# Patient Record
Sex: Female | Born: 1935
Health system: Southern US, Community
[De-identification: ages and names within clinical notes are randomized; demographics above are authoritative.]

## PROBLEM LIST (undated history)

## (undated) DIAGNOSIS — E785 Hyperlipidemia, unspecified: Secondary | ICD-10-CM

## (undated) DIAGNOSIS — Z950 Presence of cardiac pacemaker: Secondary | ICD-10-CM

## (undated) DIAGNOSIS — E039 Hypothyroidism, unspecified: Secondary | ICD-10-CM

## (undated) DIAGNOSIS — I2510 Atherosclerotic heart disease of native coronary artery without angina pectoris: Secondary | ICD-10-CM

## (undated) DIAGNOSIS — R001 Bradycardia, unspecified: Secondary | ICD-10-CM

## (undated) DIAGNOSIS — K219 Gastro-esophageal reflux disease without esophagitis: Secondary | ICD-10-CM

## (undated) HISTORY — DX: Hyperlipidemia, unspecified: E78.5

## (undated) HISTORY — PX: CORONARY STENT INTERVENTION: CATH118234

## (undated) HISTORY — PX: APPENDECTOMY: SHX54

## (undated) HISTORY — PX: CHOLECYSTECTOMY: SHX55

## (undated) HISTORY — PX: OTHER SURGICAL HISTORY: SHX169

---

## 1965-11-16 HISTORY — PX: BREAST BIOPSY: SHX20

## 2002-05-03 ENCOUNTER — Emergency Department (HOSPITAL_COMMUNITY): Admission: EM | Admit: 2002-05-03 | Discharge: 2002-05-03 | Payer: Self-pay | Admitting: Emergency Medicine

## 2002-05-03 ENCOUNTER — Encounter: Payer: Self-pay | Admitting: Emergency Medicine

## 2005-04-21 ENCOUNTER — Ambulatory Visit: Payer: Self-pay | Admitting: Internal Medicine

## 2005-08-29 ENCOUNTER — Emergency Department: Payer: Self-pay | Admitting: Emergency Medicine

## 2005-09-05 ENCOUNTER — Emergency Department: Payer: Self-pay | Admitting: General Practice

## 2006-06-25 ENCOUNTER — Ambulatory Visit: Payer: Self-pay | Admitting: Internal Medicine

## 2006-09-01 ENCOUNTER — Ambulatory Visit: Payer: Self-pay | Admitting: Unknown Physician Specialty

## 2007-06-28 ENCOUNTER — Ambulatory Visit: Payer: Self-pay | Admitting: Internal Medicine

## 2008-01-03 ENCOUNTER — Ambulatory Visit: Payer: Self-pay | Admitting: Otolaryngology

## 2008-06-28 ENCOUNTER — Ambulatory Visit: Payer: Self-pay | Admitting: Internal Medicine

## 2008-07-13 ENCOUNTER — Emergency Department: Payer: Self-pay | Admitting: Emergency Medicine

## 2008-07-21 ENCOUNTER — Emergency Department: Payer: Self-pay | Admitting: Emergency Medicine

## 2008-08-24 ENCOUNTER — Ambulatory Visit: Payer: Self-pay | Admitting: Internal Medicine

## 2009-07-05 ENCOUNTER — Ambulatory Visit: Payer: Self-pay | Admitting: Internal Medicine

## 2009-07-11 ENCOUNTER — Ambulatory Visit: Payer: Self-pay | Admitting: Internal Medicine

## 2009-07-18 ENCOUNTER — Ambulatory Visit: Payer: Self-pay | Admitting: Internal Medicine

## 2009-08-19 ENCOUNTER — Ambulatory Visit: Payer: Self-pay | Admitting: Urology

## 2009-09-13 ENCOUNTER — Ambulatory Visit: Payer: Self-pay | Admitting: Unknown Physician Specialty

## 2009-10-03 ENCOUNTER — Ambulatory Visit: Payer: Self-pay | Admitting: Obstetrics and Gynecology

## 2009-10-14 ENCOUNTER — Ambulatory Visit: Payer: Self-pay | Admitting: Obstetrics and Gynecology

## 2010-07-22 ENCOUNTER — Ambulatory Visit: Payer: Self-pay | Admitting: Internal Medicine

## 2010-11-11 ENCOUNTER — Ambulatory Visit: Payer: Self-pay | Admitting: Internal Medicine

## 2011-07-17 ENCOUNTER — Inpatient Hospital Stay (HOSPITAL_COMMUNITY): Payer: Medicare Other

## 2011-07-17 ENCOUNTER — Inpatient Hospital Stay (HOSPITAL_COMMUNITY)
Admission: EM | Admit: 2011-07-17 | Discharge: 2011-07-19 | DRG: 247 | Disposition: A | Discharge status: Home / Self Care | Payer: Medicare Other | Source: Ambulatory Visit | Attending: Interventional Cardiology | Admitting: Interventional Cardiology

## 2011-07-17 DIAGNOSIS — Z794 Long term (current) use of insulin: Secondary | ICD-10-CM

## 2011-07-17 DIAGNOSIS — I2510 Atherosclerotic heart disease of native coronary artery without angina pectoris: Secondary | ICD-10-CM | POA: Diagnosis present

## 2011-07-17 DIAGNOSIS — F17200 Nicotine dependence, unspecified, uncomplicated: Secondary | ICD-10-CM | POA: Diagnosis present

## 2011-07-17 DIAGNOSIS — E785 Hyperlipidemia, unspecified: Secondary | ICD-10-CM | POA: Diagnosis present

## 2011-07-17 DIAGNOSIS — E039 Hypothyroidism, unspecified: Secondary | ICD-10-CM | POA: Diagnosis present

## 2011-07-17 DIAGNOSIS — D649 Anemia, unspecified: Secondary | ICD-10-CM | POA: Diagnosis present

## 2011-07-17 DIAGNOSIS — I2582 Chronic total occlusion of coronary artery: Secondary | ICD-10-CM | POA: Diagnosis present

## 2011-07-17 DIAGNOSIS — Z79899 Other long term (current) drug therapy: Secondary | ICD-10-CM

## 2011-07-17 DIAGNOSIS — I959 Hypotension, unspecified: Secondary | ICD-10-CM | POA: Diagnosis present

## 2011-07-17 DIAGNOSIS — I2119 ST elevation (STEMI) myocardial infarction involving other coronary artery of inferior wall: Secondary | ICD-10-CM

## 2011-07-17 DIAGNOSIS — E119 Type 2 diabetes mellitus without complications: Secondary | ICD-10-CM | POA: Diagnosis present

## 2011-07-17 DIAGNOSIS — Z7982 Long term (current) use of aspirin: Secondary | ICD-10-CM

## 2011-07-17 LAB — CARDIAC PANEL(CRET KIN+CKTOT+MB+TROPI)
CK, MB: 44.7 ng/mL (ref 0.3–4.0)
Relative Index: 6.7 — ABNORMAL HIGH (ref 0.0–2.5)
Total CK: 666 U/L — ABNORMAL HIGH (ref 7–177)
Troponin I: 15.61 ng/mL (ref <0.30)

## 2011-07-17 LAB — DIFFERENTIAL
Basophils Absolute: 0 10*3/uL (ref 0.0–0.1)
Basophils Relative: 0 % (ref 0–1)
Eosinophils Absolute: 0 10*3/uL (ref 0.0–0.7)
Eosinophils Relative: 0 % (ref 0–5)
Lymphocytes Relative: 7 % — ABNORMAL LOW (ref 12–46)
Lymphs Abs: 0.7 10*3/uL (ref 0.7–4.0)
Monocytes Absolute: 0.5 10*3/uL (ref 0.1–1.0)
Monocytes Relative: 4 % (ref 3–12)
Neutro Abs: 9.7 10*3/uL — ABNORMAL HIGH (ref 1.7–7.7)
Neutrophils Relative %: 89 % — ABNORMAL HIGH (ref 43–77)

## 2011-07-17 LAB — CBC
HCT: 33.6 % — ABNORMAL LOW (ref 36.0–46.0)
Hemoglobin: 11.4 g/dL — ABNORMAL LOW (ref 12.0–15.0)
MCH: 30.8 pg (ref 26.0–34.0)
MCHC: 33.9 g/dL (ref 30.0–36.0)
MCV: 90.8 fL (ref 78.0–100.0)
Platelets: 273 10*3/uL (ref 150–400)
RBC: 3.7 MIL/uL — ABNORMAL LOW (ref 3.87–5.11)
RDW: 12.9 % (ref 11.5–15.5)
WBC: 10.9 10*3/uL — ABNORMAL HIGH (ref 4.0–10.5)

## 2011-07-17 LAB — POCT I-STAT, CHEM 8
BUN: 22 mg/dL (ref 6–23)
Calcium, Ion: 1.11 mmol/L — ABNORMAL LOW (ref 1.12–1.32)
Chloride: 103 mEq/L (ref 96–112)
Creatinine, Ser: 1.1 mg/dL (ref 0.50–1.10)
Glucose, Bld: 567 mg/dL (ref 70–99)
HCT: 33 % — ABNORMAL LOW (ref 36.0–46.0)
Hemoglobin: 11.2 g/dL — ABNORMAL LOW (ref 12.0–15.0)
Potassium: 4.9 mEq/L (ref 3.5–5.1)
Sodium: 132 mEq/L — ABNORMAL LOW (ref 135–145)
TCO2: 15 mmol/L (ref 0–100)

## 2011-07-17 LAB — GLUCOSE, CAPILLARY
Glucose-Capillary: 587 mg/dL (ref 70–99)
Glucose-Capillary: 596 mg/dL (ref 70–99)
Glucose-Capillary: 308 mg/dL — ABNORMAL HIGH (ref 70–99)
Glucose-Capillary: 112 mg/dL — ABNORMAL HIGH (ref 70–99)

## 2011-07-17 LAB — POCT ACTIVATED CLOTTING TIME: Activated Clotting Time: 331 seconds

## 2011-07-17 LAB — MRSA PCR SCREENING: MRSA by PCR: NEGATIVE

## 2011-07-17 LAB — TSH: TSH: 0.884 u[IU]/mL (ref 0.350–4.500)

## 2011-07-18 LAB — HEMOGLOBIN A1C
Hgb A1c MFr Bld: 9 % — ABNORMAL HIGH (ref <5.7)
Mean Plasma Glucose: 212 mg/dL — ABNORMAL HIGH (ref <117)

## 2011-07-18 LAB — CARDIAC PANEL(CRET KIN+CKTOT+MB+TROPI)
CK, MB: 37.3 ng/mL (ref 0.3–4.0)
CK, MB: 17.9 ng/mL (ref 0.3–4.0)
Relative Index: 4.1 — ABNORMAL HIGH (ref 0.0–2.5)
Relative Index: 1.9 (ref 0.0–2.5)
Total CK: 909 U/L — ABNORMAL HIGH (ref 7–177)
Total CK: 932 U/L — ABNORMAL HIGH (ref 7–177)
Troponin I: >25 ng/mL (ref <0.30)
Troponin I: 13.9 ng/mL (ref <0.30)

## 2011-07-18 LAB — BASIC METABOLIC PANEL
BUN: 16 mg/dL (ref 6–23)
CO2: 24 mEq/L (ref 19–32)
Calcium: 7.9 mg/dL — ABNORMAL LOW (ref 8.4–10.5)
Chloride: 103 mEq/L (ref 96–112)
Creatinine, Ser: 0.9 mg/dL (ref 0.50–1.10)
GFR calc Af Amer: >60 mL/min (ref >60)
GFR calc non Af Amer: >60 mL/min (ref >60)
Glucose, Bld: 258 mg/dL — ABNORMAL HIGH (ref 70–99)
Potassium: 3.8 mEq/L (ref 3.5–5.1)
Sodium: 135 mEq/L (ref 135–145)

## 2011-07-18 LAB — CBC
HCT: 30 % — ABNORMAL LOW (ref 36.0–46.0)
Hemoglobin: 10.3 g/dL — ABNORMAL LOW (ref 12.0–15.0)
MCH: 30.7 pg (ref 26.0–34.0)
MCHC: 34.3 g/dL (ref 30.0–36.0)
MCV: 89.3 fL (ref 78.0–100.0)
Platelets: 252 10*3/uL (ref 150–400)
RBC: 3.36 MIL/uL — ABNORMAL LOW (ref 3.87–5.11)
RDW: 13 % (ref 11.5–15.5)
WBC: 12.5 10*3/uL — ABNORMAL HIGH (ref 4.0–10.5)

## 2011-07-18 LAB — GLUCOSE, CAPILLARY
Glucose-Capillary: 219 mg/dL — ABNORMAL HIGH (ref 70–99)
Glucose-Capillary: 255 mg/dL — ABNORMAL HIGH (ref 70–99)
Glucose-Capillary: 300 mg/dL — ABNORMAL HIGH (ref 70–99)
Glucose-Capillary: 252 mg/dL — ABNORMAL HIGH (ref 70–99)

## 2011-07-18 LAB — LIPID PANEL
Cholesterol: 100 mg/dL (ref 0–200)
HDL: 53 mg/dL (ref >39)
LDL Cholesterol: 38 mg/dL (ref 0–99)
Total CHOL/HDL Ratio: 1.9 RATIO
Triglycerides: 47 mg/dL (ref <150)
VLDL: 9 mg/dL (ref 0–40)

## 2011-07-19 LAB — CBC
HCT: 32.8 % — ABNORMAL LOW (ref 36.0–46.0)
Hemoglobin: 11 g/dL — ABNORMAL LOW (ref 12.0–15.0)
MCH: 30.4 pg (ref 26.0–34.0)
MCHC: 33.5 g/dL (ref 30.0–36.0)
MCV: 90.6 fL (ref 78.0–100.0)
Platelets: 224 10*3/uL (ref 150–400)
RBC: 3.62 MIL/uL — ABNORMAL LOW (ref 3.87–5.11)
RDW: 13.4 % (ref 11.5–15.5)
WBC: 5.5 10*3/uL (ref 4.0–10.5)

## 2011-07-19 LAB — BASIC METABOLIC PANEL
BUN: 14 mg/dL (ref 6–23)
CO2: 28 mEq/L (ref 19–32)
Calcium: 8.5 mg/dL (ref 8.4–10.5)
Chloride: 107 mEq/L (ref 96–112)
Creatinine, Ser: 0.58 mg/dL (ref 0.50–1.10)
GFR calc Af Amer: >60 mL/min (ref >60)
GFR calc non Af Amer: >60 mL/min (ref >60)
Glucose, Bld: 114 mg/dL — ABNORMAL HIGH (ref 70–99)
Potassium: 3.8 mEq/L (ref 3.5–5.1)
Sodium: 141 mEq/L (ref 135–145)

## 2011-07-19 LAB — GLUCOSE, CAPILLARY
Glucose-Capillary: 79 mg/dL (ref 70–99)
Glucose-Capillary: 100 mg/dL — ABNORMAL HIGH (ref 70–99)
Glucose-Capillary: 316 mg/dL — ABNORMAL HIGH (ref 70–99)

## 2011-07-21 ENCOUNTER — Telehealth: Payer: Self-pay | Admitting: Cardiology

## 2011-07-21 NOTE — Telephone Encounter (Signed)
Pharmacy calling wanting clarificaiton on Chantix dosage. Please return call to clarify.

## 2011-07-22 NOTE — Telephone Encounter (Signed)
Called Jimmy at pharmacy with Chantix directions.

## 2011-07-22 NOTE — Telephone Encounter (Signed)
Chantix dosing:  0.5 mg Days 1-3.  Day 4-7 0.5 mg bid;  Week 5 to 12:  1 mg bid.

## 2011-07-23 ENCOUNTER — Telehealth: Payer: Self-pay | Admitting: Cardiology

## 2011-07-23 NOTE — Telephone Encounter (Signed)
Pt daughter calling. Pt heart medication, brilinta 90mg  not covered by pt insurance, [employer]. Pt daughter wants to know if there is something pt can substitute that insurance would pay for. Please return call to discuss further.

## 2011-07-23 NOTE — Telephone Encounter (Signed)
According to D/C summary pt is a pt of Dr Dietrich Pates and is to follow up with Dr Tenny Craw.  Will forward to her for review and recommendations.

## 2011-07-23 NOTE — Telephone Encounter (Signed)
Called patient's daughter and she was not there so I spoke with the patient. She states that her insurance company will not cover Brilinta. She has a 1 month supply that she used a coupon to obtain free of charge. Advised her that I would discuss with Dr.Ross about changing to another medication and let her know. Called [facility] and they don't know what will be covered until we actually order medication.

## 2011-07-27 NOTE — H&P (Signed)
NAMERAMAYA, GUILE NO.:  [id number]  MEDICAL RECORD NO.:  [id number]  LOCATION:  2906                         FACILITY:  [facility]  PHYSICIAN:  Pricilla Riffle, MD, FACCDATE OF BIRTH:  November 16, 1934  DATE OF ADMISSION:  07/17/2011 DATE OF DISCHARGE:                             HISTORY & PHYSICAL   IDENTIFICATION:  The patient is a 77 year old who was admitted with chest pain, inferior ST-elevation MI.  HISTORY OF PRESENT ILLNESS:  The patient has no known history of coronary artery disease.  She said this morning about 2-3 hours prior to EMS being called, she developed chest pain, pressure associated with nausea and vomiting.  EMS was called.  EKG showed inferior ST elevation. She was transported to [facility].  En route, she was given 700 mL of fluid IV as well as Zofran.  She was cool and clammy per the report of EMS.  Blood pressure palpable.  Currently, status post intervention, she denies chest pain.  Breathing is okay.  She says prior to today, she has not had any episodes of chest pain.  She is not that active.  ALLERGIES:  DARVON.  MEDICATIONS AT HOME:  The patient on: 1. Lantus 16 units, it appears, at bedtime (records pending from Dr.     Judithann Sheen' office). 2. Pravastatin 40. 3. Synthroid 75 mcg. 4. Aspirin if she remembers. 5. Claritin 10 mg 1 time per day.  PAST MEDICAL HISTORY: 1. Diabetes for 30 years.  She reports glucose is relatively good     control. 2. Hypothyroidism. 3. Dyslipidemia. 4. Pain in the legs.  SOCIAL HISTORY:  The patient is married.  She smokes a pack per day x30 years.  Does not drink.  FAMILY HISTORY:  Significant for coronary artery disease in the mother.  REVIEW OF SYSTEMS:  No fevers, chills, occasional headaches that her bad.  No shortness of breath.  No PND.  Yesterday, her glucose was running in the 500s.  She did not feel good.  GU:  Negative.  GI: Nausea and vomiting.  No diarrhea.  Bowels moving okay.   Otherwise, all systems reviewed and negative to the above problem except as noted above.  PHYSICAL EXAMINATION:  GENERAL:  On exam, the patient is in no acute distress, status post intervention. VITAL SIGNS:  Blood pressure 95-110 systolic/60s, pulse 70s and regular (sinus rhythm), temperature is 98, O2 sat on 2 L of 100%. HEENT:  Normocephalic, atraumatic.  EOMI, PERRL. NECK:  JVP is normal.  No thyromegaly, no bruits. LUNGS:  Clear to auscultation without rales or wheezes anteriorly. CARDIAC:  Regular rate and rhythm.  S1, S2.  No S3, S4, or murmurs. ABDOMEN:  Supple, nontender.  Normal bowel sounds.  No hepatomegaly. EXTREMITIES:  Good distal pulses throughout.  No lower extremity edema. NEUROLOGIC:  Alert and oriented x3.  Cranial nerves II through XII grossly intact.  Chest x-ray pending.  A 12-lead EKG, sinus bradycardia.  2:1 AV block. ST elevation in leads II, III and F.  Q-waves V1, V2.  EKG #2, sinus rhythm, 78.  Q-waves V1, V2; ST depression V5, V6.  No R-waves present inferiorly.  LABORATORY DATA:  Pending.  IMPRESSION: 1.  A 77 year old woman with inferior ST-elevation myocardial     infarction, now status post Percutaneous transluminal coronary     angioplasty with Promus stent to the right coronary artery.  She     had a left main that was widely patent.  Circumflex was a large     vessel with mild irregularities.  Left anterior descending artery     was a medium-to-large vessel, irregularities.  First diagonal was     large and widely patent, second diagonal and third diagonal were     small, patent.  Right coronary artery again was occluded     proximally.  It was a dominant vessel when opened.  Patent ductus     arteriosus had a 25% ostial lesion, left ventricular ejection     fraction was normal at 60%.  The patient underwent percutaneous     transluminal coronary angioplasty, had aspiration catheter placed,     Promus stent was placed.  Plan was for aspirin and  Brilinta for 1     year.  Hold other meds now.  Follow blood pressure, fluids, and     dopamine as needed.  We will begin other meds as blood pressure     tolerates. 2. Dyslipidemia.  We will start Crestor 40, check lipids in a.m. 3. Diabetes get records from Dr. Judithann Sheen, check hemoglobin A1c, sliding     scale insulin moderate, begin 16 units Lantus p.m. 4. Hypothyroidism.  Check TSH.  Continue Synthroid. 5. Tobacco.  Counseled on cessation. 6. We will begin Protonix empirically.     Pricilla Riffle, MD, [facility]     PVR/MEDQ  D:  07/17/2011  T:  07/17/2011  Job:  [id number]  Electronically Signed by Dietrich Pates MD [facility] on 07/27/2011 06:09:50 AM

## 2011-07-27 NOTE — H&P (Signed)
  NAMEBRAD, MCGAUGHY NO.:  [id number]  MEDICAL RECORD NO.:  [id number]  LOCATION:  2906                         FACILITY:  [facility]  PHYSICIAN:  Pricilla Riffle, MD, FACCDATE OF BIRTH:  [birth year]  DATE OF ADMISSION:  07/17/2011 DATE OF DISCHARGE:                             HISTORY & PHYSICAL   IDENTIFICATION:  The patient is a 77 year old with no known history of coronary artery disease, presents with ST elevation MI.  HISTORY OF PRESENT ILLNESS:  The patient reported this morning about chest pain that began 2-3 hours ago.  EMS was called.  On their arrival, found on EKG to have ST elevation inferiorly.  Noted to have nausea and vomiting.  Pulse was palpable.  She was given Zofran IV, IV fluids (700 mL), and aspirin, and transported to [facility] for further treatment.  ALLERGIES:  Darvon.  MEDICATIONS:  Per EMS, insulin, dose unknown; pravastatin 40; Synthroid 75 mcg.  PAST MEDICAL HISTORY: 1. Diabetes. 2. Hypothyroidism. 3. Dyslipidemia.  SOCIAL HISTORY:  Not obtained as the patient undergoing catheterization.  FAMILY HISTORY:  Not obtained.  REVIEW OF SYSTEMS:  Not reviewed, noted above positive though.  PHYSICAL EXAMINATION:  Dictation ended at this point.     Pricilla Riffle, MD, [facility]     PVR/MEDQ  D:  07/17/2011  T:  07/17/2011  Job:  [id number]  Electronically Signed by Dietrich Pates MD [facility] on 07/27/2011 06:09:46 AM

## 2011-07-27 NOTE — Telephone Encounter (Signed)
Madeline [name] should be covered.  Started in setting of STEMI with PCI/Stent placement.   Get her samples for now.  Then need insurance info. She cannot miss doses.

## 2011-08-04 NOTE — Telephone Encounter (Signed)
Called patient's insurance company at 1 [phone] ID# [id number] and requested a pre authorization form for Brilinta 90mg  BID. They will fax form for MD to complete and then we will send back to them.

## 2011-08-11 ENCOUNTER — Ambulatory Visit: Payer: No Typology Code available for payment source | Admitting: Internal Medicine

## 2011-08-12 ENCOUNTER — Encounter: Payer: Self-pay | Admitting: Internal Medicine

## 2011-08-12 NOTE — Cardiovascular Report (Signed)
[name], [name] NO.:  [id number]  MEDICAL RECORD NO.:  [id number]  LOCATION:  2906                         FACILITY:  [facility]  PHYSICIAN:  Corky Crafts, MDDATE OF BIRTH:  01-25-1934  DATE OF PROCEDURE:  07/17/2011 DATE OF DISCHARGE:                           CARDIAC CATHETERIZATION   PRIMARY CARE PHYSICIAN:  Dr. Judithann Sheen.  PRIMARY CARDIOLOGIST:  Pricilla Riffle, MD, [facility]  PROCEDURES PERFORMED: 1. Left heart catheterization. 2. Left ventriculogram. 3. Coronary angiogram.  OPERATOR:  Corky Crafts, MD  INDICATIONS:  Inferior ST elevation MI.  PROCEDURE NARRATIVE:  The patient was brought emergently to the cath lab due to the inferior ST elevation.  She was prepped and draped in usual sterile fashion.  Her right groin was infiltrated with 1% lidocaine.  A 6-French sheath was placed into the right common femoral vein using the modified Seldinger technique.  A 6-French sheath was placed into the right common femoral artery using modified Seldinger technique.  Left coronary artery angiography was performed using JL-4.0 pigtail catheter. The catheter was advanced in to the vessel ostium under fluoroscopic guidance.  Digital angiography was performed in multiple projections using hand injection of contrast.  Right coronary artery angiography was performed using a JR-4 guiding catheter.  Angiomax was used for anticoagulation.  The PCI was then performed.  Please see below for details.  After the PCI, a pigtail catheter was advanced in to the left ventricle under fluoroscopic guidance.  Power injection of contrast was performed in the RAO projection to image the left ventricle.  Catheter was pulled back under continuous hemodynamic pressure monitoring.  A femoral angiogram was performed but her femoral artery was not suitable for closure.  Manual compression will be used for hemostasis.  FINDINGS: 1. The left main is widely patent. 2. The left  circumflex is a large vessel.  There are mild luminal     irregularities.  The OM-1, OM-2, and OM-3 are all small vessels but     patent.  There is a fourth OM which is medium sized and widely     patent. 3. Left anterior descending is a medium-to-large vessel which reached     the apex.  There are mild luminal irregularities.  The first     diagonal is a large vessel which appears widely patent.  There are     2 other diagonals which are small but patent. 4. The right coronary artery is occluded proximally.  Post-PCI, we     were able to see that this was a large dominant vessel with mild     irregularities.  There were dual PDAs, the more proximal branch had     an ostial 25% stenosis. 5. Left ventriculogram after the PCI showed normal ventricular     function with an LVEF of 60-65%.  HEMODYNAMICS:  Left ventricular pressure 80/0 with an LVEDP of 6 mmHg. Aortic pressure 74/26 with a mean aortic pressure of 42.  PCI NARRATIVE:  JR-4 guiding catheter was used.  A Prowater wire was used.  Dopamine was started for hypotension, this was started at 5 mcg, was decreased to 2.5 mcg.  Her blood pressure stabilized during the procedure.  A Prowater wire was used to cross the lesion and aspiration catheter was used and there was successful removal of thrombus.  TIMI-3 flow was restored.  A 2.0 x 15 balloon was inflated to 10 atmospheres. A 2.5 x 20 Promus stent was then deployed in the disease proximal RCA to 16 atmospheres.  The stent was postdilated with a 2.75 x 15 [place] Quantum Apex balloon, inflated at 18 atmospheres twice.  There was no residual stenosis.  Initial TIMI-0 flow was improved to TIMI-3 flow.  Lesion length was 16 mm.  We were able to stop the dopamine by the end of the case as her blood pressure had stabilized.  Angiomax was continued at 0.25 mg for 2 hours.  IMPRESSION: 1. Acute inferior ST elevation myocardial infarction. 2. Normal left ventricular  function.  RECOMMENDATIONS:  Continue aspirin and Brilinta for at least a year.  If her blood sugars are greater than 500, we will get units of regular insulin to try to get her blood sugar down.  She needs aggressive secondary prevention including smoking cessation.     Corky Crafts, MD     JSV/MEDQ  D:  07/17/2011  T:  07/17/2011  Job:  [id number]  Electronically Signed by Lance Muss MD on 08/12/2011 01:11:19 PM

## 2011-08-14 ENCOUNTER — Ambulatory Visit (INDEPENDENT_AMBULATORY_CARE_PROVIDER_SITE_OTHER): Payer: Medicare Other | Admitting: Internal Medicine

## 2011-08-14 ENCOUNTER — Encounter: Payer: Self-pay | Admitting: Internal Medicine

## 2011-08-14 DIAGNOSIS — E785 Hyperlipidemia, unspecified: Secondary | ICD-10-CM

## 2011-08-14 DIAGNOSIS — I2510 Atherosclerotic heart disease of native coronary artery without angina pectoris: Secondary | ICD-10-CM | POA: Insufficient documentation

## 2011-08-14 DIAGNOSIS — I6529 Occlusion and stenosis of unspecified carotid artery: Secondary | ICD-10-CM

## 2011-08-14 DIAGNOSIS — Z720 Tobacco use: Secondary | ICD-10-CM

## 2011-08-14 DIAGNOSIS — F17200 Nicotine dependence, unspecified, uncomplicated: Secondary | ICD-10-CM

## 2011-08-14 NOTE — Assessment & Plan Note (Signed)
Good control

## 2011-08-14 NOTE — Assessment & Plan Note (Signed)
Counselled on stopping.

## 2011-08-14 NOTE — Assessment & Plan Note (Signed)
S/p PCI with DEs to RCA.  Will work at [employer], [employer] approved. Referral for cardiac rehab.  I think this is vital as patient would like to get back to caring for 2 young children.

## 2011-08-14 NOTE — Patient Instructions (Signed)
Your physician has requested that you have a carotid duplex. This test is an ultrasound of the carotid arteries in your neck. It looks at blood flow through these arteries that supply the brain with blood. Allow one hour for this exam. There are no restrictions or special instructions.  Your physician wants you to follow-up in:January 2013 with Dr.Ross You will receive a reminder letter in the mail two months in advance. If you don't receive a letter, please call our office to schedule the follow-up appointment.

## 2011-08-14 NOTE — Progress Notes (Signed)
HPI  Patient is a 77 year old who was recently admitted to [facility] with inferior STEMI.  She had occlusion of the RCA and underwent PTCA with DES to the RCA.  It was a right dominant system with dual PDAs.  Other vessels had irregularities.  She was d/cd home on Brilinta. Since D/C no chest pains.  Breathing is OK.  Walking 2x per day. Trying to quit.  6 cigs yesterday. LDL was 38 in hospital. Has dry cough, had it before coming into hospital. Allergies not on file  Current Outpatient Prescriptions  Medication Sig Dispense Refill  . aspirin 81 MG tablet Take 81 mg by mouth daily.        . citalopram (CELEXA) 20 MG tablet Take 20 mg by mouth daily.        . insulin aspart (NOVOLOG) 100 UNIT/ML injection Inject into the skin 3 (three) times daily before meals. 5 to 8 units       . insulin glargine (LANTUS) 100 UNIT/ML injection Inject 14 Units into the skin at bedtime.        Marland Kitchen levothyroxine (SYNTHROID, LEVOTHROID) 75 MCG tablet Take 75 mcg by mouth daily.        Marland Kitchen lisinopril (PRINIVIL,ZESTRIL) 5 MG tablet Take 5 mg by mouth daily.        Marland Kitchen loratadine (CLARITIN) 10 MG tablet Take 10 mg by mouth daily.        . metoprolol tartrate (LOPRESSOR) 25 MG tablet Take 25 mg by mouth 2 (two) times daily.        . nitroGLYCERIN (NITROSTAT) 0.4 MG SL tablet Place 0.4 mg under the tongue every 5 (five) minutes as needed.        . pantoprazole (PROTONIX) 40 MG tablet Take 40 mg by mouth daily.        . pravastatin (PRAVACHOL) 40 MG tablet Take 40 mg by mouth daily.        . Ticagrelor (BRILINTA) 90 MG TABS tablet Take 90 mg by mouth 2 (two) times daily.          Past Medical History  Diagnosis Date  . Diabetes mellitus   . Hyperthyroidism   . Dyslipidemia     No past surgical history on file.  No family history on file.  History   Social History  . Marital Status: Married    Spouse Name: N/A    Number of Children: N/A  . Years of Education: N/A   Occupational History  . Not on file.     Social History Main Topics  . Smoking status: Current Everyday Smoker  . Smokeless tobacco: Not on file  . Alcohol Use: Not on file  . Drug Use: Not on file  . Sexually Active: Not on file   Other Topics Concern  . Not on file   Social History Narrative  . No narrative on file    Review of Systems:  All systems reviewed.  They are negative to the above problem except as previously stated.  Vital Signs: BP 145/66  Pulse 53  Ht 5\' 2"  (1.575 m)  Wt 110 lb (49.896 kg)  BMI 20.12 kg/m2  Physical Exam  Patient in NAD.  HEENT:  Normocephalic, atraumatic. EOMI, PERRLA.  Neck: JVP is normal. No thyromegaly. No bruits.  Lungs: clear to auscultation. No rales no wheezes.  Heart: Regular rate and rhythm. Normal S1, S2. No S3.   No significant murmurs. PMI not displaced.  Abdomen:  Supple, nontender. Normal  bowel sounds. No masses. No hepatomegaly.  Extremities:   Good distal pulses throughout. No lower extremity edema.  Musculoskeletal :moving all extremities.  Neuro:   alert and oriented x3.  CN II-XII grossly intact.  EKG:  Sinus bradycardia.  53 bpm. LVH.  NOnspecific ST changes.  Assessment and Plan:

## 2011-08-27 NOTE — Discharge Summary (Signed)
NAMEJAMESHIA, [name] NO.:  [id number]  MEDICAL RECORD NO.:  [id number]  LOCATION:  3703                         FACILITY:  [facility]  PHYSICIAN:  Rollene Rotunda, MD, FACCDATE OF BIRTH:  27-Oct-1934  DATE OF ADMISSION:  07/17/2011 DATE OF DISCHARGE:  07/19/2011                              DISCHARGE SUMMARY   PRIMARY CARDIOLOGIST:  Pricilla Riffle, MD, [facility]  DISCHARGE DIAGNOSIS: 1. Status post acute inferior ST-elevation myocardial infarction.     a.     Status post emergent drug-eluting stenting of 100% proximal      right coronary artery.     b.     Residual nonobstructive coronary artery disease.     c.     Normal left ventricular function (EF 60-65%)  SECONDARY DIAGNOSES: 1. Diabetes mellitus. 2. Dyslipidemia. 3. Hypothyroidism.  REASON FOR ADMISSION:  [name] is a 77 year old female, with no known history of heart disease, who presented emergently to the cath lab, with inferior ST-elevation myocardial infarction.  HOSPITAL COURSE:  The patient underwent successful coronary angiography and emergent percutaneous intervention, by Dr. Eldridge Dace, with placement of a PROMUS drug-eluding stent, for treatment of a 100% occluded proximal right coronary artery.  There were no noted complications.  Residual anatomy yielded nonobstructive CAD.  Left ventricular function was within normal limits.  The patient did require initial treatment with dopamine for hypotension.  Subsequent hospital course was essentially benign, and the patient initiated cardiac rehab phase I.  Medication adjustments notable for initiation of Brilinta, to be continued  for at least 1 year.  The patient was also noted to have bilateral carotid bruits on examination, with recommendation to pursue outpatient carotid Dopplers.  A 2-D echo was  also initially ordered, but owing to scheduling conflicts could not be  performed prior to discharge.  The patient was anxious to leave, given  that it was her birthday.  Also of note, the patient did seem amenable to wanting to stop smoking, and recommendation was for her to be provided with a prescription for Chantix,  at time of discharge.  DISCHARGE LABORATORY DATA:  Sodium 141, potassium 3.8, BUN 14, creatinine 0.6.  WBC 5.5, hemoglobin 11, hematocrit 33, (MCV 91), platelets 224.  Hemoglobin A1c 9.0.  OUTSTANDING LABORATORY DATA:  Peak troponin I greater than 25.  Lipid profile:  Total cholesterol 100, triglyceride 47, HDL 53, and LDL 38. TSH 0.9.  ADMISSION CHEST X-RAY:  No acute cardiopulmonary process.  DISPOSITION:  Stable.  FOLLOWUP:  Dr. Huston Foley in 2 weeks, arrangements to be made through our office.  DISCHARGE HOME MEDICATIONS: 1. Aspirin 81 daily. 2. Brilinta 90 mg b.i.d. 3. Metoprolol 25 mg b.i.d. 4. Lisinopril 5 mg daily. 5. Protonix 40 daily. 6. Lantus insulin 14 units at bedtime. 7. Pravastatin 40 daily. 8. Celexa 20 daily. 9. Claritin 10 daily. 10.Synthroid 0.075 daily. 11.Chantix as directed.  DURATION OF DISCHARGE ENCOUNTER:  Greater than 30 minutes, including physician time.     Gene Serpe, PA-C   ______________________________ Rollene Rotunda, MD, [facility]    GS/MEDQ  D:  07/19/2011  T:  07/20/2011  Job:  [id number]  cc:   Dr. Judithann Sheen  Electronically Signed by  GENE SERPE PA-C on 07/21/2011 02:32:59 PM Electronically Signed by Rollene Rotunda MD [facility] on 08/27/2011 01:33:52 PM

## 2011-09-02 ENCOUNTER — Telehealth: Payer: Self-pay | Admitting: Internal Medicine

## 2011-09-02 NOTE — Telephone Encounter (Signed)
Pt calling re samples of Brilinta and will run out in 2 weeks, will rx be called in? uses [facility] [address] in [place]

## 2011-09-04 NOTE — Telephone Encounter (Signed)
Called patient and advised that [employer] has approved the Brilinta medication per [employer]. Her copay will be 39.00 per month. Medication will be ready for pick up Monday afternoon.

## 2011-09-04 NOTE — Telephone Encounter (Signed)
Per pharm. Medication approved.  See other note.

## 2011-09-09 ENCOUNTER — Other Ambulatory Visit: Payer: Self-pay | Admitting: Cardiology

## 2011-09-09 DIAGNOSIS — I6529 Occlusion and stenosis of unspecified carotid artery: Secondary | ICD-10-CM

## 2011-09-10 ENCOUNTER — Encounter (INDEPENDENT_AMBULATORY_CARE_PROVIDER_SITE_OTHER): Payer: Medicare Other | Admitting: Cardiology

## 2011-09-10 DIAGNOSIS — I6529 Occlusion and stenosis of unspecified carotid artery: Secondary | ICD-10-CM

## 2011-09-10 DIAGNOSIS — R0989 Other specified symptoms and signs involving the circulatory and respiratory systems: Secondary | ICD-10-CM

## 2011-10-15 ENCOUNTER — Telehealth: Payer: Self-pay | Admitting: Internal Medicine

## 2011-10-15 NOTE — Telephone Encounter (Signed)
Refill needed for  brilanta needs written rx

## 2011-10-22 MED ORDER — TICAGRELOR 90 MG PO TABS: 90.0000 mg | ORAL_TABLET | Freq: Two times a day (BID) | ORAL | Status: DC

## 2011-10-22 NOTE — Telephone Encounter (Signed)
Called patient and she advised me that she needs a written script for Brilinta to be mailed to her home so she can try to get into the medication program. Advised will mail out tomorrow.

## 2011-10-28 ENCOUNTER — Encounter: Payer: Self-pay | Admitting: Internal Medicine

## 2011-11-23 ENCOUNTER — Telehealth: Payer: Self-pay | Admitting: *Deleted

## 2011-11-23 NOTE — Telephone Encounter (Signed)
Called patient and left message on voice mail that the Brilinta 90mg   #180 has arrived and she can pick it up at the front desk at this office. She was advised to call me when she needs a refill about 2 weeks prior to her running out of the medication.

## 2011-12-14 ENCOUNTER — Encounter: Payer: Self-pay | Admitting: Internal Medicine

## 2011-12-14 ENCOUNTER — Ambulatory Visit (INDEPENDENT_AMBULATORY_CARE_PROVIDER_SITE_OTHER): Payer: Medicare Other | Admitting: Internal Medicine

## 2011-12-14 DIAGNOSIS — E785 Hyperlipidemia, unspecified: Secondary | ICD-10-CM

## 2011-12-14 DIAGNOSIS — F17200 Nicotine dependence, unspecified, uncomplicated: Secondary | ICD-10-CM

## 2011-12-14 DIAGNOSIS — I2510 Atherosclerotic heart disease of native coronary artery without angina pectoris: Secondary | ICD-10-CM

## 2011-12-14 DIAGNOSIS — Z720 Tobacco use: Secondary | ICD-10-CM

## 2011-12-14 NOTE — Progress Notes (Signed)
HPI Patient is a 78 year old who was recently admitted to [facility] with inferior STEMI. She had occlusion of the RCA and underwent PTCA with DES to the RCA. It was a right dominant system with dual PDAs. Other vessels had irregularities. She was d/cd home on Brilinta.   Trying to quit. 6 cigs yesterday.  LDL was 38 in hospital.   The other night had an episode of chest pressure in bed.  Went to L arm  Lasted a few minutes  None since Doing household activities.  No pain with this.  Breathing is stable.   Allergies  Allergen Reactions  . Darvon   . Sulfa Antibiotics     Current Outpatient Prescriptions  Medication Sig Dispense Refill  . aspirin 81 MG tablet Take 81 mg by mouth daily.        . citalopram (CELEXA) 20 MG tablet Take 20 mg by mouth daily.        . insulin aspart (NOVOLOG) 100 UNIT/ML injection Inject into the skin 3 (three) times daily before meals. 5 to 8 units       . insulin glargine (LANTUS) 100 UNIT/ML injection Inject 14 Units into the skin at bedtime.        Marland Kitchen levothyroxine (SYNTHROID, LEVOTHROID) 75 MCG tablet Take 75 mcg by mouth daily.        Marland Kitchen lisinopril (PRINIVIL,ZESTRIL) 5 MG tablet Take 5 mg by mouth daily.        . metoprolol tartrate (LOPRESSOR) 25 MG tablet Take 25 mg by mouth 2 (two) times daily.        . nitroGLYCERIN (NITROSTAT) 0.4 MG SL tablet Place 0.4 mg under the tongue every 5 (five) minutes as needed.        . pantoprazole (PROTONIX) 40 MG tablet Take 40 mg by mouth daily.        . pravastatin (PRAVACHOL) 40 MG tablet Take 40 mg by mouth daily.        . Ticagrelor (BRILINTA) 90 MG TABS tablet Take 1 tablet (90 mg total) by mouth 2 (two) times daily.  60 tablet  11    Past Medical History  Diagnosis Date  . Diabetes mellitus   . Hyperthyroidism   . Dyslipidemia     No past surgical history on file.  No family history on file.  History   Social History  . Marital Status: Married    Spouse Name: N/A    Number of Children: N/A  .  Years of Education: N/A   Occupational History  . Not on file.   Social History Main Topics  . Smoking status: Current Everyday Smoker  . Smokeless tobacco: Not on file  . Alcohol Use: Not on file  . Drug Use: Not on file  . Sexually Active: Not on file   Other Topics Concern  . Not on file   Social History Narrative  . No narrative on file    Review of Systems:  All systems reviewed.  They are negative to the above problem except as previously stated.  Vital Signs: BP 134/64  Pulse 54  Ht 5\' 2"  (1.575 m)  Wt 117 lb (53.071 kg)  BMI 21.40 kg/m2  Physical Exam Patient is in NAD HEENT:  Normocephalic, atraumatic. EOMI, PERRLA.  Neck: JVP is normal. No bruits.  Lungs: clear to auscultation. No rales no wheezes.  Heart: Regular rate and rhythm. Normal S1, S2. No S3.   No significant murmurs. PMI not displaced.  Abdomen:  Supple, nontender. Normal bowel sounds. No masses. No hepatomegaly.  Extremities:   Good distal pulses throughout. No lower extremity edema.  Musculoskeletal :moving all extremities.  Neuro:   alert and oriented x3.  CN II-XII grossly intact.  EKG;  SR.  61 bpm.  LVH with repolarization abnormality.  Assessment and Plan:  \

## 2011-12-14 NOTE — Patient Instructions (Signed)
Your physician wants you to follow-up in: Sept/OCt. 2013 You will receive a reminder letter in the mail two months in advance. If you don't receive a letter, please call our office to schedule the follow-up appointment.

## 2011-12-15 NOTE — Assessment & Plan Note (Signed)
Keep on statin. 

## 2011-12-15 NOTE — Assessment & Plan Note (Signed)
I am not convinced CP in bed represents angina.  Keep on same regimen.

## 2011-12-15 NOTE — Assessment & Plan Note (Signed)
Counselled on quitting. 

## 2012-02-05 ENCOUNTER — Other Ambulatory Visit: Payer: Self-pay | Admitting: Cardiology

## 2012-02-09 ENCOUNTER — Other Ambulatory Visit: Payer: Self-pay | Admitting: Cardiology

## 2012-02-19 ENCOUNTER — Other Ambulatory Visit: Payer: Self-pay | Admitting: Cardiology

## 2012-02-19 ENCOUNTER — Telehealth: Payer: Self-pay | Admitting: Internal Medicine

## 2012-02-19 NOTE — Telephone Encounter (Signed)
Pt needs to reorder brilinta , pls call when it comes in [phone]

## 2012-02-19 NOTE — Telephone Encounter (Signed)
I have called and been transferred all over the place  I do not know where to call as there is no documentation of where this medication is coming from.  I have called [facility] and [facility].     I called the patient and let her know I would send this to Dr Charlott Rakes nurse.  She will be back on Thurs and will know where to send.  She has 1 bottle left.  The pt could not find a number to call either.

## 2012-02-25 NOTE — Telephone Encounter (Signed)
Marden Noble comes from [employer] Patient Assistance Program. Will have Dr.Ross sign this form on 4/12 and will fax back to [phone].

## 2012-02-26 NOTE — Telephone Encounter (Signed)
Called patient to let her know that Dr.Ross signed form for Brilinta and it was faxed to [facility]. Will call her when medication arrives.

## 2012-03-14 ENCOUNTER — Telehealth: Payer: Self-pay | Admitting: *Deleted

## 2012-03-14 ENCOUNTER — Other Ambulatory Visit: Payer: Self-pay | Admitting: *Deleted

## 2012-03-14 NOTE — Telephone Encounter (Signed)
Pt needs brilanta refilled--advised i would reorder--left samples at front desk for pt to pic up--nt

## 2012-03-14 NOTE — Telephone Encounter (Signed)
I called [employer] at [phone] and was advised by a customer service agent that patient's application expired on 11/16/2011 and that she was sent a letter that notified her of this information. They will mail a new application to her home address that she needs to complete.  I called the patient and explained that she had to reapply. She verbalized understanding.

## 2012-03-14 NOTE — Telephone Encounter (Signed)
Pt calling stating she's running out of brilanta and has not received her order from [employer]--she states she has enough to last until 03/17/12--advised i would put some samples  at front desk and Madeline [name] states she ordered this med on 02/26/12, but it has not arrived--Madeline [name] states she will look at the paperwork she faxed [employer] and figure out problem--nt

## 2012-03-22 ENCOUNTER — Telehealth: Payer: Self-pay | Admitting: Internal Medicine

## 2012-03-22 NOTE — Telephone Encounter (Signed)
Called patient back. She states that she did send in application for Brilinta program but has not heard back yet. Samples of Brilinta left for her to pick up at the front desk.

## 2012-03-22 NOTE — Telephone Encounter (Signed)
Please return call to patient regarding Brulenta Samples. She can be reached at [phone].

## 2012-04-04 ENCOUNTER — Telehealth: Payer: Self-pay | Admitting: Internal Medicine

## 2012-04-04 NOTE — Telephone Encounter (Signed)
New msg Pt was calling about med. Brilinta. She had some questions. Please call

## 2012-04-04 NOTE — Telephone Encounter (Signed)
Spoke with pt, she is waiting for supply from the company. Pt made aware not a the front desk, she still has enough for about one week. Will forward for dr Tenny Craw nurse review. The pt will call back if she needs samples prior to drugs getting here.

## 2012-04-14 NOTE — Telephone Encounter (Signed)
Called [employer] and rep at [phone] advised me that her new application is being processed and that medication should be sent to our office very soon. I called the patient and left a message on her voice mail with this information.

## 2012-04-25 ENCOUNTER — Telehealth: Payer: Self-pay | Admitting: Internal Medicine

## 2012-04-25 NOTE — Telephone Encounter (Signed)
New Problem:    Patient called in wanting to know if there were any samples of Brilinta available for her to have.  Please call back.

## 2012-04-25 NOTE — Telephone Encounter (Signed)
24 days of samples were left at the front desk.  Pt was notified.

## 2012-04-27 MED ORDER — TICAGRELOR 90 MG PO TABS: 90.0000 mg | ORAL_TABLET | Freq: Two times a day (BID) | ORAL | Status: DC

## 2012-04-27 NOTE — Telephone Encounter (Signed)
New msg Pt assistance program needs rx to process brilinta fax number is [phone]

## 2012-04-27 NOTE — Telephone Encounter (Signed)
RX printed and faxed to number given

## 2012-04-27 NOTE — Telephone Encounter (Signed)
Left message that RX was faxed

## 2012-05-09 ENCOUNTER — Ambulatory Visit: Payer: Self-pay | Admitting: Internal Medicine

## 2012-05-13 ENCOUNTER — Telehealth: Payer: Self-pay | Admitting: *Deleted

## 2012-05-13 NOTE — Telephone Encounter (Signed)
Called patient to follow up on Brilinta medication and she advised me that the company has mailed 3 bottles to her home.

## 2012-06-06 ENCOUNTER — Telehealth: Payer: Self-pay | Admitting: Internal Medicine

## 2012-06-06 NOTE — Telephone Encounter (Signed)
Walk In pt Form " pt Dropped Off [employer]&[place] papers for Completion" Sent to [facility]

## 2012-06-13 ENCOUNTER — Telehealth: Payer: Self-pay | Admitting: *Deleted

## 2012-06-13 NOTE — Telephone Encounter (Signed)
LMOM that [employer] form is ready to pick up and is left out front in a file.

## 2012-06-28 ENCOUNTER — Telehealth: Payer: Self-pay | Admitting: Internal Medicine

## 2012-06-28 NOTE — Telephone Encounter (Signed)
Patient called because she is sick at her stomach. Pt took her BP left arm 121/61, pulse 56 beats/minute right arm BP 123/66, pulse 58 beats/minute. Patient is aware that her BP and pulse is normal, she needs to call her PCP for an appointment for further evaluation. Pt verbalized understanding.

## 2012-06-28 NOTE — Telephone Encounter (Signed)
New msg Pt wants to talk to you about her BP 121/61 hr 56. Please call

## 2012-08-25 ENCOUNTER — Ambulatory Visit (INDEPENDENT_AMBULATORY_CARE_PROVIDER_SITE_OTHER): Payer: Medicare Other | Admitting: Internal Medicine

## 2012-08-25 ENCOUNTER — Encounter: Payer: Self-pay | Admitting: Internal Medicine

## 2012-08-25 VITALS — BP 148/60 | HR 62 | Ht 63.0 in | Wt 126.0 lb

## 2012-08-25 DIAGNOSIS — I25810 Atherosclerosis of coronary artery bypass graft(s) without angina pectoris: Secondary | ICD-10-CM

## 2012-08-25 MED ORDER — METOPROLOL TARTRATE 25 MG PO TABS: 25.0000 mg | ORAL_TABLET | Freq: Two times a day (BID) | ORAL | Status: DC

## 2012-08-25 NOTE — Patient Instructions (Signed)
Please have fasting lab work drawn with your primary care doctor.  Lipid, cbc, bmp. They will fax [place] a copy of your results    Your physician wants you to follow-up in: 8 months with Dr. Tenny Craw. You will receive a reminder letter in the mail two months in advance. If you don't receive a letter, please call our office to schedule the follow-up appointment.  Your carotid duplex is this month October 29th at 12:00.  Please arrive to our office 15 minutes earlier  After you finish the last 2 bottles of your Brilinta. YOU MAY DISCONTINUE IT.

## 2012-08-25 NOTE — Progress Notes (Signed)
HPI Patient is a 78  year old who was  admitted last winter to [facility] with inferior STEMI. She had occlusion of the RCA and underwent PTCA with DES to the RCA. It was a right dominant system with dual PDAs. Other vessels had irregularities. She was d/cd home on Brilinta.  LDL was 38 in hospital.  I saw her in clinic in January.  Since I saw her she denies CP  Breathing is [employer].   Fell on stairs  Has bump on head.  No syncope. Missed step. Takes care of grandkids  (1, 2, 4) Allergies  Allergen Reactions  . Darvon   . Sulfa Antibiotics     Current Outpatient Prescriptions  Medication Sig Dispense Refill  . aspirin 81 MG tablet Take 81 mg by mouth daily.        . citalopram (CELEXA) 20 MG tablet Take 20 mg by mouth daily.        . insulin aspart (NOVOLOG) 100 UNIT/ML injection Inject into the skin 3 (three) times daily before meals. 5 to 8 units       . insulin glargine (LANTUS) 100 UNIT/ML injection Inject 14 Units into the skin at bedtime.        Marland Kitchen levothyroxine (SYNTHROID, LEVOTHROID) 75 MCG tablet Take 75 mcg by mouth daily.        Marland Kitchen lisinopril (PRINIVIL,ZESTRIL) 5 MG tablet TAKE ONE TABLET BY MOUTH EVERY DAY  30 tablet  6  . metoprolol tartrate (LOPRESSOR) 25 MG tablet TAKE ONE TABLET BY MOUTH TWICE DAILY  60 tablet  6  . nitroGLYCERIN (NITROSTAT) 0.4 MG SL tablet Place 0.4 mg under the tongue every 5 (five) minutes as needed.        . pantoprazole (PROTONIX) 40 MG tablet TAKE ONE TABLET BY MOUTH EVERY DAY  30 tablet  6  . pravastatin (PRAVACHOL) 40 MG tablet Take 40 mg by mouth daily.        . Ticagrelor (BRILINTA) 90 MG TABS tablet Take 1 tablet (90 mg total) by mouth 2 (two) times daily.  180 tablet  3    Past Medical History  Diagnosis Date  . Diabetes mellitus   . Hyperthyroidism   . Dyslipidemia     No past surgical history on file.  No family history on file.  History   Social History  . Marital Status: Married    Spouse Name: N/A    Number of  Children: N/A  . Years of Education: N/A   Occupational History  . Not on file.   Social History Main Topics  . Smoking status: Current Every Day Smoker  . Smokeless tobacco: Not on file  . Alcohol Use: Not on file  . Drug Use: Not on file  . Sexually Active: Not on file   Other Topics Concern  . Not on file   Social History Narrative  . No narrative on file    Review of Systems:  All systems reviewed.  They are negative to the above problem except as previously stated.  Vital Signs: There were no vitals taken for this visit. BP 1488/60  P62  Wt 126 Physical Exam Patient is in NAD HEENT:  Normocephalic, atraumatic. EOMI, PERRLA.  Neck: JVP is normal.  No bruits.  Lungs: clear to auscultation. No rales no wheezes.  Heart: Regular rate and rhythm. Normal S1, S2. No S3.   No significant murmurs. PMI not displaced.  Abdomen:  Supple, nontender. Normal bowel sounds. No masses. No  hepatomegaly.  Extremities:   Good distal pulses throughout. No lower extremity edema.  Musculoskeletal :moving all extremities.  Neuro:   alert and oriented x3.  CN II-XII grossly intact.  EKG:  SR.  60  .  LVH with repol abnormality Assessment and Plan:  1.  CAD  No symptoms of angina.  Can stop Brilinta after finishes  2.  HTN  Patient reports it is usu 130 range.  Today's is high  WIll check labs.  3.  HL  WIll check lipids  4.  TOb  Counselled on quitting.   5.  DM

## 2012-09-01 ENCOUNTER — Other Ambulatory Visit: Payer: Self-pay | Admitting: *Deleted

## 2012-09-01 ENCOUNTER — Other Ambulatory Visit: Payer: Self-pay | Admitting: Cardiology

## 2012-09-01 DIAGNOSIS — I6529 Occlusion and stenosis of unspecified carotid artery: Secondary | ICD-10-CM

## 2012-09-06 ENCOUNTER — Ambulatory Visit: Payer: Self-pay | Admitting: Internal Medicine

## 2012-09-13 ENCOUNTER — Encounter (INDEPENDENT_AMBULATORY_CARE_PROVIDER_SITE_OTHER): Payer: Medicare Other

## 2012-09-13 DIAGNOSIS — I6529 Occlusion and stenosis of unspecified carotid artery: Secondary | ICD-10-CM

## 2012-09-30 ENCOUNTER — Other Ambulatory Visit: Payer: Self-pay | Admitting: Cardiology

## 2012-10-11 ENCOUNTER — Ambulatory Visit: Payer: Self-pay | Admitting: Internal Medicine

## 2012-12-07 ENCOUNTER — Ambulatory Visit: Payer: Self-pay | Admitting: Internal Medicine

## 2013-02-13 ENCOUNTER — Other Ambulatory Visit: Payer: Self-pay | Admitting: Internal Medicine

## 2013-04-12 ENCOUNTER — Ambulatory Visit: Payer: Self-pay | Admitting: Specialist

## 2013-04-17 ENCOUNTER — Encounter: Payer: Self-pay | Admitting: Internal Medicine

## 2013-04-17 ENCOUNTER — Ambulatory Visit (INDEPENDENT_AMBULATORY_CARE_PROVIDER_SITE_OTHER): Payer: Medicare Other | Admitting: Internal Medicine

## 2013-04-17 VITALS — BP 140/64 | HR 63 | Ht 62.0 in | Wt 136.8 lb

## 2013-04-17 DIAGNOSIS — R29898 Other symptoms and signs involving the musculoskeletal system: Secondary | ICD-10-CM

## 2013-04-17 DIAGNOSIS — I2510 Atherosclerotic heart disease of native coronary artery without angina pectoris: Secondary | ICD-10-CM

## 2013-04-17 NOTE — Progress Notes (Signed)
HPI HPI  Patient is a 79 year old who was admitted Aug 2012 to [facility] with inferior STEMI. She had occlusion of the RCA and underwent PTCA with DES to theprox RCA. It was a right dominant system with dual PDAs. Other vessels had irregularities. She was d/cd home on Brilinta.  LDL was 38 in hospital SInce seen the patient denies CP  Breathing is OK  Continues to smoke 1ppd Has fallen acouple times.  Has problems with stairs.   Allergies  Allergen Reactions  . Darvon   . Sulfa Antibiotics     Current Outpatient Prescriptions  Medication Sig Dispense Refill  . aspirin 81 MG tablet Take 81 mg by mouth daily.        . citalopram (CELEXA) 20 MG tablet Take 20 mg by mouth daily.        . insulin aspart (NOVOLOG) 100 UNIT/ML injection Inject into the skin 3 (three) times daily before meals. 5 to 8 units       . insulin glargine (LANTUS) 100 UNIT/ML injection Inject 14 Units into the skin at bedtime.        Marland Kitchen levothyroxine (SYNTHROID, LEVOTHROID) 75 MCG tablet Take 75 mcg by mouth daily.        Marland Kitchen lisinopril (PRINIVIL,ZESTRIL) 5 MG tablet TAKE ONE TABLET BY MOUTH EVERY DAY  30 tablet  7  . metoprolol tartrate (LOPRESSOR) 25 MG tablet Take 1 tablet (25 mg total) by mouth 2 (two) times daily.  60 tablet  11  . nitroGLYCERIN (NITROSTAT) 0.4 MG SL tablet Place 0.4 mg under the tongue every 5 (five) minutes as needed.        . pantoprazole (PROTONIX) 40 MG tablet TAKE ONE TABLET BY MOUTH EVERY DAY  30 tablet  5  . pravastatin (PRAVACHOL) 40 MG tablet Take 40 mg by mouth daily.         No current facility-administered medications for this visit.    Past Medical History  Diagnosis Date  . Diabetes mellitus   . Hyperthyroidism   . Dyslipidemia     No past surgical history on file.  No family history on file.  History   Social History  . Marital Status: Married    Spouse Name: N/A    Number of Children: N/A  . Years of Education: N/A   Occupational History  . Not on file.    Social History Main Topics  . Smoking status: Current Every Day Smoker  . Smokeless tobacco: Not on file  . Alcohol Use: Not on file  . Drug Use: Not on file  . Sexually Active: Not on file   Other Topics Concern  . Not on file   Social History Narrative  . No narrative on file    Review of Systems:  All systems reviewed.  They are negative to the above problem except as previously stated.  Vital Signs: BP 140/64  Pulse 63  Ht 5\' 2"  (1.575 m)  Wt 136 lb 12.8 oz (62.052 kg)  BMI 25.01 kg/m2  Physical Exam Patient is in NAD HEENT:  Normocephalic, atraumatic. EOMI, PERRLA.  Neck: JVP is normal.  No bruits.  Lungs: clear to auscultation. No rales no wheezes.  Heart: Regular rate and rhythm. Normal S1, S2. No S3.   No significant murmurs. PMI not displaced.  Abdomen:  Supple, nontender. Normal bowel sounds. No masses. No hepatomegaly.  Extremities:   Good distal pulses throughout. No lower extremity edema.  Musculoskeletal :moving all extremities.  Neuro:  alert and oriented x3.  CN II-XII grossly intact.   EKG  SR 63.  Anteroseptal MI  SL ST depression V4 to V6, II, III, AVF  Q wave in AVL  Assessment and Plan:  1.  CAD  No symptoms of angina.  Continue current meds  2.  HL  Keep on statin.  3.  LE weakness, falls  Will set up for PT for eval/strengthening.  I do not want to her fall and fracture hip.

## 2013-04-20 ENCOUNTER — Ambulatory Visit: Payer: Medicare Other | Attending: Internal Medicine | Admitting: Physical Therapy

## 2013-04-20 DIAGNOSIS — R269 Unspecified abnormalities of gait and mobility: Secondary | ICD-10-CM | POA: Insufficient documentation

## 2013-04-20 DIAGNOSIS — IMO0001 Reserved for inherently not codable concepts without codable children: Secondary | ICD-10-CM | POA: Insufficient documentation

## 2013-04-24 ENCOUNTER — Ambulatory Visit: Payer: Medicare Other | Admitting: Physical Therapy

## 2013-04-26 ENCOUNTER — Ambulatory Visit: Payer: Medicare Other | Admitting: Physical Therapy

## 2013-05-01 ENCOUNTER — Ambulatory Visit: Payer: Medicare Other

## 2013-05-03 ENCOUNTER — Ambulatory Visit: Payer: Medicare Other | Admitting: Physical Therapy

## 2013-05-08 ENCOUNTER — Ambulatory Visit: Payer: Medicare Other

## 2013-05-10 ENCOUNTER — Ambulatory Visit: Payer: Medicare Other | Admitting: Physical Therapy

## 2013-05-12 ENCOUNTER — Ambulatory Visit: Payer: Medicare Other

## 2013-05-15 ENCOUNTER — Ambulatory Visit: Payer: Medicare Other | Admitting: Physical Therapy

## 2013-05-17 ENCOUNTER — Ambulatory Visit: Payer: Medicare Other | Attending: Internal Medicine | Admitting: Physical Therapy

## 2013-05-17 DIAGNOSIS — R269 Unspecified abnormalities of gait and mobility: Secondary | ICD-10-CM | POA: Insufficient documentation

## 2013-05-17 DIAGNOSIS — IMO0001 Reserved for inherently not codable concepts without codable children: Secondary | ICD-10-CM | POA: Insufficient documentation

## 2013-05-25 ENCOUNTER — Ambulatory Visit: Payer: Self-pay | Admitting: Unknown Physician Specialty

## 2013-05-29 LAB — PATHOLOGY REPORT

## 2013-09-04 ENCOUNTER — Other Ambulatory Visit: Payer: Self-pay | Admitting: Internal Medicine

## 2013-10-09 ENCOUNTER — Other Ambulatory Visit: Payer: Self-pay | Admitting: Internal Medicine

## 2013-10-23 ENCOUNTER — Ambulatory Visit: Payer: Self-pay | Admitting: Specialist

## 2013-11-20 ENCOUNTER — Ambulatory Visit: Payer: Self-pay | Admitting: Internal Medicine

## 2014-01-11 ENCOUNTER — Ambulatory Visit: Payer: Medicare Other | Admitting: Internal Medicine

## 2014-01-18 ENCOUNTER — Encounter (INDEPENDENT_AMBULATORY_CARE_PROVIDER_SITE_OTHER): Payer: Self-pay

## 2014-01-18 ENCOUNTER — Encounter: Payer: Self-pay | Admitting: Internal Medicine

## 2014-01-18 ENCOUNTER — Ambulatory Visit (INDEPENDENT_AMBULATORY_CARE_PROVIDER_SITE_OTHER): Payer: Medicare Other | Admitting: Internal Medicine

## 2014-01-18 VITALS — BP 146/60 | HR 60 | Ht 60.0 in | Wt 134.0 lb

## 2014-01-18 DIAGNOSIS — E785 Hyperlipidemia, unspecified: Secondary | ICD-10-CM

## 2014-01-18 DIAGNOSIS — I10 Essential (primary) hypertension: Secondary | ICD-10-CM

## 2014-01-18 DIAGNOSIS — I2510 Atherosclerotic heart disease of native coronary artery without angina pectoris: Secondary | ICD-10-CM

## 2014-01-18 MED ORDER — LISINOPRIL 5 MG PO TABS: 5.0000 mg | ORAL_TABLET | Freq: Every day | ORAL | Status: DC

## 2014-01-18 MED ORDER — METOPROLOL TARTRATE 25 MG PO TABS: 25.0000 mg | ORAL_TABLET | Freq: Two times a day (BID) | ORAL | Status: DC

## 2014-01-18 NOTE — Patient Instructions (Signed)
Your physician wants you to follow-up in: 9 MONTHS WITH DR ROSS You will receive a reminder letter in the mail two months in advance. If you don't receive a letter, please call our office to schedule the follow-up appointment.  

## 2014-01-18 NOTE — Progress Notes (Signed)
HPI HPI  Patient is a 80 year old who was admitted Aug 2012 to [facility] with inferior STEMI. She had occlusion of the RCA and underwent PTCA with DES to theprox RCA. It was a right dominant system with dual PDAs. Other vessels had irregularities. She was d/cd home on Brilinta.   I saw her in clinic last summer.  SInce seen she denies CP  Breathing is stable  No dizziness  Allergies  Allergen Reactions  . Darvon   . Sulfa Antibiotics     Current Outpatient Prescriptions  Medication Sig Dispense Refill  . aspirin 81 MG tablet Take 81 mg by mouth daily.        . citalopram (CELEXA) 20 MG tablet Take 20 mg by mouth daily.        . insulin glargine (LANTUS) 100 UNIT/ML injection Inject 15 Units into the skin at bedtime.       . insulin lispro (HUMALOG) 100 UNIT/ML injection Inject into the skin 3 (three) times daily before meals. 5 TO 8 UNITS      . levothyroxine (SYNTHROID, LEVOTHROID) 75 MCG tablet Take 75 mcg by mouth daily.        Marland Kitchen. lisinopril (PRINIVIL,ZESTRIL) 5 MG tablet TAKE ONE TABLET BY MOUTH ONCE DAILY  30 tablet  3  . metoprolol tartrate (LOPRESSOR) 25 MG tablet TAKE ONE TABLET BY MOUTH TWICE DAILY  60 tablet  3  . nitroGLYCERIN (NITROSTAT) 0.4 MG SL tablet Place 0.4 mg under the tongue every 5 (five) minutes as needed.        . pantoprazole (PROTONIX) 40 MG tablet TAKE ONE TABLET BY MOUTH EVERY DAY  30 tablet  5  . pravastatin (PRAVACHOL) 40 MG tablet Take 40 mg by mouth daily.         No current facility-administered medications for this visit.    Past Medical History  Diagnosis Date  . Diabetes mellitus   . Hyperthyroidism   . Dyslipidemia     No past surgical history on file.  No family history on file.  History   Social History  . Marital Status: Married    Spouse Name: N/A    Number of Children: N/A  . Years of Education: N/A   Occupational History  . Not on file.   Social History Main Topics  . Smoking status: Current Every Day Smoker -- 1.00  packs/day    Types: Cigarettes  . Smokeless tobacco: Not on file  . Alcohol Use: Not on file  . Drug Use: Not on file  . Sexual Activity: Not on file   Other Topics Concern  . Not on file   Social History Narrative  . No narrative on file    Review of Systems:  All systems reviewed.  They are negative to the above problem except as previously stated.  Vital Signs: BP 146/60  Pulse 60  Ht 5' (1.524 m)  Wt 134 lb (60.782 kg)  BMI 26.17 kg/m2  Physical Exam Patient is in NAD HEENT:  Normocephalic, atraumatic. EOMI, PERRLA.  Neck: JVP is normal.  No bruits.  Lungs: clear to auscultation. No rales no wheezes. Decreased airflow Heart: Regular rate and rhythm. Normal S1, S2. No S3.   No significant murmurs. PMI not displaced.  Abdomen:  Supple, nontender. Normal bowel sounds. No masses. No hepatomegaly.  Extremities:   Good distal pulses throughout. No lower extremity edema.  Musculoskeletal :moving all extremities.  Neuro:   alert and oriented x3.  CN II-XII grossly  intact.    Assessment and Plan:  1.  CAD  No symptoms of angina.  Continue current regimen  2.  HL  Get labs from [facility].  Keep on meds  3.  Tob  Counselled on cessation.    F/U in November

## 2014-04-26 DIAGNOSIS — J449 Chronic obstructive pulmonary disease, unspecified: Secondary | ICD-10-CM | POA: Insufficient documentation

## 2014-09-21 ENCOUNTER — Other Ambulatory Visit (HOSPITAL_COMMUNITY): Payer: Self-pay | Admitting: *Deleted

## 2014-09-21 DIAGNOSIS — I6523 Occlusion and stenosis of bilateral carotid arteries: Secondary | ICD-10-CM

## 2014-10-08 ENCOUNTER — Ambulatory Visit (HOSPITAL_COMMUNITY): Payer: Medicare Other | Attending: Internal Medicine | Admitting: Cardiology

## 2014-10-08 DIAGNOSIS — Z720 Tobacco use: Secondary | ICD-10-CM | POA: Insufficient documentation

## 2014-10-08 DIAGNOSIS — E119 Type 2 diabetes mellitus without complications: Secondary | ICD-10-CM | POA: Diagnosis not present

## 2014-10-08 DIAGNOSIS — I2510 Atherosclerotic heart disease of native coronary artery without angina pectoris: Secondary | ICD-10-CM | POA: Diagnosis not present

## 2014-10-08 DIAGNOSIS — E785 Hyperlipidemia, unspecified: Secondary | ICD-10-CM | POA: Diagnosis not present

## 2014-10-08 DIAGNOSIS — I6523 Occlusion and stenosis of bilateral carotid arteries: Secondary | ICD-10-CM

## 2014-10-08 NOTE — Progress Notes (Signed)
Carotid duplex performed 

## 2014-10-18 ENCOUNTER — Encounter: Payer: Self-pay | Admitting: *Deleted

## 2014-10-21 NOTE — Progress Notes (Signed)
HPI HPI  Patient is a 80 year old who was admitted Aug 2012 to [facility] with inferior STEMI. She had occlusion of the RCA and underwent PTCA with DES to theprox RCA. It was a right dominant system with dual PDAs. Other vessels had irregularities. She was d/cd home on Brilinta.   I saw her in clinic last March Since seen doing well  Breathing OK  No CP  No dizziness Not walking  Gets wood  Doing well  Still smoking    Allergies  Allergen Reactions  . Darvon   . Sulfa Antibiotics     Current Outpatient Prescriptions  Medication Sig Dispense Refill  . aspirin 81 MG tablet Take 81 mg by mouth daily.      . citalopram (CELEXA) 20 MG tablet Take 20 mg by mouth daily.      . insulin glargine (LANTUS) 100 UNIT/ML injection Inject 15 Units into the skin at bedtime.     . insulin lispro (HUMALOG) 100 UNIT/ML injection Inject into the skin 3 (three) times daily before meals. 5 TO 8 UNITS    . levothyroxine (SYNTHROID, LEVOTHROID) 75 MCG tablet Take 75 mcg by mouth daily.      Marland Kitchen. lisinopril (PRINIVIL,ZESTRIL) 10 MG tablet Take 10 mg by mouth daily. TAKE ONE TABLET BY MOUTH ONCE DAILY    . metoprolol tartrate (LOPRESSOR) 25 MG tablet Take 1 tablet (25 mg total) by mouth 2 (two) times daily. 60 tablet 12  . nitroGLYCERIN (NITROSTAT) 0.4 MG SL tablet Place 0.4 mg under the tongue every 5 (five) minutes as needed.      . pantoprazole (PROTONIX) 40 MG tablet TAKE ONE TABLET BY MOUTH EVERY DAY 30 tablet 5  . pravastatin (PRAVACHOL) 40 MG tablet Take 40 mg by mouth daily.       No current facility-administered medications for this visit.    Past Medical History  Diagnosis Date  . Diabetes mellitus   . Hyperthyroidism   . Dyslipidemia     Past Surgical History  Procedure Laterality Date  . No surgical hx      No family history on file.  History   Social History  . Marital Status: Married    Spouse Name: N/A    Number of Children: N/A  . Years of Education: N/A   Occupational  History  . Not on file.   Social History Main Topics  . Smoking status: Current Every Day Smoker -- 1.00 packs/day    Types: Cigarettes  . Smokeless tobacco: Not on file  . Alcohol Use: Not on file  . Drug Use: Not on file  . Sexual Activity: Not on file   Other Topics Concern  . Not on file   Social History Narrative    Review of Systems:  All systems reviewed.  They are negative to the above problem except as previously stated.  Vital Signs: BP 124/68 mmHg  Pulse 60  Ht 5\' 1"  (1.549 m)  Wt 128 lb (58.06 kg)  BMI 24.20 kg/m2  Physical Exam Patient is in NAD HEENT:  Normocephalic, atraumatic. EOMI, PERRLA.  Neck: JVP is normal.  No bruits.  Lungs: No rales no wheezes. Decreased airflow Heart: Regular rate and rhythm. Normal S1, S2. No S3.   No significant murmurs. PMI not displaced.  Abdomen:  Supple, nontender. Normal bowel sounds. No masses. No hepatomegaly.  Extremities:   Good distal pulses throughout. No lower extremity edema.  Musculoskeletal :moving all extremities.  Neuro:   alert and  oriented x3.  CN II-XII grossly intact  Assessment and Plan:  1.  CAD  No symptoms of angina.  Continue current regimen Get las ss  2.  HL  Labs today    Keep on meds  3.  Tob  Counselled on cessation.  Still smoking 1 ppd    F/U in July /Aug

## 2014-10-22 ENCOUNTER — Encounter: Payer: Self-pay | Admitting: Internal Medicine

## 2014-10-22 ENCOUNTER — Ambulatory Visit (INDEPENDENT_AMBULATORY_CARE_PROVIDER_SITE_OTHER): Payer: Medicare Other | Admitting: Internal Medicine

## 2014-10-22 VITALS — BP 124/68 | HR 60 | Ht 61.0 in | Wt 128.0 lb

## 2014-10-22 DIAGNOSIS — R0602 Shortness of breath: Secondary | ICD-10-CM

## 2014-10-22 DIAGNOSIS — E785 Hyperlipidemia, unspecified: Secondary | ICD-10-CM

## 2014-10-22 DIAGNOSIS — I2510 Atherosclerotic heart disease of native coronary artery without angina pectoris: Secondary | ICD-10-CM

## 2014-10-22 LAB — CBC
HCT: 35.8 % — ABNORMAL LOW (ref 36.0–46.0)
Hemoglobin: 11.7 g/dL — ABNORMAL LOW (ref 12.0–15.0)
MCHC: 32.7 g/dL (ref 30.0–36.0)
MCV: 92.7 fl (ref 78.0–100.0)
Platelets: 185 10*3/uL (ref 150.0–400.0)
RBC: 3.87 Mil/uL (ref 3.87–5.11)
RDW: 13.1 % (ref 11.5–15.5)
WBC: 6 10*3/uL (ref 4.0–10.5)

## 2014-10-22 LAB — BASIC METABOLIC PANEL
BUN: 9 mg/dL (ref 6–23)
CO2: 28 mEq/L (ref 19–32)
Calcium: 8.7 mg/dL (ref 8.4–10.5)
Chloride: 106 mEq/L (ref 96–112)
Creatinine, Ser: 0.7 mg/dL (ref 0.4–1.2)
GFR: 80.62 mL/min (ref >60.00)
Glucose, Bld: 149 mg/dL — ABNORMAL HIGH (ref 70–99)
Potassium: 3.8 mEq/L (ref 3.5–5.1)
Sodium: 140 mEq/L (ref 135–145)

## 2014-10-22 LAB — LIPID PANEL
Cholesterol: 160 mg/dL (ref 0–200)
HDL: 55.6 mg/dL (ref >39.00)
LDL Cholesterol: 92 mg/dL (ref 0–99)
NonHDL: 104.4
Total CHOL/HDL Ratio: 3
Triglycerides: 61 mg/dL (ref 0.0–149.0)
VLDL: 12.2 mg/dL (ref 0.0–40.0)

## 2014-10-22 LAB — TSH: TSH: 1.36 u[IU]/mL (ref 0.35–4.50)

## 2014-10-22 NOTE — Patient Instructions (Signed)
Your physician recommends that you continue on your current medications as directed. Please refer to the Current Medication list given to you today. Your physician recommends that you return for lab work today (cbc, bmet, lipid, tsh)

## 2014-10-29 ENCOUNTER — Other Ambulatory Visit: Payer: Self-pay

## 2014-10-29 ENCOUNTER — Telehealth: Payer: Self-pay | Admitting: Internal Medicine

## 2014-10-29 DIAGNOSIS — Z79899 Other long term (current) drug therapy: Secondary | ICD-10-CM

## 2014-10-29 DIAGNOSIS — E785 Hyperlipidemia, unspecified: Secondary | ICD-10-CM

## 2014-10-29 MED ORDER — ATORVASTATIN CALCIUM 20 MG PO TABS: 20.0000 mg | ORAL_TABLET | Freq: Every day | ORAL | Status: DC

## 2014-10-29 NOTE — Telephone Encounter (Signed)
New message ° ° ° ° °Want lab results °

## 2014-10-29 NOTE — Telephone Encounter (Signed)
Patient informed. Documented in results note.

## 2014-12-26 ENCOUNTER — Other Ambulatory Visit: Payer: Self-pay

## 2014-12-27 ENCOUNTER — Ambulatory Visit: Payer: Self-pay | Admitting: Internal Medicine

## 2015-02-04 ENCOUNTER — Other Ambulatory Visit: Payer: Self-pay | Admitting: Internal Medicine

## 2015-06-04 ENCOUNTER — Other Ambulatory Visit: Payer: Self-pay | Admitting: Internal Medicine

## 2015-06-10 ENCOUNTER — Ambulatory Visit (INDEPENDENT_AMBULATORY_CARE_PROVIDER_SITE_OTHER): Payer: Medicare Other | Admitting: Internal Medicine

## 2015-06-10 ENCOUNTER — Encounter: Payer: Self-pay | Admitting: Internal Medicine

## 2015-06-10 VITALS — BP 124/60 | HR 63 | Ht 61.0 in | Wt 127.0 lb

## 2015-06-10 DIAGNOSIS — I2510 Atherosclerotic heart disease of native coronary artery without angina pectoris: Secondary | ICD-10-CM

## 2015-06-10 LAB — CBC
HCT: 34.6 % — ABNORMAL LOW (ref 36.0–46.0)
Hemoglobin: 11.5 g/dL — ABNORMAL LOW (ref 12.0–15.0)
MCHC: 33.3 g/dL (ref 30.0–36.0)
MCV: 93.2 fl (ref 78.0–100.0)
Platelets: 97 10*3/uL — ABNORMAL LOW (ref 150.0–400.0)
RBC: 3.71 Mil/uL — ABNORMAL LOW (ref 3.87–5.11)
RDW: 13.1 % (ref 11.5–15.5)
WBC: 6 10*3/uL (ref 4.0–10.5)

## 2015-06-10 LAB — LIPID PANEL
Cholesterol: 109 mg/dL (ref 0–200)
HDL: 46.2 mg/dL (ref >39.00)
LDL Cholesterol: 50 mg/dL (ref 0–99)
NonHDL: 62.8
Total CHOL/HDL Ratio: 2
Triglycerides: 66 mg/dL (ref 0.0–149.0)
VLDL: 13.2 mg/dL (ref 0.0–40.0)

## 2015-06-10 LAB — BASIC METABOLIC PANEL
BUN: 8 mg/dL (ref 6–23)
CO2: 29 mEq/L (ref 19–32)
Calcium: 8.7 mg/dL (ref 8.4–10.5)
Chloride: 104 mEq/L (ref 96–112)
Creatinine, Ser: 0.73 mg/dL (ref 0.40–1.20)
GFR: 81.76 mL/min (ref >60.00)
Glucose, Bld: 237 mg/dL — ABNORMAL HIGH (ref 70–99)
Potassium: 3.8 mEq/L (ref 3.5–5.1)
Sodium: 139 mEq/L (ref 135–145)

## 2015-06-10 MED ORDER — METOPROLOL TARTRATE 25 MG PO TABS
25.0000 mg | ORAL_TABLET | Freq: Two times a day (BID) | ORAL | Status: DC
Start: 2015-06-10 — End: 2016-07-08

## 2015-06-10 NOTE — Progress Notes (Signed)
Cardiology Office Note   Date:  06/10/2015   ID:  Madeline [name], DOB Mar 23, 1934, MRN [id number]  PCP:  Marguarite Arbour, MD  Cardiologist:   Dietrich Pates, MD   No chief complaint on file.  F/U of CAD     History of Present Illness: Madeline [name] is a 81 y.o. female with a history of CAD, s/p inferior STEMI. She had occlusion of the RCA and underwent PTCA with DES to theprox RCA. It was a right dominant system with dual PDAs. Other vessels had irregularities. She was d/cd home on Brilinta.   I saw her in December 2015 Did  hurt 1 time in [facility]   3 wks ago  In bed  Lasted about 15 min  NO other episodes   Breathing is OK      Current Outpatient Prescriptions  Medication Sig Dispense Refill  . albuterol (PROAIR HFA) 108 (90 BASE) MCG/ACT inhaler Inhale into the lungs.    . ALBUTEROL IN Inhale 2 puffs into the lungs as needed (SOB).    Marland Kitchen aspirin 81 MG tablet Take 81 mg by mouth daily.      Marland Kitchen atorvastatin (LIPITOR) 20 MG tablet Take 1 tablet (20 mg total) by mouth daily. 90 tablet 3  . citalopram (CELEXA) 20 MG tablet Take 20 mg by mouth daily.      . insulin glargine (LANTUS) 100 UNIT/ML injection Inject 15 Units into the skin at bedtime.     . insulin lispro (HUMALOG) 100 UNIT/ML injection Inject into the skin 3 (three) times daily before meals. 5 TO 8 UNITS    . levothyroxine (SYNTHROID, LEVOTHROID) 75 MCG tablet Take 75 mcg by mouth daily.      Marland Kitchen lisinopril (PRINIVIL,ZESTRIL) 10 MG tablet Take 10 mg by mouth daily. TAKE ONE TABLET BY MOUTH ONCE DAILY    . metoprolol tartrate (LOPRESSOR) 25 MG tablet TAKE ONE TABLET BY MOUTH TWICE DAILY 60 tablet 0  . nitroGLYCERIN (NITROSTAT) 0.4 MG SL tablet Place 0.4 mg under the tongue every 5 (five) minutes as needed.      . pantoprazole (PROTONIX) 40 MG tablet TAKE ONE TABLET BY MOUTH EVERY DAY 30 tablet 5  . pravastatin (PRAVACHOL) 40 MG tablet Take 40 mg by mouth daily.       No current facility-administered medications for this  visit.    Allergies:   Darvon and Sulfa antibiotics   Past Medical History  Diagnosis Date  . Diabetes mellitus   . Hyperthyroidism   . Dyslipidemia     Past Surgical History  Procedure Laterality Date  . No surgical hx       Social History:  The patient  reports that she has been smoking Cigarettes.  She has been smoking about 1.00 pack per day. She does not have any smokeless tobacco history on file.   Family History:  The patient's family history includes Cancer in her sister, sister, and sister; Diabetes in her son; Heart Problems in her mother; Other in her father.    ROS:  Please see the history of present illness. All other systems are reviewed and  Negative to the above problem except as noted.    PHYSICAL EXAM: VS:  BP 124/60 mmHg  Pulse 63  Ht 5\' 1"  (1.549 m)  Wt 127 lb (57.607 kg)  BMI 24.01 kg/m2  GEN: Well nourished, well developed, in no acute distress HEENT: normal Neck: no JVD, carotid bruits, or masses Cardiac: RRR; no murmurs, rubs, or gallops,no  edema  Respiratory:  clear to auscultation bilaterally, normal work of breathing GI: soft, nontender, nondistended, + BS  No hepatomegaly  MS: no deformity Moving all extremities   Skin: warm and dry, no rash Neuro:  Strength and sensation are intact Psych: euthymic mood, full affect   EKG:  EKG is ordered today.  SR  63  LVH with strain pattern   Lipid Panel    Component Value Date/Time   CHOL 160 10/22/2014 1326   TRIG 61.0 10/22/2014 1326   HDL 55.60 10/22/2014 1326   CHOLHDL 3 10/22/2014 1326   VLDL 12.2 10/22/2014 1326   LDLCALC 92 10/22/2014 1326      Wt Readings from Last 3 Encounters:  06/10/15 127 lb (57.607 kg)  10/22/14 128 lb (58.06 kg)  01/18/14 134 lb (60.782 kg)      ASSESSMENT AND PLAN:  1.  CAD  Appears to be doing good  No CP  I would keep on same regimen   2.  HL  Need to confirm meds  Lipitor and pravatstin noted  Would prefer lipitor      Disposition:   FU with  me in 6 months   Signed, Dietrich Pates, MD  06/10/2015 9:30 AM    [facility] [address] [place], [place], [place]  [address] Phone: [phone]; Fax: [phone]

## 2015-06-10 NOTE — Patient Instructions (Signed)
Medication Instructions:   Your physician recommends that you continue on your current medications as directed. Please refer to the Current Medication list given to you today.    Labwork:  CBC BMET LIPID ALT    Testing/Procedures:   Follow-Up:  Your physician wants you to follow-up in:  IN 6 MONTHS WITH DR Tenny Craw  You will receive a reminder letter in the mail two months in advance. If you don't receive a letter, please call our office to schedule the follow-up appointment.    Any Other Special Instructions Will Be Listed Below (If Applicable).

## 2015-08-29 ENCOUNTER — Telehealth: Payer: Self-pay | Admitting: Internal Medicine

## 2015-08-29 NOTE — Telephone Encounter (Signed)
Walk in pt form-[facility]-clearance dropped off[facility]

## 2015-09-03 ENCOUNTER — Telehealth: Payer: Self-pay | Admitting: *Deleted

## 2015-09-03 NOTE — Telephone Encounter (Signed)
Form placed in nurse fax bin to [facility] --request of release for treatment.

## 2015-09-04 ENCOUNTER — Telehealth: Payer: Self-pay | Admitting: Internal Medicine

## 2015-09-04 NOTE — Telephone Encounter (Signed)
Pts dentist office calling to inform Dr Tenny Crawoss and nurse that they received the pts clearance papers today for her to receive 21 extractions.  Per Sheree at the Dentist office, they need Dr Tenny Crawoss to clarify when the pt can come off of her Aspirin prior to her extraction, and how long she should remain off her Aspirin, post extraction.  Per Justin MendSheree, she states that a clearance note can be re-faxed to their office, or Dr Tenny Crawoss and nurse can call their office at [phone] at [facility], to give [facility] verbal [facility] for this.  Informed Sheree that Dr Tenny Crawoss and her nurse are both out of the office today, but I will route this message to the both of them for further review and recommendation and someone will follow-up with her thereafter.  Sheree at [facility] verbalized understanding and agrees with this plan.

## 2015-09-04 NOTE — Telephone Encounter (Signed)
Per Lendon KaMichalene Wilson RN Dr. Tenny Crawoss' nurse: form was placed in nurse fax bin [facility]. Pt is aware.

## 2015-09-04 NOTE — Telephone Encounter (Signed)
New problem ° ° °Pt returning call from nurse. Please call pt. °

## 2015-09-05 NOTE — Telephone Encounter (Signed)
Remote intervention OK to come off aspirin 5 days prior.  Resume after surgery/extractions

## 2015-09-06 NOTE — Telephone Encounter (Signed)
Attempted to call Sheree at [facility], no answer. Will fax documention below to them.

## 2015-10-28 ENCOUNTER — Other Ambulatory Visit: Payer: Self-pay | Admitting: Internal Medicine

## 2015-12-13 DIAGNOSIS — E1065 Type 1 diabetes mellitus with hyperglycemia: Secondary | ICD-10-CM | POA: Diagnosis not present

## 2015-12-13 DIAGNOSIS — Z794 Long term (current) use of insulin: Secondary | ICD-10-CM | POA: Diagnosis not present

## 2015-12-13 DIAGNOSIS — E108 Type 1 diabetes mellitus with unspecified complications: Secondary | ICD-10-CM | POA: Diagnosis not present

## 2015-12-13 DIAGNOSIS — E1043 Type 1 diabetes mellitus with diabetic autonomic (poly)neuropathy: Secondary | ICD-10-CM | POA: Diagnosis not present

## 2015-12-13 DIAGNOSIS — I10 Essential (primary) hypertension: Secondary | ICD-10-CM | POA: Diagnosis not present

## 2015-12-13 DIAGNOSIS — Z9641 Presence of insulin pump (external) (internal): Secondary | ICD-10-CM | POA: Diagnosis not present

## 2015-12-31 DIAGNOSIS — Z794 Long term (current) use of insulin: Secondary | ICD-10-CM | POA: Diagnosis not present

## 2015-12-31 DIAGNOSIS — B351 Tinea unguium: Secondary | ICD-10-CM | POA: Diagnosis not present

## 2015-12-31 DIAGNOSIS — E1142 Type 2 diabetes mellitus with diabetic polyneuropathy: Secondary | ICD-10-CM | POA: Diagnosis not present

## 2016-01-10 ENCOUNTER — Ambulatory Visit (INDEPENDENT_AMBULATORY_CARE_PROVIDER_SITE_OTHER): Payer: PPO | Admitting: Internal Medicine

## 2016-01-10 ENCOUNTER — Encounter: Payer: Self-pay | Admitting: Internal Medicine

## 2016-01-10 VITALS — BP 124/62 | HR 64 | Ht 61.0 in | Wt 110.4 lb

## 2016-01-10 DIAGNOSIS — I10 Essential (primary) hypertension: Secondary | ICD-10-CM

## 2016-01-10 DIAGNOSIS — E785 Hyperlipidemia, unspecified: Secondary | ICD-10-CM

## 2016-01-10 DIAGNOSIS — I2510 Atherosclerotic heart disease of native coronary artery without angina pectoris: Secondary | ICD-10-CM

## 2016-01-10 NOTE — Patient Instructions (Signed)
Your physician recommends that you continue on your current medications as directed. Please refer to the Current Medication list given to you today.  Your physician wants you to follow-up in: September, 2017 with Dr. Tenny Craw.  You will receive a reminder letter in the mail two months in advance. If you don't receive a letter, please call our office to schedule the follow-up appointment.

## 2016-01-10 NOTE — Progress Notes (Signed)
Cardiology Office Note   Date:  01/10/2016   ID:  KADIJAH SHAMOON, DOB December 15, 1933, MRN [id number]  PCP:  Marguarite Arbour, MD  Cardiologist:   Dietrich Pates, MD   FI pf CAD     History of Present Illness: Madeline [name] is a 82 y.o. female with a history of CAD, s/p inferior STEMI om 2012  She had occlusion of the RCA and underwent PTCA with DES to theprox RCA. It was a right dominant system with dual PDAs. Other vessels had irregularities. She was d/cd home on Brilinta.   I saw her in July 2016 Pt denies CP  Breathing OK  No dizzines Active   Takes pravastatin   Still smoking  1ppd       Outpatient Prescriptions Prior to Visit  Medication Sig Dispense Refill  . aspirin 81 MG tablet Take 81 mg by mouth daily.      . citalopram (CELEXA) 20 MG tablet Take 20 mg by mouth daily.      . insulin glargine (LANTUS) 100 UNIT/ML injection Inject 15 Units into the skin at bedtime.     . insulin lispro (HUMALOG) 100 UNIT/ML injection Inject into the skin 3 (three) times daily before meals. 5 TO 8 UNITS    . levothyroxine (SYNTHROID, LEVOTHROID) 75 MCG tablet Take 75 mcg by mouth daily.      Marland Kitchen lisinopril (PRINIVIL,ZESTRIL) 10 MG tablet Take 10 mg by mouth daily. TAKE ONE TABLET BY MOUTH ONCE DAILY    . metoprolol tartrate (LOPRESSOR) 25 MG tablet Take 1 tablet (25 mg total) by mouth 2 (two) times daily. 180 tablet 3  . nitroGLYCERIN (NITROSTAT) 0.4 MG SL tablet Place 0.4 mg under the tongue every 5 (five) minutes as needed.      . pantoprazole (PROTONIX) 40 MG tablet TAKE ONE TABLET BY MOUTH EVERY DAY 30 tablet 5  . ALBUTEROL IN Inhale 2 puffs into the lungs as needed (SOB).    Marland Kitchen atorvastatin (LIPITOR) 20 MG tablet Take 1 tablet (20 mg total) by mouth daily. 90 tablet 1  . pravastatin (PRAVACHOL) 40 MG tablet Take 40 mg by mouth daily.      Marland Kitchen albuterol (PROAIR HFA) 108 (90 BASE) MCG/ACT inhaler Inhale into the lungs.     No facility-administered medications prior to visit.      Allergies:   Darvon; Propoxyphene; and Sulfa antibiotics   Past Medical History  Diagnosis Date  . Diabetes mellitus   . Hyperthyroidism   . Dyslipidemia     Past Surgical History  Procedure Laterality Date  . No surgical hx       Social History:  The patient  reports that she has been smoking Cigarettes.  She has been smoking about 1.00 pack per day. She does not have any smokeless tobacco history on file.   Family History:  The patient's family history includes Cancer in her sister, sister, and sister; Diabetes in her son; Heart Problems in her mother; Other in her father.    ROS:  Please see the history of present illness. All other systems are reviewed and  Negative to the above problem except as noted.    PHYSICAL EXAM: VS:  BP 124/62 mmHg  Pulse 64  Ht  (1.549 m)  Wt 110 lb 6.4 oz (50.077 kg)  BMI 20.87 kg/m2  SpO2 98%  GEN: Thin 82 yo , in no acute distress HEENT: normal Neck: no JVD, carotid bruits, or masses Cardiac: RRR; no murmurs, rubs,  or gallops,no edema  Respiratory: Decreased airflow   GI: soft, nontender, nondistended, + BS  No hepatomegaly  MS: no deformity Moving all extremities   Skin: warm and dry, no rash Neuro:  Strength and sensation are intact Psych: euthymic mood, full affect   EKG:  EKG is not  ordered today.   Lipid Panel    Component Value Date/Time   CHOL 109 06/10/2015 0954   TRIG 66.0 06/10/2015 0954   HDL 46.20 06/10/2015 0954   CHOLHDL 2 06/10/2015 0954   VLDL 13.2 06/10/2015 0954   LDLCALC 50 06/10/2015 0954      Wt Readings from Last 3 Encounters:  01/10/16 110 lb 6.4 oz (50.077 kg)  06/10/15 127 lb (57.607 kg)  10/22/14 128 lb (58.06 kg)      ASSESSMENT AND PLAN: 1.  CAD  No symptoms of angina  Keep on same meds    2.  HL  LDL in July ws 50  HDL 46  Keep on pravastatij  3  Tob  Still smoking  Has tried quitting  Counselled on cutting back  F/u in December 2017    Signed, Dietrich Pates, MD   01/10/2016 2:40 PM    [facility] [address] [place], [place], [place]  [address] Phone: [phone]; Fax: [phone]

## 2016-01-13 DIAGNOSIS — E1065 Type 1 diabetes mellitus with hyperglycemia: Secondary | ICD-10-CM | POA: Diagnosis not present

## 2016-01-21 DIAGNOSIS — I10 Essential (primary) hypertension: Secondary | ICD-10-CM | POA: Diagnosis not present

## 2016-01-21 DIAGNOSIS — E7800 Pure hypercholesterolemia, unspecified: Secondary | ICD-10-CM | POA: Diagnosis not present

## 2016-01-21 DIAGNOSIS — Z1239 Encounter for other screening for malignant neoplasm of breast: Secondary | ICD-10-CM | POA: Diagnosis not present

## 2016-01-21 DIAGNOSIS — Z79899 Other long term (current) drug therapy: Secondary | ICD-10-CM | POA: Diagnosis not present

## 2016-01-21 DIAGNOSIS — J431 Panlobular emphysema: Secondary | ICD-10-CM | POA: Diagnosis not present

## 2016-01-21 DIAGNOSIS — E1042 Type 1 diabetes mellitus with diabetic polyneuropathy: Secondary | ICD-10-CM | POA: Diagnosis not present

## 2016-01-21 DIAGNOSIS — E039 Hypothyroidism, unspecified: Secondary | ICD-10-CM | POA: Diagnosis not present

## 2016-01-29 ENCOUNTER — Other Ambulatory Visit: Payer: Self-pay | Admitting: Internal Medicine

## 2016-01-29 DIAGNOSIS — Z1231 Encounter for screening mammogram for malignant neoplasm of breast: Secondary | ICD-10-CM

## 2016-01-30 DIAGNOSIS — E1065 Type 1 diabetes mellitus with hyperglycemia: Secondary | ICD-10-CM | POA: Diagnosis not present

## 2016-02-03 ENCOUNTER — Ambulatory Visit
Admission: RE | Admit: 2016-02-03 | Discharge: 2016-02-03 | Disposition: A | Discharge status: Home / Self Care | Payer: PPO | Source: Ambulatory Visit | Attending: Internal Medicine | Admitting: Internal Medicine

## 2016-02-03 ENCOUNTER — Other Ambulatory Visit: Payer: Self-pay | Admitting: Internal Medicine

## 2016-02-03 DIAGNOSIS — Z79899 Other long term (current) drug therapy: Secondary | ICD-10-CM | POA: Diagnosis not present

## 2016-02-03 DIAGNOSIS — E7800 Pure hypercholesterolemia, unspecified: Secondary | ICD-10-CM | POA: Diagnosis not present

## 2016-02-03 DIAGNOSIS — Z1231 Encounter for screening mammogram for malignant neoplasm of breast: Secondary | ICD-10-CM

## 2016-02-03 DIAGNOSIS — I10 Essential (primary) hypertension: Secondary | ICD-10-CM | POA: Diagnosis not present

## 2016-02-03 DIAGNOSIS — E1065 Type 1 diabetes mellitus with hyperglycemia: Secondary | ICD-10-CM | POA: Diagnosis not present

## 2016-02-03 DIAGNOSIS — E1043 Type 1 diabetes mellitus with diabetic autonomic (poly)neuropathy: Secondary | ICD-10-CM | POA: Diagnosis not present

## 2016-02-03 DIAGNOSIS — E108 Type 1 diabetes mellitus with unspecified complications: Secondary | ICD-10-CM | POA: Diagnosis not present

## 2016-02-03 DIAGNOSIS — E039 Hypothyroidism, unspecified: Secondary | ICD-10-CM | POA: Diagnosis not present

## 2016-02-10 DIAGNOSIS — I10 Essential (primary) hypertension: Secondary | ICD-10-CM | POA: Diagnosis not present

## 2016-02-10 DIAGNOSIS — Z9641 Presence of insulin pump (external) (internal): Secondary | ICD-10-CM | POA: Diagnosis not present

## 2016-02-10 DIAGNOSIS — E1065 Type 1 diabetes mellitus with hyperglycemia: Secondary | ICD-10-CM | POA: Diagnosis not present

## 2016-02-10 DIAGNOSIS — E1043 Type 1 diabetes mellitus with diabetic autonomic (poly)neuropathy: Secondary | ICD-10-CM | POA: Diagnosis not present

## 2016-02-10 DIAGNOSIS — F17200 Nicotine dependence, unspecified, uncomplicated: Secondary | ICD-10-CM | POA: Diagnosis not present

## 2016-03-12 DIAGNOSIS — E1065 Type 1 diabetes mellitus with hyperglycemia: Secondary | ICD-10-CM | POA: Diagnosis not present

## 2016-04-11 DIAGNOSIS — E1065 Type 1 diabetes mellitus with hyperglycemia: Secondary | ICD-10-CM | POA: Diagnosis not present

## 2016-04-21 DIAGNOSIS — Z79899 Other long term (current) drug therapy: Secondary | ICD-10-CM | POA: Diagnosis not present

## 2016-04-21 DIAGNOSIS — E7800 Pure hypercholesterolemia, unspecified: Secondary | ICD-10-CM | POA: Diagnosis not present

## 2016-04-21 DIAGNOSIS — F3341 Major depressive disorder, recurrent, in partial remission: Secondary | ICD-10-CM | POA: Diagnosis not present

## 2016-04-21 DIAGNOSIS — E039 Hypothyroidism, unspecified: Secondary | ICD-10-CM | POA: Diagnosis not present

## 2016-04-21 DIAGNOSIS — E1042 Type 1 diabetes mellitus with diabetic polyneuropathy: Secondary | ICD-10-CM | POA: Diagnosis not present

## 2016-04-21 DIAGNOSIS — K635 Polyp of colon: Secondary | ICD-10-CM | POA: Diagnosis not present

## 2016-04-21 DIAGNOSIS — J431 Panlobular emphysema: Secondary | ICD-10-CM | POA: Diagnosis not present

## 2016-04-21 DIAGNOSIS — I10 Essential (primary) hypertension: Secondary | ICD-10-CM | POA: Diagnosis not present

## 2016-04-23 ENCOUNTER — Other Ambulatory Visit: Payer: Self-pay | Admitting: Internal Medicine

## 2016-04-29 DIAGNOSIS — E1065 Type 1 diabetes mellitus with hyperglycemia: Secondary | ICD-10-CM | POA: Diagnosis not present

## 2016-05-05 DIAGNOSIS — E1043 Type 1 diabetes mellitus with diabetic autonomic (poly)neuropathy: Secondary | ICD-10-CM | POA: Diagnosis not present

## 2016-05-05 DIAGNOSIS — Z79899 Other long term (current) drug therapy: Secondary | ICD-10-CM | POA: Diagnosis not present

## 2016-05-05 DIAGNOSIS — E039 Hypothyroidism, unspecified: Secondary | ICD-10-CM | POA: Diagnosis not present

## 2016-05-12 DIAGNOSIS — F17200 Nicotine dependence, unspecified, uncomplicated: Secondary | ICD-10-CM | POA: Diagnosis not present

## 2016-05-12 DIAGNOSIS — E1043 Type 1 diabetes mellitus with diabetic autonomic (poly)neuropathy: Secondary | ICD-10-CM | POA: Diagnosis not present

## 2016-05-12 DIAGNOSIS — Z9641 Presence of insulin pump (external) (internal): Secondary | ICD-10-CM | POA: Diagnosis not present

## 2016-05-12 DIAGNOSIS — E1065 Type 1 diabetes mellitus with hyperglycemia: Secondary | ICD-10-CM | POA: Diagnosis not present

## 2016-05-14 DIAGNOSIS — Z8371 Family history of colonic polyps: Secondary | ICD-10-CM | POA: Diagnosis not present

## 2016-05-14 DIAGNOSIS — Z86010 Personal history of colonic polyps: Secondary | ICD-10-CM | POA: Diagnosis not present

## 2016-07-01 ENCOUNTER — Encounter: Payer: Self-pay | Admitting: Internal Medicine

## 2016-07-02 DIAGNOSIS — E1142 Type 2 diabetes mellitus with diabetic polyneuropathy: Secondary | ICD-10-CM | POA: Diagnosis not present

## 2016-07-02 DIAGNOSIS — Z794 Long term (current) use of insulin: Secondary | ICD-10-CM | POA: Diagnosis not present

## 2016-07-02 DIAGNOSIS — B351 Tinea unguium: Secondary | ICD-10-CM | POA: Diagnosis not present

## 2016-07-08 ENCOUNTER — Other Ambulatory Visit: Payer: Self-pay | Admitting: Internal Medicine

## 2016-07-20 ENCOUNTER — Other Ambulatory Visit: Payer: Self-pay | Admitting: Internal Medicine

## 2016-07-30 DIAGNOSIS — E1065 Type 1 diabetes mellitus with hyperglycemia: Secondary | ICD-10-CM | POA: Diagnosis not present

## 2016-07-31 ENCOUNTER — Ambulatory Visit: Payer: PPO | Admitting: Internal Medicine

## 2016-07-31 ENCOUNTER — Encounter: Payer: Self-pay | Admitting: Internal Medicine

## 2016-08-03 ENCOUNTER — Encounter: Payer: Self-pay | Admitting: Internal Medicine

## 2016-08-03 ENCOUNTER — Ambulatory Visit (INDEPENDENT_AMBULATORY_CARE_PROVIDER_SITE_OTHER): Payer: PPO | Admitting: Internal Medicine

## 2016-08-03 VITALS — BP 140/68 | HR 70 | Ht 61.0 in | Wt 112.1 lb

## 2016-08-03 DIAGNOSIS — I2510 Atherosclerotic heart disease of native coronary artery without angina pectoris: Secondary | ICD-10-CM | POA: Diagnosis not present

## 2016-08-03 DIAGNOSIS — E785 Hyperlipidemia, unspecified: Secondary | ICD-10-CM | POA: Diagnosis not present

## 2016-08-03 LAB — CBC
HCT: 37.1 % (ref 35.0–45.0)
Hemoglobin: 12.2 g/dL (ref 11.7–15.5)
MCH: 30.4 pg (ref 27.0–33.0)
MCHC: 32.9 g/dL (ref 32.0–36.0)
MCV: 92.5 fL (ref 80.0–100.0)
Platelets: 66 10*3/uL — ABNORMAL LOW (ref 140–400)
RBC: 4.01 MIL/uL (ref 3.80–5.10)
RDW: 12.8 % (ref 11.0–15.0)
WBC: 5.1 10*3/uL (ref 3.8–10.8)

## 2016-08-03 NOTE — Patient Instructions (Signed)
Your physician recommends that you continue on your current medications as directed. Please refer to the Current Medication list given to you today. Your physician recommends that you return for lab work in: today (BMET, CBC, LIPID) Your physician wants you to follow-up in: 1 YEAR WITH DR ROSS.  You will receive a reminder letter in the mail two months in advance. If you don't receive a letter, please call our office to schedule the follow-up appointment.

## 2016-08-03 NOTE — Progress Notes (Addendum)
Cardiology Office Note   Date:  08/03/2016   ID:  Madeline [name], DOB Jul 15, 1934, MRN [id number]  PCP:  Marguarite Arbour, MD  Cardiologist:   Dietrich Pates, MD   F/U of CAD   History of Present Illness: Madeline [name] is a 82 y.o. female with a history of CAD, s/p inferior STEMI om 2012  She had occlusion of the RCA and underwent PTCA with DES to theprox RCA. It was a right dominant system with dual PDAs. Other vessels had irregularities. She was d/cd home on Brilinta.  SInce I saw her in Feb she has done OK  No CP  Breathing is stable  Continues to smoke      Outpatient Medications Prior to Visit  Medication Sig Dispense Refill  . aspirin 81 MG tablet Take 81 mg by mouth daily.      Marland Kitchen atorvastatin (LIPITOR) 20 MG tablet TAKE ONE TABLET BY MOUTH ONCE DAILY 90 tablet 3  . citalopram (CELEXA) 20 MG tablet Take 20 mg by mouth daily.      . insulin glargine (LANTUS) 100 UNIT/ML injection Inject 15 Units into the skin at bedtime.     . insulin lispro (HUMALOG) 100 UNIT/ML injection Inject into the skin 3 (three) times daily before meals. 5 TO 8 UNITS    . levothyroxine (SYNTHROID, LEVOTHROID) 75 MCG tablet Take 75 mcg by mouth daily.      Marland Kitchen lisinopril (PRINIVIL,ZESTRIL) 10 MG tablet Take 10 mg by mouth daily. TAKE ONE TABLET BY MOUTH ONCE DAILY    . metoprolol tartrate (LOPRESSOR) 25 MG tablet TAKE ONE TABLET BY MOUTH TWICE DAILY 180 tablet 1  . nitroGLYCERIN (NITROSTAT) 0.4 MG SL tablet Place 0.4 mg under the tongue every 5 (five) minutes as needed.      . pantoprazole (PROTONIX) 40 MG tablet TAKE ONE TABLET BY MOUTH EVERY DAY 30 tablet 5  . pravastatin (PRAVACHOL) 40 MG tablet Take 40 mg by mouth daily.       No facility-administered medications prior to visit.      Allergies:   Darvon; Propoxyphene; and Sulfa antibiotics   Past Medical History:  Diagnosis Date  . Diabetes mellitus   . Dyslipidemia   . Hyperthyroidism     Past Surgical History:  Procedure Laterality  Date  . BREAST BIOPSY Right 1967   EXCISIONAL - NEG  . no surgical hx       Social History:  The patient  reports that she has been smoking Cigarettes.  She has been smoking about 1.00 pack per day. She has never used smokeless tobacco. She reports that she does not drink alcohol or use drugs.   Family History:  The patient's family history includes Breast cancer (age of onset: 55) in her sister; Breast cancer (age of onset: 44) in her sister; Cancer in her sister, sister, and sister; Diabetes in her son; Heart Problems in her mother; Other in her father.    ROS:  Please see the history of present illness. All other systems are reviewed and  Negative to the above problem except as noted.    PHYSICAL EXAM: VS:  BP 140/68   Pulse 70   Ht 5\' 1"  (1.549 m)   Wt 112 lb 1.9 oz (50.9 kg)   BMI 21.18 kg/m   GEN: Well nourished, well developed, in no acute distress  HEENT: normal  Neck: no JVD, carotid bruits, or masses Cardiac: RRR; no murmurs, rubs, or gallops,no edema  Respiratory:  clear  to auscultation bilaterally, normal work of breathing GI: soft, nontender, nondistended, + BS  No hepatomegaly  MS: no deformity Moving all extremities   Skin: warm and dry, no rash Neuro:  Strength and sensation are intact Psych: euthymic mood, full affect   EKG:  EKG ist ordered today.  SR 70 bpm  LVH  QRS widening   Septal MI     Lipid Panel    Component Value Date/Time   CHOL 109 06/10/2015 0954   TRIG 66.0 06/10/2015 0954   HDL 46.20 06/10/2015 0954   CHOLHDL 2 06/10/2015 0954   VLDL 13.2 06/10/2015 0954   LDLCALC 50 06/10/2015 0954      Wt Readings from Last 3 Encounters:  08/03/16 112 lb 1.9 oz (50.9 kg)  01/10/16 110 lb 6.4 oz (50.1 kg)  06/10/15 127 lb (57.6 kg)      ASSESSMENT AND PLAN:  1  CAD  No sympotms to sugg angian.  2.  Tob  COunselled again on cessation  3  HL  Will follow labs    F/U in 1 year  Sooner for sympotms     Current medicines are reviewed at  length with the patient today.  The patient does not have concerns regarding medicines. Signed, Dietrich PatesPaula Jamez Ambrocio, MD  08/03/2016 3:08 PM    [facility] [address], [place], [place]  [address] Phone: [phone]; Fax: [phone]

## 2016-08-04 LAB — BASIC METABOLIC PANEL
BUN: 9 mg/dL (ref 7–25)
CO2: 27 mmol/L (ref 20–31)
Calcium: 8.2 mg/dL — ABNORMAL LOW (ref 8.6–10.4)
Chloride: 103 mmol/L (ref 98–110)
Creat: 0.73 mg/dL (ref 0.60–0.88)
Glucose, Bld: 239 mg/dL — ABNORMAL HIGH (ref 65–99)
Potassium: 4.4 mmol/L (ref 3.5–5.3)
Sodium: 139 mmol/L (ref 135–146)

## 2016-08-04 LAB — LIPID PANEL
Cholesterol: 112 mg/dL — ABNORMAL LOW (ref 125–200)
HDL: 67 mg/dL (ref >=46)
LDL Cholesterol: 26 mg/dL (ref <130)
Total CHOL/HDL Ratio: 1.7 Ratio (ref <=5.0)
Triglycerides: 94 mg/dL (ref <150)
VLDL: 19 mg/dL (ref <30)

## 2016-08-06 ENCOUNTER — Telehealth: Payer: Self-pay | Admitting: *Deleted

## 2016-08-06 DIAGNOSIS — D696 Thrombocytopenia, unspecified: Secondary | ICD-10-CM

## 2016-08-06 NOTE — Telephone Encounter (Signed)
Reviewed labs.  Reviewed medications with patient using her bottles.  She does not have pravastatin. She will come next Wednesday to repeat her CBC to look at platelets.

## 2016-08-06 NOTE — Telephone Encounter (Signed)
-----   Message from Pricilla RifflePaula V Ross, MD sent at 08/05/2016  9:55 PM EDT ----- Glucoses is high  Otherwise electrolytes are OK Plts are Low  Repeat CBC to confirm  ? Clumping  LDL is excellent  Please confirm is she on lipitor and pravastatin  If so then stop pravastatin

## 2016-08-07 DIAGNOSIS — E1065 Type 1 diabetes mellitus with hyperglycemia: Secondary | ICD-10-CM | POA: Diagnosis not present

## 2016-08-12 ENCOUNTER — Other Ambulatory Visit: Payer: PPO | Admitting: *Deleted

## 2016-08-12 DIAGNOSIS — D696 Thrombocytopenia, unspecified: Secondary | ICD-10-CM

## 2016-08-12 LAB — CBC
HCT: 34.9 % — ABNORMAL LOW (ref 35.0–45.0)
Hemoglobin: 11.8 g/dL (ref 11.7–15.5)
MCH: 30.7 pg (ref 27.0–33.0)
MCHC: 33.8 g/dL (ref 32.0–36.0)
MCV: 90.9 fL (ref 80.0–100.0)
Platelets: 65 10*3/uL — ABNORMAL LOW (ref 140–400)
RBC: 3.84 MIL/uL (ref 3.80–5.10)
RDW: 12.9 % (ref 11.0–15.0)
WBC: 5.4 10*3/uL (ref 3.8–10.8)

## 2016-08-14 DIAGNOSIS — Z9641 Presence of insulin pump (external) (internal): Secondary | ICD-10-CM | POA: Diagnosis not present

## 2016-08-14 DIAGNOSIS — E1043 Type 1 diabetes mellitus with diabetic autonomic (poly)neuropathy: Secondary | ICD-10-CM | POA: Diagnosis not present

## 2016-08-14 DIAGNOSIS — F17200 Nicotine dependence, unspecified, uncomplicated: Secondary | ICD-10-CM | POA: Diagnosis not present

## 2016-08-14 DIAGNOSIS — E1065 Type 1 diabetes mellitus with hyperglycemia: Secondary | ICD-10-CM | POA: Diagnosis not present

## 2016-08-20 ENCOUNTER — Other Ambulatory Visit: Payer: Self-pay | Admitting: *Deleted

## 2016-08-20 DIAGNOSIS — D691 Qualitative platelet defects: Secondary | ICD-10-CM

## 2016-08-20 NOTE — Progress Notes (Signed)
SEE LAB NOTE 08/20/16.

## 2016-09-10 ENCOUNTER — Emergency Department: Payer: PPO

## 2016-09-10 ENCOUNTER — Encounter: Payer: Self-pay | Admitting: Emergency Medicine

## 2016-09-10 ENCOUNTER — Emergency Department
Admission: EM | Admit: 2016-09-10 | Discharge: 2016-09-10 | Disposition: A | Discharge status: Home / Self Care | Payer: PPO | Attending: Emergency Medicine | Admitting: Emergency Medicine

## 2016-09-10 DIAGNOSIS — R0789 Other chest pain: Secondary | ICD-10-CM | POA: Insufficient documentation

## 2016-09-10 DIAGNOSIS — R1110 Vomiting, unspecified: Secondary | ICD-10-CM | POA: Diagnosis not present

## 2016-09-10 DIAGNOSIS — Z794 Long term (current) use of insulin: Secondary | ICD-10-CM | POA: Insufficient documentation

## 2016-09-10 DIAGNOSIS — E1165 Type 2 diabetes mellitus with hyperglycemia: Secondary | ICD-10-CM | POA: Diagnosis not present

## 2016-09-10 DIAGNOSIS — Z79899 Other long term (current) drug therapy: Secondary | ICD-10-CM | POA: Diagnosis not present

## 2016-09-10 DIAGNOSIS — Z7982 Long term (current) use of aspirin: Secondary | ICD-10-CM | POA: Diagnosis not present

## 2016-09-10 DIAGNOSIS — F17210 Nicotine dependence, cigarettes, uncomplicated: Secondary | ICD-10-CM | POA: Insufficient documentation

## 2016-09-10 DIAGNOSIS — I2510 Atherosclerotic heart disease of native coronary artery without angina pectoris: Secondary | ICD-10-CM | POA: Insufficient documentation

## 2016-09-10 DIAGNOSIS — E860 Dehydration: Secondary | ICD-10-CM | POA: Diagnosis not present

## 2016-09-10 DIAGNOSIS — R739 Hyperglycemia, unspecified: Secondary | ICD-10-CM

## 2016-09-10 LAB — URINALYSIS COMPLETE WITH MICROSCOPIC (ARMC ONLY)
Bacteria, UA: NONE SEEN
Bilirubin Urine: NEGATIVE
Glucose, UA: >500 mg/dL — AB
Nitrite: NEGATIVE
Protein, ur: NEGATIVE mg/dL
Specific Gravity, Urine: 1.023 (ref 1.005–1.030)
pH: 6 (ref 5.0–8.0)

## 2016-09-10 LAB — CBC WITH DIFFERENTIAL/PLATELET
Basophils Absolute: 0 10*3/uL (ref 0–0.1)
Basophils Relative: 0 %
Eosinophils Absolute: 0 10*3/uL (ref 0–0.7)
Eosinophils Relative: 0 %
HCT: 42.9 % (ref 35.0–47.0)
Hemoglobin: 13.8 g/dL (ref 12.0–16.0)
Lymphocytes Relative: 12 %
Lymphs Abs: 1.1 10*3/uL (ref 1.0–3.6)
MCH: 30.7 pg (ref 26.0–34.0)
MCHC: 32.3 g/dL (ref 32.0–36.0)
MCV: 95.3 fL (ref 80.0–100.0)
Monocytes Absolute: 0.2 10*3/uL (ref 0.2–0.9)
Monocytes Relative: 2 %
Neutro Abs: 7.7 10*3/uL — ABNORMAL HIGH (ref 1.4–6.5)
Neutrophils Relative %: 86 %
Platelets: 53 10*3/uL — ABNORMAL LOW (ref 150–440)
RBC: 4.5 MIL/uL (ref 3.80–5.20)
RDW: 13.1 % (ref 11.5–14.5)
WBC: 9 10*3/uL (ref 3.6–11.0)

## 2016-09-10 LAB — COMPREHENSIVE METABOLIC PANEL
ALT: 34 U/L (ref 14–54)
AST: 42 U/L — ABNORMAL HIGH (ref 15–41)
Albumin: 3.8 g/dL (ref 3.5–5.0)
Alkaline Phosphatase: 73 U/L (ref 38–126)
Anion gap: 12 (ref 5–15)
BUN: 19 mg/dL (ref 6–20)
CO2: 26 mmol/L (ref 22–32)
Calcium: 9.1 mg/dL (ref 8.9–10.3)
Chloride: 96 mmol/L — ABNORMAL LOW (ref 101–111)
Creatinine, Ser: 0.98 mg/dL (ref 0.44–1.00)
GFR calc Af Amer: >60 mL/min (ref >60)
GFR calc non Af Amer: 53 mL/min — ABNORMAL LOW (ref >60)
Glucose, Bld: 686 mg/dL (ref 65–99)
Potassium: 5.5 mmol/L — ABNORMAL HIGH (ref 3.5–5.1)
Sodium: 134 mmol/L — ABNORMAL LOW (ref 135–145)
Total Bilirubin: 1.3 mg/dL — ABNORMAL HIGH (ref 0.3–1.2)
Total Protein: 6.4 g/dL — ABNORMAL LOW (ref 6.5–8.1)

## 2016-09-10 LAB — GLUCOSE, CAPILLARY
Glucose-Capillary: >600 mg/dL (ref 65–99)
Glucose-Capillary: 481 mg/dL — ABNORMAL HIGH (ref 65–99)
Glucose-Capillary: 338 mg/dL — ABNORMAL HIGH (ref 65–99)

## 2016-09-10 LAB — TROPONIN I: Troponin I: <0.03 ng/mL (ref <0.03)

## 2016-09-10 LAB — LIPASE, BLOOD: Lipase: 29 U/L (ref 11–51)

## 2016-09-10 MED ORDER — INSULIN ASPART 100 UNIT/ML ~~LOC~~ SOLN
6.0000 [IU] | Freq: Once | SUBCUTANEOUS | Status: AC
  Administered 2016-09-10: 6 [IU] via INTRAVENOUS
  Filled 2016-09-10: qty 6

## 2016-09-10 MED ORDER — SODIUM CHLORIDE 0.9 % IV BOLUS (SEPSIS)
2000.0000 mL | Freq: Once | INTRAVENOUS | Status: AC
  Administered 2016-09-10: 2000 mL via INTRAVENOUS

## 2016-09-10 MED ORDER — INSULIN ASPART 100 UNIT/ML ~~LOC~~ SOLN
10.0000 [IU] | Freq: Once | SUBCUTANEOUS | Status: AC
  Administered 2016-09-10: 10 [IU] via INTRAVENOUS
  Filled 2016-09-10: qty 10

## 2016-09-10 NOTE — ED Notes (Signed)
CBG done. 481 blood sugar.

## 2016-09-10 NOTE — ED Notes (Signed)
Patient transported to X-ray 

## 2016-09-10 NOTE — ED Provider Notes (Signed)
[facility] Emergency Department Provider Note  ____________________________________________  Time seen: Approximately 10:43 AM  I have reviewed the triage vital signs and the nursing notes.   HISTORY  Chief Complaint Hyperglycemia    HPI Madeline [name] is a 82 y.o. female who complains of elevated blood sugar, chest pressure, and vomiting this morning. She is a type I diabetic with an insulin pump. There is some confusion about what the proper position of her needle attachment is, but it appears that she has it in the off position and may not a been receiving her insulin. Blood sugars reading is greater than 600 today. Chest pressure is not exertional. Nonradiating. No shortness of breath or diaphoresis. No dizziness. Denies recent illness.     Past Medical History:  Diagnosis Date  . Diabetes mellitus   . Dyslipidemia   . Hyperthyroidism      Patient Active Problem List   Diagnosis Date Noted  . CAD (coronary artery disease) 08/14/2011  . Dyslipidemia 08/14/2011  . Tobacco abuse 08/14/2011     Past Surgical History:  Procedure Laterality Date  . BREAST BIOPSY Right 1967   EXCISIONAL - NEG  . no surgical hx       Prior to Admission medications   Medication Sig Start Date End Date Taking? Authorizing Provider  atorvastatin (LIPITOR) 20 MG tablet Take 20 mg by mouth daily at 6 PM.   Yes Historical Provider, MD  citalopram (CELEXA) 20 MG tablet Take 20 mg by mouth daily.     Yes Historical Provider, MD  insulin lispro (HUMALOG) 100 UNIT/ML injection Inject into the skin 3 (three) times daily before meals. Patient uses insulin pump   Yes Historical Provider, MD  levothyroxine (SYNTHROID, LEVOTHROID) 75 MCG tablet Take 75 mcg by mouth daily.     Yes Historical Provider, MD  lisinopril (PRINIVIL,ZESTRIL) 10 MG tablet Take 10 mg by mouth daily.  10/08/14  Yes Historical Provider, MD  metoprolol tartrate (LOPRESSOR) 25 MG tablet TAKE ONE TABLET  BY MOUTH TWICE DAILY 07/09/16  Yes Pricilla Riffle, MD  Multiple Vitamin (MULTIVITAMIN WITH MINERALS) TABS tablet Take 1 tablet by mouth daily.   Yes Historical Provider, MD  nitroGLYCERIN (NITROSTAT) 0.4 MG SL tablet Place 0.4 mg under the tongue every 5 (five) minutes as needed.     Yes Historical Provider, MD  aspirin 81 MG tablet Take 81 mg by mouth daily.      Historical Provider, MD  insulin glargine (LANTUS) 100 UNIT/ML injection Inject 15 Units into the skin at bedtime.     Historical Provider, MD     Allergies Darvon; Propoxyphene; and Sulfa antibiotics   Family History  Problem Relation Age of Onset  . Heart Problems Mother   . Other Father     UNK  . Cancer Sister     BREAST  . Breast cancer Sister 54  . Cancer Sister     BREAST  . Breast cancer Sister 29  . Cancer Sister     BREAST  . Diabetes Son     Social History Social History  Substance Use Topics  . Smoking status: Current Every Day Smoker    Packs/day: 1.00    Types: Cigarettes  . Smokeless tobacco: Never Used  . Alcohol use No    Review of Systems  Constitutional:   No fever or chills.  ENT:   No sore throat. No rhinorrhea. Cardiovascular:   Positive as above chest pain. Respiratory:   No dyspnea  or cough. Gastrointestinal:   Negative for abdominal pain, positive vomiting.  Genitourinary:   Negative for dysuria or difficulty urinating.  10-point ROS otherwise negative.  ____________________________________________   PHYSICAL EXAM:  VITAL SIGNS: ED Triage Vitals  Enc Vitals Group     BP 09/10/16 1008 (!) 124/53     Pulse Rate 09/10/16 1008 71     Resp 09/10/16 1008 18     Temp 09/10/16 1008 98.1 F (36.7 C)     Temp Source 09/10/16 1008 Oral     SpO2 09/10/16 1008 100 %     Weight 09/10/16 1009 112 lb (50.8 kg)     Height --      Head Circumference --      Peak Flow --      Pain Score 09/10/16 1009 2     Pain Loc --      Pain Edu? --      Excl. in GC? --     Vital signs  reviewed, nursing assessments reviewed.   Constitutional:   Alert and oriented. Well appearing and in no distress. Eyes:   No scleral icterus. No conjunctival pallor. PERRL. EOMI.  No nystagmus. ENT   Head:   Normocephalic and atraumatic.   Nose:   No congestion/rhinnorhea. No septal hematoma   Mouth/Throat:   Dry mucous membranes, no pharyngeal erythema. No peritonsillar mass.    Neck:   No stridor. No SubQ emphysema. No meningismus. Hematological/Lymphatic/Immunilogical:   No cervical lymphadenopathy. Cardiovascular:   RRR. Symmetric bilateral radial and DP pulses.  No murmurs.  Respiratory:   Normal respiratory effort without tachypnea nor retractions. Breath sounds are clear and equal bilaterally. No wheezes/rales/rhonchi. Gastrointestinal:   Soft and nontender. Non distended. There is no CVA tenderness.  No rebound, rigidity, or guarding. Genitourinary:   deferred Musculoskeletal:   Nontender with normal range of motion in all extremities. No joint effusions.  No lower extremity tenderness.  No edema. Neurologic:   Normal speech and language.  CN 2-10 normal. Motor grossly intact. No gross focal neurologic deficits are appreciated.  Skin:    Skin is warm, dry and intact. No rash noted.  No petechiae, purpura, or bullae.  ____________________________________________    LABS (pertinent positives/negatives) (all labs ordered are listed, but only abnormal results are displayed) Labs Reviewed  GLUCOSE, CAPILLARY - Abnormal; Notable for the following:       Result Value   Glucose-Capillary >600 (*)    All other components within normal limits  BLOOD GAS, VENOUS - Abnormal; Notable for the following:    pO2, Ven <31.0 (*)    All other components within normal limits  COMPREHENSIVE METABOLIC PANEL - Abnormal; Notable for the following:    Sodium 134 (*)    Potassium 5.5 (*)    Chloride 96 (*)    Glucose, Bld 686 (*)    Total Protein 6.4 (*)    AST 42 (*)    Total  Bilirubin 1.3 (*)    GFR calc non Af Amer 53 (*)    All other components within normal limits  URINALYSIS COMPLETEWITH MICROSCOPIC ([facility] ONLY) - Abnormal; Notable for the following:    Color, Urine YELLOW (*)    APPearance CLEAR (*)    Glucose, UA >500 (*)    Ketones, ur 1+ (*)    Hgb urine dipstick 1+ (*)    Leukocytes, UA TRACE (*)    Squamous Epithelial / LPF 0-5 (*)    All other components within normal limits  CBC WITH DIFFERENTIAL/PLATELET - Abnormal; Notable for the following:    Platelets 53 (*)    Neutro Abs 7.7 (*)    All other components within normal limits  GLUCOSE, CAPILLARY - Abnormal; Notable for the following:    Glucose-Capillary 481 (*)    All other components within normal limits  GLUCOSE, CAPILLARY - Abnormal; Notable for the following:    Glucose-Capillary 338 (*)    All other components within normal limits  LIPASE, BLOOD  TROPONIN I  CBG MONITORING, ED  CBG MONITORING, ED   ____________________________________________   EKG  Interpreted by me Normal sinus rhythm rate of 74. Left axis, normal intervals. Normal QRS ST segments and T waves. Voltage criteria for LVH in the high lateral leads.  ____________________________________________    RADIOLOGY    ____________________________________________   PROCEDURES Procedures  ____________________________________________   INITIAL IMPRESSION / ASSESSMENT AND PLAN / ED COURSE  Pertinent labs & imaging results that were available during my care of the patient were reviewed by me and considered in my medical decision making (see chart for details).  Patient presents with severe hyperglycemia, likely dehydrated. We'll give IV saline, IV insulin. Check labs. Low suspicion for ACS PE dissection or pneumothorax carditis or pneumonia.     Clinical Course  Value Comment By Time  pH, Ven: 7.30 VBG unremarkable Sharman Cheek, MD 10/26 1053    ----------------------------------------- 1:39 PM  on 09/10/2016 -----------------------------------------  Vital signs remained stable. Patient feels much better after IV fluids and glucose control. Most recently blood sugar improved to about 360. Patient given an additional 6 units of aspart. At this point we can reconnect the insulin pump, discharge home. She does feel good about this plan. She'll call her diabetes coordinator for reassessment of her pump settings. Review of her insulin pump log shows that she is about 25 units of insulin daily. ____________________________________________   FINAL CLINICAL IMPRESSION(S) / ED DIAGNOSES  Final diagnoses:  Hyperglycemia  Dehydration     Portions of this note were generated with dragon dictation software. Dictation errors may occur despite best attempts at proofreading.    Sharman Cheek, MD 09/10/16 1340

## 2016-09-10 NOTE — ED Notes (Signed)
Insulin pump reconnected. Patient is safe to be discharged at this time per verbal order from Dr. Scotty CourtStafford. Patient educated on side effects of hypo and hyperglycemia. Verbalizes understanding.

## 2016-09-10 NOTE — ED Notes (Signed)
Insulin pump is disconnected and at bedside. Husband at bedside. No further needs at this time. Will continue to monitor.

## 2016-09-10 NOTE — ED Triage Notes (Addendum)
Pt to ed with c/o elevated blood sugar today and vomiting this am.  Also reports "I feel heavy on my chest"

## 2016-09-11 ENCOUNTER — Ambulatory Visit: Admission: RE | Admit: 2016-09-11 | Payer: PPO | Source: Ambulatory Visit | Admitting: Unknown Physician Specialty

## 2016-09-11 ENCOUNTER — Encounter: Admission: RE | Payer: Self-pay | Source: Ambulatory Visit

## 2016-09-11 LAB — BLOOD GAS, VENOUS
Acid-Base Excess: 0.3 mmol/L (ref 0.0–2.0)
Bicarbonate: 28 mmol/L (ref 20.0–28.0)
FIO2: 0.21
Patient temperature: 37
pCO2, Ven: 57 mmHg (ref 44.0–60.0)
pH, Ven: 7.3 (ref 7.250–7.430)
pO2, Ven: <31 mmHg — CL (ref 32.0–45.0)

## 2016-09-11 SURGERY — COLONOSCOPY WITH PROPOFOL
Anesthesia: General

## 2016-09-17 ENCOUNTER — Other Ambulatory Visit: Payer: PPO | Admitting: *Deleted

## 2016-09-17 DIAGNOSIS — D691 Qualitative platelet defects: Secondary | ICD-10-CM | POA: Diagnosis not present

## 2016-09-17 LAB — CBC
HCT: 38.4 % (ref 35.0–45.0)
Hemoglobin: 12.6 g/dL (ref 11.7–15.5)
MCH: 30.4 pg (ref 27.0–33.0)
MCHC: 32.8 g/dL (ref 32.0–36.0)
MCV: 92.5 fL (ref 80.0–100.0)
MPV: 13.8 fL — ABNORMAL HIGH (ref 7.5–12.5)
Platelets: 87 10*3/uL — ABNORMAL LOW (ref 140–400)
RBC: 4.15 MIL/uL (ref 3.80–5.10)
RDW: 13.2 % (ref 11.0–15.0)
WBC: 6 10*3/uL (ref 3.8–10.8)

## 2016-09-22 ENCOUNTER — Telehealth: Payer: Self-pay | Admitting: Pediatrics

## 2016-09-22 DIAGNOSIS — D696 Thrombocytopenia, unspecified: Secondary | ICD-10-CM

## 2016-09-22 NOTE — Telephone Encounter (Signed)
amb referral made to hematology.

## 2016-09-22 NOTE — Telephone Encounter (Signed)
-----   Message from Pricilla RifflePaula V Ross, MD sent at 09/22/2016  9:22 AM EST ----- Platelets are abetter than  2 wks ago  Still low Recomm referral to hematology. Keep on same meds for now

## 2016-09-23 DIAGNOSIS — R350 Frequency of micturition: Secondary | ICD-10-CM | POA: Diagnosis not present

## 2016-09-28 DIAGNOSIS — E7800 Pure hypercholesterolemia, unspecified: Secondary | ICD-10-CM | POA: Diagnosis not present

## 2016-09-28 DIAGNOSIS — Z79899 Other long term (current) drug therapy: Secondary | ICD-10-CM | POA: Diagnosis not present

## 2016-09-28 DIAGNOSIS — J431 Panlobular emphysema: Secondary | ICD-10-CM | POA: Diagnosis not present

## 2016-09-28 DIAGNOSIS — I10 Essential (primary) hypertension: Secondary | ICD-10-CM | POA: Diagnosis not present

## 2016-09-28 DIAGNOSIS — D696 Thrombocytopenia, unspecified: Secondary | ICD-10-CM | POA: Diagnosis not present

## 2016-09-28 DIAGNOSIS — E039 Hypothyroidism, unspecified: Secondary | ICD-10-CM | POA: Diagnosis not present

## 2016-09-28 DIAGNOSIS — E1042 Type 1 diabetes mellitus with diabetic polyneuropathy: Secondary | ICD-10-CM | POA: Diagnosis not present

## 2016-09-28 DIAGNOSIS — Z0000 Encounter for general adult medical examination without abnormal findings: Secondary | ICD-10-CM | POA: Diagnosis not present

## 2016-10-28 ENCOUNTER — Encounter (HOSPITAL_COMMUNITY): Payer: PPO | Attending: Oncology | Admitting: Oncology

## 2016-10-28 ENCOUNTER — Encounter (HOSPITAL_COMMUNITY): Payer: PPO

## 2016-10-28 ENCOUNTER — Encounter (HOSPITAL_COMMUNITY): Payer: Self-pay | Admitting: Oncology

## 2016-10-28 VITALS — BP 121/60 | HR 63 | Temp 98.4°F | Resp 18 | Ht 61.5 in | Wt 111.3 lb

## 2016-10-28 DIAGNOSIS — D696 Thrombocytopenia, unspecified: Secondary | ICD-10-CM

## 2016-10-28 DIAGNOSIS — Z720 Tobacco use: Secondary | ICD-10-CM

## 2016-10-28 DIAGNOSIS — F17200 Nicotine dependence, unspecified, uncomplicated: Secondary | ICD-10-CM | POA: Diagnosis not present

## 2016-10-28 LAB — CBC WITH DIFFERENTIAL/PLATELET
Basophils Absolute: 0 10*3/uL (ref 0.0–0.1)
Basophils Relative: 1 %
Eosinophils Absolute: 0.1 10*3/uL (ref 0.0–0.7)
Eosinophils Relative: 2 %
HCT: 39.7 % (ref 36.0–46.0)
Hemoglobin: 13.3 g/dL (ref 12.0–15.0)
Lymphocytes Relative: 44 %
Lymphs Abs: 2.6 10*3/uL (ref 0.7–4.0)
MCH: 31.8 pg (ref 26.0–34.0)
MCHC: 33.5 g/dL (ref 30.0–36.0)
MCV: 95 fL (ref 78.0–100.0)
Monocytes Absolute: 0.3 10*3/uL (ref 0.1–1.0)
Monocytes Relative: 5 %
Neutro Abs: 2.9 10*3/uL (ref 1.7–7.7)
Neutrophils Relative %: 49 %
Platelets: 72 10*3/uL — ABNORMAL LOW (ref 150–400)
RBC: 4.18 MIL/uL (ref 3.87–5.11)
RDW: 12.8 % (ref 11.5–15.5)
WBC: 6 10*3/uL (ref 4.0–10.5)

## 2016-10-28 LAB — FERRITIN: Ferritin: 23 ng/mL (ref 11–307)

## 2016-10-28 LAB — COMPREHENSIVE METABOLIC PANEL
ALT: 22 U/L (ref 14–54)
AST: 24 U/L (ref 15–41)
Albumin: 3.5 g/dL (ref 3.5–5.0)
Alkaline Phosphatase: 68 U/L (ref 38–126)
Anion gap: 5 (ref 5–15)
BUN: 10 mg/dL (ref 6–20)
CO2: 30 mmol/L (ref 22–32)
Calcium: 9.2 mg/dL (ref 8.9–10.3)
Chloride: 103 mmol/L (ref 101–111)
Creatinine, Ser: 0.71 mg/dL (ref 0.44–1.00)
GFR calc Af Amer: >60 mL/min (ref >60)
GFR calc non Af Amer: >60 mL/min (ref >60)
Glucose, Bld: 97 mg/dL (ref 65–99)
Potassium: 5 mmol/L (ref 3.5–5.1)
Sodium: 138 mmol/L (ref 135–145)
Total Bilirubin: 0.5 mg/dL (ref 0.3–1.2)
Total Protein: 6.1 g/dL — ABNORMAL LOW (ref 6.5–8.1)

## 2016-10-28 LAB — RAPID HIV SCREEN (HIV 1/2 AB+AG)
HIV 1/2 Antibodies: NONREACTIVE
HIV-1 P24 Antigen - HIV24: NONREACTIVE

## 2016-10-28 LAB — IRON AND TIBC
Iron: 113 ug/dL (ref 28–170)
Saturation Ratios: 33 % — ABNORMAL HIGH (ref 10.4–31.8)
TIBC: 339 ug/dL (ref 250–450)
UIBC: 226 ug/dL

## 2016-10-28 LAB — FOLATE: Folate: 58.2 ng/mL (ref >5.9)

## 2016-10-28 LAB — VITAMIN B12: Vitamin B-12: 664 pg/mL (ref 180–914)

## 2016-10-28 NOTE — Progress Notes (Signed)
[facility] Hematology/Oncology Consultation   Name: Madeline NimrodShirley S [name]      MRN: [id number]    Date: 10/28/2016 Time:12:32 PM   REFERRING PHYSICIAN:  Dietrich PatesPaula Ross, MD (Cardiologist)  REASON FOR CONSULT:  Thrombocytopenia   DIAGNOSIS:  Thrombocytopenia, sudden/acute onset in July 2016.  Relatively stable since.  HISTORY OF PRESENT ILLNESS:   Madeline [name] is a 82 y.o. female with a medical history significant for CAD, dyslipidemia, tobacco abuse, depression, DM, insulin dependent, and hypothyroidism who is referred to the [facility] for thrombocytopenia.  She reports fatigue and tiredness x 1 year.  She otherwise denies any complaints.  I have reviewed her lab results with her.  In July 2016, she had an acute change in her platelet count from normal to mildly decreased.  Since July 2016, her platelet count slowly dropped to as low as 53,000 on 09/10/2016.  Labs on 09/17/2016 demonstrated a return of platelet count to 87,000.  Interestingly, despite a normal WBC, she does have a neutrophilia in October 2017.    She denies any B symptoms including fevers, chills, night sweats.  She reports a good appetite and no unintentional weight loss.  On chart review, her weight is down ~ 13 lbs since July 2016.    She admits to easy bruising, but no abnormal bruising on abdomen, back, thighs, chest, and joints.  All bruises are on distal extremities.  She denies nose bleeding or gum bleeding.  She denies any new lumps or bumps.  She has never had a blood clot.  She denies every having hepatitis or yellowing of eyes or skin.  She denies any known liver or spleen disease.    She is on an insulin pump for DM control. She denies new pain.  Review of Systems  Constitutional: Positive for malaise/fatigue. Negative for chills, fever and weight loss.  HENT: Negative.  Negative for nosebleeds.   Eyes: Negative.   Respiratory: Negative.  Negative for cough and hemoptysis.     Cardiovascular: Negative.  Negative for chest pain and leg swelling.  Gastrointestinal: Negative.  Negative for blood in stool, constipation, diarrhea, melena, nausea and vomiting.  Genitourinary: Negative.  Negative for hematuria.  Musculoskeletal: Negative.   Skin: Negative.  Negative for rash.  Neurological: Positive for weakness.  Endo/Heme/Allergies: Bruises/bleeds easily.  Psychiatric/Behavioral: Negative.   14 point review of systems was performed and is negative except as detailed under history of present illness and above   PAST MEDICAL HISTORY:   Past Medical History:  Diagnosis Date  . Diabetes mellitus   . Dyslipidemia   . Hyperthyroidism     ALLERGIES: Allergies  Allergen Reactions  . Darvon     "FEEL SICK"  . Propoxyphene Nausea Only and Nausea And Vomiting  . Sulfa Antibiotics     "FEEL SICK"       MEDICATIONS: I have reviewed the patient's current medications.    Current Outpatient Prescriptions on File Prior to Visit  Medication Sig Dispense Refill  . aspirin 81 MG tablet Take 81 mg by mouth daily.      Marland Kitchen. atorvastatin (LIPITOR) 20 MG tablet Take 20 mg by mouth daily at 6 PM.    . citalopram (CELEXA) 20 MG tablet Take 20 mg by mouth daily.      . insulin glargine (LANTUS) 100 UNIT/ML injection Inject 15 Units into the skin at bedtime.     . insulin lispro (HUMALOG) 100 UNIT/ML injection Inject  into the skin 3 (three) times daily before meals. Patient uses insulin pump    . levothyroxine (SYNTHROID, LEVOTHROID) 75 MCG tablet Take 75 mcg by mouth daily.      Marland Kitchen lisinopril (PRINIVIL,ZESTRIL) 10 MG tablet Take 10 mg by mouth daily.     . metoprolol tartrate (LOPRESSOR) 25 MG tablet TAKE ONE TABLET BY MOUTH TWICE DAILY 180 tablet 1  . Multiple Vitamin (MULTIVITAMIN WITH MINERALS) TABS tablet Take 1 tablet by mouth daily.    . nitroGLYCERIN (NITROSTAT) 0.4 MG SL tablet Place 0.4 mg under the tongue every 5 (five) minutes as needed.       No current  facility-administered medications on file prior to visit.      PAST SURGICAL HISTORY Past Surgical History:  Procedure Laterality Date  . BREAST BIOPSY Right 1967   EXCISIONAL - NEG  . no surgical hx      FAMILY HISTORY: Family History  Problem Relation Age of Onset  . Heart Problems Mother   . Other Father     UNK  . Cancer Sister     BREAST  . Breast cancer Sister 21  . Cancer Sister     BREAST  . Breast cancer Sister 58  . Cancer Sister     BREAST  . Diabetes Son    Mother deceased at age 85 from complications of heart disease. Father passed at the age of 58's from stroke. She has 4 children. Son 41 yo with DM Son 50 yo healthy Daughter 72 yo with pacemaker Daughter 33 yo with headaches 8 siblings in total, 4 deceased from MVA, unknown cancer, and heart disease (x2).  SOCIAL HISTORY: She admits to a 55 pack year smoking history, 1 ppd x 55+ years.  She denies any EtOH or illicit drug use.  She is Control and instrumentation engineer in religion.  She is retired from [employer] work.  She is married x 63 years.  Social History   Social History  . Marital status: Married    Spouse name: N/A  . Number of children: N/A  . Years of education: N/A   Social History Main Topics  . Smoking status: Current Every Day Smoker    Packs/day: 1.00    Types: Cigarettes  . Smokeless tobacco: Never Used  . Alcohol use No  . Drug use: No  . Sexual activity: Not Asked   Other Topics Concern  . None   Social History Narrative  . None    PERFORMANCE STATUS: The patient's performance status is 1 - Symptomatic but completely ambulatory  PHYSICAL EXAM: Most Recent Vital Signs: Blood pressure 121/60, pulse 63, temperature 98.4 F (36.9 C), temperature source Oral, resp. rate 18, height 5' 1.5" (1.562 m), weight 111 lb 4.8 oz (50.5 kg), SpO2 97 %. General appearance: alert, cooperative, appears stated age, no distress and smellling of tobacco. Head: Normocephalic, without obvious abnormality,  atraumatic Eyes: negative findings: lids and lashes normal, conjunctivae and sclerae normal and corneas clear Ears: hearing aids Nose: Nares normal. Septum midline. Mucosa normal. No drainage or sinus tenderness., no crusting or bleeding points Throat: normal findings: lips normal without lesions, tongue midline and normal and oropharynx pink & moist without lesions or evidence of thrush and abnormal findings: dentition: upper and lower dentures Neck: no adenopathy, supple, symmetrical, trachea midline and thyroid not enlarged, symmetric, no tenderness/mass/nodules Lungs: clear to auscultation bilaterally and normal percussion bilaterally Heart: regular rate and rhythm, S1, S2 normal, no murmur, click, rub or gallop Abdomen: soft, non-tender; bowel  sounds normal; no masses,  no organomegaly Extremities: extremities normal, atraumatic, no cyanosis or edema Skin: Skin color, texture, turgor normal. No rashes or lesions Lymph nodes: Cervical, supraclavicular, and axillary nodes normal. Neurologic: Grossly normal  LABORATORY DATA:  CBC    Component Value Date/Time   WBC 6.0 09/17/2016 0956   RBC 4.15 09/17/2016 0956   HGB 12.6 09/17/2016 0956   HCT 38.4 09/17/2016 0956   PLT 87 (L) 09/17/2016 0956   MCV 92.5 09/17/2016 0956   MCH 30.4 09/17/2016 0956   MCHC 32.8 09/17/2016 0956   RDW 13.2 09/17/2016 0956   LYMPHSABS 1.1 09/10/2016 1036   MONOABS 0.2 09/10/2016 1036   EOSABS 0.0 09/10/2016 1036   BASOSABS 0.0 09/10/2016 1036   Results for Madeline [name], Madeline [name] (MRN [id number]) as of 10/28/2016 11:57  Ref. Range 07/18/2011 05:00 07/19/2011 07:06 10/22/2014 13:26 06/10/2015 09:54 08/03/2016 15:19 08/12/2016 10:48 09/10/2016 10:36 09/17/2016 09:56  Platelets Latest Ref Range: 140 - 400 K/uL 252 224 185.0 97.0 (L) 66 (L) 65 (L) 53 (L) 87 (L)    RADIOGRAPHY: No results found.     PATHOLOGY:  N/A  ASSESSMENT/PLAN:   Thrombocytopenia  Tobacco Abuse  Thrombocytopenia dating back to July 2016 with  an acute change in platelet count, stable since.    Medications are reviewed.  No known iatrogenic cause (medication). No significant bleeding or bruising. No B symptoms.   Degrees of thrombocytopenia can be divided into mild (platelet count 100,000 to 150,000/microL), moderate (50,000 to 99,000/microL), and severe (<50,000/microL). Severe thrombocytopenia confers a greater risk of bleeding, but the correlation between platelet count and bleeding risk varies according to the underlying condition and may be unpredictable.  Asymptomatic, incidental findings, mild thrombocytopenia is commonly caused by immune thrombocytopenia (ITP), occult liver disease, HIV infection, and myelodysplastic syndromes.  Congenital thrombocytopenic conditions, sometimes misdiagnosed as ITP, may also occur.Does not have a known history of occult liver disease. Her other blood counts do not lead [place] to suspect an MDS. New onset thrombocytopenia rules out the likelihood of congenital thrombocytopenia. She does not give a history that would suggest HIV or HCV infection although it is felt that patients should be tested with a new onset thrombocytopenia.   Labs today: CBC diff, CMET, peripheral smear review, HIV screen (HIV 1/2 Ab+Ag), hepatitis C antibody  iron/TIBC, ferritin, vitamin B12, folate, serum copper, SPEP + IFE, light chain assay.  Will perform an [place] of abdomen to evaluate for liver disease and splenomegaly.  If hepatitis C antibody is positive, then Hepatitis C RNA testing is indicated.   If HIV 1/2 immunoassay is positive, then HIV-1/HIV-2 antibody differentiation immunoassay is necessary to determine type of HIV infection.  If negative or indeterminate, then HIV RNA testing is needed to determine acute HIV1 infection versus negative for HIV.  Labs in 4 weeks: CBC diff.  No indication for bone marrow aspiration and biopsy at this time.    Return in 4 weeks for follow-up to review the above and for additional  recommendations.   Smoking cessation was discussed today.    ORDERS PLACED FOR THIS ENCOUNTER: Orders Placed This Encounter  Procedures  . US Abdomen Complete  . CBC with Differential  . Comprehensive metabolic panel  . Pathologist smear review  . Copper, serum  . Hepatitis C Antibody  . Rapid HIV screen (HIV 1/2 Ab+Ag)  . Vitamin B12  . Folate  . Iron and TIBC  . Ferritin  . IgG, IgA, IgM  . Kappa/lambda light chains  .  Immunofixation electrophoresis  . Protein electrophoresis, serum    All questions were answered. The patient knows to call the clinic with any problems, questions or concerns. We can certainly see the patient much sooner if necessary.  This note is electronically signed [id number]:Kaevon Cotta Baxter HireKristen, MD  10/28/2016 12:32 PM

## 2016-10-28 NOTE — Patient Instructions (Addendum)
[facility] at [facility]  Discharge Instructions:  Exam and discussion by Jenita Seashoreom Kefalas today Lab work today.  Stop by the main entrance and check in to have lab work done.  Ultrasound of abdomen within 4 weeks  Return to see the doctor in 1 month Please call the clinic if you have any questions or concerns Please see Amy for your appointments.   _______________________________________________________________  Thank you for choosing [facility] at [facility] to provide your oncology and hematology care.  To afford each patient quality time with our providers, please arrive at least 15 minutes before your scheduled appointment.  You need to re-schedule your appointment if you arrive 10 or more minutes late.  We strive to give you quality time with our providers, and arriving late affects you and other patients whose appointments are after yours.  Also, if you no show three or more times for appointments you may be dismissed from the clinic.  Again, thank you for choosing [facility] at [facility]. Our hope is that these requests will allow you access to exceptional care and in a timely manner. _______________________________________________________________  If you have questions after your visit, please contact our office at [phone] between the hours of 8:30 a.m. and 5:00 p.m. Voicemails left after 4:30 p.m. will not be returned until the following business day. _______________________________________________________________  For prescription refill requests, have your pharmacy contact our office. _______________________________________________________________  Recommendations made by the consultant and any test results will be sent to your referring physician. _______________________________________________________________

## 2016-10-28 NOTE — Assessment & Plan Note (Addendum)
Thrombocytopenia dating back to July 2016 with an acute change in platelet count, stable since.    Medications are reviewed.  No known iatrogenic cause (medication).  Degrees of thrombocytopenia can be divided into mild (platelet count 100,000 to 150,000/microL), moderate (50,000 to 99,000/microL), and severe (<50,000/microL). Severe thrombocytopenia confers a greater risk of bleeding, but the correlation between platelet count and bleeding risk varies according to the underlying condition and may be unpredictable.  Asymptomatic, incidental findings, mild thrombocytopenia is commonly caused by immune thrombocytopenia (ITP), occult liver disease, HIV infection, and myelodysplastic syndromes.  Congenital thrombocytopenic conditions, sometimes misdiagnosed as ITP, may also occur.  In a patient with incidentally discovered asymptomatic thrombocytopenia and a probable diagnosis of ITP, no further evaluation beyond routine history, physical examination, CBC, review of peripheral blood smear, testing for HIV and Hepatitis C is necessary.  Anti-platelet antibody studies are not routinely done and imaging studies and bone marrow aspiration and biopsy are not necessary unless other abnormalities are present.  The natural history of asymptomatic, mild thrombocytopenia was studied prospectively in 217 apparently healthy individuals referred to a hematology center for incidentally discovered platelet counts between 100,000 and 150,000/microL [27]. At six months of observation, 23 (11 percent) had normal platelet counts, two developed a myelodysplastic syndrome (refractory anemia), and one developed systemic lupus erythematosus. The remaining 191 individuals (88 percent) had persistent platelet counts <150,000/microL during this period without other signs of disease becoming evident; after five years, most (64 percent) had spontaneous resolution or persistent mild thrombocytopenia without development of an associated  condition, supporting a diagnosis of ITP.  Labs today: CBC diff, CMET, peripheral smear review, HIV screen (HIV 1/2 Ab+Ag), hepatitis C antibody [in addition to HCV RNA testing in those who have a greater likelihood of false negative antibody testing (ie severely immunocompromised or hemodialysis patients or those suspected of having acute hepatitis C)], iron/TIBC, ferritin, vitamin B12, folate, serum copper, SPEP + IFE, light chain assay.  Will perform an US of abdomen to evaluate for liver disease and splenomegaly.  If hepatitis C antibody is positive, then Hepatitis C RNA testing is indicated.  If HCV RNA is detected, the diagnosis of HCV infection is confirmed.  If HCV RNA is not detected, then a reactive antibody likely represents either a past HCV infection that subsequently was cleared of a false-positive antibody test.  If HIV 1/2 immunoassay is positive, then HIV-1/HIV-2 antibody differentiation immunoassay is necessary to determine type of HIV infection.  If negative or indeterminate, then HIV RNA testing is needed to determine acute HIV1 infection versus negative for HIV.  Labs in 4 weeks: CBC diff.  No indication for bone marrow aspiration and biopsy at this time.   I did broach the topic that there may be a future need for bone marrow aspiration and biopsy as it may be necessary to evaluate bone marrow if peripheral work-up is unrevealing.  Return in 4 weeks for follow-up.

## 2016-10-29 DIAGNOSIS — E1065 Type 1 diabetes mellitus with hyperglycemia: Secondary | ICD-10-CM | POA: Diagnosis not present

## 2016-10-29 LAB — KAPPA/LAMBDA LIGHT CHAINS
Kappa free light chain: 19.6 mg/L — ABNORMAL HIGH (ref 3.3–19.4)
Kappa, lambda light chain ratio: 0.89 (ref 0.26–1.65)
Lambda free light chains: 21.9 mg/L (ref 5.7–26.3)

## 2016-10-29 LAB — IGG, IGA, IGM
IgA: 161 mg/dL (ref 64–422)
IgG (Immunoglobin G), Serum: 595 mg/dL — ABNORMAL LOW (ref 700–1600)
IgM, Serum: 95 mg/dL (ref 26–217)

## 2016-10-29 LAB — IMMUNOFIXATION ELECTROPHORESIS
IgA: 161 mg/dL (ref 64–422)
IgG (Immunoglobin G), Serum: 621 mg/dL — ABNORMAL LOW (ref 700–1600)
IgM, Serum: 96 mg/dL (ref 26–217)
Total Protein ELP: 5.8 g/dL — ABNORMAL LOW (ref 6.0–8.5)

## 2016-10-29 LAB — PATHOLOGIST SMEAR REVIEW

## 2016-10-29 LAB — HEPATITIS C ANTIBODY: HCV Ab: <0.1 s/co ratio (ref 0.0–0.9)

## 2016-10-30 LAB — PROTEIN ELECTROPHORESIS, SERUM
A/G Ratio: 1.5 (ref 0.7–1.7)
Albumin ELP: 3.4 g/dL (ref 2.9–4.4)
Alpha-1-Globulin: 0.2 g/dL (ref 0.0–0.4)
Alpha-2-Globulin: 0.7 g/dL (ref 0.4–1.0)
Beta Globulin: 0.9 g/dL (ref 0.7–1.3)
Gamma Globulin: 0.6 g/dL (ref 0.4–1.8)
Globulin, Total: 2.3 g/dL (ref 2.2–3.9)
Total Protein ELP: 5.7 g/dL — ABNORMAL LOW (ref 6.0–8.5)

## 2016-10-30 LAB — COPPER, SERUM: Copper: 102 ug/dL (ref 72–166)

## 2016-11-01 ENCOUNTER — Encounter (HOSPITAL_COMMUNITY): Payer: Self-pay | Admitting: Oncology

## 2016-11-03 ENCOUNTER — Ambulatory Visit (HOSPITAL_COMMUNITY)
Admission: RE | Admit: 2016-11-03 | Discharge: 2016-11-03 | Disposition: A | Discharge status: Home / Self Care | Payer: PPO | Source: Ambulatory Visit | Attending: Oncology | Admitting: Oncology

## 2016-11-03 DIAGNOSIS — R161 Splenomegaly, not elsewhere classified: Secondary | ICD-10-CM | POA: Diagnosis not present

## 2016-11-03 DIAGNOSIS — D696 Thrombocytopenia, unspecified: Secondary | ICD-10-CM | POA: Diagnosis not present

## 2016-11-03 DIAGNOSIS — Z9049 Acquired absence of other specified parts of digestive tract: Secondary | ICD-10-CM | POA: Insufficient documentation

## 2016-11-03 DIAGNOSIS — I700 Atherosclerosis of aorta: Secondary | ICD-10-CM | POA: Insufficient documentation

## 2016-11-18 ENCOUNTER — Encounter: Payer: PPO | Admitting: Hematology

## 2016-11-18 DIAGNOSIS — E039 Hypothyroidism, unspecified: Secondary | ICD-10-CM | POA: Diagnosis not present

## 2016-11-18 DIAGNOSIS — Z79899 Other long term (current) drug therapy: Secondary | ICD-10-CM | POA: Diagnosis not present

## 2016-11-18 DIAGNOSIS — E7800 Pure hypercholesterolemia, unspecified: Secondary | ICD-10-CM | POA: Diagnosis not present

## 2016-11-18 DIAGNOSIS — E1065 Type 1 diabetes mellitus with hyperglycemia: Secondary | ICD-10-CM | POA: Diagnosis not present

## 2016-11-18 DIAGNOSIS — I10 Essential (primary) hypertension: Secondary | ICD-10-CM | POA: Diagnosis not present

## 2016-11-24 ENCOUNTER — Ambulatory Visit (HOSPITAL_COMMUNITY): Payer: PPO

## 2016-11-24 DIAGNOSIS — Z9641 Presence of insulin pump (external) (internal): Secondary | ICD-10-CM | POA: Diagnosis not present

## 2016-11-24 DIAGNOSIS — E1065 Type 1 diabetes mellitus with hyperglycemia: Secondary | ICD-10-CM | POA: Diagnosis not present

## 2016-11-24 DIAGNOSIS — E1043 Type 1 diabetes mellitus with diabetic autonomic (poly)neuropathy: Secondary | ICD-10-CM | POA: Diagnosis not present

## 2016-11-24 DIAGNOSIS — F17200 Nicotine dependence, unspecified, uncomplicated: Secondary | ICD-10-CM | POA: Diagnosis not present

## 2016-11-30 ENCOUNTER — Ambulatory Visit (HOSPITAL_COMMUNITY): Payer: PPO | Admitting: Hematology & Oncology

## 2016-12-07 ENCOUNTER — Encounter (HOSPITAL_COMMUNITY): Payer: PPO | Attending: Hematology & Oncology | Admitting: Adult Health

## 2016-12-07 ENCOUNTER — Encounter (HOSPITAL_COMMUNITY): Payer: Self-pay | Admitting: Adult Health

## 2016-12-07 VITALS — BP 153/53 | HR 64 | Temp 98.0°F | Resp 18 | Wt 112.4 lb

## 2016-12-07 DIAGNOSIS — F17200 Nicotine dependence, unspecified, uncomplicated: Secondary | ICD-10-CM

## 2016-12-07 DIAGNOSIS — D696 Thrombocytopenia, unspecified: Secondary | ICD-10-CM

## 2016-12-07 DIAGNOSIS — Z720 Tobacco use: Secondary | ICD-10-CM

## 2016-12-07 NOTE — Patient Instructions (Addendum)
[facility] at [facility] Discharge Instructions  RECOMMENDATIONS MADE BY THE CONSULTANT AND ANY TEST RESULTS WILL BE SENT TO YOUR REFERRING PHYSICIAN.  Exam with Lubertha BasqueGretchen Dawson, NP. Labs downstairs at the main entrance in 6 weeks. Return to the clinic in 3 months for follow up.    Thank you for choosing [facility] at [facility] to provide your oncology and hematology care.  To afford each patient quality time with our provider, please arrive at least 15 minutes before your scheduled appointment time.    If you have a lab appointment with the Cancer Center please come in thru the  Main Entrance and check in at the main information desk  You need to re-schedule your appointment should you arrive 10 or more minutes late.  We strive to give you quality time with our providers, and arriving late affects you and other patients whose appointments are after yours.  Also, if you no show three or more times for appointments you may be dismissed from the clinic at the providers discretion.     Again, thank you for choosing [facility].  Our hope is that these requests will decrease the amount of time that you wait before being seen by our physicians.       _____________________________________________________________  Should you have questions after your visit to [facility], please contact our office at [phone] between the hours of 8:30 a.m. and 4:30 p.m.  Voicemails left after 4:30 p.m. will not be returned until the following business day.  For prescription refill requests, have your pharmacy contact our office.       Resources For Cancer Patients and their Caregivers ? American Cancer Society: Can assist with transportation, wigs, general needs, runs Look Good Feel Better.        301-855-06391-209-863-4532 ? Cancer Care: Provides financial assistance, online support groups, medication/co-pay assistance.  1-800-813-HOPE  (534)192-2253(4673) ? [facility] Assists [place] cancer patients and their families through emotional , educational and financial support.  [phone] ? [place] DSS Where to apply for food stamps, Medicaid and utility assistance. 631 617 1173865-151-2079 ? [facility]: Transportation to medical appointments. 989-063-5435(336)746-0111 ? Social Security Administration: May apply for disability if have a Stage IV cancer. 8050885625(586)098-4484 (725)537-38961-209-195-8847 ? [place] [employer], Dis[employer] and Transit Services: Assists with nutrition, care and transit needs. 220-653-2410(929) 249-8094  Cancer Center Support Programs: @10RELATIVEDAYS @ > Cancer Support Group  2nd Tuesday of the month 1pm-2pm, Journey Room  > Creative Journey  3rd Tuesday of the month 1130am-1pm, Journey Room  > Look Good Feel Better  1st Wednesday of the month 10am-12 noon, Journey Room (Call American Cancer Society to register [phone])

## 2016-12-07 NOTE — Progress Notes (Signed)
[address] [place], [place] [address]     CLINIC:  Medical Oncology/Hematology  PRIMARY CARE PROVIDER: Idelle Crouch, MD  [place] [id number]  [phone]   REASON FOR VISIT:  Thrombocytopenia  CURRENT THERAPY:  Initial work-up   DIAGNOSIS:  Thrombocytopenia, sudden/acute onset in July 2016.  Relatively stable since.  HISTORY OF PRESENT ILLNESS:   Madeline [name] is a 83 y.o. female with a medical history significant for CAD, dyslipidemia, tobacco abuse, depression, DM, insulin dependent, and hypothyroidism who is referred to the [facility] for thrombocytopenia. In July 2016, she had an acute change in her platelet count from normal to mildly decreased.  Since July 2016, her platelet count slowly dropped to as low as 53,000 on 09/10/2016.  Labs on 09/17/2016 demonstrated a return of platelet count to 87,000.  Interestingly, despite a normal WBC, she does have a neutrophilia in October 2017.  On chart review, her weight is down ~ 13 lbs since July 2016.   INTERVAL HISTORY:  Madeline [name] presents to cancer center for continued follow-up for thrombocytopenia.  Chronic fatigue for the past 1 year.  Otherwise, she is largely without complaints.   Denies fever/chills.  Reports good appetite and energy levels.  She does endorse easy bruising to her arms and legs, but none to her abdomen, chest, or back.  Denies any abnormal bleeding including nose bleeds, bloody stools/melena, or bleeding from her gums.    She continues to smoke about 1 ppd. Her husband also uses tobacco products (chewing tobacco).     REVIEW OF SYSTEMS:  Review of Systems  Constitutional: Positive for malaise/fatigue. Negative for chills, fever and weight loss.  HENT: Negative.  Negative for nosebleeds.   Eyes: Negative.   Respiratory: Negative.  Negative for cough and hemoptysis.   Cardiovascular: Negative.  Negative for  chest pain and leg swelling.  Gastrointestinal: Negative.  Negative for blood in stool, constipation, diarrhea, melena, nausea and vomiting.  Genitourinary: Negative.  Negative for hematuria.  Musculoskeletal: Negative.   Skin: Negative.  Negative for rash.  Endo/Heme/Allergies: Bruises/bleeds easily.  Psychiatric/Behavioral: Negative.   14 point review of systems was performed and is negative except as detailed under history of present illness and above   PAST MEDICAL HISTORY:   Past Medical History:  Diagnosis Date  . Diabetes mellitus   . Dyslipidemia   . Hyperthyroidism     ALLERGIES: Allergies  Allergen Reactions  . Darvon     "FEEL SICK"  . Propoxyphene Nausea Only and Nausea And Vomiting  . Sulfa Antibiotics     "FEEL SICK"       MEDICATIONS: I have reviewed the patient's current medications.    Current Outpatient Prescriptions on File Prior to Visit  Medication Sig Dispense Refill  . aspirin 81 MG tablet Take 81 mg by mouth daily.      Marland Kitchen atorvastatin (LIPITOR) 20 MG tablet Take 20 mg by mouth daily at 6 PM.    . citalopram (CELEXA) 20 MG tablet Take 20 mg by mouth daily.      . insulin glargine (LANTUS) 100 UNIT/ML injection Inject 15 Units into the skin at bedtime.     . insulin lispro (HUMALOG) 100 UNIT/ML injection Inject into the skin 3 (three) times daily before meals. Patient uses insulin pump    . levothyroxine (SYNTHROID, LEVOTHROID) 75 MCG tablet Take 75 mcg by mouth daily.      Marland Kitchen  lisinopril (PRINIVIL,ZESTRIL) 10 MG tablet Take 10 mg by mouth daily.     . metoprolol tartrate (LOPRESSOR) 25 MG tablet TAKE ONE TABLET BY MOUTH TWICE DAILY 180 tablet 1  . Multiple Vitamin (MULTIVITAMIN WITH MINERALS) TABS tablet Take 1 tablet by mouth daily.    . nitroGLYCERIN (NITROSTAT) 0.4 MG SL tablet Place 0.4 mg under the tongue every 5 (five) minutes as needed.       No current facility-administered medications on file prior to visit.      PAST SURGICAL HISTORY Past  Surgical History:  Procedure Laterality Date  . BREAST BIOPSY Right 1967   EXCISIONAL - NEG  . no surgical hx      FAMILY HISTORY: Family History  Problem Relation Age of Onset  . Heart Problems Mother   . Other Father     UNK  . Cancer Sister     BREAST  . Breast cancer Sister 7  . Cancer Sister     BREAST  . Breast cancer Sister 14  . Cancer Sister     BREAST  . Diabetes Son    Mother deceased at age 25 from complications of heart disease. Father passed at the age of 9's from stroke. She has 4 children. Son 71 yo with DM Son 67 yo healthy Daughter 23 yo with pacemaker Daughter 66 yo with headaches 8 siblings in total, 4 deceased from [place], unknown cancer, and heart disease (x2).  SOCIAL HISTORY: She admits to a 55 pack year smoking history, 1 ppd x 55+ years.  She denies any EtOH or illicit drug use.  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  She is married x 63 years.  Social History   Social History  . Marital status: Married    Spouse name: N/A  . Number of children: N/A  . Years of education: N/A   Social History Main Topics  . Smoking status: Current Every Day Smoker    Packs/day: 1.00    Types: Cigarettes  . Smokeless tobacco: Never Used  . Alcohol use No  . Drug use: No  . Sexual activity: Not on file   Other Topics Concern  . Not on file   Social History Narrative  . No narrative on file    PERFORMANCE STATUS: The patient's performance status is 1 - Symptomatic but completely ambulatory  PHYSICAL EXAM: Vital Signs:  Vitals:   12/07/16 1130  BP: (!) 153/53  Pulse: 64  Resp: 18  Temp: 98 F (36.7 C)    Filed Weights   12/07/16 1130  Weight: 112 lb 6.4 oz (51 kg)    General: Female in no acute distress.  Accompanied by her husband.  HEENT: Head is normocephalic.  Pupils equal and reactive to light. Conjunctivae clear without exudate.  Sclerae anicteric. Oral mucosa is pink and moist without lesions. Oropharynx is  pink and moist without lesions. Lymph: No cervical, supraclavicular, infraclavicular, or axillary lymphadenopathy noted on palpation.   Cardiovascular: Normal rate and rhythm Respiratory: Clear to auscultation bilaterally. Chest expansion symmetric without accessory muscle use. Breathing non-labored.    GU: Deferred.   GI: Soft, non-tender abdomen. Normoactive bowel sounds.  Neuro: No focal deficits. Steady gait.   Psych: Normal mood and affect for situation. Extremities: No edema.  Skin: Warm and dry.    LABORATORY DATA:  CBC    Component Value Date/Time   WBC 6.0 10/28/2016 1225   RBC 4.18 10/28/2016 1225   HGB 13.3  10/28/2016 1225   HCT 39.7 10/28/2016 1225   PLT 72 (L) 10/28/2016 1225   MCV 95.0 10/28/2016 1225   MCH 31.8 10/28/2016 1225   MCHC 33.5 10/28/2016 1225   RDW 12.8 10/28/2016 1225   LYMPHSABS 2.6 10/28/2016 1225   MONOABS 0.3 10/28/2016 1225   EOSABS 0.1 10/28/2016 1225   BASOSABS 0.0 10/28/2016 1225   Results for MYEESHA, SHANE (MRN [id number])  Ref. Range 07/18/2011 05:00 07/19/2011 07:06 10/22/2014 13:26 06/10/2015 09:54 08/03/2016 15:19 08/12/2016 10:48 09/10/2016 10:36 09/17/2016 09:56  Platelets Latest Ref Range: 140 - 400 K/uL 252 224 185.0 97.0 (L) 66 (L) 65 (L) 53 (L) 87 (L)   Results for NATHALEE, SMARR (MRN [id number])  Ref. Range 10/28/2016 12:26  Total Protein ELP Latest Ref Range: 6.0 - 8.5 g/dL 5.7 (L)  Albumin ELP Latest Ref Range: 2.9 - 4.4 g/dL 3.4  Globulin, Total Latest Ref Range: 2.2 - 3.9 g/dL 2.3  A/G Ratio Latest Ref Range: 0.7 - 1.7  1.5  Alpha-1-Globulin Latest Ref Range: 0.0 - 0.4 g/dL 0.2  Alpha-2-Globulin Latest Ref Range: 0.4 - 1.0 g/dL 0.7  Beta Globulin Latest Ref Range: 0.7 - 1.3 g/dL 0.9  Gamma Globulin Latest Ref Range: 0.4 - 1.8 g/dL 0.6  M-SPIKE, % Latest Ref Range: Not Observed g/dL Not Observed  SPE Interp. Unknown Comment  Comment Unknown Comment   Results for WYATT, GALVAN (MRN [id number])   Ref. Range 10/28/2016  12:25  IgG (Immunoglobin G), Serum Latest Ref Range: 700 - 1,600 mg/dL 621 (L)  IgA Latest Ref Range: 64 - 422 mg/dL 161  IgM, Serum Latest Ref Range: 26 - 217 mg/dL 96   Results for JADAE, STEINKE (MRN [id number])   Ref. Range 10/28/2016 12:25  IgG (Immunoglobin G), Serum Latest Ref Range: 700 - 1,600 mg/dL 621 (L)  IgA Latest Ref Range: 64 - 422 mg/dL 161   Results for NEOMI, LAIDLER (MRN [id number])   Ref. Range 10/28/2016 12:25  Iron Latest Ref Range: 28 - 170 ug/dL 113  UIBC Latest Units: ug/dL 226  TIBC Latest Ref Range: 250 - 450 ug/dL 339  Saturation Ratios Latest Ref Range: 10.4 - 31.8 % 33 (H)  Ferritin Latest Ref Range: 11 - 307 ng/mL 23  Folate Latest Ref Range: >5.9 ng/mL 58.2  Copper Latest Ref Range: 72 - 166 ug/dL 102  Vitamin B12 Latest Ref Range: 180 - 914 pg/mL 664   Results for NAAMA, SAPPINGTON (MRN [id number])   Ref. Range 10/28/2016 12:24  HIV 1/2 Antibodies Latest Ref Range: NON REACTIVE  NON REACTIVE  Interpretation (HIV Ag Ab) Unknown A non reactive te...  HIV-1 P24 Antigen - HIV24 Latest Ref Range: NON REACTIVE  NON REACTIVE   Results for LAWAN, NANEZ (MRN [id number])   Ref. Range 10/28/2016 12:25  HCV Ab Latest Ref Range: 0.0 - 0.9 s/co ratio <0.1     DIAGNOSTIC IMAGING: U/S Abdomen: 11/03/16: CLINICAL DATA:  Thrombocytopenia.  Splenomegaly.  EXAM: ABDOMEN ULTRASOUND COMPLETE  COMPARISON:  CT 08/19/2009.  FINDINGS: Gallbladder: Cholecystectomy.  Common bile duct: Diameter: 3.4 mm  Liver: No focal lesion identified. Within normal limits in parenchymal echogenicity.  IVC: No abnormality visualized.  Pancreas: Visualized portion unremarkable.  Spleen: Size and appearance within normal limits.  Right Kidney: Length: 9.4 cm. Echogenicity within normal limits. No mass or hydronephrosis visualized.  Left Kidney: Length: 9.8 cm. Echogenicity within normal limits. No mass or hydronephrosis visualized.  Abdominal aorta:  No aneurysm visualized.  Other  findings: Aortic atherosclerotic vascular calcification.  IMPRESSION: 1. Cholecystectomy.  2.  Aortoiliac atherosclerotic vascular calcification.   Electronically Signed   By: Marcello Moores  Register   On: 11/03/2016 13:15      PATHOLOGY:  N/A  ASSESSMENT/PLAN:  Madeline [name] is a pleasant 83 y.o. female with recent h/o thrombocytopenia.  She presents to [facility] for continued follow-up and to establish plan of care.   Thrombocytopenia:  -I reviewed lab studies with patient today.  Ultrasound abdomen negative for liver disease. Hepatitic C and HIV tests negative. No M spike.  No iron deficiency anemia.  No identified medications that could be contributing to low platelet count.  After initial work-up, her thrombocytopenia is likely secondary to an autoimmune etiology. We discussed that we could not have definitive diagnosis with a bone marrow biopsy.  I discussed the bone marrow biopsy procedure with her.  Given her advanced age and the fact that she is largely asymptomatic, she prefers more conservative management at this time with continued observation, which is very reasonable.  She understands that we can discuss bone marrow biopsy again in the future, if warranted.  Return to cancer center in 6 weeks for labs only visit, then return in 3 months for follow-up visit with labs.   Tobacco use disorder:  -We discussed the pathophysiology of nicotine dependence and different strategies to stop smoking. The gold standard of tobacco cessation is nicotine replacement (with nicotine patches & gum/lozenges) or Varenicline (Chantix).  Encouraged her to set some new "rules" that do not allow her to smoke in areas where she has the biggest urge to smoke; instead she could use a piece of nicotine gum or a lozenge when she has a craving. I gave her instructions on how to use these nicotine replacement products.  She will require continued support for smoking  cessation.  MadelineGriffith  understands that data suggests that "cold [place]" is the least effective way to stop using tobacco products.  Having a clear "quit plan" is much more effective and requires a step-wise approach with continued support from a tobacco treatment specialist.  I encouraged her to reach out to me if she has further questions or interest in her continued cessation efforts.  Greater than 10 minutes was spent in smoking cessation counseling with this patient.        Dispo:  -Return for lab only visit (CBC with diff) in 6 weeks -Return to cancer center in 3 months with repeat CBC with diff for continued follow-up.    ORDERS PLACED FOR THIS ENCOUNTER: No orders of the defined types were placed in this encounter.   All questions were answered. The patient knows to call the clinic with any problems, questions or concerns. We can certainly see the patient much sooner if necessary.   Plan of care discussed with Dr. Ancil Linsey, who agrees with the above.   Mike Craze, NP [place] [phone]

## 2016-12-24 ENCOUNTER — Emergency Department
Admission: EM | Admit: 2016-12-24 | Discharge: 2016-12-24 | Disposition: A | Discharge status: Home / Self Care | Payer: PPO | Attending: Emergency Medicine | Admitting: Emergency Medicine

## 2016-12-24 DIAGNOSIS — F17210 Nicotine dependence, cigarettes, uncomplicated: Secondary | ICD-10-CM | POA: Insufficient documentation

## 2016-12-24 DIAGNOSIS — Z7982 Long term (current) use of aspirin: Secondary | ICD-10-CM | POA: Diagnosis not present

## 2016-12-24 DIAGNOSIS — T85694A Other mechanical complication of insulin pump, initial encounter: Secondary | ICD-10-CM | POA: Insufficient documentation

## 2016-12-24 DIAGNOSIS — E1165 Type 2 diabetes mellitus with hyperglycemia: Secondary | ICD-10-CM | POA: Diagnosis not present

## 2016-12-24 DIAGNOSIS — R739 Hyperglycemia, unspecified: Secondary | ICD-10-CM

## 2016-12-24 DIAGNOSIS — Z794 Long term (current) use of insulin: Secondary | ICD-10-CM | POA: Insufficient documentation

## 2016-12-24 DIAGNOSIS — Y828 Other medical devices associated with adverse incidents: Secondary | ICD-10-CM | POA: Diagnosis not present

## 2016-12-24 DIAGNOSIS — I2510 Atherosclerotic heart disease of native coronary artery without angina pectoris: Secondary | ICD-10-CM | POA: Insufficient documentation

## 2016-12-24 LAB — URINALYSIS, COMPLETE (UACMP) WITH MICROSCOPIC
Bacteria, UA: NONE SEEN
Bilirubin Urine: NEGATIVE
Glucose, UA: >=500 mg/dL — AB
Ketones, ur: 20 mg/dL — AB
Leukocytes, UA: NEGATIVE
Nitrite: NEGATIVE
Protein, ur: NEGATIVE mg/dL
Specific Gravity, Urine: 1.023 (ref 1.005–1.030)
pH: 5 (ref 5.0–8.0)

## 2016-12-24 LAB — GLUCOSE, CAPILLARY
Glucose-Capillary: 535 mg/dL (ref 65–99)
Glucose-Capillary: 404 mg/dL — ABNORMAL HIGH (ref 65–99)
Glucose-Capillary: 319 mg/dL — ABNORMAL HIGH (ref 65–99)

## 2016-12-24 LAB — BASIC METABOLIC PANEL
Anion gap: 10 (ref 5–15)
BUN: 23 mg/dL — ABNORMAL HIGH (ref 6–20)
CO2: 25 mmol/L (ref 22–32)
Calcium: 8.9 mg/dL (ref 8.9–10.3)
Chloride: 96 mmol/L — ABNORMAL LOW (ref 101–111)
Creatinine, Ser: 1.11 mg/dL — ABNORMAL HIGH (ref 0.44–1.00)
GFR calc Af Amer: 53 mL/min — ABNORMAL LOW (ref >60)
GFR calc non Af Amer: 46 mL/min — ABNORMAL LOW (ref >60)
Glucose, Bld: 545 mg/dL (ref 65–99)
Potassium: 4.9 mmol/L (ref 3.5–5.1)
Sodium: 131 mmol/L — ABNORMAL LOW (ref 135–145)

## 2016-12-24 LAB — HEPATIC FUNCTION PANEL
ALT: 19 U/L (ref 14–54)
AST: 33 U/L (ref 15–41)
Albumin: 3.8 g/dL (ref 3.5–5.0)
Alkaline Phosphatase: 77 U/L (ref 38–126)
Bilirubin, Direct: 0.3 mg/dL (ref 0.1–0.5)
Indirect Bilirubin: 0.9 mg/dL (ref 0.3–0.9)
Total Bilirubin: 1.2 mg/dL (ref 0.3–1.2)
Total Protein: 6.6 g/dL (ref 6.5–8.1)

## 2016-12-24 LAB — CBC
HCT: 37.9 % (ref 35.0–47.0)
Hemoglobin: 12.8 g/dL (ref 12.0–16.0)
MCH: 31.6 pg (ref 26.0–34.0)
MCHC: 33.7 g/dL (ref 32.0–36.0)
MCV: 93.7 fL (ref 80.0–100.0)
Platelets: 76 10*3/uL — ABNORMAL LOW (ref 150–440)
RBC: 4.05 MIL/uL (ref 3.80–5.20)
RDW: 12.6 % (ref 11.5–14.5)
WBC: 6.4 10*3/uL (ref 3.6–11.0)

## 2016-12-24 MED ORDER — SODIUM CHLORIDE 0.9 % IV BOLUS (SEPSIS)
1000.0000 mL | Freq: Once | INTRAVENOUS | Status: AC
  Administered 2016-12-24: 1000 mL via INTRAVENOUS

## 2016-12-24 MED ORDER — INSULIN ASPART 100 UNIT/ML ~~LOC~~ SOLN
10.0000 [IU] | Freq: Once | SUBCUTANEOUS | Status: AC
  Administered 2016-12-24: 10 [IU] via INTRAVENOUS
  Filled 2016-12-24: qty 10

## 2016-12-24 MED ORDER — SODIUM CHLORIDE 0.9 % IV BOLUS (SEPSIS)
1000.0000 mL | Freq: Once | INTRAVENOUS | Status: AC
Start: 2016-12-24 — End: 2016-12-24
  Administered 2016-12-24: 1000 mL via INTRAVENOUS

## 2016-12-24 NOTE — ED Triage Notes (Signed)
Pt reports CBG this morning above 600. Pt has insulin pump and took Novolog 20 units.

## 2016-12-24 NOTE — ED Notes (Signed)
Pt has insulin pump - blood sugar was over 600 - gave herself 20 units of novolog and states not coming down

## 2016-12-24 NOTE — ED Provider Notes (Signed)
[facility] Emergency Department Provider Note  ____________________________________________  Time seen: Approximately 1:06 PM  I have reviewed the triage vital signs and the nursing notes.   HISTORY  Chief Complaint Hyperglycemia    HPI Madeline [name] is a 83 y.o. female with a history of DM 2 on an insulin pump presenting for hyperglycemia. The patient reports that she change her pump site yesterday. Since then, she has had blood sugars between the 4 and 600s. Initially she had a mild headache, but this has completely resolved. At 11:45 AM, she give herself 20 units of NovoLog subcutaneously. She has not had any nausea or vomiting, abdominal pain, fever, chest pain or any other abnormal symptoms. She states that she thinks she has not received any insulin and that it appears that the insulin level has not gone down on her pump. However, I am concerned about how well she notes that he use her pump because she does not know how to turn it off.   Past Medical History:  Diagnosis Date  . Diabetes mellitus   . Dyslipidemia   . Hyperthyroidism     Patient Active Problem List   Diagnosis Date Noted  . Thrombocytopenia (HCC) 10/28/2016  . CAD (coronary artery disease) 08/14/2011  . Dyslipidemia 08/14/2011  . Tobacco abuse 08/14/2011    Past Surgical History:  Procedure Laterality Date  . BREAST BIOPSY Right 1967   EXCISIONAL - NEG  . no surgical hx      Current Outpatient Rx  . Order #: [id number] Class: Historical Med  . Order #: [id number] Class: Historical Med  . Order #: [id number] Class: Historical Med  . Order #: [id number] Class: Historical Med  . Order #: [id number] Class: Historical Med  . Order #: [id number] Class: Historical Med  . Order #: [id number] Class: Historical Med  . Order #: [id number] Class: Normal  . Order #: [id number] Class: Historical Med  . Order #: [id number] Class: Historical Med    Allergies Darvon; Propoxyphene; and Sulfa  antibiotics  Family History  Problem Relation Age of Onset  . Heart Problems Mother   . Other Father     UNK  . Cancer Sister     BREAST  . Breast cancer Sister 4560  . Cancer Sister     BREAST  . Breast cancer Sister 6740  . Cancer Sister     BREAST  . Diabetes Son     Social History Social History  Substance Use Topics  . Smoking status: Current Every Day Smoker    Packs/day: 1.00    Types: Cigarettes  . Smokeless tobacco: Never Used  . Alcohol use No    Review of Systems Constitutional: No fever/chills. Eyes: No visual changes. ENT: No sore throat. No congestion or rhinorrhea. Cardiovascular: Denies chest pain. Denies palpitations. Respiratory: Denies shortness of breath.  No cough. Gastrointestinal: No abdominal pain.  No nausea, no vomiting.  No diarrhea.  No constipation. Genitourinary: Negative for dysuria. Musculoskeletal: Negative for back pain. Skin: Negative for rash. Neurological: Mild headache, now resolved.. No focal numbness, tingling or weakness.  Endocrine: Per glycemia. Possible pump malfunction 10-point ROS otherwise negative.  ____________________________________________   PHYSICAL EXAM:  VITAL SIGNS: ED Triage Vitals [12/24/16 1202]  Enc Vitals Group     BP (!) 141/72     Pulse Rate 74     Resp 18     Temp 97.9 F (36.6 C)     Temp Source Oral     SpO2 96 %     Weight 113 lb (  51.3 kg)     Height 5\' 2"  (1.575 m)     Head Circumference      Peak Flow      Pain Score 5     Pain Loc      Pain Edu?      Excl. in GC?     Constitutional: Alert and oriented. Well appearing and in no acute distress. Answers questions appropriately. Eyes: Conjunctivae are normal.  EOMI. No scleral icterus. Head: Atraumatic. Nose: No congestion/rhinnorhea. Mouth/Throat: Mucous membranes are moist.  Neck: No stridor.  Supple.  No meningismus. Cardiovascular: Normal rate, regular rhythm. No murmurs, rubs or gallops.  Respiratory: Normal respiratory  effort.  No accessory muscle use or retractions. Lungs CTAB.  No wheezes, rales or ronchi. Gastrointestinal: Soft, nontender and nondistended.  No guarding or rebound.  No peritoneal signs.  Pump sticker is in place in the right lower abdomen, and pump is on. Musculoskeletal: No LE edema.  Neurologic:  A&Ox3.  Speech is clear.  Face and smile are symmetric.  EOMI.  Moves all extremities well. Skin:  Skin is warm, dry and intact. No rash noted. Psychiatric: Mood and affect are normal. Speech and behavior are normal.  Normal judgement.  ____________________________________________   LABS (all labs ordered are listed, but only abnormal results are displayed)  Labs Reviewed  BASIC METABOLIC PANEL - Abnormal; Notable for the following:       Result Value   Sodium 131 (*)    Chloride 96 (*)    Glucose, Bld 545 (*)    BUN 23 (*)    Creatinine, Ser 1.11 (*)    GFR calc non Af Amer 46 (*)    GFR calc Af Amer 53 (*)    All other components within normal limits  CBC - Abnormal; Notable for the following:    Platelets 76 (*)    All other components within normal limits  GLUCOSE, CAPILLARY - Abnormal; Notable for the following:    Glucose-Capillary 535 (*)    All other components within normal limits  GLUCOSE, CAPILLARY - Abnormal; Notable for the following:    Glucose-Capillary 404 (*)    All other components within normal limits  URINALYSIS, COMPLETE (UACMP) WITH MICROSCOPIC  HEPATIC FUNCTION PANEL  CBG MONITORING, ED   ____________________________________________  EKG  Not indicated ____________________________________________  RADIOLOGY  No results found.  ____________________________________________   PROCEDURES  Procedure(s) performed: None  Procedures  Critical Care performed: No ____________________________________________   INITIAL IMPRESSION / ASSESSMENT AND PLAN / ED COURSE  Pertinent labs & imaging results that were available during my care of the patient  were reviewed by me and considered in my medical decision making (see chart for details).  83 y.o. female with a history of diabetes on an insulin pump presenting for hyperglycemia that is otherwise asymptomatic. It appears that the most likely etiology is pump malfunction. I have made a phone call to the patient's endocrinology clinic in hopes to talk to her endocrinologist. In the meantime, we'll give her insulin here, as well as fluid as she has some mild renal insufficiency which may be from dehydration. At this time, there is no evidence of DKA, acute infection, or other cause for hyperglycemia.  ----------------------------------------- 1:26 PM on 12/24/2016 -----------------------------------------  I spoke with Dr. Tedd Sias, the patient's endocrinologist, who recommends changing the pump tubing and the site when the patient gets home. She will see the patient as an outpatient. I have explained return precautions as well as follow-up instructions  the patient. ____________________________________________  FINAL CLINICAL IMPRESSION(S) / ED DIAGNOSES  Final diagnoses:  Hyperglycemia  Insulin pump mechanical complication, initial encounter         NEW MEDICATIONS STARTED DURING THIS VISIT:  New Prescriptions   No medications on file      Rockne Menghini, MD 12/24/16 1326

## 2016-12-24 NOTE — ED Notes (Signed)
Report received - Pt's fluids need to finish before she can be discharged.

## 2016-12-24 NOTE — Discharge Instructions (Signed)
Please change your pump tubing and site placement when you get home.  Continue to monitor your blood sugars and eat as previously scheduled.  Return to the emergency department for vomiting, abdominal pain, changes in mental status, feeling shaky or sweaty, or any other symptoms concerning to you.

## 2016-12-30 DIAGNOSIS — E1065 Type 1 diabetes mellitus with hyperglycemia: Secondary | ICD-10-CM | POA: Diagnosis not present

## 2016-12-30 DIAGNOSIS — F17200 Nicotine dependence, unspecified, uncomplicated: Secondary | ICD-10-CM | POA: Diagnosis not present

## 2016-12-30 DIAGNOSIS — E1043 Type 1 diabetes mellitus with diabetic autonomic (poly)neuropathy: Secondary | ICD-10-CM | POA: Diagnosis not present

## 2016-12-30 DIAGNOSIS — Z9641 Presence of insulin pump (external) (internal): Secondary | ICD-10-CM | POA: Diagnosis not present

## 2017-01-04 ENCOUNTER — Other Ambulatory Visit: Payer: Self-pay | Admitting: Internal Medicine

## 2017-01-05 DIAGNOSIS — B351 Tinea unguium: Secondary | ICD-10-CM | POA: Diagnosis not present

## 2017-01-05 DIAGNOSIS — E1142 Type 2 diabetes mellitus with diabetic polyneuropathy: Secondary | ICD-10-CM | POA: Diagnosis not present

## 2017-01-05 DIAGNOSIS — Z794 Long term (current) use of insulin: Secondary | ICD-10-CM | POA: Diagnosis not present

## 2017-01-18 ENCOUNTER — Encounter (HOSPITAL_COMMUNITY): Payer: PPO | Attending: Oncology

## 2017-01-18 DIAGNOSIS — D696 Thrombocytopenia, unspecified: Secondary | ICD-10-CM | POA: Diagnosis not present

## 2017-01-18 LAB — CBC WITH DIFFERENTIAL/PLATELET
Basophils Absolute: 0.1 10*3/uL (ref 0.0–0.1)
Basophils Relative: 1 %
Eosinophils Absolute: 0.2 10*3/uL (ref 0.0–0.7)
Eosinophils Relative: 3 %
HCT: 39.4 % (ref 36.0–46.0)
Hemoglobin: 13 g/dL (ref 12.0–15.0)
Lymphocytes Relative: 38 %
Lymphs Abs: 2 10*3/uL (ref 0.7–4.0)
MCH: 30.9 pg (ref 26.0–34.0)
MCHC: 33 g/dL (ref 30.0–36.0)
MCV: 93.6 fL (ref 78.0–100.0)
Monocytes Absolute: 0.4 10*3/uL (ref 0.1–1.0)
Monocytes Relative: 7 %
Neutro Abs: 2.7 10*3/uL (ref 1.7–7.7)
Neutrophils Relative %: 51 %
Platelets: 77 10*3/uL — ABNORMAL LOW (ref 150–400)
RBC: 4.21 MIL/uL (ref 3.87–5.11)
RDW: 12.6 % (ref 11.5–15.5)
WBC: 5.3 10*3/uL (ref 4.0–10.5)

## 2017-01-25 DIAGNOSIS — Z1231 Encounter for screening mammogram for malignant neoplasm of breast: Secondary | ICD-10-CM | POA: Diagnosis not present

## 2017-01-25 DIAGNOSIS — J449 Chronic obstructive pulmonary disease, unspecified: Secondary | ICD-10-CM | POA: Diagnosis not present

## 2017-01-25 DIAGNOSIS — Z79899 Other long term (current) drug therapy: Secondary | ICD-10-CM | POA: Diagnosis not present

## 2017-01-25 DIAGNOSIS — Z0000 Encounter for general adult medical examination without abnormal findings: Secondary | ICD-10-CM | POA: Diagnosis not present

## 2017-01-25 DIAGNOSIS — F3341 Major depressive disorder, recurrent, in partial remission: Secondary | ICD-10-CM | POA: Diagnosis not present

## 2017-01-25 DIAGNOSIS — I10 Essential (primary) hypertension: Secondary | ICD-10-CM | POA: Diagnosis not present

## 2017-01-25 DIAGNOSIS — E7800 Pure hypercholesterolemia, unspecified: Secondary | ICD-10-CM | POA: Diagnosis not present

## 2017-01-25 DIAGNOSIS — E039 Hypothyroidism, unspecified: Secondary | ICD-10-CM | POA: Diagnosis not present

## 2017-02-16 DIAGNOSIS — E039 Hypothyroidism, unspecified: Secondary | ICD-10-CM | POA: Diagnosis not present

## 2017-02-16 DIAGNOSIS — E1065 Type 1 diabetes mellitus with hyperglycemia: Secondary | ICD-10-CM | POA: Diagnosis not present

## 2017-02-16 DIAGNOSIS — Z79899 Other long term (current) drug therapy: Secondary | ICD-10-CM | POA: Diagnosis not present

## 2017-02-16 DIAGNOSIS — I10 Essential (primary) hypertension: Secondary | ICD-10-CM | POA: Diagnosis not present

## 2017-02-23 DIAGNOSIS — E063 Autoimmune thyroiditis: Secondary | ICD-10-CM | POA: Diagnosis not present

## 2017-02-23 DIAGNOSIS — E1043 Type 1 diabetes mellitus with diabetic autonomic (poly)neuropathy: Secondary | ICD-10-CM | POA: Diagnosis not present

## 2017-02-23 DIAGNOSIS — Z9641 Presence of insulin pump (external) (internal): Secondary | ICD-10-CM | POA: Diagnosis not present

## 2017-02-23 DIAGNOSIS — E038 Other specified hypothyroidism: Secondary | ICD-10-CM | POA: Diagnosis not present

## 2017-02-23 DIAGNOSIS — E1065 Type 1 diabetes mellitus with hyperglycemia: Secondary | ICD-10-CM | POA: Diagnosis not present

## 2017-02-23 DIAGNOSIS — F17200 Nicotine dependence, unspecified, uncomplicated: Secondary | ICD-10-CM | POA: Diagnosis not present

## 2017-03-08 ENCOUNTER — Encounter (HOSPITAL_COMMUNITY): Payer: PPO | Attending: Oncology | Admitting: Oncology

## 2017-03-08 ENCOUNTER — Encounter (HOSPITAL_COMMUNITY): Payer: Self-pay

## 2017-03-08 VITALS — BP 150/59 | HR 62 | Temp 98.0°F | Resp 18 | Wt 112.5 lb

## 2017-03-08 DIAGNOSIS — D696 Thrombocytopenia, unspecified: Secondary | ICD-10-CM | POA: Insufficient documentation

## 2017-03-08 DIAGNOSIS — Z794 Long term (current) use of insulin: Secondary | ICD-10-CM

## 2017-03-08 DIAGNOSIS — E119 Type 2 diabetes mellitus without complications: Secondary | ICD-10-CM

## 2017-03-08 DIAGNOSIS — I2510 Atherosclerotic heart disease of native coronary artery without angina pectoris: Secondary | ICD-10-CM | POA: Diagnosis not present

## 2017-03-08 NOTE — Patient Instructions (Addendum)
[facility] Discharge Instructions  RECOMMENDATIONS MADE BY THE CONSULTANT AND ANY TEST RESULTS WILL BE SENT TO YOUR REFERRING PHYSICIAN.  You were seen today by Dr. Louise Zhou Follow up in 6 months with lab work   Thank you for choosing [facility] to provide your oncology and hematology care.  To afford each patient quality time with our provider, please arrive at least 15 minutes before your scheduled appointment time.    If you have a lab appointment with the Cancer Center please come in thru the  Main Entrance and check in at the main information desk  You need to re-schedule your appointment should you arrive 10 or more minutes late.  We strive to give you quality time with our providers, and arriving late affects you and other patients whose appointments are after yours.  Also, if you no show three or more times for appointments you may be dismissed from the clinic at the providers discretion.     Again, thank you for choosing [facility].  Our hope is that these requests will decrease the amount of time that you wait before being seen by our physicians.       _____________________________________________________________  Should you have questions after your visit to [facility], please contact our office at [phone] between the hours of 8:30 a.m. and 4:30 p.m.  Voicemails left after 4:30 p.m. will not be returned until the following business day.  For prescription refill requests, have your pharmacy contact our office.       Resources For Cancer Patients and their Caregivers ? American Cancer Society: Can assist with transportation, wigs, general needs, runs Look Good Feel Better.        1-888-227-6333 ? Cancer Care: Provides financial assistance, online support groups, medication/co-pay assistance.  1-800-813-HOPE (4673) ? [facility] Assists  [place] cancer patients and their families through emotional , educational and financial support.  336-427-4357 ? [place] DSS Where to apply for food stamps, Medicaid and utility assistance. 336-342-1394 ? [facility]: Transportation to medical appointments. 336-347-2287 ? Social Security Administration: May apply for disability if have a Stage IV cancer. 336-342-7796 1-800-772-1213 ? [place] Aging, Disability and Transit Services: Assists with nutrition, care and transit needs. 336-349-2343  Cancer Center Support Programs: @10RELATIVEDAYS@ > Cancer Support Group  2nd Tuesday of the month 1pm-2pm, Journey Room  > Creative Journey  3rd Tuesday of the month 1130am-1pm, Journey Room  > Look Good Feel Better  1st Wednesday of the month 10am-12 noon, Journey Room (Call American Cancer Society to register 1-800-395-5775)    

## 2017-03-08 NOTE — Progress Notes (Signed)
[facility] Hematology/Oncology Progress Note  Name: Madeline [name]      MRN: [id number]    Date: 03/08/2017 Time:11:30 AM   REFERRING PHYSICIAN:  Dorris Carnes, MD (Cardiologist)  REASON FOR CONSULT:  Thrombocytopenia   DIAGNOSIS:  Thrombocytopenia, sudden/acute onset in July 2016.  Relatively stable since.  HISTORY OF PRESENT ILLNESS:   Madeline [name] is a 83 y.o. female with a medical history significant for CAD, dyslipidemia, tobacco abuse, depression, DM, insulin dependent, and hypothyroidism who returns to the [facility] for follow up of thrombocytopenia.  She is doing well today. She bruises very easily but has not had any bleeding issues. She had one episode of dizziness a few weeks ago, causing her to fall. This hasn't happened since. Denies blood in stool, hematuria, chest pain, SOB, abdominal pain, loss of appetite, or any other concerns.   Review of Systems  Constitutional: Negative.   HENT: Negative.   Eyes: Negative.   Respiratory: Negative.  Negative for shortness of breath.   Cardiovascular: Negative.  Negative for chest pain.  Gastrointestinal: Negative.  Negative for abdominal pain and blood in stool.  Genitourinary: Negative.  Negative for hematuria.  Musculoskeletal: Positive for falls.  Skin: Negative.   Neurological: Positive for dizziness.  Endo/Heme/Allergies: Bruises/bleeds easily.  Psychiatric/Behavioral: Negative.   All other systems reviewed and are negative. 14 point review of systems was performed and is negative except as detailed under history of present illness and above  PAST MEDICAL HISTORY:   Past Medical History:  Diagnosis Date  . Diabetes mellitus   . Dyslipidemia   . Hyperthyroidism     ALLERGIES: Allergies  Allergen Reactions  . Darvon     "FEEL SICK"  . Propoxyphene Nausea Only and Nausea And Vomiting  . Sulfa Antibiotics     "FEEL SICK"       MEDICATIONS: I have reviewed the patient's  current medications.    Current Outpatient Prescriptions on File Prior to Visit  Medication Sig Dispense Refill  . aspirin 81 MG tablet Take 81 mg by mouth daily.      Marland Kitchen atorvastatin (LIPITOR) 20 MG tablet Take 20 mg by mouth daily at 6 PM.    . citalopram (CELEXA) 20 MG tablet Take 20 mg by mouth daily.      . insulin glargine (LANTUS) 100 UNIT/ML injection Inject 15 Units into the skin at bedtime.     . insulin lispro (HUMALOG) 100 UNIT/ML injection Inject into the skin 3 (three) times daily before meals. Patient uses insulin pump    . lisinopril (PRINIVIL,ZESTRIL) 10 MG tablet Take 10 mg by mouth daily.     . metoprolol tartrate (LOPRESSOR) 25 MG tablet TAKE ONE TABLET BY MOUTH TWICE DAILY 180 tablet 3  . Multiple Vitamin (MULTIVITAMIN WITH MINERALS) TABS tablet Take 1 tablet by mouth daily.    . nitroGLYCERIN (NITROSTAT) 0.4 MG SL tablet Place 0.4 mg under the tongue every 5 (five) minutes as needed.       No current facility-administered medications on file prior to visit.      PAST SURGICAL HISTORY Past Surgical History:  Procedure Laterality Date  . BREAST BIOPSY Right 1967   EXCISIONAL - NEG  . no surgical hx      FAMILY HISTORY: Family History  Problem Relation Age of Onset  . Heart Problems Mother   . Other Father     UNK  . Cancer Sister  BREAST  . Breast cancer Sister 78  . Cancer Sister     BREAST  . Breast cancer Sister 79  . Cancer Sister     BREAST  . Diabetes Son    Mother deceased at age 8 from complications of heart disease. Father passed at the age of 62's from stroke. She has 4 children. Son 67 yo with DM Son 55 yo healthy Daughter 36 yo with pacemaker Daughter 68 yo with headaches 8 siblings in total, 4 deceased from [place], unknown cancer, and heart disease (x2).  SOCIAL HISTORY: She admits to a 55 pack year smoking history, 1 ppd x 55+ years.  She denies any EtOH or illicit drug use.  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  She is married x 63 years.  Social History   Social History  . Marital status: Married    Spouse name: N/A  . Number of children: N/A  . Years of education: N/A   Social History Main Topics  . Smoking status: Current Every Day Smoker    Packs/day: 1.00    Types: Cigarettes  . Smokeless tobacco: Never Used  . Alcohol use No  . Drug use: No  . Sexual activity: Not Asked   Other Topics Concern  . None   Social History Narrative  . None    PERFORMANCE STATUS: The patient's performance status is 1 - Symptomatic but completely ambulatory  PHYSICAL EXAM: Most Recent Vital Signs: Blood pressure (!) 150/59, pulse 62, temperature 98 F (36.7 C), temperature source Oral, resp. rate 18, weight 112 lb 8 oz (51 kg), SpO2 100 %. Physical Exam  Constitutional: She is oriented to person, place, and time and well-developed, well-nourished, and in no distress.  HENT:  Head: Normocephalic and atraumatic.  Eyes: EOM are normal. Pupils are equal, round, and reactive to light.  Neck: Normal range of motion. Neck supple.  Cardiovascular: Normal rate, regular rhythm and normal heart sounds.   Pulmonary/Chest: Effort normal and breath sounds normal.  Abdominal: Soft. Bowel sounds are normal.  Insulin pump in LLQ  Musculoskeletal: Normal range of motion.  Neurological: She is alert and oriented to person, place, and time. Gait normal.  Skin: Skin is warm and dry.  Nursing note and vitals reviewed.  LABORATORY DATA:  CBC    Component Value Date/Time   WBC 5.3 01/18/2017 0819   RBC 4.21 01/18/2017 0819   HGB 13.0 01/18/2017 0819   HCT 39.4 01/18/2017 0819   PLT 77 (L) 01/18/2017 0819   MCV 93.6 01/18/2017 0819   MCH 30.9 01/18/2017 0819   MCHC 33.0 01/18/2017 0819   RDW 12.6 01/18/2017 0819   LYMPHSABS 2.0 01/18/2017 0819   MONOABS 0.4 01/18/2017 0819   EOSABS 0.2 01/18/2017 0819   BASOSABS 0.1 01/18/2017 0819   CMP Latest Ref Rng & Units 12/24/2016 10/28/2016 09/10/2016    Glucose 65 - 99 mg/dL 545(HH) 97 686(HH)  BUN 6 - 20 mg/dL 23(H) 10 19  Creatinine 0.44 - 1.00 mg/dL 1.11(H) 0.71 0.98  Sodium 135 - 145 mmol/L 131(L) 138 134(L)  Potassium 3.5 - 5.1 mmol/L 4.9 5.0 5.5(H)  Chloride 101 - 111 mmol/L 96(L) 103 96(L)  CO2 22 - 32 mmol/L 25 30 26   Calcium 8.9 - 10.3 mg/dL 8.9 9.2 9.1  Total Protein 6.5 - 8.1 g/dL 6.6 6.1(L) 6.4(L)  Total Bilirubin 0.3 - 1.2 mg/dL 1.2 0.5 1.3(H)  Alkaline Phos 38 - 126 U/L 77 68 73  AST 15 - 41 U/L 33 24 42(H)  ALT 14 - 54 U/L 19 22 34   ASSESSMENT/PLAN:  Moderate Thrombocytopenia  Tobacco Abuse  Thrombocytopenia dating back to July 2016 with an acute change in platelet count, stable since then.  Suspect underlying ITP.  I again recommended a bone marrow biopsy. She declined.   Labs reviewed. Check platelets at next visit.   She will return for follow up in 6 months with CBC.   All questions were answered. The patient knows to call the clinic with any problems, questions or concerns. We can certainly see the patient much sooner if necessary.  This document serves as a record of services personally performed by Twana First, MD. It was created on her behalf by Martinique Casey, a trained medical scribe. The creation of this record is based on the scribe's personal observations and the provider's statements to them. This document has been checked and approved by the attending provider.  I have reviewed the above documentation for accuracy and completeness and I agree with the above.  This note is electronically signed [id number] Sanda Klein  03/08/2017 11:30 AM

## 2017-05-18 DIAGNOSIS — E1065 Type 1 diabetes mellitus with hyperglycemia: Secondary | ICD-10-CM | POA: Diagnosis not present

## 2017-05-27 DIAGNOSIS — E039 Hypothyroidism, unspecified: Secondary | ICD-10-CM | POA: Diagnosis not present

## 2017-05-27 DIAGNOSIS — Z79899 Other long term (current) drug therapy: Secondary | ICD-10-CM | POA: Diagnosis not present

## 2017-05-27 DIAGNOSIS — E1042 Type 1 diabetes mellitus with diabetic polyneuropathy: Secondary | ICD-10-CM | POA: Diagnosis not present

## 2017-05-27 DIAGNOSIS — J449 Chronic obstructive pulmonary disease, unspecified: Secondary | ICD-10-CM | POA: Diagnosis not present

## 2017-05-27 DIAGNOSIS — E7800 Pure hypercholesterolemia, unspecified: Secondary | ICD-10-CM | POA: Diagnosis not present

## 2017-05-27 DIAGNOSIS — I10 Essential (primary) hypertension: Secondary | ICD-10-CM | POA: Diagnosis not present

## 2017-07-06 DIAGNOSIS — Z794 Long term (current) use of insulin: Secondary | ICD-10-CM | POA: Diagnosis not present

## 2017-07-06 DIAGNOSIS — B351 Tinea unguium: Secondary | ICD-10-CM | POA: Diagnosis not present

## 2017-07-06 DIAGNOSIS — E1142 Type 2 diabetes mellitus with diabetic polyneuropathy: Secondary | ICD-10-CM | POA: Diagnosis not present

## 2017-08-09 ENCOUNTER — Other Ambulatory Visit: Payer: Self-pay | Admitting: Internal Medicine

## 2017-08-09 DIAGNOSIS — E113393 Type 2 diabetes mellitus with moderate nonproliferative diabetic retinopathy without macular edema, bilateral: Secondary | ICD-10-CM | POA: Diagnosis not present

## 2017-08-09 DIAGNOSIS — Z961 Presence of intraocular lens: Secondary | ICD-10-CM | POA: Diagnosis not present

## 2017-08-09 DIAGNOSIS — H04123 Dry eye syndrome of bilateral lacrimal glands: Secondary | ICD-10-CM | POA: Diagnosis not present

## 2017-08-09 DIAGNOSIS — E089 Diabetes mellitus due to underlying condition without complications: Secondary | ICD-10-CM | POA: Diagnosis not present

## 2017-08-13 ENCOUNTER — Encounter: Payer: Self-pay | Admitting: Internal Medicine

## 2017-08-13 ENCOUNTER — Ambulatory Visit (INDEPENDENT_AMBULATORY_CARE_PROVIDER_SITE_OTHER): Payer: PPO | Admitting: Internal Medicine

## 2017-08-13 VITALS — BP 134/62 | HR 43 | Ht 62.0 in | Wt 108.5 lb

## 2017-08-13 DIAGNOSIS — Z720 Tobacco use: Secondary | ICD-10-CM

## 2017-08-13 DIAGNOSIS — I2510 Atherosclerotic heart disease of native coronary artery without angina pectoris: Secondary | ICD-10-CM

## 2017-08-13 DIAGNOSIS — E785 Hyperlipidemia, unspecified: Secondary | ICD-10-CM

## 2017-08-13 NOTE — Patient Instructions (Signed)
Your physician recommends that you continue on your current medications as directed. Please refer to the Current Medication list given to you today.  Your physician recommends that you return for lab work today (BMET, CBC, LIPIDS)  Your physician wants you to follow-up in: 1 YEAR WITH DR. ROSS.  You will receive a reminder letter in the mail two months in advance. If you don't receive a letter, please call our office to schedule the follow-up appointment.  

## 2017-08-13 NOTE — Progress Notes (Signed)
Cardiology Office Note   Date:  08/13/2017   ID:  Madeline [name], DOB Apr 11, 1934, MRN [id number]  PCP:  Marguarite Arbour, MD  Cardiologist:   Dietrich Pates, MD   F/U of CAD   History of Present Illness: Madeline [name] is a 83 y.o. female with a history of CAD, s/p inferior STEMI om 2012  She had occlusion of the RCA and underwent PTCA with DES to theprox RCA. It was a right dominant system with dual PDAs. Other vessels had irregularities. She was d/cd home on [place].  SInce I saw her in Feb she has done OK  No CP  Breathing is stable  Continues to smoke    No SOB  No CP  Outpatient Medications Prior to Visit  Medication Sig Dispense Refill  . aspirin 81 MG tablet Take 81 mg by mouth daily.      Marland Kitchen atorvastatin (LIPITOR) 20 MG tablet TAKE ONE TABLET BY MOUTH ONCE DAILY 90 tablet 0  . cetirizine (ZYRTEC) 10 MG tablet Take by mouth.    . citalopram (CELEXA) 20 MG tablet Take 20 mg by mouth daily.      . insulin glargine (LANTUS) 100 UNIT/ML injection Inject 15 Units into the skin at bedtime.     . insulin lispro (HUMALOG) 100 UNIT/ML injection Inject into the skin 3 (three) times daily before meals. Patient uses insulin pump    . lisinopril (PRINIVIL,ZESTRIL) 10 MG tablet Take 10 mg by mouth daily.     . metoprolol tartrate (LOPRESSOR) 25 MG tablet TAKE ONE TABLET BY MOUTH TWICE DAILY 180 tablet 3  . Multiple Vitamin (MULTIVITAMIN WITH MINERALS) TABS tablet Take 1 tablet by mouth daily.    . nitroGLYCERIN (NITROSTAT) 0.4 MG SL tablet Place 0.4 mg under the tongue every 5 (five) minutes as needed.      Marland Kitchen levothyroxine (SYNTHROID, LEVOTHROID) 50 MCG tablet Take by mouth.     No facility-administered medications prior to visit.      Allergies:   Darvon; Propoxyphene; and Sulfa antibiotics   Past Medical History:  Diagnosis Date  . Diabetes mellitus   . Dyslipidemia   . Hyperthyroidism     Past Surgical History:  Procedure Laterality Date  . BREAST BIOPSY Right 1967   EXCISIONAL - NEG  . no surgical hx       Social History:  The patient  reports that she has been smoking Cigarettes.  She has been smoking about 1.00 pack per day. She has never used smokeless tobacco. She reports that she does not drink alcohol or use drugs.   Family History:  The patient's family history includes Breast cancer (age of onset: 43) in her sister; Breast cancer (age of onset: 17) in her sister; Cancer in her sister, sister, and sister; Diabetes in her son; Heart Problems in her mother; Other in her father.    ROS:  Please see the history of present illness. All other systems are reviewed and  Negative to the above problem except as noted.    PHYSICAL EXAM: VS:  BP 134/62   Pulse (!) 43   Ht  (1.575 m)   Wt 108 lb 8 oz (49.2 kg)   SpO2 98%   BMI 19.84 kg/m   GEN: Well nourished, well developed, in no acute distress  HEENT: normal  Neck: JVP normal   Nocarotid bruits, or masses Cardiac: RRR; no murmurs, rubs, or gallops,no edema  Respiratory:  clear to auscultation bilaterally, normal work  of breathing GI: soft, nontender, nondistended, + BS  No hepatomegaly  MS: no deformity Moving all extremities   Skin: warm and dry, no rash Neuro:  Strength and sensation are intact Psych: euthymic mood, full affect   EKG:  EKG not ordered today      Lipid Panel    Component Value Date/Time   CHOL 112 (L) 08/03/2016 1519   TRIG 94 08/03/2016 1519   HDL 67 08/03/2016 1519   CHOLHDL 1.7 08/03/2016 1519   VLDL 19 08/03/2016 1519   LDLCALC 26 08/03/2016 1519      Wt Readings from Last 3 Encounters:  08/13/17 108 lb 8 oz (49.2 kg)  03/08/17 112 lb 8 oz (51 kg)  12/24/16 113 lb (51.3 kg)      ASSESSMENT AND PLAN:  1  CAD  Pt remains asymptomatic  No new recommendations    2.  Tob  Continues to smoke  COunselled on cessation  3  HL  Keep on meds     F/U in 1 year  Sooner for sympotms     Current medicines are reviewed at length with the patient today.   The patient does not have concerns regarding medicines. Signed, Dietrich Pates, MD  08/13/2017 12:03 PM    [facility] [address] [place], [place], [place]  [address] Phone: [phone]; Fax: [phone]

## 2017-08-14 LAB — CBC
Hematocrit: 34.9 % (ref 34.0–46.6)
Hemoglobin: 11.5 g/dL (ref 11.1–15.9)
MCH: 29.8 pg (ref 26.6–33.0)
MCHC: 33 g/dL (ref 31.5–35.7)
MCV: 90 fL (ref 79–97)
Platelets: 41 10*3/uL — CL (ref 150–379)
RBC: 3.86 x10E6/uL (ref 3.77–5.28)
RDW: 13.1 % (ref 12.3–15.4)
WBC: 6.2 10*3/uL (ref 3.4–10.8)

## 2017-08-14 LAB — BASIC METABOLIC PANEL
BUN/Creatinine Ratio: 13 (ref 12–28)
BUN: 10 mg/dL (ref 8–27)
CO2: 26 mmol/L (ref 20–29)
Calcium: 8.8 mg/dL (ref 8.7–10.3)
Chloride: 101 mmol/L (ref 96–106)
Creatinine, Ser: 0.79 mg/dL (ref 0.57–1.00)
GFR calc Af Amer: 81 mL/min/{1.73_m2} (ref >59)
GFR calc non Af Amer: 70 mL/min/{1.73_m2} (ref >59)
Glucose: 150 mg/dL — ABNORMAL HIGH (ref 65–99)
Potassium: 4.5 mmol/L (ref 3.5–5.2)
Sodium: 141 mmol/L (ref 134–144)

## 2017-08-14 LAB — LIPID PANEL
Chol/HDL Ratio: 1.8 ratio (ref 0.0–4.4)
Cholesterol, Total: 105 mg/dL (ref 100–199)
HDL: 58 mg/dL (ref >39)
LDL Calculated: 36 mg/dL (ref 0–99)
Triglycerides: 53 mg/dL (ref 0–149)
VLDL Cholesterol Cal: 11 mg/dL (ref 5–40)

## 2017-08-16 ENCOUNTER — Other Ambulatory Visit: Payer: Self-pay | Admitting: *Deleted

## 2017-08-18 DIAGNOSIS — E1065 Type 1 diabetes mellitus with hyperglycemia: Secondary | ICD-10-CM | POA: Diagnosis not present

## 2017-08-26 DIAGNOSIS — Z23 Encounter for immunization: Secondary | ICD-10-CM | POA: Diagnosis not present

## 2017-08-26 DIAGNOSIS — D696 Thrombocytopenia, unspecified: Secondary | ICD-10-CM | POA: Diagnosis not present

## 2017-08-26 DIAGNOSIS — J449 Chronic obstructive pulmonary disease, unspecified: Secondary | ICD-10-CM | POA: Diagnosis not present

## 2017-08-26 DIAGNOSIS — E1042 Type 1 diabetes mellitus with diabetic polyneuropathy: Secondary | ICD-10-CM | POA: Diagnosis not present

## 2017-08-26 DIAGNOSIS — Z79899 Other long term (current) drug therapy: Secondary | ICD-10-CM | POA: Diagnosis not present

## 2017-08-26 DIAGNOSIS — E7800 Pure hypercholesterolemia, unspecified: Secondary | ICD-10-CM | POA: Diagnosis not present

## 2017-08-26 DIAGNOSIS — I10 Essential (primary) hypertension: Secondary | ICD-10-CM | POA: Diagnosis not present

## 2017-08-26 DIAGNOSIS — E039 Hypothyroidism, unspecified: Secondary | ICD-10-CM | POA: Diagnosis not present

## 2017-08-30 ENCOUNTER — Other Ambulatory Visit: Payer: Self-pay | Admitting: Internal Medicine

## 2017-08-30 DIAGNOSIS — Z1231 Encounter for screening mammogram for malignant neoplasm of breast: Secondary | ICD-10-CM

## 2017-09-07 ENCOUNTER — Encounter (HOSPITAL_COMMUNITY): Payer: PPO | Attending: Oncology | Admitting: Oncology

## 2017-09-07 ENCOUNTER — Encounter (HOSPITAL_COMMUNITY): Payer: Self-pay | Admitting: Oncology

## 2017-09-07 ENCOUNTER — Encounter (HOSPITAL_COMMUNITY): Payer: PPO

## 2017-09-07 VITALS — BP 138/57 | HR 65 | Resp 16 | Ht 62.0 in | Wt 108.5 lb

## 2017-09-07 DIAGNOSIS — I2510 Atherosclerotic heart disease of native coronary artery without angina pectoris: Secondary | ICD-10-CM | POA: Diagnosis not present

## 2017-09-07 DIAGNOSIS — F17210 Nicotine dependence, cigarettes, uncomplicated: Secondary | ICD-10-CM | POA: Insufficient documentation

## 2017-09-07 DIAGNOSIS — Z833 Family history of diabetes mellitus: Secondary | ICD-10-CM | POA: Diagnosis not present

## 2017-09-07 DIAGNOSIS — E119 Type 2 diabetes mellitus without complications: Secondary | ICD-10-CM | POA: Insufficient documentation

## 2017-09-07 DIAGNOSIS — E039 Hypothyroidism, unspecified: Secondary | ICD-10-CM | POA: Diagnosis not present

## 2017-09-07 DIAGNOSIS — D696 Thrombocytopenia, unspecified: Secondary | ICD-10-CM | POA: Diagnosis not present

## 2017-09-07 DIAGNOSIS — E785 Hyperlipidemia, unspecified: Secondary | ICD-10-CM | POA: Diagnosis not present

## 2017-09-07 DIAGNOSIS — Z8249 Family history of ischemic heart disease and other diseases of the circulatory system: Secondary | ICD-10-CM | POA: Insufficient documentation

## 2017-09-07 DIAGNOSIS — F329 Major depressive disorder, single episode, unspecified: Secondary | ICD-10-CM | POA: Diagnosis not present

## 2017-09-07 DIAGNOSIS — M545 Low back pain: Secondary | ICD-10-CM | POA: Diagnosis not present

## 2017-09-07 DIAGNOSIS — Z794 Long term (current) use of insulin: Secondary | ICD-10-CM | POA: Diagnosis not present

## 2017-09-07 DIAGNOSIS — Z803 Family history of malignant neoplasm of breast: Secondary | ICD-10-CM | POA: Diagnosis not present

## 2017-09-07 DIAGNOSIS — Z79899 Other long term (current) drug therapy: Secondary | ICD-10-CM | POA: Diagnosis not present

## 2017-09-07 DIAGNOSIS — Z882 Allergy status to sulfonamides status: Secondary | ICD-10-CM | POA: Diagnosis not present

## 2017-09-07 DIAGNOSIS — Z888 Allergy status to other drugs, medicaments and biological substances status: Secondary | ICD-10-CM | POA: Insufficient documentation

## 2017-09-07 LAB — CBC WITH DIFFERENTIAL/PLATELET
Basophils Absolute: 0 10*3/uL (ref 0.0–0.1)
Basophils Relative: 0 %
Eosinophils Absolute: 0.1 10*3/uL (ref 0.0–0.7)
Eosinophils Relative: 2 %
HCT: 39.3 % (ref 36.0–46.0)
Hemoglobin: 13 g/dL (ref 12.0–15.0)
Lymphocytes Relative: 27 %
Lymphs Abs: 1.9 10*3/uL (ref 0.7–4.0)
MCH: 30.8 pg (ref 26.0–34.0)
MCHC: 33.1 g/dL (ref 30.0–36.0)
MCV: 93.1 fL (ref 78.0–100.0)
Monocytes Absolute: 0.5 10*3/uL (ref 0.1–1.0)
Monocytes Relative: 7 %
Neutro Abs: 4.4 10*3/uL (ref 1.7–7.7)
Neutrophils Relative %: 63 %
Platelets: 53 10*3/uL — ABNORMAL LOW (ref 150–400)
RBC: 4.22 MIL/uL (ref 3.87–5.11)
RDW: 12.9 % (ref 11.5–15.5)
WBC: 7 10*3/uL (ref 4.0–10.5)

## 2017-09-07 NOTE — Progress Notes (Signed)
[facility] Hematology/Oncology Progress Note  Name: Madeline [name]      MRN: [id number]    Date: 09/07/2017 Time:11:31 AM   REFERRING PHYSICIAN:  Dorris Carnes, MD (Cardiologist)  REASON FOR CONSULT:  Thrombocytopenia   DIAGNOSIS:  Thrombocytopenia, sudden/acute onset in July 2016.  Relatively stable since.  HISTORY OF PRESENT ILLNESS:   Madeline [name] is a 83 y.o. female with a medical history significant for CAD, dyslipidemia, tobacco abuse, depression, DM, insulin dependent, and hypothyroidism who returns to the [facility] for follow up of thrombocytopenia.  She is doing well today. She states that she has been having low back pain for the past few months. Otherwise she has no complaints. She denies blood in stool, hematuria, chest pain, SOB, abdominal pain, loss of appetite, or any other concerns.   Review of Systems  Constitutional: Negative.   HENT: Negative.   Eyes: Negative.   Respiratory: Negative.  Negative for shortness of breath.   Cardiovascular: Negative.  Negative for chest pain.  Gastrointestinal: Negative.  Negative for abdominal pain and blood in stool.  Genitourinary: Negative.  Negative for hematuria.  Musculoskeletal: Positive for back pain. Negative for falls.  Skin: Negative.   Neurological: Negative for dizziness.  Endo/Heme/Allergies: Does not bruise/bleed easily.  Psychiatric/Behavioral: Negative.   All other systems reviewed and are negative. 14 point review of systems was performed and is negative except as detailed under history of present illness and above  PAST MEDICAL HISTORY:   Past Medical History:  Diagnosis Date  . Diabetes mellitus   . Dyslipidemia   . Hyperthyroidism     ALLERGIES: Allergies  Allergen Reactions  . Darvon     "FEEL SICK"  . Propoxyphene Nausea Only and Nausea And Vomiting  . Sulfa Antibiotics Nausea Only and Nausea And Vomiting    "FEEL SICK"       MEDICATIONS: I have reviewed  the patient's current medications.    Current Outpatient Prescriptions on File Prior to Visit  Medication Sig Dispense Refill  . atorvastatin (LIPITOR) 20 MG tablet TAKE ONE TABLET BY MOUTH ONCE DAILY 90 tablet 0  . cetirizine (ZYRTEC) 10 MG tablet Take by mouth.    . citalopram (CELEXA) 20 MG tablet Take 20 mg by mouth daily.      . insulin glargine (LANTUS) 100 UNIT/ML injection Inject 15 Units into the skin at bedtime.     . insulin lispro (HUMALOG) 100 UNIT/ML injection Inject into the skin 3 (three) times daily before meals. Patient uses insulin pump    . levothyroxine (SYNTHROID, LEVOTHROID) 75 MCG tablet Take 75 mcg by mouth daily.     Marland Kitchen lisinopril (PRINIVIL,ZESTRIL) 10 MG tablet Take 10 mg by mouth daily.     . metoprolol tartrate (LOPRESSOR) 25 MG tablet TAKE ONE TABLET BY MOUTH TWICE DAILY 180 tablet 3  . Multiple Vitamin (MULTIVITAMIN WITH MINERALS) TABS tablet Take 1 tablet by mouth daily.    . nitroGLYCERIN (NITROSTAT) 0.4 MG SL tablet Place 0.4 mg under the tongue every 5 (five) minutes as needed.       No current facility-administered medications on file prior to visit.      PAST SURGICAL HISTORY Past Surgical History:  Procedure Laterality Date  . BREAST BIOPSY Right 1967   EXCISIONAL - NEG  . no surgical hx      FAMILY HISTORY: Family History  Problem Relation Age of Onset  . Heart Problems Mother   .  Other Father        UNK  . Cancer Sister        BREAST  . Breast cancer Sister 20  . Cancer Sister        BREAST  . Breast cancer Sister 64  . Cancer Sister        BREAST  . Diabetes Son    Mother deceased at age 61 from complications of heart disease. Father passed at the age of 20's from stroke. She has 4 children. Son 66 yo with DM Son 2 yo healthy Daughter 31 yo with pacemaker Daughter 64 yo with headaches 8 siblings in total, 4 deceased from [place], unknown cancer, and heart disease (x2).  SOCIAL HISTORY: She admits to a 55 pack year smoking  history, 1 ppd x 55+ years.  She denies any EtOH or illicit drug use.  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  She is married x 63 years.  Social History   Social History  . Marital status: Married    Spouse name: N/A  . Number of children: N/A  . Years of education: N/A   Social History Main Topics  . Smoking status: Current Every Day Smoker    Packs/day: 1.00    Types: Cigarettes  . Smokeless tobacco: Never Used  . Alcohol use No  . Drug use: No  . Sexual activity: Not Asked   Other Topics Concern  . None   Social History Narrative  . None    PERFORMANCE STATUS: The patient's performance status is 1 - Symptomatic but completely ambulatory  PHYSICAL EXAM: Most Recent Vital Signs: Blood pressure (!) 138/57, pulse 65, resp. rate 16, height 5' 2"  (1.575 m), weight 108 lb 8 oz (49.2 kg), SpO2 99 %. Physical Exam  Constitutional: She is oriented to person, place, and time and well-developed, well-nourished, and in no distress.  HENT:  Head: Normocephalic and atraumatic.  Eyes: Pupils are equal, round, and reactive to light. EOM are normal.  Neck: Normal range of motion. Neck supple.  Cardiovascular: Normal rate, regular rhythm and normal heart sounds.   Pulmonary/Chest: Effort normal and breath sounds normal.  Abdominal: Soft. Bowel sounds are normal.  Insulin pump in LLQ  Musculoskeletal: Normal range of motion.  Neurological: She is alert and oriented to person, place, and time. Gait normal.  Skin: Skin is warm and dry.  Nursing note and vitals reviewed.  LABORATORY DATA:  CBC    Component Value Date/Time   WBC 7.0 09/07/2017 1033   RBC 4.22 09/07/2017 1033   HGB 13.0 09/07/2017 1033   HGB 11.5 08/13/2017 1232   HCT 39.3 09/07/2017 1033   HCT 34.9 08/13/2017 1232   PLT 53 (L) 09/07/2017 1033   PLT 41 (LL) 08/13/2017 1232   MCV 93.1 09/07/2017 1033   MCV 90 08/13/2017 1232   MCH 30.8 09/07/2017 1033   MCHC 33.1 09/07/2017 1033   RDW 12.9  09/07/2017 1033   RDW 13.1 08/13/2017 1232   LYMPHSABS 1.9 09/07/2017 1033   MONOABS 0.5 09/07/2017 1033   EOSABS 0.1 09/07/2017 1033   BASOSABS 0.0 09/07/2017 1033   CMP Latest Ref Rng & Units 08/13/2017 12/24/2016 10/28/2016  Glucose 65 - 99 mg/dL 150(H) 545(HH) 97  BUN 8 - 27 mg/dL 10 23(H) 10  Creatinine 0.57 - 1.00 mg/dL 0.79 1.11(H) 0.71  Sodium 134 - 144 mmol/L 141 131(L) 138  Potassium 3.5 - 5.2 mmol/L 4.5 4.9 5.0  Chloride 96 -  106 mmol/L 101 96(L) 103  CO2 20 - 29 mmol/L 26 25 30   Calcium 8.7 - 10.3 mg/dL 8.8 8.9 9.2  Total Protein 6.5 - 8.1 g/dL - 6.6 6.1(L)  Total Bilirubin 0.3 - 1.2 mg/dL - 1.2 0.5  Alkaline Phos 38 - 126 U/L - 77 68  AST 15 - 41 U/L - 33 24  ALT 14 - 54 U/L - 19 22   ASSESSMENT/PLAN:  Moderate Thrombocytopenia  Tobacco Abuse  Thrombocytopenia dating back to July 2016 with an acute change in platelet count.  Suspect underlying ITP.  Discussed with the patient how her platelets continue to decline and has gone from [facility] in March 2018 to 53k today. Discussed again bone marrow biopsy + aspirate to rule out underlying cause and patient has agreed to get it done.  Labs reviewed. Check platelets at next visit.   She will return for follow up in 1 month with CBC and to review bone marrow bx results.   All questions were answered. The patient knows to call the clinic with any problems, questions or concerns. We can certainly see the patient much sooner if necessary.   This note is electronically signed WI:[email or web address] Talbert Cage, MD  09/07/2017 11:31 AM

## 2017-09-13 ENCOUNTER — Ambulatory Visit
Admission: RE | Admit: 2017-09-13 | Discharge: 2017-09-13 | Disposition: A | Discharge status: Home / Self Care | Payer: PPO | Source: Ambulatory Visit | Attending: Internal Medicine | Admitting: Internal Medicine

## 2017-09-13 DIAGNOSIS — Z1231 Encounter for screening mammogram for malignant neoplasm of breast: Secondary | ICD-10-CM | POA: Diagnosis not present

## 2017-09-14 ENCOUNTER — Other Ambulatory Visit: Payer: Self-pay | Admitting: Radiology

## 2017-09-14 ENCOUNTER — Other Ambulatory Visit: Payer: Self-pay | Admitting: Student

## 2017-09-15 ENCOUNTER — Ambulatory Visit (HOSPITAL_COMMUNITY)
Admission: RE | Admit: 2017-09-15 | Discharge: 2017-09-15 | Disposition: A | Discharge status: Home / Self Care | Payer: PPO | Source: Ambulatory Visit | Attending: Oncology | Admitting: Oncology

## 2017-09-15 ENCOUNTER — Encounter (HOSPITAL_COMMUNITY): Payer: Self-pay

## 2017-09-15 DIAGNOSIS — D7589 Other specified diseases of blood and blood-forming organs: Secondary | ICD-10-CM | POA: Diagnosis not present

## 2017-09-15 DIAGNOSIS — D696 Thrombocytopenia, unspecified: Secondary | ICD-10-CM | POA: Diagnosis not present

## 2017-09-15 LAB — CBC WITH DIFFERENTIAL/PLATELET
Basophils Absolute: 0 10*3/uL (ref 0.0–0.1)
Basophils Relative: 1 %
Eosinophils Absolute: 0.2 10*3/uL (ref 0.0–0.7)
Eosinophils Relative: 3 %
HCT: 37.8 % (ref 36.0–46.0)
Hemoglobin: 12.7 g/dL (ref 12.0–15.0)
Lymphocytes Relative: 38 %
Lymphs Abs: 2.1 10*3/uL (ref 0.7–4.0)
MCH: 30.6 pg (ref 26.0–34.0)
MCHC: 33.6 g/dL (ref 30.0–36.0)
MCV: 91.1 fL (ref 78.0–100.0)
Monocytes Absolute: 0.5 10*3/uL (ref 0.1–1.0)
Monocytes Relative: 8 %
Neutro Abs: 2.8 10*3/uL (ref 1.7–7.7)
Neutrophils Relative %: 51 %
Platelets: 43 10*3/uL — ABNORMAL LOW (ref 150–400)
RBC: 4.15 MIL/uL (ref 3.87–5.11)
RDW: 12.8 % (ref 11.5–15.5)
WBC: 5.5 10*3/uL (ref 4.0–10.5)

## 2017-09-15 LAB — PROTIME-INR
INR: 0.93
Prothrombin Time: 12.4 seconds (ref 11.4–15.2)

## 2017-09-15 LAB — GLUCOSE, CAPILLARY: Glucose-Capillary: 133 mg/dL — ABNORMAL HIGH (ref 65–99)

## 2017-09-15 MED ORDER — FLUMAZENIL 0.5 MG/5ML IV SOLN
INTRAVENOUS | Status: AC
  Filled 2017-09-15: qty 5

## 2017-09-15 MED ORDER — HYDROCODONE-ACETAMINOPHEN 5-325 MG PO TABS: 1.0000 | ORAL_TABLET | ORAL | Status: DC | PRN

## 2017-09-15 MED ORDER — MIDAZOLAM HCL 2 MG/2ML IJ SOLN
INTRAMUSCULAR | Status: AC
  Filled 2017-09-15: qty 4

## 2017-09-15 MED ORDER — NALOXONE HCL 0.4 MG/ML IJ SOLN
INTRAMUSCULAR | Status: AC
  Filled 2017-09-15: qty 1

## 2017-09-15 MED ORDER — FENTANYL CITRATE (PF) 100 MCG/2ML IJ SOLN
INTRAMUSCULAR | Status: AC
  Filled 2017-09-15: qty 4

## 2017-09-15 MED ORDER — SODIUM CHLORIDE 0.9 % IV SOLN
INTRAVENOUS | Status: DC
  Administered 2017-09-15: 07:00:00 via INTRAVENOUS

## 2017-09-15 MED ORDER — FENTANYL CITRATE (PF) 100 MCG/2ML IJ SOLN
INTRAMUSCULAR | Status: AC | PRN
  Administered 2017-09-15: 50 ug via INTRAVENOUS

## 2017-09-15 MED ORDER — MIDAZOLAM HCL 2 MG/2ML IJ SOLN
INTRAMUSCULAR | Status: AC | PRN
Start: 2017-09-15 — End: 2017-09-15
  Administered 2017-09-15: 1 mg via INTRAVENOUS

## 2017-09-15 NOTE — Sedation Documentation (Signed)
Patient denies pain and is resting comfortably.  

## 2017-09-15 NOTE — Consult Note (Signed)
Chief Complaint: Patient was seen in consultation today for CT-guided bone marrow biopsy  Referring Physician(s): Zhou,Louise  Supervising Physician: Arne Cleveland  Patient Status: [facility] - Out-pt  History of Present Illness: Madeline [name] is a 83 y.o. female smoker with history of thrombocytopenia dating back to 2016 but currently worsening. She presents today for CT-guided bone marrow biopsy for further evaluation.  Past Medical History:  Diagnosis Date  . Diabetes mellitus   . Dyslipidemia   . Hyperthyroidism     Past Surgical History:  Procedure Laterality Date  . BREAST BIOPSY Right 1967   EXCISIONAL - NEG  . no surgical hx      Allergies: Darvon; Propoxyphene; and Sulfa antibiotics  Medications: Prior to Admission medications   Medication Sig Start Date End Date Taking? Authorizing Provider  atorvastatin (LIPITOR) 20 MG tablet TAKE ONE TABLET BY MOUTH ONCE DAILY 08/10/17  Yes Fay Records, MD  cetirizine (ZYRTEC) 10 MG tablet Take by mouth.   Yes [provider]  citalopram (CELEXA) 20 MG tablet Take 20 mg by mouth daily.     Yes [provider]  insulin glargine (LANTUS) 100 UNIT/ML injection Inject 15 Units into the skin at bedtime.    Yes [provider]  levothyroxine (SYNTHROID, LEVOTHROID) 75 MCG tablet Take 75 mcg by mouth daily.  06/07/17  Yes [provider]  lisinopril (PRINIVIL,ZESTRIL) 10 MG tablet Take 10 mg by mouth daily.  10/08/14  Yes [provider]  metoprolol tartrate (LOPRESSOR) 25 MG tablet TAKE ONE TABLET BY MOUTH TWICE DAILY 01/04/17  Yes Fay Records, MD  Multiple Vitamin (MULTIVITAMIN WITH MINERALS) TABS tablet Take 1 tablet by mouth daily.   Yes [provider]  insulin lispro (HUMALOG) 100 UNIT/ML injection Inject into the skin 3 (three) times daily before meals. Patient uses insulin pump    [provider]  nitroGLYCERIN (NITROSTAT) 0.4 MG SL tablet Place 0.4 mg under  the tongue every 5 (five) minutes as needed.      [provider]     Family History  Problem Relation Age of Onset  . Heart Problems Mother   . Other Father        UNK  . Cancer Sister        BREAST  . Breast cancer Sister 23  . Cancer Sister        BREAST  . Breast cancer Sister 30  . Cancer Sister        BREAST  . Diabetes Son     Social History   Social History  . Marital status: Married    Spouse name: N/A  . Number of children: N/A  . Years of education: N/A   Social History Main Topics  . Smoking status: Current Every Day Smoker    Packs/day: 1.00    Types: Cigarettes  . Smokeless tobacco: Never Used  . Alcohol use No  . Drug use: No  . Sexual activity: Not Asked   Other Topics Concern  . None   Social History Narrative  . None     Review of Systems currently denies fever, headache, chest pain, dyspnea, cough, abdominal pain, nausea, vomiting or bleeding. She does have some intermittent back pain and fatigue.  Vital Signs: BP (!) 149/77   Pulse 73   Temp 97.8 F (36.6 C) (Oral)   Resp 18   Ht 5' 2"  (1.575 m)   Wt 108 lb 8 oz (49.2 kg)   SpO2 99%  BMI 19.84 kg/m   Physical Exam  Awake, alert. Chest clear to auscultation bilaterally. Heart with normal rate, irregular rhythm; abdomen soft, positive bowel sounds, nontender ;left lower quadrant insulin pump; no lower extremity edema. Imaging: Mm Screening Breast Tomo Bilateral  Result Date: 09/13/2017 CLINICAL DATA:  Screening. EXAM: 2D DIGITAL SCREENING BILATERAL MAMMOGRAM WITH CAD AND ADJUNCT TOMO COMPARISON:  Previous exam(s). ACR Breast Density Category b: There are scattered areas of fibroglandular density. FINDINGS: There are no findings suspicious for malignancy. Images were processed with CAD. IMPRESSION: No mammographic evidence of malignancy. A result letter of this screening mammogram will be mailed directly to the patient. RECOMMENDATION: Screening mammogram in one year.  (Code:SM-B-01Y) BI-RADS CATEGORY  1: Negative. Electronically Signed   By: Margarette Canada M.D.   On: 09/13/2017 16:11    Labs:  CBC:  Recent Labs  01/18/17 0819 08/13/17 1232 09/07/17 1033 09/15/17 0712  WBC 5.3 6.2 7.0 5.5  HGB 13.0 11.5 13.0 12.7  HCT 39.4 34.9 39.3 37.8  PLT 77* 41* 53* 43*    COAGS:  Recent Labs  09/15/17 0712  INR 0.93    BMP:  Recent Labs  10/28/16 1225 12/24/16 1210 08/13/17 1232  NA 138 131* 141  K 5.0 4.9 4.5  CL 103 96* 101  CO2 30 25 26   GLUCOSE 97 545* 150*  BUN 10 23* 10  CALCIUM 9.2 8.9 8.8  CREATININE 0.71 1.11* 0.79  GFRNONAA >60 46* 70  GFRAA >60 53* 81    LIVER FUNCTION TESTS:  Recent Labs  10/28/16 1225 12/24/16 1210  BILITOT 0.5 1.2  AST 24 33  ALT 22 19  ALKPHOS 68 77  PROT 6.1* 6.6  ALBUMIN 3.5 3.8    TUMOR MARKERS: No results for input(s): AFPTM, CEA, CA199, CHROMGRNA in the last 8760 hours.  Assessment and Plan: 83 y.o. female smoker with history of thrombocytopenia dating back to 2016 but currently worsening. She presents today for CT-guided bone marrow biopsy for further evaluation.Risks and benefits discussed with the patient /spouse including, but not limited to bleeding, infection, damage to adjacent structures or low yield requiring additional tests.All of the patient's questions were answered, patient is agreeable to proceed.Consent signed and in chart.     Thank you for this interesting consult.  I greatly enjoyed meeting FEROL LAICHE and look forward to participating in their care.  A copy of this report was sent to the requesting provider on this date.  Electronically Signed: D. Rowe Robert, PA-C 09/15/2017, 8:29 AM   I spent a total of 25 minutes    in face to face in clinical consultation, greater than 50% of which was counseling/coordinating care for CT guided bone marrow biopsy

## 2017-09-15 NOTE — Procedures (Signed)
  Procedure: CT bone marrow biopsy R iliac 11g Preprocedure diagnosis: THrombocytopenia Postprocedure diagnosis: same EBL:   minimal Complications:  none immediate  See full dictation in [employer].  Dillard Cannon MD Main # [phone] Pager  [phone]

## 2017-09-15 NOTE — Discharge Instructions (Signed)
Bone Marrow Aspiration and Bone Marrow Biopsy, Adult, Care After This sheet gives you information about how to care for yourself after your procedure. Your health care provider may also give you more specific instructions. If you have problems or questions, contact your health care provider. What can I expect after the procedure? After the procedure, it is common to have:  Mild pain and tenderness.  Swelling.  Bruising.  Follow these instructions at home:  Take over-the-counter or prescription medicines only as told by your health care provider.  Do not take baths, swim, or use a hot tub until your health care provider approves. Ask if you can take a shower or have a sponge bath.  Follow instructions from your health care provider about how to take care of the puncture site. Make sure you: ? Wash your hands with soap and water before you change your bandage (dressing). If soap and water are not available, use hand sanitizer.  You may shower tomorrow 09/16/17. ? Change your dressing as told by your health care provider.  You may remove your dressing tomorrow 09/16/17.  Check your puncture siteevery day for signs of infection. Check for: ? More redness, swelling, or pain. ? More fluid or blood. ? Warmth. ? Pus or a bad smell.  Return to your normal activities as told by your health care provider. Ask your health care provider what activities are safe for you.  Do not drive for 24 hours if you were given a medicine to help you relax (sedative).  Keep all follow-up visits as told by your health care provider. This is important. Contact a health care provider if:  You have more redness, swelling, or pain around the puncture site.  You have more fluid or blood coming from the puncture site.  Your puncture site feels warm to the touch.  You have pus or a bad smell coming from the puncture site.  You have a fever.  Your pain is not controlled with medicine. This information is not  intended to replace advice given to you by your health care provider. Make sure you discuss any questions you have with your health care provider. Document Released: 05/22/2005 Document Revised: 05/22/2016 Document Reviewed: 04/15/2016 Elsevier Interactive Patient Education  2018 [employer].  Moderate Conscious Sedation, Adult, Care After These instructions provide you with information about caring for yourself after your procedure. Your health care provider may also give you more specific instructions. Your treatment has been planned according to current medical practices, but problems sometimes occur. Call your health care provider if you have any problems or questions after your procedure. What can I expect after the procedure? After your procedure, it is common:  To feel sleepy for several hours.  To feel clumsy and have poor balance for several hours.  To have poor judgment for several hours.  To vomit if you eat too soon.  Follow these instructions at home: For at least 24 hours after the procedure:   Do not: ? Participate in activities where you could fall or become injured. ? Drive. ? Use heavy machinery. ? Drink alcohol. ? Take sleeping pills or medicines that cause drowsiness. ? Make important decisions or sign legal documents. ? Take care of children on your own.  Rest. Eating and drinking  Follow the diet recommended by your health care provider.  If you vomit: ? Drink water, juice, or soup when you can drink without vomiting. ? Make sure you have little or no nausea before eating  solid foods. General instructions  Have a responsible adult stay with you until you are awake and alert.  Take over-the-counter and prescription medicines only as told by your health care provider.  If you smoke, do not smoke without supervision.  Keep all follow-up visits as told by your health care provider. This is important. Contact a health care provider if:  You keep  feeling nauseous or you keep vomiting.  You feel light-headed.  You develop a rash.  You have a fever. Get help right away if:  You have trouble breathing. This information is not intended to replace advice given to you by your health care provider. Make sure you discuss any questions you have with your health care provider. Document Released: 08/23/2013 Document Revised: 04/06/2016 Document Reviewed: 02/22/2016 Elsevier Interactive Patient Education  [employer].

## 2017-09-20 DIAGNOSIS — E113313 Type 2 diabetes mellitus with moderate nonproliferative diabetic retinopathy with macular edema, bilateral: Secondary | ICD-10-CM | POA: Diagnosis not present

## 2017-10-01 ENCOUNTER — Encounter (HOSPITAL_COMMUNITY): Payer: Self-pay

## 2017-10-01 LAB — CHROMOSOME ANALYSIS, BONE MARROW

## 2017-10-06 ENCOUNTER — Encounter (HOSPITAL_COMMUNITY): Payer: PPO | Attending: Oncology | Admitting: Oncology

## 2017-10-06 ENCOUNTER — Encounter (HOSPITAL_COMMUNITY): Payer: Self-pay | Admitting: Oncology

## 2017-10-06 ENCOUNTER — Encounter (HOSPITAL_COMMUNITY): Payer: PPO

## 2017-10-06 ENCOUNTER — Other Ambulatory Visit: Payer: Self-pay

## 2017-10-06 VITALS — BP 157/52 | HR 58 | Temp 97.8°F | Resp 16 | Wt 108.7 lb

## 2017-10-06 DIAGNOSIS — D696 Thrombocytopenia, unspecified: Secondary | ICD-10-CM | POA: Diagnosis not present

## 2017-10-06 DIAGNOSIS — Z720 Tobacco use: Secondary | ICD-10-CM

## 2017-10-06 LAB — CBC WITH DIFFERENTIAL/PLATELET
Basophils Absolute: 0 10*3/uL (ref 0.0–0.1)
Basophils Relative: 0 %
Eosinophils Absolute: 0.1 10*3/uL (ref 0.0–0.7)
Eosinophils Relative: 2 %
HCT: 39.5 % (ref 36.0–46.0)
Hemoglobin: 12.8 g/dL (ref 12.0–15.0)
Lymphocytes Relative: 45 %
Lymphs Abs: 2.2 10*3/uL (ref 0.7–4.0)
MCH: 30.5 pg (ref 26.0–34.0)
MCHC: 32.4 g/dL (ref 30.0–36.0)
MCV: 94 fL (ref 78.0–100.0)
Monocytes Absolute: 0.5 10*3/uL (ref 0.1–1.0)
Monocytes Relative: 10 %
Neutro Abs: 2 10*3/uL (ref 1.7–7.7)
Neutrophils Relative %: 42 %
Platelets: 32 10*3/uL — ABNORMAL LOW (ref 150–400)
RBC: 4.2 MIL/uL (ref 3.87–5.11)
RDW: 12.8 % (ref 11.5–15.5)
WBC: 4.8 10*3/uL (ref 4.0–10.5)

## 2017-10-06 NOTE — Patient Instructions (Addendum)
[facility] Discharge Instructions  RECOMMENDATIONS MADE BY THE CONSULTANT AND ANY TEST RESULTS WILL BE SENT TO YOUR REFERRING PHYSICIAN.  You were seen today by Dr. Louise Zhou Follow up in 3 months with labs   Thank you for choosing [facility] at [facility] to provide your oncology and hematology care.  To afford each patient quality time with our provider, please arrive at least 15 minutes before your scheduled appointment time.    If you have a lab appointment with the Cancer Center please come in thru the  Main Entrance and check in at the main information desk  You need to re-schedule your appointment should you arrive 10 or more minutes late.  We strive to give you quality time with our providers, and arriving late affects you and other patients whose appointments are after yours.  Also, if you no show three or more times for appointments you may be dismissed from the clinic at the providers discretion.     Again, thank you for choosing [facility].  Our hope is that these requests will decrease the amount of time that you wait before being seen by our physicians.       _____________________________________________________________  Should you have questions after your visit to [facility], please contact our office at [phone] between the hours of 8:30 a.m. and 4:30 p.m.  Voicemails left after 4:30 p.m. will not be returned until the following business day.  For prescription refill requests, have your pharmacy contact our office.       Resources For Cancer Patients and their Caregivers ? American Cancer Society: Can assist with transportation, wigs, general needs, runs Look Good Feel Better.        1-888-227-6333 ? Cancer Care: Provides financial assistance, online support groups, medication/co-pay assistance.  1-800-813-HOPE (4673) ? [facility] Assists  [place] cancer patients and their families through emotional , educational and financial support.  336-427-4357 ? [place] DSS Where to apply for food stamps, Medicaid and utility assistance. 336-342-1394 ? [facility]: Transportation to medical appointments. 336-347-2287 ? Social Security Administration: May apply for disability if have a Stage IV cancer. 336-342-7796 1-800-772-1213 ? [place] Aging, Disability and Transit Services: Assists with nutrition, care and transit needs. 336-349-2343  Cancer Center Support Programs: @10RELATIVEDAYS@ > Cancer Support Group  2nd Tuesday of the month 1pm-2pm, Journey Room  > Creative Journey  3rd Tuesday of the month 1130am-1pm, Journey Room  > Look Good Feel Better  1st Wednesday of the month 10am-12 noon, Journey Room (Call American Cancer Society to register 1-800-395-5775)     

## 2017-10-06 NOTE — Progress Notes (Signed)
[facility] Hematology/Oncology Progress Note  Name: Madeline [name]      MRN: [id number]    Date: 10/06/2017 Time:1:55 PM   REFERRING PHYSICIAN:  Dorris Carnes, MD (Cardiologist)  REASON FOR CONSULT:  Thrombocytopenia   DIAGNOSIS:  Thrombocytopenia, sudden/acute onset in July 2016.  Relatively stable since.  HISTORY OF PRESENT ILLNESS:   Madeline [name] is a 83 y.o. female with a medical history significant for CAD, dyslipidemia, tobacco abuse, depression, DM, insulin dependent, and hypothyroidism who returns to the [facility] for follow up of thrombocytopenia.   Patient presents today for review of her bone marrow biopsy results. She is doing well today. She has not noted any problems with bleeding or bruising. She denies blood in stool, hematuria, chest pain, SOB, abdominal pain, loss of appetite, or any other concerns.   Review of Systems  Constitutional: Negative.   HENT: Negative.   Eyes: Negative.   Respiratory: Negative.  Negative for shortness of breath.   Cardiovascular: Negative.  Negative for chest pain.  Gastrointestinal: Negative.  Negative for abdominal pain and blood in stool.  Genitourinary: Negative.  Negative for hematuria.  Musculoskeletal: Positive for back pain. Negative for falls.  Skin: Negative.   Neurological: Negative for dizziness.  Endo/Heme/Allergies: Does not bruise/bleed easily.  Psychiatric/Behavioral: Negative.   All other systems reviewed and are negative. 14 point review of systems was performed and is negative except as detailed under history of present illness and above  PAST MEDICAL HISTORY:   Past Medical History:  Diagnosis Date  . Diabetes mellitus   . Dyslipidemia   . Hyperthyroidism     ALLERGIES: Allergies  Allergen Reactions  . Darvon     "FEEL SICK"  . Propoxyphene Nausea Only and Nausea And Vomiting  . Sulfa Antibiotics Nausea Only and Nausea And Vomiting    "FEEL SICK"         MEDICATIONS: I have reviewed the patient's current medications.    Current Outpatient Medications on File Prior to Visit  Medication Sig Dispense Refill  . atorvastatin (LIPITOR) 20 MG tablet TAKE ONE TABLET BY MOUTH ONCE DAILY 90 tablet 0  . cetirizine (ZYRTEC) 10 MG tablet Take by mouth.    . citalopram (CELEXA) 20 MG tablet Take 20 mg by mouth daily.      . insulin glargine (LANTUS) 100 UNIT/ML injection Inject 15 Units into the skin at bedtime.     . insulin lispro (HUMALOG) 100 UNIT/ML injection Inject into the skin 3 (three) times daily before meals. Patient uses insulin pump    . levothyroxine (SYNTHROID, LEVOTHROID) 75 MCG tablet Take 75 mcg by mouth daily.     Marland Kitchen lisinopril (PRINIVIL,ZESTRIL) 10 MG tablet Take 10 mg by mouth daily.     . metoprolol tartrate (LOPRESSOR) 25 MG tablet TAKE ONE TABLET BY MOUTH TWICE DAILY 180 tablet 3  . Multiple Vitamin (MULTIVITAMIN WITH MINERALS) TABS tablet Take 1 tablet by mouth daily.    . nitroGLYCERIN (NITROSTAT) 0.4 MG SL tablet Place 0.4 mg under the tongue every 5 (five) minutes as needed.       No current facility-administered medications on file prior to visit.      PAST SURGICAL HISTORY Past Surgical History:  Procedure Laterality Date  . BREAST BIOPSY Right 1967   EXCISIONAL - NEG  . no surgical hx      FAMILY HISTORY: Family History  Problem Relation Age of Onset  . Heart Problems  Mother   . Other Father        UNK  . Cancer Sister        BREAST  . Breast cancer Sister 6  . Cancer Sister        BREAST  . Breast cancer Sister 43  . Cancer Sister        BREAST  . Diabetes Son    Mother deceased at age 34 from complications of heart disease. Father passed at the age of 64's from stroke. She has 4 children. Son 48 yo with DM Son 43 yo healthy Daughter 7 yo with pacemaker Daughter 40 yo with headaches 8 siblings in total, 4 deceased from [place], unknown cancer, and heart disease (x2).  SOCIAL HISTORY: She admits to  a 55 pack year smoking history, 1 ppd x 55+ years.  She denies any EtOH or illicit drug use.  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  She is married x 63 years.  Social History   Socioeconomic History  . Marital status: Married    Spouse name: None  . Number of children: None  . Years of education: None  . Highest education level: None  Social Needs  . Financial resource strain: None  . Food insecurity - worry: None  . Food insecurity - inability: None  . Transportation needs - medical: None  . Transportation needs - non-medical: None  Occupational History  . None  Tobacco Use  . Smoking status: Current Every Day Smoker    Packs/day: 1.00    Types: Cigarettes  . Smokeless tobacco: Never Used  Substance and Sexual Activity  . Alcohol use: No  . Drug use: No  . Sexual activity: None  Other Topics Concern  . None  Social History Narrative  . None    PERFORMANCE STATUS: The patient's performance status is 1 - Symptomatic but completely ambulatory  PHYSICAL EXAM: Most Recent Vital Signs: Blood pressure (!) 157/52, pulse (!) 58, temperature 97.8 F (36.6 C), temperature source Oral, resp. rate 16, weight 108 lb 11.2 oz (49.3 kg), SpO2 99 %. Physical Exam  Constitutional: She is oriented to person, place, and time and well-developed, well-nourished, and in no distress.  HENT:  Head: Normocephalic and atraumatic.  Eyes: EOM are normal. Pupils are equal, round, and reactive to light.  Neck: Normal range of motion. Neck supple.  Cardiovascular: Normal rate, regular rhythm and normal heart sounds.  Pulmonary/Chest: Effort normal and breath sounds normal.  Abdominal: Soft. Bowel sounds are normal.  Insulin pump in LLQ  Musculoskeletal: Normal range of motion.  Neurological: She is alert and oriented to person, place, and time. Gait normal.  Skin: Skin is warm and dry.  Nursing note and vitals reviewed.  LABORATORY DATA:  CBC    Component Value  Date/Time   WBC 5.5 09/15/2017 0712   RBC 4.15 09/15/2017 0712   HGB 12.7 09/15/2017 0712   HGB 11.5 08/13/2017 1232   HCT 37.8 09/15/2017 0712   HCT 34.9 08/13/2017 1232   PLT 43 (L) 09/15/2017 0712   PLT 41 (LL) 08/13/2017 1232   MCV 91.1 09/15/2017 0712   MCV 90 08/13/2017 1232   MCH 30.6 09/15/2017 0712   MCHC 33.6 09/15/2017 0712   RDW 12.8 09/15/2017 0712   RDW 13.1 08/13/2017 1232   LYMPHSABS 2.1 09/15/2017 0712   MONOABS 0.5 09/15/2017 0712   EOSABS 0.2 09/15/2017 0712   BASOSABS 0.0 09/15/2017 0712   CMP Latest Ref  Rng & Units 08/13/2017 12/24/2016 10/28/2016  Glucose 65 - 99 mg/dL 150(H) 545(HH) 97  BUN 8 - 27 mg/dL 10 23(H) 10  Creatinine 0.57 - 1.00 mg/dL 0.79 1.11(H) 0.71  Sodium 134 - 144 mmol/L 141 131(L) 138  Potassium 3.5 - 5.2 mmol/L 4.5 4.9 5.0  Chloride 96 - 106 mmol/L 101 96(L) 103  CO2 20 - 29 mmol/L 26 25 30   Calcium 8.7 - 10.3 mg/dL 8.8 8.9 9.2  Total Protein 6.5 - 8.1 g/dL - 6.6 6.1(L)  Total Bilirubin 0.3 - 1.2 mg/dL - 1.2 0.5  Alkaline Phos 38 - 126 U/L - 77 68  AST 15 - 41 U/L - 33 24  ALT 14 - 54 U/L - 19 22   Bone Marrow Biopsy 09/15/17:  BONE MARROW REPORT FINAL DIAGNOSIS Diagnosis Bone Marrow, Aspirate,Biopsy, and Clot, right iliac - SLIGHTLY HYPERCELLULAR BONE MARROW FOR AGE WITH TRILINEAGE HEMATOPOIESIS INCLUDING ABUNDANT MEGAKARYOCYTES. - SEVERAL LYMPHOID AGGREGATES PRESENT. - SEE COMMENT. PERIPHERAL BLOOD: - THROMBOCYTOPENIA. Diagnosis Note The bone marrow is generally slightly hypercellular for age with trilineage hematopoiesis including abundant megakaryocytes displaying predominantly normal morphology. Significant dyspoietic changes are not seen. In the presence of thrombocytopenia, the findings are consistent with peripheral destruction/consumption or sequestration of platelets. The bone marrow also shows several small lymphoid aggregates mostly composed of small lymphoid cells. Immunohistochemical stains and flow cytometric  studies failed to show any significant T or B-cell phenotypic abnormalities and hence the lymphoid aggregates likely represent a reactive process. Correlation with cytogenetic studies is recommended. (BNS:ecj:ah 11/1-12/2016) Susanne Greenhouse MD Pathologist, Electronic Signature (Case signed 09/17/2017)  Cytogenetics: Normal female karyotype.  Patient: Madeline [name], Madeline [name] Collected: 09/15/2017 Client: [facility] Accession: [id number] Received: 09/15/2017 D. Arne Cleveland, MD DOB: 08-30-1934 Age: 69 Gender: F Reported: 09/17/2017 [place] Patient Ph: [phone] MRN #: [id number] [place], [place] [address] Visit #: [id number] Chart #: Phone: [phone] Fax: CC: Twana First, MD OW CYTOMETRY REPORT INTERPRETATION Interpretation Bone Marrow Flow Cytometry - NO MONOCLONAL B-CELL POPULATION OR ABNORMAL T-CELL PHENOTYPE IDENTIFIED. Susanne Greenhouse MD Pathologist, Electronic Signature   ASSESSMENT/PLAN:  Moderate Thrombocytopenia  Tobacco Abuse  Thrombocytopenia dating back to July 2016 with an acute change in platelet count. Abd [place] in 2017 was negative for splenomegaly.  Suspect underlying ITP.  Reviewed her bone marrow biopsy results with her in detail today. The bone marrow is generally slightly hypercellular for age with trilineage hematopoiesis including abundant megakaryocytes displaying predominantly normal morphology. Significant dyspoietic changes are not seen. In the presence of thrombocytopenia, the findings are consistent with peripheral destruction/consumption or sequestration of platelets.  Platelets down to 32k today. Will follow the below algorithm for treatment of ITP. Will do a short interval follow up with labs in 4 weeks. If platelets go down below 30k, then will initiate prednisone 33m/kg PO daily (574mPO daily) to be slowly tapered over 5 weeks.     Labs at 4 weeks and 8 weeks. RTC in 8 weeks for follow up.   Orders Placed This Encounter   Procedures  . CBC with Differential    Standing Status:   Future    Standing Expiration Date:   10/06/2018  . Comprehensive metabolic panel    Standing Status:   Future    Standing Expiration Date:   10/06/2018      All questions were answered. The patient knows to call the clinic with any problems, questions or concerns. We can certainly see the patient much sooner if necessary.   This note  is electronically signed KP:[id number] Talbert Cage, MD  10/06/2017 1:55 PM

## 2017-10-15 DIAGNOSIS — E038 Other specified hypothyroidism: Secondary | ICD-10-CM | POA: Diagnosis not present

## 2017-10-15 DIAGNOSIS — E1065 Type 1 diabetes mellitus with hyperglycemia: Secondary | ICD-10-CM | POA: Diagnosis not present

## 2017-10-15 DIAGNOSIS — E063 Autoimmune thyroiditis: Secondary | ICD-10-CM | POA: Diagnosis not present

## 2017-10-22 DIAGNOSIS — E1065 Type 1 diabetes mellitus with hyperglycemia: Secondary | ICD-10-CM | POA: Diagnosis not present

## 2017-10-22 DIAGNOSIS — Z9641 Presence of insulin pump (external) (internal): Secondary | ICD-10-CM | POA: Diagnosis not present

## 2017-10-22 DIAGNOSIS — E063 Autoimmune thyroiditis: Secondary | ICD-10-CM | POA: Diagnosis not present

## 2017-10-22 DIAGNOSIS — E1043 Type 1 diabetes mellitus with diabetic autonomic (poly)neuropathy: Secondary | ICD-10-CM | POA: Diagnosis not present

## 2017-10-22 DIAGNOSIS — E038 Other specified hypothyroidism: Secondary | ICD-10-CM | POA: Diagnosis not present

## 2017-10-22 DIAGNOSIS — F17200 Nicotine dependence, unspecified, uncomplicated: Secondary | ICD-10-CM | POA: Diagnosis not present

## 2017-11-04 ENCOUNTER — Encounter (HOSPITAL_COMMUNITY): Payer: PPO | Attending: Oncology

## 2017-11-04 DIAGNOSIS — D696 Thrombocytopenia, unspecified: Secondary | ICD-10-CM | POA: Insufficient documentation

## 2017-11-04 LAB — CBC WITH DIFFERENTIAL/PLATELET
Basophils Absolute: 0 10*3/uL (ref 0.0–0.1)
Basophils Relative: 1 %
Eosinophils Absolute: 0.2 10*3/uL (ref 0.0–0.7)
Eosinophils Relative: 2 %
HCT: 39.6 % (ref 36.0–46.0)
Hemoglobin: 12.9 g/dL (ref 12.0–15.0)
Lymphocytes Relative: 47 %
Lymphs Abs: 3.9 10*3/uL (ref 0.7–4.0)
MCH: 30.4 pg (ref 26.0–34.0)
MCHC: 32.6 g/dL (ref 30.0–36.0)
MCV: 93.4 fL (ref 78.0–100.0)
Monocytes Absolute: 0.7 10*3/uL (ref 0.1–1.0)
Monocytes Relative: 8 %
Neutro Abs: 3.4 10*3/uL (ref 1.7–7.7)
Neutrophils Relative %: 42 %
Platelets: 46 10*3/uL — ABNORMAL LOW (ref 150–400)
RBC: 4.24 MIL/uL (ref 3.87–5.11)
RDW: 12.9 % (ref 11.5–15.5)
WBC: 8.2 10*3/uL (ref 4.0–10.5)

## 2017-11-04 LAB — COMPREHENSIVE METABOLIC PANEL
ALT: 26 U/L (ref 14–54)
AST: 34 U/L (ref 15–41)
Albumin: 3.5 g/dL (ref 3.5–5.0)
Alkaline Phosphatase: 77 U/L (ref 38–126)
Anion gap: 10 (ref 5–15)
BUN: 9 mg/dL (ref 6–20)
CO2: 26 mmol/L (ref 22–32)
Calcium: 8.9 mg/dL (ref 8.9–10.3)
Chloride: 102 mmol/L (ref 101–111)
Creatinine, Ser: 0.75 mg/dL (ref 0.44–1.00)
GFR calc Af Amer: >60 mL/min (ref >60)
GFR calc non Af Amer: >60 mL/min (ref >60)
Glucose, Bld: 65 mg/dL (ref 65–99)
Potassium: 3.8 mmol/L (ref 3.5–5.1)
Sodium: 138 mmol/L (ref 135–145)
Total Bilirubin: 0.7 mg/dL (ref 0.3–1.2)
Total Protein: 6.3 g/dL — ABNORMAL LOW (ref 6.5–8.1)

## 2017-11-18 ENCOUNTER — Other Ambulatory Visit: Payer: Self-pay | Admitting: Internal Medicine

## 2017-11-26 DIAGNOSIS — E1065 Type 1 diabetes mellitus with hyperglycemia: Secondary | ICD-10-CM | POA: Diagnosis not present

## 2017-12-06 ENCOUNTER — Inpatient Hospital Stay (HOSPITAL_BASED_OUTPATIENT_CLINIC_OR_DEPARTMENT_OTHER): Payer: PPO | Admitting: Oncology

## 2017-12-06 ENCOUNTER — Encounter (HOSPITAL_COMMUNITY): Payer: Self-pay | Admitting: Oncology

## 2017-12-06 ENCOUNTER — Inpatient Hospital Stay (HOSPITAL_COMMUNITY): Payer: PPO | Attending: Oncology

## 2017-12-06 ENCOUNTER — Other Ambulatory Visit: Payer: Self-pay

## 2017-12-06 VITALS — BP 170/49 | HR 53 | Temp 98.0°F | Resp 18 | Wt 110.6 lb

## 2017-12-06 DIAGNOSIS — Z720 Tobacco use: Secondary | ICD-10-CM | POA: Diagnosis not present

## 2017-12-06 DIAGNOSIS — D696 Thrombocytopenia, unspecified: Secondary | ICD-10-CM

## 2017-12-06 LAB — CBC WITH DIFFERENTIAL/PLATELET
Basophils Absolute: 0 10*3/uL (ref 0.0–0.1)
Basophils Relative: 1 %
Eosinophils Absolute: 0.1 10*3/uL (ref 0.0–0.7)
Eosinophils Relative: 2 %
HCT: 39.7 % (ref 36.0–46.0)
Hemoglobin: 12.8 g/dL (ref 12.0–15.0)
Lymphocytes Relative: 39 %
Lymphs Abs: 2.3 10*3/uL (ref 0.7–4.0)
MCH: 30.4 pg (ref 26.0–34.0)
MCHC: 32.2 g/dL (ref 30.0–36.0)
MCV: 94.3 fL (ref 78.0–100.0)
Monocytes Absolute: 0.4 10*3/uL (ref 0.1–1.0)
Monocytes Relative: 7 %
Neutro Abs: 3 10*3/uL (ref 1.7–7.7)
Neutrophils Relative %: 51 %
Platelets: 26 10*3/uL — CL (ref 150–400)
RBC: 4.21 MIL/uL (ref 3.87–5.11)
RDW: 12.8 % (ref 11.5–15.5)
WBC: 5.8 10*3/uL (ref 4.0–10.5)

## 2017-12-06 MED ORDER — PREDNISONE 10 MG PO TABS: 10.0000 mg | ORAL_TABLET | Freq: Every day | ORAL | 0 refills | Status: DC

## 2017-12-06 NOTE — Patient Instructions (Signed)
[facility] at [facility] Discharge Instructions  RECOMMENDATIONS MADE BY THE CONSULTANT AND ANY TEST RESULTS WILL BE SENT TO YOUR REFERRING PHYSICIAN.    Thank you for choosing [facility] at [facility] to provide your oncology and hematology care.  To afford each patient quality time with our provider, please arrive at least 15 minutes before your scheduled appointment time.    If you have a lab appointment with the Cancer Center please come in thru the  Main Entrance and check in at the main information desk  You need to re-schedule your appointment should you arrive 10 or more minutes late.  We strive to give you quality time with our providers, and arriving late affects you and other patients whose appointments are after yours.  Also, if you no show three or more times for appointments you may be dismissed from the clinic at the providers discretion.     Again, thank you for choosing [facility].  Our hope is that these requests will decrease the amount of time that you wait before being seen by our physicians.       _____________________________________________________________  Should you have questions after your visit to [facility], please contact our office at [phone] between the hours of 8:30 a.m. and 4:30 p.m.  Voicemails left after 4:30 p.m. will not be returned until the following business day.  For prescription refill requests, have your pharmacy contact our office.       Resources For Cancer Patients and their Caregivers ? American Cancer Society: Can assist with transportation, wigs, general needs, runs Look Good Feel Better.        1-888-227-6333 ? Cancer Care: Provides financial assistance, online support groups, medication/co-pay assistance.  1-800-813-HOPE (4673) ? [facility] Assists [place] cancer patients and their families through emotional , educational  and financial support.  336-427-4357 ? [place] DSS Where to apply for food stamps, Medicaid and utility assistance. 336-342-1394 ? RCATS: Transportation to medical appointments. 336-347-2287 ? Social Security Administration: May apply for disability if have a Stage IV cancer. 336-342-7796 1-800-772-1213 ? [place] Aging, Disability and Transit Services: Assists with nutrition, care and transit needs. 336-349-2343  Cancer Center Support Programs: @10RELATIVEDAYS@ > Cancer Support Group  2nd Tuesday of the month 1pm-2pm, Journey Room  > Creative Journey  3rd Tuesday of the month 1130am-1pm, Journey Room  > Look Good Feel Better  1st Wednesday of the month 10am-12 noon, Journey Room (Call American Cancer Society to register 1-800-395-5775)   

## 2017-12-06 NOTE — Progress Notes (Signed)
CRITICAL VALUE ALERT Critical value received:  Platelets 26,000 Date of notification:  12/06/2017 Time of notification: 1418 Critical value read back:  Yes.   Nurse who received alert:  Estanislado EmmsJennifer Shaka Zech RN MD notified (1st page):  Durenda HurtJennifer Burns NP

## 2017-12-06 NOTE — Progress Notes (Signed)
[facility] Hematology/Oncology Progress Note  Name: Madeline [name]      MRN: [id number]    Date: 12/07/2017 Time:1:06 PM   REFERRING PHYSICIAN:  Dorris Carnes, MD (Cardiologist)  REASON FOR CONSULT:  Thrombocytopenia   DIAGNOSIS:  Thrombocytopenia, sudden/acute onset in July 2016.  Relatively stable since.  HISTORY OF PRESENT ILLNESS:   Madeline [name] is a 84 y.o. female with a medical history significant for CAD, dyslipidemia, tobacco abuse, depression, DM, insulin dependent, and hypothyroidism who returns to the [facility] for follow up of thrombocytopenia.   Patient returns today for follow-up and results of her lab work.  Madeline [name] states Madeline [name] is doing well today.  Madeline [name] notes her appetite to be 100% and energy levels to be 25%.  Madeline [name] denies any pain Madeline [name] has not noted any problems with bleeding or bruising.  Madeline [name] denies blood in her stool, hematuria, chest pain, shortness of breath, abdominal pain, appetite change or any additional concerns.  Review of Systems  Constitutional: Negative.   HENT: Negative.   Eyes: Negative.   Respiratory: Negative.  Negative for shortness of breath.   Cardiovascular: Negative.  Negative for chest pain.  Gastrointestinal: Negative.  Negative for abdominal pain and blood in stool.  Genitourinary: Negative.  Negative for hematuria.  Musculoskeletal: Negative for back pain and falls.  Skin: Negative.   Neurological: Negative for dizziness.  Endo/Heme/Allergies: Does not bruise/bleed easily.  Psychiatric/Behavioral: Negative.   All other systems reviewed and are negative. 14 point review of systems was performed and is negative except as detailed under history of present illness and above  PAST MEDICAL HISTORY:   Past Medical History:  Diagnosis Date  . Diabetes mellitus   . Dyslipidemia   . Hyperthyroidism     ALLERGIES: Allergies  Allergen Reactions  . Darvon     "FEEL SICK"  . Propoxyphene Nausea Only and Nausea  And Vomiting  . Sulfa Antibiotics Nausea Only and Nausea And Vomiting    "FEEL SICK"       MEDICATIONS: I have reviewed the patient's current medications.    Current Outpatient Medications on File Prior to Visit  Medication Sig Dispense Refill  . atorvastatin (LIPITOR) 20 MG tablet TAKE 1 TABLET BY MOUTH ONCE DAILY *PLEASE  KEEP  UPCOMING  APPOINTMENT  FOR  FUTURE  REFILLS* 90 tablet 3  . cetirizine (ZYRTEC) 10 MG tablet Take by mouth.    . citalopram (CELEXA) 20 MG tablet Take 20 mg by mouth daily.      . insulin lispro (HUMALOG) 100 UNIT/ML injection Inject into the skin 3 (three) times daily before meals. Patient uses insulin pump    . levothyroxine (SYNTHROID, LEVOTHROID) 75 MCG tablet Take 75 mcg by mouth daily.     Marland Kitchen lisinopril (PRINIVIL,ZESTRIL) 10 MG tablet Take 10 mg by mouth daily.     . metoprolol tartrate (LOPRESSOR) 25 MG tablet TAKE ONE TABLET BY MOUTH TWICE DAILY 180 tablet 3  . Multiple Vitamin (MULTIVITAMIN WITH MINERALS) TABS tablet Take 1 tablet by mouth daily.    . nitroGLYCERIN (NITROSTAT) 0.4 MG SL tablet Place 0.4 mg under the tongue every 5 (five) minutes as needed.       No current facility-administered medications on file prior to visit.      PAST SURGICAL HISTORY Past Surgical History:  Procedure Laterality Date  . BREAST BIOPSY Right 1967   EXCISIONAL - NEG  . no surgical hx  FAMILY HISTORY: Family History  Problem Relation Age of Onset  . Heart Problems Mother   . Other Father        UNK  . Cancer Sister        BREAST  . Breast cancer Sister 4  . Cancer Sister        BREAST  . Breast cancer Sister 60  . Cancer Sister        BREAST  . Diabetes Son    Mother deceased at age 44 from complications of heart disease. Father passed at the age of 24's from stroke. Madeline [name] has 4 children. Son 52 yo with DM Son 44 yo healthy Daughter 34 yo with pacemaker Daughter 1 yo with headaches 8 siblings in total, 4 deceased from [place], unknown cancer, and  heart disease (x2).  SOCIAL HISTORY: Madeline [name] admits to a 55 pack year smoking history, 1 ppd x 55+ years.  Madeline [name] denies any EtOH or illicit drug use.  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  Madeline [name] is retired from [employer] work.  Madeline [name] is married x 63 years.  Social History   Socioeconomic History  . Marital status: Married    Spouse name: None  . Number of children: None  . Years of education: None  . Highest education level: None  Social Needs  . Financial resource strain: None  . Food insecurity - worry: None  . Food insecurity - inability: None  . Transportation needs - medical: None  . Transportation needs - non-medical: None  Occupational History  . None  Tobacco Use  . Smoking status: Current Every Day Smoker    Packs/day: 1.00    Types: Cigarettes  . Smokeless tobacco: Never Used  Substance and Sexual Activity  . Alcohol use: No  . Drug use: No  . Sexual activity: None  Other Topics Concern  . None  Social History Narrative  . None    PERFORMANCE STATUS: The patient's performance status is 1 - Symptomatic but completely ambulatory  PHYSICAL EXAM: Most Recent Vital Signs: Blood pressure (!) 170/49, pulse (!) 53, temperature 98 F (36.7 C), temperature source Oral, resp. rate 18, weight 110 lb 9.6 oz (50.2 kg), SpO2 99 %. Physical Exam  Constitutional: Madeline [name] is oriented to person, place, and time and well-developed, well-nourished, and in no distress.  HENT:  Head: Normocephalic and atraumatic.  Eyes: EOM are normal. Pupils are equal, round, and reactive to light.  Neck: Normal range of motion. Neck supple.  Cardiovascular: Normal rate, regular rhythm and normal heart sounds.  Pulmonary/Chest: Effort normal and breath sounds normal.  Abdominal: Soft. Bowel sounds are normal.  Insulin pump in LLQ  Musculoskeletal: Normal range of motion.  Neurological: Madeline [name] is alert and oriented to person, place, and time. Gait normal.  Skin: Skin is warm and dry.  Nursing note and vitals  reviewed.  LABORATORY DATA:  CBC    Component Value Date/Time   WBC 5.8 12/06/2017 1254   RBC 4.21 12/06/2017 1254   HGB 12.8 12/06/2017 1254   HGB 11.5 08/13/2017 1232   HCT 39.7 12/06/2017 1254   HCT 34.9 08/13/2017 1232   PLT 26 (LL) 12/06/2017 1254   PLT 41 (LL) 08/13/2017 1232   MCV 94.3 12/06/2017 1254   MCV 90 08/13/2017 1232   MCH 30.4 12/06/2017 1254   MCHC 32.2 12/06/2017 1254   RDW 12.8 12/06/2017 1254   RDW 13.1 08/13/2017 1232   LYMPHSABS 2.3 12/06/2017 1254   MONOABS 0.4 12/06/2017 1254   EOSABS  0.1 12/06/2017 1254   BASOSABS 0.0 12/06/2017 1254   CMP Latest Ref Rng & Units 11/04/2017 08/13/2017 12/24/2016  Glucose 65 - 99 mg/dL 65 150(H) 545(HH)  BUN 6 - 20 mg/dL 9 10 23(H)  Creatinine 0.44 - 1.00 mg/dL 0.75 0.79 1.11(H)  Sodium 135 - 145 mmol/L 138 141 131(L)  Potassium 3.5 - 5.1 mmol/L 3.8 4.5 4.9  Chloride 101 - 111 mmol/L 102 101 96(L)  CO2 22 - 32 mmol/L _0 Calcium 8.9 - 10.3 mg/dL 8.9 8.8 8.9  Total Protein 6.5 - 8.1 g/dL 6.3(L) - 6.6  Total Bilirubin 0.3 - 1.2 mg/dL 0.7 - 1.2  Alkaline Phos 38 - 126 U/L 77 - 77  AST 15 - 41 U/L 34 - 33  ALT 14 - 54 U/L 26 - 19   Bone Marrow Biopsy 09/15/17:  BONE MARROW REPORT FINAL DIAGNOSIS Diagnosis Bone Marrow, Aspirate,Biopsy, and Clot, right iliac - SLIGHTLY HYPERCELLULAR BONE MARROW FOR AGE WITH TRILINEAGE HEMATOPOIESIS INCLUDING ABUNDANT MEGAKARYOCYTES. - SEVERAL LYMPHOID AGGREGATES PRESENT. - SEE COMMENT. PERIPHERAL BLOOD: - THROMBOCYTOPENIA. Diagnosis Note The bone marrow is generally slightly hypercellular for age with trilineage hematopoiesis including abundant megakaryocytes displaying predominantly normal morphology. Significant dyspoietic changes are not seen. In the presence of thrombocytopenia, the findings are consistent with peripheral destruction/consumption or sequestration of platelets. The bone marrow also shows several small lymphoid aggregates mostly composed of small  lymphoid cells. Immunohistochemical stains and flow cytometric studies failed to show any significant T or B-cell phenotypic abnormalities and hence the lymphoid aggregates likely represent a reactive process. Correlation with cytogenetic studies is recommended. (BNS:ecj:ah 11/1-12/2016) Susanne Greenhouse MD Pathologist, Electronic Signature (Case signed 09/17/2017)  Cytogenetics: Normal female karyotype.  Patient: Madeline [name], Madeline [name] Collected: 09/15/2017 Client: [facility] Accession: [id number] Received: 09/15/2017 D. Arne Cleveland, MD DOB: 1934-08-22 Age: 44 Gender: F Reported: 09/17/2017 [place] Patient Ph: [phone] MRN #: [id number] [place], [place] [address] Visit #: [id number].[id number] Chart #: Phone: [phone] Fax: CC: Twana First, MD OW CYTOMETRY REPORT INTERPRETATION Interpretation Bone Marrow Flow Cytometry - NO MONOCLONAL B-CELL POPULATION OR ABNORMAL T-CELL PHENOTYPE IDENTIFIED. Susanne Greenhouse MD Pathologist, Electronic Signature   ASSESSMENT/PLAN:  Moderate Thrombocytopenia  Tobacco Abuse  Thrombocytopenia dating back to July 2016 with an acute change in platelet count. Abd [place] in 2017 was negative for splenomegaly.  Suspect underlying ITP.  Reviewed her lab results with her today in detail.  Platelet count continues to trend down and is currently 26,000.  Patient asymptomati. Per Dr. Laverle Patter  previous note a algorithm for treatment of ITP, will start patient on a 5-week slow taper of prednisone 1 mg/kg daily.    Prescription written for Prenisone 50 mg daily for week 1, 40 mg daily for week 2, 30 mg daily for week 3, 20 mg daily for a week 4 and 10 mg daily for week 5.  Prednisone side effects  reviewed in detail and education handout provided.  All questions were answered.  We will bring her back in 2 weeks for labs only and we will see her again in 5 weeks with labs and MD assessment.       No orders of the defined types were placed in this  encounter.  Greater than 50% was spent in counseling and coordination of care with this patient including but not limited to discussion of the relevant topics above (See A&P) including, but not limited to diagnosis and management of acute and chronic medical conditions.   All questions were  answered. The patient knows to call the clinic with any problems, questions or concerns. We can certainly see the patient much sooner if necessary.   This note is electronically signed [id number] Ciro Backer, NP  12/07/2017 1:06 PM Dictation #1 [id number]  [id number]

## 2017-12-07 MED ORDER — PREDNISONE 10 MG PO TABS: 10.0000 mg | ORAL_TABLET | Freq: Every day | ORAL | 0 refills | Status: DC

## 2017-12-17 DIAGNOSIS — Z79899 Other long term (current) drug therapy: Secondary | ICD-10-CM | POA: Diagnosis not present

## 2017-12-17 DIAGNOSIS — E1042 Type 1 diabetes mellitus with diabetic polyneuropathy: Secondary | ICD-10-CM | POA: Diagnosis not present

## 2017-12-17 DIAGNOSIS — E7800 Pure hypercholesterolemia, unspecified: Secondary | ICD-10-CM | POA: Diagnosis not present

## 2017-12-17 DIAGNOSIS — E039 Hypothyroidism, unspecified: Secondary | ICD-10-CM | POA: Diagnosis not present

## 2017-12-17 DIAGNOSIS — I10 Essential (primary) hypertension: Secondary | ICD-10-CM | POA: Diagnosis not present

## 2017-12-20 ENCOUNTER — Other Ambulatory Visit (HOSPITAL_COMMUNITY): Payer: Self-pay | Admitting: *Deleted

## 2017-12-21 ENCOUNTER — Inpatient Hospital Stay (HOSPITAL_COMMUNITY): Payer: PPO | Attending: Oncology

## 2017-12-21 DIAGNOSIS — E785 Hyperlipidemia, unspecified: Secondary | ICD-10-CM | POA: Diagnosis not present

## 2017-12-21 DIAGNOSIS — I2510 Atherosclerotic heart disease of native coronary artery without angina pectoris: Secondary | ICD-10-CM | POA: Insufficient documentation

## 2017-12-21 DIAGNOSIS — Z794 Long term (current) use of insulin: Secondary | ICD-10-CM | POA: Insufficient documentation

## 2017-12-21 DIAGNOSIS — D693 Immune thrombocytopenic purpura: Secondary | ICD-10-CM | POA: Insufficient documentation

## 2017-12-21 DIAGNOSIS — F329 Major depressive disorder, single episode, unspecified: Secondary | ICD-10-CM | POA: Insufficient documentation

## 2017-12-21 DIAGNOSIS — E039 Hypothyroidism, unspecified: Secondary | ICD-10-CM | POA: Insufficient documentation

## 2017-12-21 DIAGNOSIS — E119 Type 2 diabetes mellitus without complications: Secondary | ICD-10-CM | POA: Insufficient documentation

## 2017-12-21 DIAGNOSIS — D696 Thrombocytopenia, unspecified: Secondary | ICD-10-CM

## 2017-12-21 DIAGNOSIS — R233 Spontaneous ecchymoses: Secondary | ICD-10-CM | POA: Insufficient documentation

## 2017-12-21 LAB — CBC WITH DIFFERENTIAL/PLATELET
Basophils Absolute: 0 10*3/uL (ref 0.0–0.1)
Basophils Relative: 0 %
Eosinophils Absolute: 0 10*3/uL (ref 0.0–0.7)
Eosinophils Relative: 1 %
HCT: 42.2 % (ref 36.0–46.0)
Hemoglobin: 13.8 g/dL (ref 12.0–15.0)
Lymphocytes Relative: 14 %
Lymphs Abs: 1.3 10*3/uL (ref 0.7–4.0)
MCH: 30.3 pg (ref 26.0–34.0)
MCHC: 32.7 g/dL (ref 30.0–36.0)
MCV: 92.7 fL (ref 78.0–100.0)
Monocytes Absolute: 0.2 10*3/uL (ref 0.1–1.0)
Monocytes Relative: 2 %
Neutro Abs: 7.3 10*3/uL (ref 1.7–7.7)
Neutrophils Relative %: 83 %
Platelets: 16 10*3/uL — CL (ref 150–400)
RBC: 4.55 MIL/uL (ref 3.87–5.11)
RDW: 13 % (ref 11.5–15.5)
WBC: 8.9 10*3/uL (ref 4.0–10.5)

## 2017-12-21 LAB — COMPREHENSIVE METABOLIC PANEL
ALT: 42 U/L (ref 14–54)
AST: 36 U/L (ref 15–41)
Albumin: 3.7 g/dL (ref 3.5–5.0)
Alkaline Phosphatase: 66 U/L (ref 38–126)
Anion gap: 8 (ref 5–15)
BUN: 11 mg/dL (ref 6–20)
CO2: 32 mmol/L (ref 22–32)
Calcium: 9 mg/dL (ref 8.9–10.3)
Chloride: 99 mmol/L — ABNORMAL LOW (ref 101–111)
Creatinine, Ser: 0.75 mg/dL (ref 0.44–1.00)
GFR calc Af Amer: >60 mL/min (ref >60)
GFR calc non Af Amer: >60 mL/min (ref >60)
Glucose, Bld: 229 mg/dL — ABNORMAL HIGH (ref 65–99)
Potassium: 4.6 mmol/L (ref 3.5–5.1)
Sodium: 139 mmol/L (ref 135–145)
Total Bilirubin: 0.4 mg/dL (ref 0.3–1.2)
Total Protein: 6.3 g/dL — ABNORMAL LOW (ref 6.5–8.1)

## 2017-12-21 NOTE — Progress Notes (Signed)
CRITICAL VALUE ALERT Critical value received:  Platelets - 16  Date of notification:  12/21/17 Time of notification: 1207 Critical value read back:  Yes.   Nurse who received alert:  M.Jamani Bearce, LPN MD notified (1st page):  Hetty ElyJ. Burns, NP

## 2017-12-22 MED ORDER — OCTREOTIDE ACETATE 30 MG IM KIT
PACK | INTRAMUSCULAR | Status: AC
  Filled 2017-12-22: qty 1

## 2017-12-23 ENCOUNTER — Encounter (HOSPITAL_COMMUNITY): Payer: Self-pay | Admitting: Internal Medicine

## 2017-12-23 ENCOUNTER — Inpatient Hospital Stay (HOSPITAL_BASED_OUTPATIENT_CLINIC_OR_DEPARTMENT_OTHER): Payer: PPO | Admitting: Internal Medicine

## 2017-12-23 ENCOUNTER — Other Ambulatory Visit: Payer: Self-pay

## 2017-12-23 ENCOUNTER — Inpatient Hospital Stay (HOSPITAL_COMMUNITY): Payer: PPO

## 2017-12-23 VITALS — BP 107/46 | HR 75 | Temp 98.5°F | Resp 18 | Ht 62.0 in | Wt 107.0 lb

## 2017-12-23 VITALS — BP 115/41 | HR 74 | Temp 97.5°F | Resp 16

## 2017-12-23 DIAGNOSIS — R233 Spontaneous ecchymoses: Secondary | ICD-10-CM

## 2017-12-23 DIAGNOSIS — E039 Hypothyroidism, unspecified: Secondary | ICD-10-CM | POA: Diagnosis not present

## 2017-12-23 DIAGNOSIS — D693 Immune thrombocytopenic purpura: Secondary | ICD-10-CM | POA: Diagnosis not present

## 2017-12-23 DIAGNOSIS — D696 Thrombocytopenia, unspecified: Secondary | ICD-10-CM

## 2017-12-23 DIAGNOSIS — E119 Type 2 diabetes mellitus without complications: Secondary | ICD-10-CM

## 2017-12-23 DIAGNOSIS — F329 Major depressive disorder, single episode, unspecified: Secondary | ICD-10-CM

## 2017-12-23 DIAGNOSIS — I2510 Atherosclerotic heart disease of native coronary artery without angina pectoris: Secondary | ICD-10-CM | POA: Diagnosis not present

## 2017-12-23 DIAGNOSIS — Z794 Long term (current) use of insulin: Secondary | ICD-10-CM | POA: Diagnosis not present

## 2017-12-23 DIAGNOSIS — E785 Hyperlipidemia, unspecified: Secondary | ICD-10-CM | POA: Diagnosis not present

## 2017-12-23 LAB — CBC WITH DIFFERENTIAL/PLATELET
Basophils Absolute: 0 10*3/uL (ref 0.0–0.1)
Basophils Relative: 0 %
Eosinophils Absolute: 0.1 10*3/uL (ref 0.0–0.7)
Eosinophils Relative: 1 %
HCT: 39.8 % (ref 36.0–46.0)
Hemoglobin: 13.1 g/dL (ref 12.0–15.0)
Lymphocytes Relative: 43 %
Lymphs Abs: 3.7 10*3/uL (ref 0.7–4.0)
MCH: 30.6 pg (ref 26.0–34.0)
MCHC: 32.9 g/dL (ref 30.0–36.0)
MCV: 93 fL (ref 78.0–100.0)
Monocytes Absolute: 0.7 10*3/uL (ref 0.1–1.0)
Monocytes Relative: 8 %
Neutro Abs: 4 10*3/uL (ref 1.7–7.7)
Neutrophils Relative %: 48 %
Platelets: 13 10*3/uL — CL (ref 150–400)
RBC: 4.28 MIL/uL (ref 3.87–5.11)
RDW: 13.1 % (ref 11.5–15.5)
WBC: 8.5 10*3/uL (ref 4.0–10.5)

## 2017-12-23 LAB — SAMPLE TO BLOOD BANK

## 2017-12-23 MED ORDER — IMMUNE GLOBULIN (HUMAN) 20 GM/200ML IV SOLN
1.0000 g/kg | Freq: Once | INTRAVENOUS | Status: AC
  Administered 2017-12-23: 50 g via INTRAVENOUS
  Filled 2017-12-23: qty 100

## 2017-12-23 MED ORDER — DIPHENHYDRAMINE HCL 25 MG PO CAPS
ORAL_CAPSULE | ORAL | Status: AC
  Filled 2017-12-23: qty 2

## 2017-12-23 MED ORDER — DIPHENHYDRAMINE HCL 25 MG PO CAPS
25.0000 mg | ORAL_CAPSULE | Freq: Once | ORAL | Status: AC
  Administered 2017-12-23: 25 mg via ORAL
  Filled 2017-12-23: qty 1

## 2017-12-23 MED ORDER — ACETAMINOPHEN 325 MG PO TABS
650.0000 mg | ORAL_TABLET | Freq: Once | ORAL | Status: AC
  Administered 2017-12-23: 650 mg via ORAL

## 2017-12-23 MED ORDER — DEXTROSE 5 % IV SOLN
INTRAVENOUS | Status: DC
  Administered 2017-12-23: 10:00:00 via INTRAVENOUS

## 2017-12-23 MED ORDER — ACETAMINOPHEN 325 MG PO TABS
ORAL_TABLET | ORAL | Status: AC
  Filled 2017-12-23: qty 2

## 2017-12-23 NOTE — Progress Notes (Signed)
Tolerated infusion w/o adverse reaction.  Alert, in no distress.  VSS.  Discharged ambulatory in c/o family.  

## 2017-12-23 NOTE — Progress Notes (Signed)
CRITICAL VALUE ALERT Critical value received:  Plt 13 Date of notification:  12/23/17 Time of notification: 8:56 Critical value read back:  Yes.   Nurse who received alert:  Deneise Leveriane Wilson RN MD notified (1st page):  Dr. FijiPeru 8:58   Pt to see Dr. FijiPeru in the clinic today.

## 2017-12-23 NOTE — Addendum Note (Signed)
Addended by: Mickie BailWILSON, Sharina Petre G on: 12/23/2017 08:19 AM   Modules accepted: Orders

## 2017-12-24 ENCOUNTER — Encounter (HOSPITAL_COMMUNITY): Payer: Self-pay

## 2017-12-24 ENCOUNTER — Ambulatory Visit (HOSPITAL_COMMUNITY): Payer: PPO | Admitting: Internal Medicine

## 2017-12-24 ENCOUNTER — Inpatient Hospital Stay (HOSPITAL_COMMUNITY): Payer: PPO

## 2017-12-24 VITALS — BP 135/51 | HR 65 | Temp 97.7°F | Resp 16 | Wt 107.2 lb

## 2017-12-24 DIAGNOSIS — D696 Thrombocytopenia, unspecified: Secondary | ICD-10-CM

## 2017-12-24 DIAGNOSIS — D693 Immune thrombocytopenic purpura: Secondary | ICD-10-CM | POA: Diagnosis not present

## 2017-12-24 LAB — CBC WITH DIFFERENTIAL/PLATELET
Basophils Absolute: 0 10*3/uL (ref 0.0–0.1)
Basophils Relative: 1 %
Eosinophils Absolute: 0.1 10*3/uL (ref 0.0–0.7)
Eosinophils Relative: 1 %
HCT: 38 % (ref 36.0–46.0)
Hemoglobin: 12.2 g/dL (ref 12.0–15.0)
Lymphocytes Relative: 39 %
Lymphs Abs: 2.4 10*3/uL (ref 0.7–4.0)
MCH: 30.1 pg (ref 26.0–34.0)
MCHC: 32.1 g/dL (ref 30.0–36.0)
MCV: 93.8 fL (ref 78.0–100.0)
Monocytes Absolute: 0.6 10*3/uL (ref 0.1–1.0)
Monocytes Relative: 9 %
Neutro Abs: 3.1 10*3/uL (ref 1.7–7.7)
Neutrophils Relative %: 50 %
Platelets: 38 10*3/uL — ABNORMAL LOW (ref 150–400)
RBC: 4.05 MIL/uL (ref 3.87–5.11)
RDW: 13 % (ref 11.5–15.5)
WBC: 6.3 10*3/uL (ref 4.0–10.5)

## 2017-12-24 MED ORDER — DIPHENHYDRAMINE HCL 25 MG PO CAPS
25.0000 mg | ORAL_CAPSULE | Freq: Once | ORAL | Status: AC
  Administered 2017-12-24: 25 mg via ORAL
  Filled 2017-12-24: qty 1

## 2017-12-24 MED ORDER — ACETAMINOPHEN 325 MG PO TABS
ORAL_TABLET | ORAL | Status: AC
  Filled 2017-12-24: qty 2

## 2017-12-24 MED ORDER — DIPHENHYDRAMINE HCL 25 MG PO CAPS
ORAL_CAPSULE | ORAL | Status: AC
  Filled 2017-12-24: qty 1

## 2017-12-24 MED ORDER — IMMUNE GLOBULIN (HUMAN) 20 GM/200ML IV SOLN
1.0000 g/kg | Freq: Once | INTRAVENOUS | Status: AC
  Administered 2017-12-24: 50 g via INTRAVENOUS
  Filled 2017-12-24: qty 100

## 2017-12-24 MED ORDER — ACETAMINOPHEN 325 MG PO TABS
650.0000 mg | ORAL_TABLET | Freq: Once | ORAL | Status: AC
  Administered 2017-12-24: 650 mg via ORAL

## 2017-12-24 MED ORDER — DEXTROSE 5 % IV SOLN
INTRAVENOUS | Status: DC
  Administered 2017-12-24: 10:00:00 via INTRAVENOUS

## 2017-12-24 NOTE — Patient Instructions (Signed)
[facility] at [facility] Discharge Instructions  RECOMMENDATIONS MADE BY THE CONSULTANT AND ANY TEST RESULTS WILL BE SENT TO YOUR REFERRING PHYSICIAN.  Received IVIG infusion today. Follow-up as scheduled. Call clinic for any questions or concerns  Thank you for choosing [facility] at [facility] to provide your oncology and hematology care.  To afford each patient quality time with our provider, please arrive at least 15 minutes before your scheduled appointment time.    If you have a lab appointment with the Cancer Center please come in thru the  Main Entrance and check in at the main information desk  You need to re-schedule your appointment should you arrive 10 or more minutes late.  We strive to give you quality time with our providers, and arriving late affects you and other patients whose appointments are after yours.  Also, if you no show three or more times for appointments you may be dismissed from the clinic at the providers discretion.     Again, thank you for choosing [facility].  Our hope is that these requests will decrease the amount of time that you wait before being seen by our physicians.       _____________________________________________________________  Should you have questions after your visit to [facility], please contact our office at [phone] between the hours of 8:30 a.m. and 4:30 p.m.  Voicemails left after 4:30 p.m. will not be returned until the following business day.  For prescription refill requests, have your pharmacy contact our office.       Resources For Cancer Patients and their Caregivers ? American Cancer Society: Can assist with transportation, wigs, general needs, runs Look Good Feel Better.        1-888-227-6333 ? Cancer Care: Provides financial assistance, online support groups, medication/co-pay assistance.  1-800-813-HOPE (4673) ? [facility] Assists [place] cancer patients and their families through emotional , educational and financial support.  336-427-4357 ? [place] DSS Where to apply for food stamps, Medicaid and utility assistance. 336-342-1394 ? RCATS: Transportation to medical appointments. 336-347-2287 ? Social Security Administration: May apply for disability if have a Stage IV cancer. 336-342-7796 1-800-772-1213 ? [place] Aging, Disability and Transit Services: Assists with nutrition, care and transit needs. 336-349-2343  Cancer Center Support Programs: @10RELATIVEDAYS@ > Cancer Support Group  2nd Tuesday of the month 1pm-2pm, Journey Room  > Creative Journey  3rd Tuesday of the month 1130am-1pm, Journey Room  > Look Good Feel Better  1st Wednesday of the month 10am-12 noon, Journey Room (Call American Cancer Society to register 1-800-395-5775)   

## 2017-12-24 NOTE — Progress Notes (Signed)
1000 Labs reviewed with Dr. Hezzie BumpPerumandla with Platelets 38 and pt to received IVIG infusion only today per MD orders                                                      Madeline ChaseShirley S Tenaglia tolerated IVIG infusion well without complaints or incident. VSS upon discharge. Pt discharged self ambulatory in satisfactory condition accompanied by her husband

## 2017-12-27 ENCOUNTER — Inpatient Hospital Stay (HOSPITAL_COMMUNITY): Payer: PPO

## 2017-12-27 DIAGNOSIS — D696 Thrombocytopenia, unspecified: Secondary | ICD-10-CM

## 2017-12-27 DIAGNOSIS — D693 Immune thrombocytopenic purpura: Secondary | ICD-10-CM | POA: Diagnosis not present

## 2017-12-27 LAB — CBC WITH DIFFERENTIAL/PLATELET
Basophils Absolute: 0 10*3/uL (ref 0.0–0.1)
Basophils Relative: 1 %
Eosinophils Absolute: 0.1 10*3/uL (ref 0.0–0.7)
Eosinophils Relative: 1 %
HCT: 39.1 % (ref 36.0–46.0)
Hemoglobin: 12.7 g/dL (ref 12.0–15.0)
Lymphocytes Relative: 46 %
Lymphs Abs: 2.4 10*3/uL (ref 0.7–4.0)
MCH: 30.5 pg (ref 26.0–34.0)
MCHC: 32.5 g/dL (ref 30.0–36.0)
MCV: 94 fL (ref 78.0–100.0)
Monocytes Absolute: 0.4 10*3/uL (ref 0.1–1.0)
Monocytes Relative: 7 %
Neutro Abs: 2.4 10*3/uL (ref 1.7–7.7)
Neutrophils Relative %: 45 %
Platelets: 92 10*3/uL — ABNORMAL LOW (ref 150–400)
RBC: 4.16 MIL/uL (ref 3.87–5.11)
RDW: 13.4 % (ref 11.5–15.5)
WBC: 5.2 10*3/uL (ref 4.0–10.5)

## 2017-12-27 LAB — SAMPLE TO BLOOD BANK

## 2017-12-30 ENCOUNTER — Inpatient Hospital Stay (HOSPITAL_BASED_OUTPATIENT_CLINIC_OR_DEPARTMENT_OTHER): Payer: PPO | Admitting: Internal Medicine

## 2017-12-30 ENCOUNTER — Other Ambulatory Visit (HOSPITAL_COMMUNITY): Payer: Self-pay

## 2017-12-30 ENCOUNTER — Other Ambulatory Visit: Payer: Self-pay

## 2017-12-30 ENCOUNTER — Encounter (HOSPITAL_COMMUNITY): Payer: Self-pay | Admitting: Internal Medicine

## 2017-12-30 ENCOUNTER — Inpatient Hospital Stay (HOSPITAL_COMMUNITY): Payer: PPO

## 2017-12-30 VITALS — BP 157/58 | HR 58 | Temp 98.7°F | Resp 16 | Ht 62.0 in | Wt 106.5 lb

## 2017-12-30 DIAGNOSIS — D693 Immune thrombocytopenic purpura: Secondary | ICD-10-CM

## 2017-12-30 DIAGNOSIS — D696 Thrombocytopenia, unspecified: Secondary | ICD-10-CM

## 2017-12-30 DIAGNOSIS — Z794 Long term (current) use of insulin: Secondary | ICD-10-CM | POA: Diagnosis not present

## 2017-12-30 DIAGNOSIS — I2510 Atherosclerotic heart disease of native coronary artery without angina pectoris: Secondary | ICD-10-CM

## 2017-12-30 DIAGNOSIS — F329 Major depressive disorder, single episode, unspecified: Secondary | ICD-10-CM | POA: Diagnosis not present

## 2017-12-30 DIAGNOSIS — E119 Type 2 diabetes mellitus without complications: Secondary | ICD-10-CM | POA: Diagnosis not present

## 2017-12-30 DIAGNOSIS — E785 Hyperlipidemia, unspecified: Secondary | ICD-10-CM

## 2017-12-30 LAB — CBC WITH DIFFERENTIAL/PLATELET
Basophils Absolute: 0 10*3/uL (ref 0.0–0.1)
Basophils Relative: 1 %
Eosinophils Absolute: 0.1 10*3/uL (ref 0.0–0.7)
Eosinophils Relative: 1 %
HCT: 39.1 % (ref 36.0–46.0)
Hemoglobin: 12.8 g/dL (ref 12.0–15.0)
Lymphocytes Relative: 26 %
Lymphs Abs: 1.6 10*3/uL (ref 0.7–4.0)
MCH: 30.5 pg (ref 26.0–34.0)
MCHC: 32.7 g/dL (ref 30.0–36.0)
MCV: 93.3 fL (ref 78.0–100.0)
Monocytes Absolute: 0.5 10*3/uL (ref 0.1–1.0)
Monocytes Relative: 8 %
Neutro Abs: 4 10*3/uL (ref 1.7–7.7)
Neutrophils Relative %: 64 %
Platelets: 61 10*3/uL — ABNORMAL LOW (ref 150–400)
RBC: 4.19 MIL/uL (ref 3.87–5.11)
RDW: 13.4 % (ref 11.5–15.5)
WBC: 6.3 10*3/uL (ref 4.0–10.5)

## 2017-12-30 LAB — SAMPLE TO BLOOD BANK

## 2017-12-30 NOTE — Patient Instructions (Signed)
[facility] at [facility] Discharge Instructions  RECOMMENDATIONS MADE BY THE CONSULTANT AND ANY TEST RESULTS WILL BE SENT TO YOUR REFERRING PHYSICIAN.  You saw Dr Melton AlarHiggs today Your platelets are 61,000 today We would want to re-check you once you taper down to 1 steroid a day with the doctor. Labs and doctors appt on 01/11/18 as scheduled. Continue your taper on your steroid dose. Do not take any aspirin or anti-inflammatories. Please call the clinic if you have any questions or concerns.   Thank you for choosing [facility] at [facility] to provide your oncology and hematology care.  To afford each patient quality time with our provider, please arrive at least 15 minutes before your scheduled appointment time.    If you have a lab appointment with the Cancer Center please come in thru the  Main Entrance and check in at the main information desk  You need to re-schedule your appointment should you arrive 10 or more minutes late.  We strive to give you quality time with our providers, and arriving late affects you and other patients whose appointments are after yours.  Also, if you no show three or more times for appointments you may be dismissed from the clinic at the providers discretion.     Again, thank you for choosing [facility].  Our hope is that these requests will decrease the amount of time that you wait before being seen by our physicians.       _____________________________________________________________  Should you have questions after your visit to [facility], please contact our office at [phone] between the hours of 8:30 a.m. and 4:30 p.m.  Voicemails left after 4:30 p.m. will not be returned until the following business day.  For prescription refill requests, have your pharmacy contact our office.       Resources For Cancer Patients and their Caregivers ? American Cancer Society: Can assist  with transportation, wigs, general needs, runs Look Good Feel Better.        712-281-29411-(604)705-0147 ? Cancer Care: Provides financial assistance, online support groups, medication/co-pay assistance.  1-800-813-HOPE (437) 223-1506(4673) ? [facility] Assists [place] cancer patients and their families through emotional , educational and financial support.  (463)[phone] ? [place] DSS Where to apply for food stamps, Medicaid and utility assistance. 418 371 78832400195275 ? RCATS: Transportation to medical appointments. (986)746-6012684-032-1100 ? Social Security Administration: May apply for disability if have a Stage IV cancer. [phone] ? [place] A[place], Disability and Transit Services: Assists with nutrition, care and transit needs. [phone]  Cancer Center Support Programs: @10RELATIVEDAYS @ > Cancer Support Group  2nd Tuesday of the month 1pm-2pm, Journey Room  > Creative Journey  3rd Tuesday of the month 1130am-1pm, Journey Room  > Look Good Feel Better  1st Wednesday of the month 10am-12 noon, Journey Room (Call American Cancer Society to register [phone])

## 2017-12-31 NOTE — Progress Notes (Signed)
CHIEF COMPLAINT: Thrombocytopenia dating back to July 2016 with an acute change in platelet count. Abd [place] in 2017 was negative for splenomegaly. Suspect underlying ITP. Bone marrow biopsy on 09/15/2017- was negative for MDS. Normal cytogenetics  HPI: She was seen by Faythe Casa on 12/14/17 when a decline in platelet ct was noted to 26, she started her on Prenisone 50 mg daily for week 1, 40 mg daily for week 2, 30 mg daily for week 3, 20 mg daily for a week 4 and 10 mg daily for week 5.   She has been taking as prescribed, but a repeat Cbc on 12/21/17 showed a further decline in pltlt ct to 16. I was given her critical values yesterday. I had asked the patient to come in this AM to repeat cbc and review treatement plan. she denies any active bleeding, she did notice superficial bruising in extremities, no mucosal bleeding, blood blisters in the mouth etc No headache, no visual problems BS have been in 200 s over the last few days  PMH: Significant for CAD, dyslipidemia, tobacco abuse, depression, DM, insulin dependent, and hypothyroidism  CBC today shows platelet ct at 13, H/H stable  Exam shows small superficial bruises in arms.  Petechiae noted in legs No neuro deficits. AAOX3 HEENT- wnl  Blood pressure (!) 107/46, pulse 75, temperature 98.5 F (36.9 C), temperature source Oral, resp. rate 18, height 5' 2"  (1.575 m), weight 107 lb (48.5 kg), SpO2 99 %.   IMPRESSION AND PLAN: ITP with declining platelets Did not respond to steroids Superficial bruising and petechiae, Hgb- stable Transfuse platelets if less than 10 IVIG begin today- see orders Potential side effects from IVIG were explained, including risk of allergic reactions,  taper off  steroids. Cbc tomorrow. And then twice weekly

## 2018-01-06 ENCOUNTER — Ambulatory Visit (HOSPITAL_COMMUNITY): Payer: PPO

## 2018-01-06 ENCOUNTER — Other Ambulatory Visit (HOSPITAL_COMMUNITY): Payer: PPO

## 2018-01-10 ENCOUNTER — Other Ambulatory Visit (HOSPITAL_COMMUNITY): Payer: Self-pay | Admitting: *Deleted

## 2018-01-10 ENCOUNTER — Other Ambulatory Visit: Payer: Self-pay | Admitting: Internal Medicine

## 2018-01-10 DIAGNOSIS — D696 Thrombocytopenia, unspecified: Secondary | ICD-10-CM

## 2018-01-11 ENCOUNTER — Other Ambulatory Visit: Payer: Self-pay

## 2018-01-11 ENCOUNTER — Inpatient Hospital Stay (HOSPITAL_BASED_OUTPATIENT_CLINIC_OR_DEPARTMENT_OTHER): Payer: PPO | Admitting: Internal Medicine

## 2018-01-11 ENCOUNTER — Inpatient Hospital Stay (HOSPITAL_COMMUNITY): Payer: PPO

## 2018-01-11 ENCOUNTER — Encounter (HOSPITAL_COMMUNITY): Payer: Self-pay | Admitting: Internal Medicine

## 2018-01-11 VITALS — BP 101/45 | HR 71 | Temp 98.7°F | Resp 16 | Ht 62.0 in | Wt 108.4 lb

## 2018-01-11 DIAGNOSIS — D696 Thrombocytopenia, unspecified: Secondary | ICD-10-CM

## 2018-01-11 DIAGNOSIS — E039 Hypothyroidism, unspecified: Secondary | ICD-10-CM

## 2018-01-11 DIAGNOSIS — D693 Immune thrombocytopenic purpura: Secondary | ICD-10-CM | POA: Diagnosis not present

## 2018-01-11 DIAGNOSIS — E119 Type 2 diabetes mellitus without complications: Secondary | ICD-10-CM | POA: Diagnosis not present

## 2018-01-11 LAB — CBC WITH DIFFERENTIAL/PLATELET
Basophils Absolute: 0 10*3/uL (ref 0.0–0.1)
Basophils Relative: 0 %
Eosinophils Absolute: 0 10*3/uL (ref 0.0–0.7)
Eosinophils Relative: 0 %
HCT: 40.9 % (ref 36.0–46.0)
Hemoglobin: 13 g/dL (ref 12.0–15.0)
Lymphocytes Relative: 16 %
Lymphs Abs: 1.5 10*3/uL (ref 0.7–4.0)
MCH: 30.4 pg (ref 26.0–34.0)
MCHC: 31.8 g/dL (ref 30.0–36.0)
MCV: 95.8 fL (ref 78.0–100.0)
Monocytes Absolute: 0.2 10*3/uL (ref 0.1–1.0)
Monocytes Relative: 2 %
Neutro Abs: 7.4 10*3/uL (ref 1.7–7.7)
Neutrophils Relative %: 82 %
Platelets: 34 10*3/uL — ABNORMAL LOW (ref 150–400)
RBC: 4.27 MIL/uL (ref 3.87–5.11)
RDW: 14.2 % (ref 11.5–15.5)
WBC: 9.2 10*3/uL (ref 4.0–10.5)

## 2018-01-11 MED ORDER — DEXAMETHASONE 4 MG PO TABS: 20.0000 mg | ORAL_TABLET | Freq: Two times a day (BID) | ORAL | 0 refills | Status: DC

## 2018-01-11 NOTE — Patient Instructions (Signed)
[facility] at [facility] Discharge Instructions  RECOMMENDATIONS MADE BY THE CONSULTANT AND ANY TEST RESULTS WILL BE SENT TO YOUR REFERRING PHYSICIAN.  You were seen today by Dr. Ahmed PrimaVetta Higgs Your platelets were 34,000 today which is much lower than 2 weeks ago. We are starting you on a new steroid:  Decadron (dexamethasone) 4 mg You are going to take 5 tablets (20 mg ) in the morning and 5 tablets (20 mg ) in the evening Take it with water ( NOT COFFEE) and take it with food.  You are only going to take it for 4 days  We will redraw your blood work next Tuesday to see how your numbers are doing. We will have you follow up in 1 month    Thank you for choosing [facility] at [facility] to provide your oncology and hematology care.  To afford each patient quality time with our provider, please arrive at least 15 minutes before your scheduled appointment time.    If you have a lab appointment with the Cancer Center please come in thru the  Main Entrance and check in at the main information desk  You need to re-schedule your appointment should you arrive 10 or more minutes late.  We strive to give you quality time with our providers, and arriving late affects you and other patients whose appointments are after yours.  Also, if you no show three or more times for appointments you may be dismissed from the clinic at the providers discretion.     Again, thank you for choosing [facility].  Our hope is that these requests will decrease the amount of time that you wait before being seen by our physicians.       _____________________________________________________________  Should you have questions after your visit to [facility], please contact our office at [phone] between the hours of 8:30 a.m. and 4:30 p.m.  Voicemails left after 4:30 p.m. will not be returned until the following business day.  For prescription refill  requests, have your pharmacy contact our office.       Resources For Cancer Patients and their Caregivers ? American Cancer Society: Can assist with transportation, wigs, general needs, runs Look Good Feel Better.        [phone] ? Cancer Care: Provides financial assistance, online support groups, medication/co-pay assistance.  1-800-813-HOPE 424-556-3950(4673) ? [facility] Assists [place] cancer patients and their families through emotional , educational and financial support.  [phone] ? [place] DSS Where to apply for food stamps, Medicaid and utility assistance. 763-620-0769209-214-1508 ? RCATS: Transportation to medical appointments. [phone] ? Social Security Administration: May apply for disability if have a Stage IV cancer. [phone] ? [place] Aging, Disability and Transit Services: Assists with nutrition, care and transit needs. [phone]  Cancer Center Support Programs: @10RELATIVEDAYS @ > Cancer Support Group  2nd Tuesday of the month 1pm-2pm, Journey Room  > Creative Journey  3rd Tuesday of the month 1130am-1pm, Journey Room  > Look Good Feel Better  1st Wednesday of the month 10am-12 noon, Journey Room (Call American Cancer Society to register [phone])

## 2018-01-11 NOTE — Progress Notes (Signed)
Diagnosis ITP ([place]) Staging Cancer Staging No matching staging information was found for the patient.   ASSESSMENT/PLAN: 1.  ITP.  Pt has thrombocytopenia dating back to July 2016 with an acute change in platelet count. Abd [place] in 2017 was negative for splenomegaly.  She underwent bone marrow biopsy on 09/15/2017 with finding that were generally slightly hypercellular for age with trilineage hematopoiesis including abundant megakaryocytes displaying predominantly normal morphology. Significant dyspoietic changes are not seen. In the presence of thrombocytopenia, the findings are consistent with peripheral destruction/consumption or sequestration of platelets.  She is currently on tapering dose of steroids.  Plt count is 61,000.   She will continue with weekly lab check and if counts trend downward despite steroids, she will have option of IVIG.   2.  DM. Continue to followup with PCP.    3.  Hyperthyroidism.  Continue to follow-up with PCP.    Interval History:  Pt followed by Dr. Talbert Cage.  She was diagnosed with ITP.  Bone marrow biopsy done on 09/15/2017 consistent with ITP.     Current Status:  Pt seen today for follow-up.  She is currently on tapering dose of steroids.  Denies any bleeding episodes.     Problem List Patient Active Problem List   Diagnosis Date Noted  . Thrombocytopenia ([place]) [D69.6] 10/28/2016  . CAD (coronary artery disease) [I25.10] 08/14/2011  . Dyslipidemia [E78.5] 08/14/2011  . Tobacco abuse [Z72.0] 08/14/2011    Past Medical History Past Medical History:  Diagnosis Date  . Diabetes mellitus   . Dyslipidemia   . Hyperthyroidism     Past Surgical History Past Surgical History:  Procedure Laterality Date  . BREAST BIOPSY Right 1967   EXCISIONAL - NEG  . no surgical hx      Family History Family History  Problem Relation Age of Onset  . Heart Problems Mother   . Other Father        UNK  . Cancer Sister        BREAST  . Breast cancer  Sister 61  . Cancer Sister        BREAST  . Breast cancer Sister 53  . Cancer Sister        BREAST  . Diabetes Son      Social History  reports that she has been smoking cigarettes.  She has a 50.00 pack-year smoking history. she has never used smokeless tobacco. She reports that she does not drink alcohol or use drugs.  Medications  Current Outpatient Medications:  .  atorvastatin (LIPITOR) 20 MG tablet, TAKE 1 TABLET BY MOUTH ONCE DAILY *PLEASE  KEEP  UPCOMING  APPOINTMENT  FOR  FUTURE  REFILLS*, Disp: 90 tablet, Rfl: 3 .  cetirizine (ZYRTEC) 10 MG tablet, Take by mouth., Disp: , Rfl:  .  citalopram (CELEXA) 20 MG tablet, Take 20 mg by mouth daily.  , Disp: , Rfl:  .  levothyroxine (SYNTHROID, LEVOTHROID) 75 MCG tablet, Take 75 mcg by mouth daily. , Disp: , Rfl:  .  lisinopril (PRINIVIL,ZESTRIL) 10 MG tablet, Take 10 mg by mouth daily. , Disp: , Rfl:  .  Multiple Vitamin (MULTIVITAMIN WITH MINERALS) TABS tablet, Take 1 tablet by mouth daily., Disp: , Rfl:  .  nitroGLYCERIN (NITROSTAT) 0.4 MG SL tablet, Place 0.4 mg under the tongue every 5 (five) minutes as needed.  , Disp: , Rfl:  .  NOVOLOG 100 UNIT/ML injection, , Disp: , Rfl:  .  predniSONE (DELTASONE) 10 MG tablet, Take 1 tablet (  10 mg total) by mouth daily with breakfast. Take 5 tablets (50 mg total) PO for one week. Then take 4 tablets (40 mg total) PO for one week. Then 3 tablets (30 mg total) for one week. Then 2 tablets (20 mg total) for one week. Then 1 tablet (10 mg total) for one week. Total of 5 weeks of steroids., Disp: 105 tablet, Rfl: 0 .  dexamethasone (DECADRON) 4 MG tablet, Take 5 tablets (20 mg total) by mouth 2 (two) times daily., Disp: 40 tablet, Rfl: 0 .  metoprolol tartrate (LOPRESSOR) 25 MG tablet, TAKE 1 TABLET BY MOUTH TWICE DAILY, Disp: 180 tablet, Rfl: 3  Allergies Darvon; Propoxyphene; and Sulfa antibiotics  Review of Systems Review of Systems - Oncology ROS as per HPI otherwise 12 point ROS is  negative.   Physical Exam  Vitals:  T 98.7 HR 58 RR 16 BP 157/58  WT 106 lbs Constitutional: Well-developed, well-nourished, and in no distress.   HENT: Head: Normocephalic and atraumatic.  Mouth/Throat: No oropharyngeal exudate. Mucosa moist. Eyes: Pupils are equal, round, and reactive to light. Conjunctivae are normal. No scleral icterus.  Neck: Normal range of motion. Neck supple. No JVD present.  Cardiovascular: Normal rate, regular rhythm and normal heart sounds.  Exam reveals no gallop and no friction rub.   No murmur heard. Pulmonary/Chest: Effort normal and breath sounds normal. No respiratory distress. No wheezes.No rales.  Abdominal: Soft. Bowel sounds are normal. No distension. There is no tenderness. There is no guarding.  Musculoskeletal: No edema or tenderness.  Lymphadenopathy: No cervical, axillary or supraclavicular adenopathy.  Neurological: Alert and oriented to person, place, and time. No cranial nerve deficit.  Skin: Skin is warm and dry. No rash noted. No erythema. No pallor.  Psychiatric: Affect and judgment normal.   Labs Lab on 12/30/2017  Component Date Value Ref Range Status  . Blood Bank Specimen 12/30/2017 SAMPLE AVAILABLE FOR TESTING   Final  . Sample Expiration 12/30/2017    Final                   Value:01/02/2018 Performed at [facility], [address], [place], [place] [address]   . WBC 12/30/2017 6.3  4.0 - 10.5 K/uL Final  . RBC 12/30/2017 4.19  3.87 - 5.11 MIL/uL Final  . Hemoglobin 12/30/2017 12.8  12.0 - 15.0 g/dL Final  . HCT 12/30/2017 39.1  36.0 - 46.0 % Final  . MCV 12/30/2017 93.3  78.0 - 100.0 fL Final  . MCH 12/30/2017 30.5  26.0 - 34.0 pg Final  . MCHC 12/30/2017 32.7  30.0 - 36.0 g/dL Final  . RDW 12/30/2017 13.4  11.5 - 15.5 % Final  . Platelets 12/30/2017 61* 150 - 400 K/uL Final   Comment: PLATELET COUNT CONFIRMED BY SMEAR SPECIMEN CHECKED FOR CLOTS   . Neutrophils Relative % 12/30/2017 64  % Final  . Neutro Abs 12/30/2017  4.0  1.7 - 7.7 K/uL Final  . Lymphocytes Relative 12/30/2017 26  % Final  . Lymphs Abs 12/30/2017 1.6  0.7 - 4.0 K/uL Final  . Monocytes Relative 12/30/2017 8  % Final  . Monocytes Absolute 12/30/2017 0.5  0.1 - 1.0 K/uL Final  . Eosinophils Relative 12/30/2017 1  % Final  . Eosinophils Absolute 12/30/2017 0.1  0.0 - 0.7 K/uL Final  . Basophils Relative 12/30/2017 1  % Final  . Basophils Absolute 12/30/2017 0.0  0.0 - 0.1 K/uL Final   Performed at [facility], [address],  [place], [place] [address]     Pathology:   Bone Marrow Biopsy 09/15/17: AL DIAGNOSI Bone Marrow, Aspirate,Biopsy, and Clot, right iliac - SLIGHTLY HYPERCELLULAR BONE MARROW FOR AGE WITH TRILINEAGE HEMATOPOIESIS INCLUDING ABUNDANT MEGAKARYOCYTES. - SEVERAL LYMPHOID AGGREGATES PRESENT. - SEE COMMENT. PERIPHERAL BLOOD: - THROMBOCYTOPENIA. Diagnosis Note The bone marrow is generally slightly hypercellular for age with trilineage hematopoiesis including abundant megakaryocytes displaying predominantly normal morphology. Significant dyspoietic changes are not seen. In the presence of thrombocytopenia, the findings are consistent with peripheral destruction/consumption or sequestration of platelets. The bone marrow also shows several small lymphoid aggregates mostly composed of small lymphoid cells. Immunohistochemical stains and flow cytometric studies failed to show any significant T or B-cell phenotypic abnormalities and hence the lymphoid aggregates likely represent a reactive process. Correlation with cytogenetic studies is recommended. (BNS:ecj:ah 11/1-12/2016) Susanne Greenhouse MD Pathologist, Electronic Signature (Case signed 09/17/2017)  Cytogenetics: Normal female karyotype.  Patient: Madeline [name], Madeline [name] Collected: 09/15/2017 Client: [facility] Accession: [id number] Received: 09/15/2017 D. Arne [place], MD DOB: [birth year] Age: 38 Gender: F Reported: 09/17/2017 [place] Patient Ph:  [phone] MRN #: [id number] [place], [place] [address] Visit #: [id number] Chart #: Phone: [phone] Fax: ERPRETATION Interpretation Bone Marrow Flow Cytometry - NO MONOCLONAL B-CELL POPULATION OR ABNORMAL T-CELL PHENOTYPE IDENTIFIED. Susanne Greenhouse MD Pathologist, Electronic Signature     Zoila Shutter MD

## 2018-01-14 DIAGNOSIS — J431 Panlobular emphysema: Secondary | ICD-10-CM | POA: Diagnosis not present

## 2018-01-14 DIAGNOSIS — D696 Thrombocytopenia, unspecified: Secondary | ICD-10-CM | POA: Diagnosis not present

## 2018-01-14 DIAGNOSIS — Z0000 Encounter for general adult medical examination without abnormal findings: Secondary | ICD-10-CM | POA: Diagnosis not present

## 2018-01-14 DIAGNOSIS — Z79899 Other long term (current) drug therapy: Secondary | ICD-10-CM | POA: Diagnosis not present

## 2018-01-14 DIAGNOSIS — E1042 Type 1 diabetes mellitus with diabetic polyneuropathy: Secondary | ICD-10-CM | POA: Diagnosis not present

## 2018-01-14 DIAGNOSIS — I10 Essential (primary) hypertension: Secondary | ICD-10-CM | POA: Diagnosis not present

## 2018-01-14 DIAGNOSIS — R0989 Other specified symptoms and signs involving the circulatory and respiratory systems: Secondary | ICD-10-CM | POA: Diagnosis not present

## 2018-01-14 DIAGNOSIS — E7800 Pure hypercholesterolemia, unspecified: Secondary | ICD-10-CM | POA: Diagnosis not present

## 2018-01-14 DIAGNOSIS — E039 Hypothyroidism, unspecified: Secondary | ICD-10-CM | POA: Diagnosis not present

## 2018-01-18 ENCOUNTER — Inpatient Hospital Stay (HOSPITAL_COMMUNITY): Payer: PPO | Attending: Internal Medicine

## 2018-01-18 DIAGNOSIS — R911 Solitary pulmonary nodule: Secondary | ICD-10-CM | POA: Insufficient documentation

## 2018-01-18 DIAGNOSIS — D696 Thrombocytopenia, unspecified: Secondary | ICD-10-CM

## 2018-01-18 DIAGNOSIS — D693 Immune thrombocytopenic purpura: Secondary | ICD-10-CM | POA: Diagnosis not present

## 2018-01-18 LAB — CBC WITH DIFFERENTIAL/PLATELET
Basophils Absolute: 0 10*3/uL (ref 0.0–0.1)
Basophils Relative: 0 %
Eosinophils Absolute: 0 10*3/uL (ref 0.0–0.7)
Eosinophils Relative: 0 %
HCT: 38.2 % (ref 36.0–46.0)
Hemoglobin: 12.3 g/dL (ref 12.0–15.0)
Lymphocytes Relative: 13 %
Lymphs Abs: 1.5 10*3/uL (ref 0.7–4.0)
MCH: 30.1 pg (ref 26.0–34.0)
MCHC: 32.2 g/dL (ref 30.0–36.0)
MCV: 93.6 fL (ref 78.0–100.0)
Monocytes Absolute: 0.8 10*3/uL (ref 0.1–1.0)
Monocytes Relative: 8 %
Neutro Abs: 8.5 10*3/uL — ABNORMAL HIGH (ref 1.7–7.7)
Neutrophils Relative %: 79 %
Platelets: 37 10*3/uL — ABNORMAL LOW (ref 150–400)
RBC: 4.08 MIL/uL (ref 3.87–5.11)
RDW: 13.8 % (ref 11.5–15.5)
WBC: 10.8 10*3/uL — ABNORMAL HIGH (ref 4.0–10.5)

## 2018-01-18 LAB — COMPREHENSIVE METABOLIC PANEL
ALT: 34 U/L (ref 14–54)
AST: 29 U/L (ref 15–41)
Albumin: 3.2 g/dL — ABNORMAL LOW (ref 3.5–5.0)
Alkaline Phosphatase: 55 U/L (ref 38–126)
Anion gap: 9 (ref 5–15)
BUN: 13 mg/dL (ref 6–20)
CO2: 29 mmol/L (ref 22–32)
Calcium: 8.7 mg/dL — ABNORMAL LOW (ref 8.9–10.3)
Chloride: 97 mmol/L — ABNORMAL LOW (ref 101–111)
Creatinine, Ser: 0.81 mg/dL (ref 0.44–1.00)
GFR calc Af Amer: >60 mL/min (ref >60)
GFR calc non Af Amer: >60 mL/min (ref >60)
Glucose, Bld: 331 mg/dL — ABNORMAL HIGH (ref 65–99)
Potassium: 4.4 mmol/L (ref 3.5–5.1)
Sodium: 135 mmol/L (ref 135–145)
Total Bilirubin: 0.5 mg/dL (ref 0.3–1.2)
Total Protein: 6.2 g/dL — ABNORMAL LOW (ref 6.5–8.1)

## 2018-01-18 LAB — LACTATE DEHYDROGENASE: LDH: 148 U/L (ref 98–192)

## 2018-01-19 ENCOUNTER — Other Ambulatory Visit (HOSPITAL_COMMUNITY): Payer: Self-pay | Admitting: Internal Medicine

## 2018-01-19 DIAGNOSIS — R140 Abdominal distension (gaseous): Secondary | ICD-10-CM

## 2018-01-20 ENCOUNTER — Other Ambulatory Visit (HOSPITAL_COMMUNITY): Payer: Self-pay | Admitting: *Deleted

## 2018-01-20 DIAGNOSIS — D696 Thrombocytopenia, unspecified: Secondary | ICD-10-CM

## 2018-01-21 ENCOUNTER — Telehealth (HOSPITAL_COMMUNITY): Payer: Self-pay

## 2018-01-21 NOTE — Telephone Encounter (Signed)
Patient called regarding message Diane had left her yesterday (3/7). I explained to patient that Dr. Melton AlarHiggs was hoping the increased dose of prednisone and the IVIG would bring her platelets up. However,  they only went from 34 to 37. Dr. Melton AlarHiggs is trying to find out why her platelets are staying low and wants to order a CT scan. Gave patient her new appt dates and times with understanding verbalized.

## 2018-01-24 DIAGNOSIS — R0989 Other specified symptoms and signs involving the circulatory and respiratory systems: Secondary | ICD-10-CM | POA: Diagnosis not present

## 2018-01-24 DIAGNOSIS — I6523 Occlusion and stenosis of bilateral carotid arteries: Secondary | ICD-10-CM | POA: Diagnosis not present

## 2018-01-24 NOTE — Progress Notes (Signed)
Diagnosis Thrombocytopenia ([place]) - Plan: CBC with Differential/Platelet, Comprehensive metabolic panel, Lactate dehydrogenase  Staging Cancer Staging No matching staging information was found for the patient.  Assessment and Plan:  1.  ITP.  Pt has thrombocytopenia dating back to July 2016 with an acute change in platelet count. Abd [place] in 2017 was negative for splenomegaly.  She underwent bone marrow biopsy on 09/15/2017 with finding that were generally slightly hypercellular for age with trilineage hematopoiesis including abundant megakaryocytes displaying predominantly normal morphology. Significant dyspoietic changes are not seen. In the presence of thrombocytopenia, the findings are consistent with peripheral destruction/consumption or sequestration of platelets.  She was on prednisone with improvement in Plt count is 61,000.   Recent labs done on 01/11/2018 show PLT count of 34,000.  She will be given a trial of pulse decadron 40 daily for 4 days ans will have labs done in 1 week.  If no improvement will repeat CT scan as CT done 2010 showed question of mass for interval followup.  Will discuss IVIG if CT unrevealing.   2.  DM. Continue to followup with PCP.    3.  Hyperthyroidism.  Continue to follow-up with PCP.    Interval History:  Pt followed by Dr. Talbert Cage.  She was diagnosed with ITP.  Bone marrow biopsy done on 09/15/2017 consistent with ITP.     Current Status:  Pt seen today for follow-up.  She is here to go over lab studies   She denies any bleeding episodes.     Problem List Patient Active Problem List   Diagnosis Date Noted  . Thrombocytopenia ([place]) [D69.6] 10/28/2016  . CAD (coronary artery disease) [I25.10] 08/14/2011  . Dyslipidemia [E78.5] 08/14/2011  . Tobacco abuse [Z72.0] 08/14/2011    Past Medical History Past Medical History:  Diagnosis Date  . Diabetes mellitus   . Dyslipidemia   . Hyperthyroidism     Past Surgical History Past Surgical History:   Procedure Laterality Date  . BREAST BIOPSY Right 1967   EXCISIONAL - NEG  . no surgical hx      Family History Family History  Problem Relation Age of Onset  . Heart Problems Mother   . Other Father        UNK  . Cancer Sister        BREAST  . Breast cancer Sister 3  . Cancer Sister        BREAST  . Breast cancer Sister 70  . Cancer Sister        BREAST  . Diabetes Son      Social History  reports that she has been smoking cigarettes.  She has a 50.00 pack-year smoking history. she has never used smokeless tobacco. She reports that she does not drink alcohol or use drugs.  Medications  Current Outpatient Medications:  .  atorvastatin (LIPITOR) 20 MG tablet, TAKE 1 TABLET BY MOUTH ONCE DAILY *PLEASE  KEEP  UPCOMING  APPOINTMENT  FOR  FUTURE  REFILLS*, Disp: 90 tablet, Rfl: 3 .  cetirizine (ZYRTEC) 10 MG tablet, Take by mouth., Disp: , Rfl:  .  citalopram (CELEXA) 20 MG tablet, Take 20 mg by mouth daily.  , Disp: , Rfl:  .  dexamethasone (DECADRON) 4 MG tablet, Take 5 tablets (20 mg total) by mouth 2 (two) times daily., Disp: 40 tablet, Rfl: 0 .  levothyroxine (SYNTHROID, LEVOTHROID) 75 MCG tablet, Take 75 mcg by mouth daily. , Disp: , Rfl:  .  lisinopril (PRINIVIL,ZESTRIL) 10 MG tablet, Take  10 mg by mouth daily. , Disp: , Rfl:  .  metoprolol tartrate (LOPRESSOR) 25 MG tablet, TAKE 1 TABLET BY MOUTH TWICE DAILY, Disp: 180 tablet, Rfl: 3 .  Multiple Vitamin (MULTIVITAMIN WITH MINERALS) TABS tablet, Take 1 tablet by mouth daily., Disp: , Rfl:  .  nitroGLYCERIN (NITROSTAT) 0.4 MG SL tablet, Place 0.4 mg under the tongue every 5 (five) minutes as needed.  , Disp: , Rfl:  .  NOVOLOG 100 UNIT/ML injection, , Disp: , Rfl:  .  predniSONE (DELTASONE) 10 MG tablet, Take 1 tablet (10 mg total) by mouth daily with breakfast. Take 5 tablets (50 mg total) PO for one week. Then take 4 tablets (40 mg total) PO for one week. Then 3 tablets (30 mg total) for one week. Then 2 tablets (20 mg  total) for one week. Then 1 tablet (10 mg total) for one week. Total of 5 weeks of steroids., Disp: 105 tablet, Rfl: 0  Allergies Darvon; Propoxyphene; and Sulfa antibiotics  Review of Systems Review of Systems - Oncology ROS as per HPI otherwise 12 point ROS is negative.   Physical Exam  Vitals Wt Readings from Last 3 Encounters:  01/11/18 108 lb 6.4 oz (49.2 kg)  12/30/17 106 lb 8 oz (48.3 kg)  12/24/17 107 lb 3.2 oz (48.6 kg)   Temp Readings from Last 3 Encounters:  01/11/18 98.7 F (37.1 C) (Oral)  12/30/17 98.7 F (37.1 C) (Oral)  12/24/17 97.7 F (36.5 C) (Oral)   BP Readings from Last 3 Encounters:  01/11/18 (!) 101/45  12/30/17 (!) 157/58  12/24/17 (!) 135/51   Pulse Readings from Last 3 Encounters:  01/11/18 71  12/30/17 (!) 58  12/24/17 65   Constitutional: Well-developed, well-nourished, and in no distress.   HENT: Head: Normocephalic and atraumatic.  Mouth/Throat: No oropharyngeal exudate. Mucosa moist. Eyes: Pupils are equal, round, and reactive to light. Conjunctivae are normal. No scleral icterus.  Neck: Normal range of motion. Neck supple. No JVD present.  Cardiovascular: Normal rate, regular rhythm and normal heart sounds.  Exam reveals no gallop and no friction rub.   No murmur heard. Pulmonary/Chest: Effort normal and breath sounds normal. No respiratory distress. No wheezes.No rales.  Abdominal: Soft. Bowel sounds are normal. No distension. There is no tenderness. There is no guarding.  Musculoskeletal: No edema or tenderness.  Lymphadenopathy: No cervical, axillary  or supraclavicular adenopathy.  Neurological: Alert and oriented to person, place, and time. No cranial nerve deficit.  Skin: Skin is warm and dry. No rash noted. No erythema. No pallor.  Psychiatric: Affect and judgment normal.   Labs Appointment on 01/11/2018  Component Date Value Ref Range Status  . WBC 01/11/2018 9.2  4.0 - 10.5 K/uL Final  . RBC 01/11/2018 4.27  3.87 - 5.11  MIL/uL Final  . Hemoglobin 01/11/2018 13.0  12.0 - 15.0 g/dL Final  . HCT 01/11/2018 40.9  36.0 - 46.0 % Final  . MCV 01/11/2018 95.8  78.0 - 100.0 fL Final  . MCH 01/11/2018 30.4  26.0 - 34.0 pg Final  . MCHC 01/11/2018 31.8  30.0 - 36.0 g/dL Final  . RDW 01/11/2018 14.2  11.5 - 15.5 % Final  . Platelets 01/11/2018 34* 150 - 400 K/uL Final   Comment: PLATELET COUNT CONFIRMED BY SMEAR SPECIMEN CHECKED FOR CLOTS GIANT PLATELETS SEEN   . Neutrophils Relative % 01/11/2018 82  % Final  . Neutro Abs 01/11/2018 7.4  1.7 - 7.7 K/uL Final  . Lymphocytes Relative 01/11/2018  16  % Final  . Lymphs Abs 01/11/2018 1.5  0.7 - 4.0 K/uL Final  . Monocytes Relative 01/11/2018 2  % Final  . Monocytes Absolute 01/11/2018 0.2  0.1 - 1.0 K/uL Final  . Eosinophils Relative 01/11/2018 0  % Final  . Eosinophils Absolute 01/11/2018 0.0  0.0 - 0.7 K/uL Final  . Basophils Relative 01/11/2018 0  % Final  . Basophils Absolute 01/11/2018 0.0  0.0 - 0.1 K/uL Final   Performed at [facility], [address] [place], [place] [address]     Pathology Orders Placed This Encounter  Procedures  . CBC with Differential/Platelet    Standing Status:   Future    Number of Occurrences:   1    Standing Expiration Date:   01/11/2019  . Comprehensive metabolic panel    Standing Status:   Future    Number of Occurrences:   1    Standing Expiration Date:   01/11/2019  . Lactate dehydrogenase    Standing Status:   Future    Number of Occurrences:   1    Standing Expiration Date:   01/11/2019       Zoila Shutter MD

## 2018-01-25 DIAGNOSIS — E038 Other specified hypothyroidism: Secondary | ICD-10-CM | POA: Diagnosis not present

## 2018-01-25 DIAGNOSIS — E1065 Type 1 diabetes mellitus with hyperglycemia: Secondary | ICD-10-CM | POA: Diagnosis not present

## 2018-01-25 DIAGNOSIS — Z9641 Presence of insulin pump (external) (internal): Secondary | ICD-10-CM | POA: Diagnosis not present

## 2018-01-25 DIAGNOSIS — E1043 Type 1 diabetes mellitus with diabetic autonomic (poly)neuropathy: Secondary | ICD-10-CM | POA: Diagnosis not present

## 2018-01-25 DIAGNOSIS — E063 Autoimmune thyroiditis: Secondary | ICD-10-CM | POA: Diagnosis not present

## 2018-01-25 DIAGNOSIS — F17200 Nicotine dependence, unspecified, uncomplicated: Secondary | ICD-10-CM | POA: Diagnosis not present

## 2018-01-28 ENCOUNTER — Inpatient Hospital Stay (HOSPITAL_COMMUNITY): Payer: PPO

## 2018-01-28 DIAGNOSIS — D693 Immune thrombocytopenic purpura: Secondary | ICD-10-CM | POA: Diagnosis not present

## 2018-01-28 DIAGNOSIS — D696 Thrombocytopenia, unspecified: Secondary | ICD-10-CM

## 2018-01-28 LAB — COMPREHENSIVE METABOLIC PANEL
ALT: 27 U/L (ref 14–54)
AST: 31 U/L (ref 15–41)
Albumin: 3.1 g/dL — ABNORMAL LOW (ref 3.5–5.0)
Alkaline Phosphatase: 63 U/L (ref 38–126)
Anion gap: 10 (ref 5–15)
BUN: 7 mg/dL (ref 6–20)
CO2: 27 mmol/L (ref 22–32)
Calcium: 8.7 mg/dL — ABNORMAL LOW (ref 8.9–10.3)
Chloride: 100 mmol/L — ABNORMAL LOW (ref 101–111)
Creatinine, Ser: 0.74 mg/dL (ref 0.44–1.00)
GFR calc Af Amer: >60 mL/min (ref >60)
GFR calc non Af Amer: >60 mL/min (ref >60)
Glucose, Bld: 224 mg/dL — ABNORMAL HIGH (ref 65–99)
Potassium: 4.4 mmol/L (ref 3.5–5.1)
Sodium: 137 mmol/L (ref 135–145)
Total Bilirubin: 0.8 mg/dL (ref 0.3–1.2)
Total Protein: 5.9 g/dL — ABNORMAL LOW (ref 6.5–8.1)

## 2018-01-28 LAB — CBC WITH DIFFERENTIAL/PLATELET
Basophils Absolute: 0 10*3/uL (ref 0.0–0.1)
Basophils Relative: 0 %
Eosinophils Absolute: 0 10*3/uL (ref 0.0–0.7)
Eosinophils Relative: 1 %
HCT: 38.1 % (ref 36.0–46.0)
Hemoglobin: 12.1 g/dL (ref 12.0–15.0)
Lymphocytes Relative: 26 %
Lymphs Abs: 1.7 10*3/uL (ref 0.7–4.0)
MCH: 30.4 pg (ref 26.0–34.0)
MCHC: 31.8 g/dL (ref 30.0–36.0)
MCV: 95.7 fL (ref 78.0–100.0)
Monocytes Absolute: 0.5 10*3/uL (ref 0.1–1.0)
Monocytes Relative: 7 %
Neutro Abs: 4.2 10*3/uL (ref 1.7–7.7)
Neutrophils Relative %: 66 %
Platelets: 61 10*3/uL — ABNORMAL LOW (ref 150–400)
RBC: 3.98 MIL/uL (ref 3.87–5.11)
RDW: 14 % (ref 11.5–15.5)
WBC: 6.4 10*3/uL (ref 4.0–10.5)

## 2018-02-02 ENCOUNTER — Ambulatory Visit (HOSPITAL_COMMUNITY)
Admission: RE | Admit: 2018-02-02 | Discharge: 2018-02-02 | Disposition: A | Discharge status: Home / Self Care | Payer: PPO | Source: Ambulatory Visit | Attending: Internal Medicine | Admitting: Internal Medicine

## 2018-02-02 DIAGNOSIS — I700 Atherosclerosis of aorta: Secondary | ICD-10-CM | POA: Insufficient documentation

## 2018-02-02 DIAGNOSIS — K4090 Unilateral inguinal hernia, without obstruction or gangrene, not specified as recurrent: Secondary | ICD-10-CM | POA: Diagnosis not present

## 2018-02-02 DIAGNOSIS — R911 Solitary pulmonary nodule: Secondary | ICD-10-CM | POA: Insufficient documentation

## 2018-02-02 DIAGNOSIS — R140 Abdominal distension (gaseous): Secondary | ICD-10-CM | POA: Diagnosis not present

## 2018-02-02 MED ORDER — IOPAMIDOL (ISOVUE-300) INJECTION 61%
100.0000 mL | Freq: Once | INTRAVENOUS | Status: AC | PRN
  Administered 2018-02-02: 100 mL via INTRAVENOUS

## 2018-02-04 ENCOUNTER — Inpatient Hospital Stay (HOSPITAL_BASED_OUTPATIENT_CLINIC_OR_DEPARTMENT_OTHER): Payer: PPO | Admitting: Hematology

## 2018-02-04 ENCOUNTER — Other Ambulatory Visit: Payer: Self-pay

## 2018-02-04 ENCOUNTER — Encounter (HOSPITAL_COMMUNITY): Payer: Self-pay | Admitting: Hematology

## 2018-02-04 DIAGNOSIS — D693 Immune thrombocytopenic purpura: Secondary | ICD-10-CM | POA: Diagnosis not present

## 2018-02-04 DIAGNOSIS — R911 Solitary pulmonary nodule: Secondary | ICD-10-CM | POA: Diagnosis not present

## 2018-02-04 NOTE — Patient Instructions (Signed)
[facility] at [facility] Discharge Instructions  Today you saw Dr. K.   Thank you for choosing [facility] at [facility] to provide your oncology and hematology care.  To afford each patient quality time with our provider, please arrive at least 15 minutes before your scheduled appointment time.   If you have a lab appointment with the Cancer Center please come in thru the  Main Entrance and check in at the main information desk  You need to re-schedule your appointment should you arrive 10 or more minutes late.  We strive to give you quality time with our providers, and arriving late affects you and other patients whose appointments are after yours.  Also, if you no show three or more times for appointments you may be dismissed from the clinic at the providers discretion.     Again, thank you for choosing [facility].  Our hope is that these requests will decrease the amount of time that you wait before being seen by our physicians.       _____________________________________________________________  Should you have questions after your visit to [facility], please contact our office at [phone] between the hours of 8:30 a.m. and 4:30 p.m.  Voicemails left after 4:30 p.m. will not be returned until the following business day.  For prescription refill requests, have your pharmacy contact our office.       Resources For Cancer Patients and their Caregivers ? American Cancer Society: Can assist with transportation, wigs, general needs, runs Look Good Feel Better.        1-888-227-6333 ? Cancer Care: Provides financial assistance, online support groups, medication/co-pay assistance.  1-800-813-HOPE (4673) ? [facility] Assists [place] cancer patients and their families through emotional , educational and financial support.  336-427-4357 ? [place] DSS Where to apply for food  stamps, Medicaid and utility assistance. 336-342-1394 ? RCATS: Transportation to medical appointments. 336-347-2287 ? Social Security Administration: May apply for disability if have a Stage IV cancer. [phone] 1-800-772-1213 ? [place] Aging, Disability and Transit Services: Assists with nutrition, care and transit needs. [phone]  Cancer Center Support Programs:   > Cancer Support Group  2nd Tuesday of the month 1pm-2pm, Journey Room   > Creative Journey  3rd Tuesday of the month 1130am-1pm, Journey Room    

## 2018-02-04 NOTE — Assessment & Plan Note (Signed)
1.  Chronic ITP with exacerbations: Patient had thrombocytopenia since July 2016.  CT abdomen on 02/02/2018 showed normal-sized spleen.  Bone marrow aspiration and biopsy in 09/15/2017 showed hypercellular marrow for age, trilineage hematopoiesis with abundant megakaryocytes with no dysplasia. - Prednisone tapering dose over 5 weeks given on 12/14/2017 with no improvement in platelet count - IVIG (1 g/kg) on 12/23/2017 and 12/24/2017 with peak platelet count of 92, response lasting about 3 weeks -Dexamethasone 40 mg for 4 days on 01/11/2018 with improvement in platelet count of 61. - We will follow her up in 3-4 weeks to review her CBC.  She was told to come back to our office or go to the ER should she develop any spontaneous bruising or bleeding.  Will consider weekly rituximab x4 if there is another drop in platelet count.  2.  Right middle lobe lung nodule: A CT scan of the chest on 02/02/2018 showed right middle lobe nodule which was benign.  Noncontrast enhanced CT scan was recommended in 6-12 months.

## 2018-02-04 NOTE — Progress Notes (Signed)
Patient Care Team: Marguarite ArbourSparks, Jeffrey D, MD as PCP - General (Unknown Physician Specialty)  DIAGNOSIS:  Encounter Diagnosis  Name Primary?  . Chronic ITP (idiopathic thrombocytopenia) (HCC)     CHIEF COMPLIANT: Follow-up of low platelet count.  INTERVAL HISTORY: Madeline [name] is a 84 year old pleasant female who is seen today for follow-up of her low platelet count.  She was recently seen by Dr. Melton AlarHiggs and was given dexamethasone 40 mg for 4 days on 01/11/2018 for a platelet count of 34.  She denies any nosebleeds, hematuria or bleeding per rectum.  She has some bruising on the upper extremities.  No other bruising was reported.  She tolerated dexamethasone very well.  She denies any headaches, fevers or infections.  REVIEW OF SYSTEMS:   Constitutional: Denies fevers, chills or abnormal weight loss Eyes: Denies blurriness of vision Ears, nose, mouth, throat, and face: Denies mucositis or sore throat Respiratory: Denies cough, dyspnea or wheezes Cardiovascular: Denies palpitation, chest discomfort Gastrointestinal:  Denies nausea, heartburn or change in bowel habits Skin: Denies abnormal skin rashes Lymphatics: Denies new lymphadenopathy or easy bruising Neurological:Denies numbness, tingling or new weaknesses Behavioral/Psych: Mood is stable, no new changes  Extremities: No lower extremity edema All other systems were reviewed with the patient and are negative.  I have reviewed the past medical history, past surgical history, social history and family history with the patient and they are unchanged from previous note.  ALLERGIES:  is allergic to darvon; propoxyphene; and sulfa antibiotics.  MEDICATIONS:  Current Outpatient Medications  Medication Sig Dispense Refill  . atorvastatin (LIPITOR) 20 MG tablet TAKE 1 TABLET BY MOUTH ONCE DAILY *PLEASE  KEEP  UPCOMING  APPOINTMENT  FOR  FUTURE  REFILLS* 90 tablet 3  . cetirizine (ZYRTEC) 10 MG tablet Take by mouth.    . citalopram  (CELEXA) 20 MG tablet Take 20 mg by mouth daily.      Marland Kitchen. levothyroxine (SYNTHROID, LEVOTHROID) 75 MCG tablet Take 75 mcg by mouth daily.     Marland Kitchen. lisinopril (PRINIVIL,ZESTRIL) 10 MG tablet Take 10 mg by mouth daily.     . metoprolol tartrate (LOPRESSOR) 25 MG tablet TAKE 1 TABLET BY MOUTH TWICE DAILY 180 tablet 3  . Multiple Vitamin (MULTIVITAMIN WITH MINERALS) TABS tablet Take 1 tablet by mouth daily.    . nitroGLYCERIN (NITROSTAT) 0.4 MG SL tablet Place 0.4 mg under the tongue every 5 (five) minutes as needed.      Marland Kitchen. NOVOLOG 100 UNIT/ML injection      No current facility-administered medications for this visit.     PHYSICAL EXAMINATION: ECOG PERFORMANCE STATUS: 1 - Symptomatic but completely ambulatory  Vitals:   02/04/18 1006  BP: (!) 141/50  Pulse: 68  Resp: 20  Temp: 98.2 F (36.8 C)  SpO2: 100%   Filed Weights   02/04/18 1006  Weight: 109 lb (49.4 kg)    GENERAL:alert, no distress and comfortable SKIN: Mild ecchymosis on the forearms present. EYES: normal, Conjunctiva are pink and non-injected, sclera clear OROPHARYNX:no mucositis, no erythema and lips, buccal mucosa, and tongue normal  NECK: supple, thyroid normal size, non-tender, without nodularity LYMPH:  no palpable lymphadenopathy in the cervical, axillary or inguinal LUNGS: clear to auscultation and percussion with normal breathing effort HEART: regular rate & rhythm and no murmurs and no lower extremity edema ABDOMEN:abdomen soft, non-tender and normal bowel sounds MUSCULOSKELETAL:no cyanosis of digits and no clubbing   EXTREMITIES: No lower extremity edema   LABORATORY DATA:  I have reviewed the  data as listed CMP Latest Ref Rng & Units 01/28/2018 01/18/2018 12/21/2017  Glucose 65 - 99 mg/dL 161(W) 960(A) 540(J)  BUN 6 - 20 mg/dL 7 13 11   Creatinine 0.44 - 1.00 mg/dL 8.11 9.14 7.82  Sodium 135 - 145 mmol/L 137 135 139  Potassium 3.5 - 5.1 mmol/L 4.4 4.4 4.6  Chloride 101 - 111 mmol/L 100(L) 97(L) 99(L)  CO2 22  - 32 mmol/L 27 29 32  Calcium 8.9 - 10.3 mg/dL 9.5(A) 2.1(H) 9.0  Total Protein 6.5 - 8.1 g/dL 5.9(L) 6.2(L) 6.3(L)  Total Bilirubin 0.3 - 1.2 mg/dL 0.8 0.5 0.4  Alkaline Phos 38 - 126 U/L 63 55 66  AST 15 - 41 U/L 31 29 36  ALT 14 - 54 U/L 27 34 42   No results found for: [id number]   Lab Results  Component Value Date   WBC 6.4 01/28/2018   HGB 12.1 01/28/2018   HCT 38.1 01/28/2018   MCV 95.7 01/28/2018   PLT 61 (L) 01/28/2018   NEUTROABS 4.2 01/28/2018    ASSESSMENT & PLAN:  Chronic ITP (idiopathic thrombocytopenia) (HCC) 1.  Chronic ITP with exacerbations: Patient had thrombocytopenia since July 2016.  CT abdomen on 02/02/2018 showed normal-sized spleen.  Bone marrow aspiration and biopsy in 09/15/2017 showed hypercellular marrow for age, trilineage hematopoiesis with abundant megakaryocytes with no dysplasia. - Prednisone tapering dose over 5 weeks given on 12/14/2017 with no improvement in platelet count - IVIG (1 g/kg) on 12/23/2017 and 12/24/2017 with peak platelet count of 92, response lasting about 3 weeks -Dexamethasone 40 mg for 4 days on 01/11/2018 with improvement in platelet count of 61. - We will follow her up in 3-4 weeks to review her CBC.  She was told to come back to our office or go to the ER should she develop any spontaneous bruising or bleeding.  Will consider weekly rituximab x4 if there is another drop in platelet count.  2.  Right middle lobe lung nodule: A CT scan of the chest on 02/02/2018 showed right middle lobe nodule which was benign.  Noncontrast enhanced CT scan was recommended in 6-12 months.     I spent 25 minutes talking to the patient of which more than half was spent in counseling and coordination of care.  The patient has a good understanding of the overall plan. she agrees with it. she will call with any problems that may develop before the next visit here.   Doreatha Massed, MD 02/04/18

## 2018-02-07 DIAGNOSIS — R319 Hematuria, unspecified: Secondary | ICD-10-CM | POA: Diagnosis not present

## 2018-02-07 DIAGNOSIS — R399 Unspecified symptoms and signs involving the genitourinary system: Secondary | ICD-10-CM | POA: Diagnosis not present

## 2018-02-07 DIAGNOSIS — N390 Urinary tract infection, site not specified: Secondary | ICD-10-CM | POA: Diagnosis not present

## 2018-02-08 ENCOUNTER — Ambulatory Visit (HOSPITAL_COMMUNITY): Payer: PPO | Admitting: Internal Medicine

## 2018-02-28 DIAGNOSIS — E038 Other specified hypothyroidism: Secondary | ICD-10-CM | POA: Diagnosis not present

## 2018-02-28 DIAGNOSIS — E1065 Type 1 diabetes mellitus with hyperglycemia: Secondary | ICD-10-CM | POA: Diagnosis not present

## 2018-02-28 DIAGNOSIS — E063 Autoimmune thyroiditis: Secondary | ICD-10-CM | POA: Diagnosis not present

## 2018-02-28 DIAGNOSIS — Z9641 Presence of insulin pump (external) (internal): Secondary | ICD-10-CM | POA: Diagnosis not present

## 2018-02-28 DIAGNOSIS — F17200 Nicotine dependence, unspecified, uncomplicated: Secondary | ICD-10-CM | POA: Diagnosis not present

## 2018-02-28 DIAGNOSIS — E1043 Type 1 diabetes mellitus with diabetic autonomic (poly)neuropathy: Secondary | ICD-10-CM | POA: Diagnosis not present

## 2018-03-01 ENCOUNTER — Other Ambulatory Visit (HOSPITAL_COMMUNITY): Payer: Self-pay | Admitting: *Deleted

## 2018-03-01 DIAGNOSIS — D696 Thrombocytopenia, unspecified: Secondary | ICD-10-CM

## 2018-03-02 ENCOUNTER — Inpatient Hospital Stay (HOSPITAL_COMMUNITY): Payer: PPO

## 2018-03-02 ENCOUNTER — Encounter (HOSPITAL_COMMUNITY): Payer: Self-pay | Admitting: Hematology

## 2018-03-02 ENCOUNTER — Inpatient Hospital Stay (HOSPITAL_COMMUNITY): Payer: PPO | Attending: Hematology | Admitting: Hematology

## 2018-03-02 VITALS — BP 143/56 | HR 69 | Temp 98.3°F | Resp 18 | Wt 109.4 lb

## 2018-03-02 DIAGNOSIS — R911 Solitary pulmonary nodule: Secondary | ICD-10-CM | POA: Diagnosis not present

## 2018-03-02 DIAGNOSIS — D693 Immune thrombocytopenic purpura: Secondary | ICD-10-CM | POA: Diagnosis not present

## 2018-03-02 DIAGNOSIS — D696 Thrombocytopenia, unspecified: Secondary | ICD-10-CM

## 2018-03-02 LAB — CBC WITH DIFFERENTIAL/PLATELET
Basophils Absolute: 0 10*3/uL (ref 0.0–0.1)
Basophils Relative: 1 %
Eosinophils Absolute: 0.2 10*3/uL (ref 0.0–0.7)
Eosinophils Relative: 3 %
HCT: 38.7 % (ref 36.0–46.0)
Hemoglobin: 12.3 g/dL (ref 12.0–15.0)
Lymphocytes Relative: 36 %
Lymphs Abs: 2 10*3/uL (ref 0.7–4.0)
MCH: 30.6 pg (ref 26.0–34.0)
MCHC: 31.8 g/dL (ref 30.0–36.0)
MCV: 96.3 fL (ref 78.0–100.0)
Monocytes Absolute: 0.5 10*3/uL (ref 0.1–1.0)
Monocytes Relative: 8 %
Neutro Abs: 3 10*3/uL (ref 1.7–7.7)
Neutrophils Relative %: 52 %
Platelets: 86 10*3/uL — ABNORMAL LOW (ref 150–400)
RBC: 4.02 MIL/uL (ref 3.87–5.11)
RDW: 13.2 % (ref 11.5–15.5)
WBC: 5.7 10*3/uL (ref 4.0–10.5)

## 2018-03-02 NOTE — Progress Notes (Signed)
[facility] [address], [place] [address]   CLINIC:  Medical Oncology/Hematology  PCP:  Marguarite Arbour, MD [address] [facility] [place] [address] [phone]   REASON FOR VISIT:  Follow-up for immune thrombus cytopenia.  CURRENT THERAPY: Observation.  BRIEF ONCOLOGIC HISTORY:   No history exists.     CANCER STAGING: Cancer Staging No matching staging information was found for the patient.   INTERVAL HISTORY:  Madeline [name] 84 y.o. female returns for routine follow-up of low platelet count.  She denies any easy bruising or bleeding.  She denies any fevers, night sweats or weight loss in the last 1 month.  No infections or hospitalizations or ER visits reported.  No headaches or vision changes reported.  She is accompanied by her sister today.   REVIEW OF SYSTEMS:  Review of Systems  Constitutional: Positive for fatigue.  All other systems reviewed and are negative.    PAST MEDICAL/SURGICAL HISTORY:  Past Medical History:  Diagnosis Date  . Diabetes mellitus   . Dyslipidemia   . Hyperthyroidism    Past Surgical History:  Procedure Laterality Date  . BREAST BIOPSY Right 1967   EXCISIONAL - NEG  . no surgical hx       SOCIAL HISTORY:  Social History   Socioeconomic History  . Marital status: Married    Spouse name: Not on file  . Number of children: Not on file  . Years of education: Not on file  . Highest education level: Not on file  Occupational History  . Not on file  Social Needs  . Financial resource strain: Not on file  . Food insecurity:    Worry: Not on file    Inability: Not on file  . Transportation needs:    Medical: Not on file    Non-medical: Not on file  Tobacco Use  . Smoking status: Current Every Day Smoker    Packs/day: 1.00    Years: 50.00    Pack years: 50.00    Types: Cigarettes  . Smokeless tobacco: Never Used  Substance and Sexual Activity  . Alcohol use: No  . Drug  use: No  . Sexual activity: Not on file  Lifestyle  . Physical activity:    Days per week: Not on file    Minutes per session: Not on file  . Stress: Not on file  Relationships  . Social connections:    Talks on phone: Not on file    Gets together: Not on file    Attends religious service: Not on file    Active member of club or organization: Not on file    Attends meetings of clubs or organizations: Not on file    Relationship status: Not on file  . Intimate partner violence:    Fear of current or ex partner: Not on file    Emotionally abused: Not on file    Physically abused: Not on file    Forced sexual activity: Not on file  Other Topics Concern  . Not on file  Social History Narrative  . Not on file    FAMILY HISTORY:  Family History  Problem Relation Age of Onset  . Heart Problems Mother   . Other Father        UNK  . Cancer Sister        BREAST  . Breast cancer Sister 48  . Cancer Sister        BREAST  .  Breast cancer Sister 18  . Cancer Sister        BREAST  . Diabetes Son     CURRENT MEDICATIONS:  Outpatient Encounter Medications as of 03/02/2018  Medication Sig Note  . atorvastatin (LIPITOR) 20 MG tablet TAKE 1 TABLET BY MOUTH ONCE DAILY *PLEASE  KEEP  UPCOMING  APPOINTMENT  FOR  FUTURE  REFILLS*   . cetirizine (ZYRTEC) 10 MG tablet Take by mouth.   . citalopram (CELEXA) 20 MG tablet Take 20 mg by mouth daily.     Marland Kitchen levothyroxine (SYNTHROID, LEVOTHROID) 75 MCG tablet Take 75 mcg by mouth daily.    Marland Kitchen lisinopril (PRINIVIL,ZESTRIL) 10 MG tablet Take 10 mg by mouth daily.    . metoprolol tartrate (LOPRESSOR) 25 MG tablet TAKE 1 TABLET BY MOUTH TWICE DAILY   . Multiple Vitamin (MULTIVITAMIN WITH MINERALS) TABS tablet Take 1 tablet by mouth daily.   . nitroGLYCERIN (NITROSTAT) 0.4 MG SL tablet Place 0.4 mg under the tongue every 5 (five) minutes as needed.   09/15/2017: Never   . NOVOLOG 100 UNIT/ML injection     No facility-administered encounter  medications on file as of 03/02/2018.     ALLERGIES:  Allergies  Allergen Reactions  . Darvon     "FEEL SICK"  . Propoxyphene Nausea Only and Nausea And Vomiting  . Sulfa Antibiotics Nausea Only and Nausea And Vomiting    "FEEL SICK"      PHYSICAL EXAM:  ECOG Performance status: 1  Vitals:   03/02/18 1340  BP: (!) 143/56  Pulse: 69  Resp: 18  Temp: 98.3 F (36.8 C)  SpO2: 98%   Filed Weights   03/02/18 1340  Weight: 109 lb 6.4 oz (49.6 kg)    Physical Exam Examination of her oral cavity did not reveal any ecchymosis.  LABORATORY DATA:  I have reviewed the labs as listed.  CBC    Component Value Date/Time   WBC 5.7 03/02/2018 1303   RBC 4.02 03/02/2018 1303   HGB 12.3 03/02/2018 1303   HGB 11.5 08/13/2017 1232   HCT 38.7 03/02/2018 1303   HCT 34.9 08/13/2017 1232   PLT 86 (L) 03/02/2018 1303   PLT 41 (LL) 08/13/2017 1232   MCV 96.3 03/02/2018 1303   MCV 90 08/13/2017 1232   MCH 30.6 03/02/2018 1303   MCHC 31.8 03/02/2018 1303   RDW 13.2 03/02/2018 1303   RDW 13.1 08/13/2017 1232   LYMPHSABS 2.0 03/02/2018 1303   MONOABS 0.5 03/02/2018 1303   EOSABS 0.2 03/02/2018 1303   BASOSABS 0.0 03/02/2018 1303   CMP Latest Ref Rng & Units 01/28/2018 01/18/2018 12/21/2017  Glucose 65 - 99 mg/dL 732(K) 025(K) 270(W)  BUN 6 - 20 mg/dL 7 13 11   Creatinine 0.44 - 1.00 mg/dL 2.37 6.28 3.15  Sodium 135 - 145 mmol/L 137 135 139  Potassium 3.5 - 5.1 mmol/L 4.4 4.4 4.6  Chloride 101 - 111 mmol/L 100(L) 97(L) 99(L)  CO2 22 - 32 mmol/L 27 29 32  Calcium 8.9 - 10.3 mg/dL 1.7(O) 1.6(W) 9.0  Total Protein 6.5 - 8.1 g/dL 5.9(L) 6.2(L) 6.3(L)  Total Bilirubin 0.3 - 1.2 mg/dL 0.8 0.5 0.4  Alkaline Phos 38 - 126 U/L 63 55 66  AST 15 - 41 U/L 31 29 36  ALT 14 - 54 U/L 27 34 42      ASSESSMENT & PLAN:   Chronic ITP (idiopathic thrombocytopenia) (HCC) 1.  Chronic ITP with exacerbations: Patient had thrombocytopenia since July 2016.  CT  abdomen on 02/02/2018 showed normal-sized  spleen.  Bone marrow aspiration and biopsy in 09/15/2017 showed hypercellular marrow for age, trilineage hematopoiesis with abundant megakaryocytes with no dysplasia. - Prednisone tapering dose over 5 weeks given on 12/14/2017 with no improvement in platelet count - IVIG (1 g/kg) on 12/23/2017 and 12/24/2017 with peak platelet count of 92, response lasting about 3 weeks -Dexamethasone 40 mg for 4 days on 01/11/2018 with improvement in platelet count of 61. -Today her platelet count improved to 86,000.  She does not have any bleeding symptoms.  We will see her back in 2 months for follow-up with repeat platelet count.  She was told to come back sooner should she develop any easy bruising or bleeding.  We will consider rituximab if there is another exacerbation.  2.  Right middle lobe lung nodule: A CT scan of the chest on 02/02/2018 showed right middle lobe nodule which was benign.  Noncontrast enhanced CT scan was recommended in 6-12 months.      Orders placed this encounter:  Orders Placed This Encounter  Procedures  . CBC      Doreatha MassedSreedhar Alexsia Klindt, MD [facility] [phone]

## 2018-03-02 NOTE — Assessment & Plan Note (Signed)
1.  Chronic ITP with exacerbations: Patient had thrombocytopenia since July 2016.  CT abdomen on 02/02/2018 showed normal-sized spleen.  Bone marrow aspiration and biopsy in 09/15/2017 showed hypercellular marrow for age, trilineage hematopoiesis with abundant megakaryocytes with no dysplasia. - Prednisone tapering dose over 5 weeks given on 12/14/2017 with no improvement in platelet count - IVIG (1 g/kg) on 12/23/2017 and 12/24/2017 with peak platelet count of 92, response lasting about 3 weeks -Dexamethasone 40 mg for 4 days on 01/11/2018 with improvement in platelet count of 61. -Today her platelet count improved to 86,000.  She does not have any bleeding symptoms.  We will see her back in 2 months for follow-up with repeat platelet count.  She was told to come back sooner should she develop any easy bruising or bleeding.  We will consider rituximab if there is another exacerbation.  2.  Right middle lobe lung nodule: A CT scan of the chest on 02/02/2018 showed right middle lobe nodule which was benign.  Noncontrast enhanced CT scan was recommended in 6-12 months.

## 2018-03-02 NOTE — Patient Instructions (Signed)
[facility]  Discharge Instructions:  You were seen by Dr. Katragadda today.   _______________________________________________________________  Thank you for choosing [facility] at [facility] to provide your oncology and hematology care.  To afford each patient quality time with our providers, please arrive at least 15 minutes before your scheduled appointment.  You need to re-schedule your appointment if you arrive 10 or more minutes late.  We strive to give you quality time with our providers, and arriving late affects you and other patients whose appointments are after yours.  Also, if you no show three or more times for appointments you may be dismissed from the clinic.  Again, thank you for choosing  [facility]. Our hope is that these requests will allow you access to exceptional care and in a timely manner. _______________________________________________________________  If you have questions after your visit, please contact our office at [phone] between the hours of 8:30 a.m. and 5:00 p.m. Voicemails left after 4:30 p.m. will not be returned until the following business day. _______________________________________________________________  For prescription refill requests, have your pharmacy contact our office. _______________________________________________________________  Recommendations made by the consultant and any test results will be sent to your referring physician. _______________________________________________________________ 

## 2018-04-12 DIAGNOSIS — E039 Hypothyroidism, unspecified: Secondary | ICD-10-CM | POA: Diagnosis not present

## 2018-04-12 DIAGNOSIS — E1042 Type 1 diabetes mellitus with diabetic polyneuropathy: Secondary | ICD-10-CM | POA: Diagnosis not present

## 2018-04-12 DIAGNOSIS — Z79899 Other long term (current) drug therapy: Secondary | ICD-10-CM | POA: Diagnosis not present

## 2018-04-19 DIAGNOSIS — E1042 Type 1 diabetes mellitus with diabetic polyneuropathy: Secondary | ICD-10-CM | POA: Diagnosis not present

## 2018-04-19 DIAGNOSIS — E039 Hypothyroidism, unspecified: Secondary | ICD-10-CM | POA: Diagnosis not present

## 2018-04-19 DIAGNOSIS — I10 Essential (primary) hypertension: Secondary | ICD-10-CM | POA: Diagnosis not present

## 2018-04-19 DIAGNOSIS — D696 Thrombocytopenia, unspecified: Secondary | ICD-10-CM | POA: Diagnosis not present

## 2018-04-19 DIAGNOSIS — E7800 Pure hypercholesterolemia, unspecified: Secondary | ICD-10-CM | POA: Diagnosis not present

## 2018-04-19 DIAGNOSIS — Z79899 Other long term (current) drug therapy: Secondary | ICD-10-CM | POA: Diagnosis not present

## 2018-05-02 ENCOUNTER — Inpatient Hospital Stay (HOSPITAL_COMMUNITY): Payer: PPO | Attending: Hematology | Admitting: Internal Medicine

## 2018-05-02 ENCOUNTER — Inpatient Hospital Stay (HOSPITAL_COMMUNITY): Payer: PPO

## 2018-05-02 ENCOUNTER — Encounter (HOSPITAL_COMMUNITY): Payer: Self-pay | Admitting: Internal Medicine

## 2018-05-02 VITALS — BP 121/41 | HR 76 | Temp 98.2°F | Resp 14 | Wt 110.0 lb

## 2018-05-02 DIAGNOSIS — E039 Hypothyroidism, unspecified: Secondary | ICD-10-CM | POA: Insufficient documentation

## 2018-05-02 DIAGNOSIS — E119 Type 2 diabetes mellitus without complications: Secondary | ICD-10-CM | POA: Diagnosis not present

## 2018-05-02 DIAGNOSIS — D693 Immune thrombocytopenic purpura: Secondary | ICD-10-CM | POA: Diagnosis not present

## 2018-05-02 DIAGNOSIS — D696 Thrombocytopenia, unspecified: Secondary | ICD-10-CM

## 2018-05-02 DIAGNOSIS — E785 Hyperlipidemia, unspecified: Secondary | ICD-10-CM | POA: Diagnosis not present

## 2018-05-02 LAB — CBC
HCT: 37.7 % (ref 36.0–46.0)
Hemoglobin: 12.3 g/dL (ref 12.0–15.0)
MCH: 30.5 pg (ref 26.0–34.0)
MCHC: 32.6 g/dL (ref 30.0–36.0)
MCV: 93.5 fL (ref 78.0–100.0)
Platelets: 49 10*3/uL — ABNORMAL LOW (ref 150–400)
RBC: 4.03 MIL/uL (ref 3.87–5.11)
RDW: 12.5 % (ref 11.5–15.5)
WBC: 4.6 10*3/uL (ref 4.0–10.5)

## 2018-05-02 NOTE — Progress Notes (Signed)
Diagnosis Chronic ITP (idiopathic thrombocytopenia) (HCC) - Plan: CBC with Differential/Platelet, Hepatitis B surface antibody, Hepatitis B surface antigen, Hepatitis B core antibody, total, Hepatitis panel, acute, CBC with Differential/Platelet, Comprehensive metabolic panel, Lactate dehydrogenase  Staging Cancer Staging No matching staging information was found for the patient.  Assessment and Plan:  1.  ITP.  Pt has thrombocytopenia dating back to July 2016 with an acute change in platelet count. Abd [place] in 2017 was negative for splenomegaly.  She underwent bone marrow biopsy on 09/15/2017 with finding that were generally slightly hypercellular for age with trilineage hematopoiesis including abundant megakaryocytes displaying predominantly normal morphology. Significant dyspoietic changes are not seen. In the presence of thrombocytopenia, the findings are consistent with peripheral destruction/consumption or sequestration of platelets.    - Prednisone tapering dose over 5 weeks given on 12/14/2017 with no improvement in platelet count - IVIG (1 g/kg) on 12/23/2017 and 12/24/2017 with peak platelet count of 92, response lasting about 3 weeks -Dexamethasone 40 mg for 4 days on 01/11/2018 with improvement in platelet count of 61.  Her recent visit on 03/02/2018 showed plt count of 86,000.    Labs done 05/02/2018 reviewed with pt and showed WBC 4.6 HB 12.3 plts 49,000.  I have discussed with her that plt count is trending downward.  She will be given the option of Rituxan if on RTC in 1 week plts  have decreased.  Side effects of Rituxan reviewed with pt.  She is advised to notify the office if she has any bleeding or bruising prior to repeat labs in 1 week.    2.  Right middle lobe lung nodule: A CT scan of the chest on 02/02/2018 showed right middle lobe nodule which was benign.  Noncontrast CT chest will be done in 01/2019.    3.  DM. Follow-up with PCP for monitoring.  Would prefer not to continue  steroids due to risk of exacerbation of DM.    4.  Hyperthyroidism.  Follow-up with PCP.    Interval History:  84 yr old female diagnosed with ITP.  Bone marrow biopsy done on 09/15/2017 consistent with ITP.     Current Status:  Pt seen today for follow-up.  She is here to go over lab studies   She denies any bleeding episodes.    Problem List Patient Active Problem List   Diagnosis Date Noted  . Chronic ITP (idiopathic thrombocytopenia) (HCC) [D69.3] 02/04/2018  . Thrombocytopenia ([place]) [D69.6] 10/28/2016  . CAD (coronary artery disease) [I25.10] 08/14/2011  . Dyslipidemia [E78.5] 08/14/2011  . Tobacco abuse [Z72.0] 08/14/2011    Past Medical History Past Medical History:  Diagnosis Date  . Diabetes mellitus   . Dyslipidemia   . Hyperthyroidism     Past Surgical History Past Surgical History:  Procedure Laterality Date  . BREAST BIOPSY Right 1967   EXCISIONAL - NEG  . no surgical hx      Family History Family History  Problem Relation Age of Onset  . Heart Problems Mother   . Other Father        UNK  . Cancer Sister        BREAST  . Breast cancer Sister 33  . Cancer Sister        BREAST  . Breast cancer Sister 72  . Cancer Sister        BREAST  . Diabetes Son      Social History  reports that she has been smoking cigarettes.  She has a 50.00  pack-year smoking history. She has never used smokeless tobacco. She reports that she does not drink alcohol or use drugs.  Medications  Current Outpatient Medications:  .  atorvastatin (LIPITOR) 20 MG tablet, TAKE 1 TABLET BY MOUTH ONCE DAILY *PLEASE  KEEP  UPCOMING  APPOINTMENT  FOR  FUTURE  REFILLS*, Disp: 90 tablet, Rfl: 3 .  cetirizine (ZYRTEC) 10 MG tablet, Take by mouth., Disp: , Rfl:  .  citalopram (CELEXA) 20 MG tablet, Take 20 mg by mouth daily.  , Disp: , Rfl:  .  levothyroxine (SYNTHROID, LEVOTHROID) 75 MCG tablet, Take 75 mcg by mouth daily. , Disp: , Rfl:  .  lisinopril (PRINIVIL,ZESTRIL) 10 MG tablet,  Take 10 mg by mouth daily. , Disp: , Rfl:  .  metoprolol tartrate (LOPRESSOR) 25 MG tablet, TAKE 1 TABLET BY MOUTH TWICE DAILY, Disp: 180 tablet, Rfl: 3 .  Multiple Vitamin (MULTIVITAMIN WITH MINERALS) TABS tablet, Take 1 tablet by mouth daily., Disp: , Rfl:  .  nitroGLYCERIN (NITROSTAT) 0.4 MG SL tablet, Place 0.4 mg under the tongue every 5 (five) minutes as needed.  , Disp: , Rfl:  .  NOVOLOG 100 UNIT/ML injection, , Disp: , Rfl:   Allergies Darvon; Propoxyphene; and Sulfa antibiotics  Review of Systems Review of Systems - Oncology ROS as per HPI negative other than bruising.    Physical Exam  Vitals Wt Readings from Last 3 Encounters:  05/02/18 110 lb (49.9 kg)  03/02/18 109 lb 6.4 oz (49.6 kg)  02/04/18 109 lb (49.4 kg)   Temp Readings from Last 3 Encounters:  05/02/18 98.2 F (36.8 C) (Oral)  03/02/18 98.3 F (36.8 C) (Oral)  02/04/18 98.2 F (36.8 C) (Oral)   BP Readings from Last 3 Encounters:  05/02/18 (!) 121/41  03/02/18 (!) 143/56  02/04/18 (!) 141/50   Pulse Readings from Last 3 Encounters:  05/02/18 76  03/02/18 69  02/04/18 68   Constitutional: Well-developed, well-nourished, and in no distress.   HENT: Head: Normocephalic and atraumatic.  Mouth/Throat: No oropharyngeal exudate. Mucosa moist. Eyes: Pupils are equal, round, and reactive to light. Conjunctivae are normal. No scleral icterus.  Neck: Normal range of motion. Neck supple. No JVD present.  Cardiovascular: Normal rate, regular rhythm and normal heart sounds.  Exam reveals no gallop and no friction rub.   No murmur heard. Pulmonary/Chest: Effort normal and breath sounds normal. No respiratory distress. No wheezes.No rales.  Abdominal: Soft. Bowel sounds are normal. No distension. There is no tenderness. There is no guarding.  Musculoskeletal: No edema or tenderness.  Lymphadenopathy: No cervical, axillary or supraclavicular adenopathy.  Neurological: Alert and oriented to person, place, and  time. No cranial nerve deficit.  Skin: Skin is warm and dry. Minor bruising noted.   Psychiatric: Affect and judgment normal.   Labs Appointment on 05/02/2018  Component Date Value Ref Range Status  . WBC 05/02/2018 4.6  4.0 - 10.5 K/uL Final  . RBC 05/02/2018 4.03  3.87 - 5.11 MIL/uL Final  . Hemoglobin 05/02/2018 12.3  12.0 - 15.0 g/dL Final  . HCT 05/02/2018 37.7  36.0 - 46.0 % Final  . MCV 05/02/2018 93.5  78.0 - 100.0 fL Final  . MCH 05/02/2018 30.5  26.0 - 34.0 pg Final  . MCHC 05/02/2018 32.6  30.0 - 36.0 g/dL Final  . RDW 05/02/2018 12.5  11.5 - 15.5 % Final  . Platelets 05/02/2018 49* 150 - 400 K/uL Final   Comment: SPECIMEN CHECKED FOR CLOTS PLATELET COUNT CONFIRMED BY  SMEAR Performed at [facility], [address], [place], [place] [address]      Pathology Orders Placed This Encounter  Procedures  . CBC with Differential/Platelet    Standing Status:   Future    Standing Expiration Date:   05/03/2019  . Hepatitis B surface antibody    Standing Status:   Future    Standing Expiration Date:   05/03/2019  . Hepatitis B surface antigen    Standing Status:   Future    Standing Expiration Date:   05/03/2019  . Hepatitis B core antibody, total    Standing Status:   Future    Standing Expiration Date:   05/03/2019  . Hepatitis panel, acute    Standing Status:   Future    Standing Expiration Date:   05/03/2019  . CBC with Differential/Platelet    Standing Status:   Future    Standing Expiration Date:   05/03/2019  . Comprehensive metabolic panel    Standing Status:   Future    Standing Expiration Date:   05/03/2019  . Lactate dehydrogenase    Standing Status:   Future    Standing Expiration Date:   05/03/2019       Zoila Shutter MD

## 2018-05-02 NOTE — Patient Instructions (Signed)
[facility] at [facility] Discharge Instructions  You saw Dr. Higgs today.   Thank you for choosing [facility] at [facility] to provide your oncology and hematology care.  To afford each patient quality time with our provider, please arrive at least 15 minutes before your scheduled appointment time.   If you have a lab appointment with the Cancer Center please come in thru the  Main Entrance and check in at the main information desk  You need to re-schedule your appointment should you arrive 10 or more minutes late.  We strive to give you quality time with our providers, and arriving late affects you and other patients whose appointments are after yours.  Also, if you no show three or more times for appointments you may be dismissed from the clinic at the providers discretion.     Again, thank you for choosing [facility].  Our hope is that these requests will decrease the amount of time that you wait before being seen by our physicians.       _____________________________________________________________  Should you have questions after your visit to [facility], please contact our office at [phone] between the hours of 8:30 a.m. and 4:30 p.m.  Voicemails left after 4:30 p.m. will not be returned until the following business day.  For prescription refill requests, have your pharmacy contact our office.       Resources For Cancer Patients and their Caregivers ? American Cancer Society: Can assist with transportation, wigs, general needs, runs Look Good Feel Better.        1-888-227-6333 ? Cancer Care: Provides financial assistance, online support groups, medication/co-pay assistance.  1-800-813-HOPE (4673) ? [facility] Assists [place] cancer patients and their families through emotional , educational and financial support.  336-427-4357 ? [place] DSS Where to apply for food  stamps, Medicaid and utility assistance. 336-342-1394 ? [facility]: Transportation to medical appointments. 336-347-2287 ? Social Security Administration: May apply for disability if have a Stage IV cancer. 336-342-7796 1-800-772-1213 ? [place] Aging, Disability and Transit Services: Assists with nutrition, care and transit needs. 336-349-2343  Cancer Center Support Programs:   > Cancer Support Group  2nd Tuesday of the month 1pm-2pm, Journey Room   > Creative Journey  3rd Tuesday of the month 1130am-1pm, Journey Room     

## 2018-05-09 ENCOUNTER — Inpatient Hospital Stay (HOSPITAL_COMMUNITY): Payer: PPO

## 2018-05-09 DIAGNOSIS — D693 Immune thrombocytopenic purpura: Secondary | ICD-10-CM | POA: Diagnosis not present

## 2018-05-09 LAB — CBC WITH DIFFERENTIAL/PLATELET
Basophils Absolute: 0 10*3/uL (ref 0.0–0.1)
Basophils Relative: 0 %
Eosinophils Absolute: 0.1 10*3/uL (ref 0.0–0.7)
Eosinophils Relative: 3 %
HCT: 38.6 % (ref 36.0–46.0)
Hemoglobin: 12.3 g/dL (ref 12.0–15.0)
Lymphocytes Relative: 48 %
Lymphs Abs: 2.2 10*3/uL (ref 0.7–4.0)
MCH: 29.9 pg (ref 26.0–34.0)
MCHC: 31.9 g/dL (ref 30.0–36.0)
MCV: 93.9 fL (ref 78.0–100.0)
Monocytes Absolute: 0.3 10*3/uL (ref 0.1–1.0)
Monocytes Relative: 7 %
Neutro Abs: 1.9 10*3/uL (ref 1.7–7.7)
Neutrophils Relative %: 42 %
Platelets: 79 10*3/uL — ABNORMAL LOW (ref 150–400)
RBC: 4.11 MIL/uL (ref 3.87–5.11)
RDW: 12.7 % (ref 11.5–15.5)
WBC: 4.6 10*3/uL (ref 4.0–10.5)

## 2018-05-10 LAB — HEPATITIS PANEL, ACUTE
HCV Ab: <0.1 s/co ratio (ref 0.0–0.9)
Hep A IgM: NEGATIVE
Hep B C IgM: NEGATIVE
Hepatitis B Surface Ag: NEGATIVE

## 2018-05-10 LAB — HEPATITIS B CORE ANTIBODY, TOTAL: Hep B Core Total Ab: NEGATIVE

## 2018-05-10 LAB — HEPATITIS B SURFACE ANTIGEN: Hepatitis B Surface Ag: NEGATIVE

## 2018-05-10 LAB — HEPATITIS B SURFACE ANTIBODY,QUALITATIVE: Hep B S Ab: REACTIVE

## 2018-05-12 ENCOUNTER — Telehealth (HOSPITAL_COMMUNITY): Payer: Self-pay | Admitting: *Deleted

## 2018-05-12 NOTE — Telephone Encounter (Signed)
Pt aware that labs are stable per Dr. Ellin SabaKAtragadda.

## 2018-05-23 ENCOUNTER — Other Ambulatory Visit (HOSPITAL_COMMUNITY): Payer: PPO

## 2018-06-08 DIAGNOSIS — E1043 Type 1 diabetes mellitus with diabetic autonomic (poly)neuropathy: Secondary | ICD-10-CM | POA: Diagnosis not present

## 2018-06-08 DIAGNOSIS — E063 Autoimmune thyroiditis: Secondary | ICD-10-CM | POA: Diagnosis not present

## 2018-06-08 DIAGNOSIS — F17200 Nicotine dependence, unspecified, uncomplicated: Secondary | ICD-10-CM | POA: Diagnosis not present

## 2018-06-08 DIAGNOSIS — E1065 Type 1 diabetes mellitus with hyperglycemia: Secondary | ICD-10-CM | POA: Diagnosis not present

## 2018-06-08 DIAGNOSIS — E038 Other specified hypothyroidism: Secondary | ICD-10-CM | POA: Diagnosis not present

## 2018-06-08 DIAGNOSIS — Z9641 Presence of insulin pump (external) (internal): Secondary | ICD-10-CM | POA: Diagnosis not present

## 2018-06-13 ENCOUNTER — Inpatient Hospital Stay (HOSPITAL_COMMUNITY): Payer: PPO | Attending: Hematology

## 2018-06-13 ENCOUNTER — Encounter (HOSPITAL_COMMUNITY): Payer: Self-pay | Admitting: Internal Medicine

## 2018-06-13 ENCOUNTER — Inpatient Hospital Stay (HOSPITAL_COMMUNITY): Payer: PPO | Attending: Internal Medicine | Admitting: Internal Medicine

## 2018-06-13 ENCOUNTER — Other Ambulatory Visit: Payer: Self-pay

## 2018-06-13 VITALS — BP 161/59 | HR 61 | Temp 97.8°F | Resp 20 | Wt 107.4 lb

## 2018-06-13 DIAGNOSIS — D693 Immune thrombocytopenic purpura: Secondary | ICD-10-CM | POA: Diagnosis not present

## 2018-06-13 LAB — COMPREHENSIVE METABOLIC PANEL
ALT: 21 U/L (ref 0–44)
AST: 28 U/L (ref 15–41)
Albumin: 3.5 g/dL (ref 3.5–5.0)
Alkaline Phosphatase: 81 U/L (ref 38–126)
Anion gap: 5 (ref 5–15)
BUN: 8 mg/dL (ref 8–23)
CO2: 31 mmol/L (ref 22–32)
Calcium: 8.5 mg/dL — ABNORMAL LOW (ref 8.9–10.3)
Chloride: 102 mmol/L (ref 98–111)
Creatinine, Ser: 0.72 mg/dL (ref 0.44–1.00)
GFR calc Af Amer: >60 mL/min (ref >60)
GFR calc non Af Amer: >60 mL/min (ref >60)
Glucose, Bld: 242 mg/dL — ABNORMAL HIGH (ref 70–99)
Potassium: 4.5 mmol/L (ref 3.5–5.1)
Sodium: 138 mmol/L (ref 135–145)
Total Bilirubin: 0.6 mg/dL (ref 0.3–1.2)
Total Protein: 6 g/dL — ABNORMAL LOW (ref 6.5–8.1)

## 2018-06-13 LAB — CBC WITH DIFFERENTIAL/PLATELET
Basophils Absolute: 0 10*3/uL (ref 0.0–0.1)
Basophils Relative: 0 %
Eosinophils Absolute: 0.1 10*3/uL (ref 0.0–0.7)
Eosinophils Relative: 2 %
HCT: 37.1 % (ref 36.0–46.0)
Hemoglobin: 12.1 g/dL (ref 12.0–15.0)
Lymphocytes Relative: 40 %
Lymphs Abs: 2 10*3/uL (ref 0.7–4.0)
MCH: 30 pg (ref 26.0–34.0)
MCHC: 32.6 g/dL (ref 30.0–36.0)
MCV: 91.8 fL (ref 78.0–100.0)
Monocytes Absolute: 0.3 10*3/uL (ref 0.1–1.0)
Monocytes Relative: 6 %
Neutro Abs: 2.6 10*3/uL (ref 1.7–7.7)
Neutrophils Relative %: 52 %
Platelets: 72 10*3/uL — ABNORMAL LOW (ref 150–400)
RBC: 4.04 MIL/uL (ref 3.87–5.11)
RDW: 12.7 % (ref 11.5–15.5)
WBC: 5 10*3/uL (ref 4.0–10.5)

## 2018-06-13 LAB — LACTATE DEHYDROGENASE: LDH: 163 U/L (ref 98–192)

## 2018-06-13 NOTE — Patient Instructions (Signed)
[facility] at [facility] Discharge Instructions  Today you saw Dr. Higgs   Thank you for choosing  [facility] Center at [facility] to provide your oncology and hematology care.  To afford each patient quality time with our provider, please arrive at least 15 minutes before your scheduled appointment time.   If you have a lab appointment with the Cancer Center please come in thru the  Main Entrance and check in at the main information desk  You need to re-schedule your appointment should you arrive 10 or more minutes late.  We strive to give you quality time with our providers, and arriving late affects you and other patients whose appointments are after yours.  Also, if you no show three or more times for appointments you may be dismissed from the clinic at the providers discretion.     Again, thank you for choosing [facility].  Our hope is that these requests will decrease the amount of time that you wait before being seen by our physicians.       _____________________________________________________________  Should you have questions after your visit to [facility], please contact our office at [phone] between the hours of 8:00 a.m. and 4:30 p.m.  Voicemails left after 4:00 p.m. will not be returned until the following business day.  For prescription refill requests, have your pharmacy contact our office and allow 72 hours.    Cancer Center Support Programs:   > Cancer Support Group  2nd Tuesday of the month 1pm-2pm, Journey Room   

## 2018-06-13 NOTE — Progress Notes (Signed)
Diagnosis Chronic ITP (idiopathic thrombocytopenia) (HCC) - Plan: CBC with Differential/Platelet, Comprehensive metabolic panel, Lactate dehydrogenase, CBC with Differential/Platelet, Comprehensive metabolic panel, Lactate dehydrogenase  Staging Cancer Staging No matching staging information was found for the patient.  Assessment and Plan:  1.  ITP.  Pt has thrombocytopenia dating back to July 2016 with an acute change in platelet count. Abd [place] in 2017 was negative for splenomegaly.  She underwent bone marrow biopsy on 09/15/2017 with finding that were generally slightly hypercellular for age with trilineage hematopoiesis including abundant megakaryocytes displaying predominantly normal morphology. Significant dyspoietic changes are not seen. In the presence of thrombocytopenia, the findings are consistent with peripheral destruction/consumption or sequestration of platelets.    - Prednisone tapering dose over 5 weeks given on 12/14/2017 with no improvement in platelet count - IVIG (1 g/kg) on 12/23/2017 and 12/24/2017 with peak platelet count of 92, response lasting about 3 weeks -Dexamethasone 40 mg for 4 days on 01/11/2018 with improvement in platelet count of 61.  Labs done 05/02/2018 showed WBC 4.6 HB 12.3 plts 49,000.   Pt is here today for follow-up.  Labs done 06/13/2018 reviewed with pt and showed WBC 5, HB 12.1 plts 72,000.  Plt count is improved.  Chemistries WNL with Cr 0.72 and normal LFTs.    She will RTC in 07/2018 for repeat labs.  She will be seen for follow-up in 09/2018.  I have previously discussed options of therapy with Rituxan vs TPO receptor agonist.  She should notify the office if any bleeding or bruising prior to next labs evaluation.   2.  Right middle lobe lung nodule: ACT scan of the chest on 02/02/2018 showed right middle lobe nodule which was benign.  Noncontrast CT chest will be done in 01/2019.    3.  DM. Follow-up with PCP for monitoring.  Would prefer not to use  steroids due to risk of exacerbation of DM.    4.  Hyperthyroidism.  Follow-up with PCP.    Interval History:  84 yr old female diagnosed with ITP.  Bone marrow biopsy done on 09/15/2017 consistent with ITP.     Current Status:  Pt seen today for follow-up.  She denies any changes in medications. She denies any bleeding episodes.  She is here to go over labs.    Problem List Patient Active Problem List   Diagnosis Date Noted  . Chronic ITP (idiopathic thrombocytopenia) (HCC) [D69.3] 02/04/2018  . Thrombocytopenia ([place]) [D69.6] 10/28/2016  . CAD (coronary artery disease) [I25.10] 08/14/2011  . Dyslipidemia [E78.5] 08/14/2011  . Tobacco abuse [Z72.0] 08/14/2011    Past Medical History Past Medical History:  Diagnosis Date  . Diabetes mellitus   . Dyslipidemia   . Hyperthyroidism     Past Surgical History Past Surgical History:  Procedure Laterality Date  . BREAST BIOPSY Right 1967   EXCISIONAL - NEG  . no surgical hx      Family History Family History  Problem Relation Age of Onset  . Heart Problems Mother   . Other Father        UNK  . Cancer Sister        BREAST  . Breast cancer Sister 73  . Cancer Sister        BREAST  . Breast cancer Sister 83  . Cancer Sister        BREAST  . Diabetes Son      Social History  reports that she has been smoking cigarettes.  She has a 50.00  pack-year smoking history. She has never used smokeless tobacco. She reports that she does not drink alcohol or use drugs.  Medications  Current Outpatient Medications:  .  atorvastatin (LIPITOR) 20 MG tablet, TAKE 1 TABLET BY MOUTH ONCE DAILY *PLEASE  KEEP  UPCOMING  APPOINTMENT  FOR  FUTURE  REFILLS*, Disp: 90 tablet, Rfl: 3 .  cetirizine (ZYRTEC) 10 MG tablet, Take by mouth., Disp: , Rfl:  .  citalopram (CELEXA) 20 MG tablet, Take 20 mg by mouth daily.  , Disp: , Rfl:  .  LANTUS 100 UNIT/ML injection, INJECT 14 UNITS SUBCUTANEOUSLY ONCE DAILY, Disp: , Rfl: 12 .  levothyroxine  (SYNTHROID, LEVOTHROID) 50 MCG tablet, TAKE 1 TABLET BY MOUTH ONCE DAILY ON AN EMPTY STOMACH WITH A GLASS OF WATER AT LEAST 30 TO 60 MINUTES BEFORE BREAKFAST, Disp: , Rfl: 3 .  lisinopril (PRINIVIL,ZESTRIL) 10 MG tablet, Take 10 mg by mouth daily. , Disp: , Rfl:  .  metoprolol tartrate (LOPRESSOR) 25 MG tablet, TAKE 1 TABLET BY MOUTH TWICE DAILY, Disp: 180 tablet, Rfl: 3 .  Multiple Vitamin (MULTIVITAMIN WITH MINERALS) TABS tablet, Take 1 tablet by mouth daily., Disp: , Rfl:  .  NOVOLOG 100 UNIT/ML injection, Inject into the skin. Per SSI orders, Disp: , Rfl:  .  nitroGLYCERIN (NITROSTAT) 0.4 MG SL tablet, Place 0.4 mg under the tongue every 5 (five) minutes as needed.  , Disp: , Rfl:   Allergies Darvon; Propoxyphene; and Sulfa antibiotics  Review of Systems Review of Systems - Oncology ROS as per HPI otherwise 12 point ROS is negative.   Physical Exam  Vitals Wt Readings from Last 3 Encounters:  06/13/18 107 lb 6.4 oz (48.7 kg)  05/02/18 110 lb (49.9 kg)  03/02/18 109 lb 6.4 oz (49.6 kg)   Temp Readings from Last 3 Encounters:  06/13/18 97.8 F (36.6 C) (Oral)  05/02/18 98.2 F (36.8 C) (Oral)  03/02/18 98.3 F (36.8 C) (Oral)   BP Readings from Last 3 Encounters:  06/13/18 (!) 161/59  05/02/18 (!) 121/41  03/02/18 (!) 143/56   Pulse Readings from Last 3 Encounters:  06/13/18 61  05/02/18 76  03/02/18 69   Constitutional: Well-developed, well-nourished, and in no distress.   HENT: Head: Normocephalic and atraumatic.  Mouth/Throat: No oropharyngeal exudate. Mucosa moist. Eyes: Pupils are equal, round, and reactive to light. Conjunctivae are normal. No scleral icterus.  Neck: Normal range of motion. Neck supple. No JVD present.  Cardiovascular: Normal rate, regular rhythm and normal heart sounds.  Exam reveals no gallop and no friction rub.   No murmur heard. Pulmonary/Chest: Effort normal and breath sounds normal. No respiratory distress. No wheezes.No rales.   Abdominal: Soft. Bowel sounds are normal. No distension. There is no tenderness. There is no guarding.  Musculoskeletal: No edema or tenderness.  Lymphadenopathy: No cervical, axillary or supraclavicular adenopathy.  Neurological: Alert and oriented to person, place, and time. No cranial nerve deficit.  Skin: Skin is warm and dry. No rash noted. No erythema. No pallor.  Minor bruises noted on arms.   Psychiatric: Affect and judgment normal.   Labs Appointment on 06/13/2018  Component Date Value Ref Range Status  . WBC 06/13/2018 5.0  4.0 - 10.5 K/uL Final  . RBC 06/13/2018 4.04  3.87 - 5.11 MIL/uL Final  . Hemoglobin 06/13/2018 12.1  12.0 - 15.0 g/dL Final  . HCT 06/13/2018 37.1  36.0 - 46.0 % Final  . MCV 06/13/2018 91.8  78.0 - 100.0 fL Final  .  MCH 06/13/2018 30.0  26.0 - 34.0 pg Final  . MCHC 06/13/2018 32.6  30.0 - 36.0 g/dL Final  . RDW 06/13/2018 12.7  11.5 - 15.5 % Final  . Platelets 06/13/2018 72* 150 - 400 K/uL Final   Comment: SPECIMEN CHECKED FOR CLOTS CONSISTENT WITH PREVIOUS RESULT   . Neutrophils Relative % 06/13/2018 52  % Final  . Neutro Abs 06/13/2018 2.6  1.7 - 7.7 K/uL Final  . Lymphocytes Relative 06/13/2018 40  % Final  . Lymphs Abs 06/13/2018 2.0  0.7 - 4.0 K/uL Final  . Monocytes Relative 06/13/2018 6  % Final  . Monocytes Absolute 06/13/2018 0.3  0.1 - 1.0 K/uL Final  . Eosinophils Relative 06/13/2018 2  % Final  . Eosinophils Absolute 06/13/2018 0.1  0.0 - 0.7 K/uL Final  . Basophils Relative 06/13/2018 0  % Final  . Basophils Absolute 06/13/2018 0.0  0.0 - 0.1 K/uL Final   Performed at [facility], [address], [place],  [place] [address]  . Sodium 06/13/2018 138  135 - 145 mmol/L Final  . Potassium 06/13/2018 4.5  3.5 - 5.1 mmol/L Final  . Chloride 06/13/2018 102  98 - 111 mmol/L Final  . CO2 06/13/2018 31  22 - 32 mmol/L Final  . Glucose, Bld 06/13/2018 242* 70 - 99 mg/dL Final  . BUN 06/13/2018 8  8 - 23 mg/dL Final  . Creatinine, Ser  06/13/2018 0.72  0.44 - 1.00 mg/dL Final  . Calcium 06/13/2018 8.5* 8.9 - 10.3 mg/dL Final  . Total Protein 06/13/2018 6.0* 6.5 - 8.1 g/dL Final  . Albumin 06/13/2018 3.5  3.5 - 5.0 g/dL Final  . AST 06/13/2018 28  15 - 41 U/L Final  . ALT 06/13/2018 21  0 - 44 U/L Final  . Alkaline Phosphatase 06/13/2018 81  38 - 126 U/L Final  . Total Bilirubin 06/13/2018 0.6  0.3 - 1.2 mg/dL Final  . GFR calc non Af Amer 06/13/2018 >60  >60 mL/min Final  . GFR calc Af Amer 06/13/2018 >60  >60 mL/min Final   Comment: (NOTE) The eGFR has been calculated using the CKD EPI equation. This calculation has not been validated in all clinical situations. eGFR's persistently <60 mL/min signify possible Chronic Kidney Disease.   Georgiann Hahn gap 06/13/2018 5  5 - 15 Final   Performed at [facility], [address], [place], [place] [address]  . LDH 06/13/2018 163  98 - 192 U/L Final   Performed at [facility], [address] [place], [place] [address]     Pathology Orders Placed This Encounter  Procedures  . CBC with Differential/Platelet    Standing Status:   Future    Standing Expiration Date:   06/14/2019  . Comprehensive metabolic panel    Standing Status:   Future    Standing Expiration Date:   06/14/2019  . Lactate dehydrogenase    Standing Status:   Future    Standing Expiration Date:   06/14/2019  . CBC with Differential/Platelet    Standing Status:   Future    Standing Expiration Date:   06/14/2019  . Comprehensive metabolic panel    Standing Status:   Future    Standing Expiration Date:   06/14/2019  . Lactate dehydrogenase    Standing Status:   Future    Standing Expiration Date:   06/14/2019       Zoila Shutter MD

## 2018-06-14 DIAGNOSIS — E1065 Type 1 diabetes mellitus with hyperglycemia: Secondary | ICD-10-CM | POA: Diagnosis not present

## 2018-07-22 DIAGNOSIS — E1042 Type 1 diabetes mellitus with diabetic polyneuropathy: Secondary | ICD-10-CM | POA: Diagnosis not present

## 2018-07-22 DIAGNOSIS — E039 Hypothyroidism, unspecified: Secondary | ICD-10-CM | POA: Diagnosis not present

## 2018-07-22 DIAGNOSIS — Z79899 Other long term (current) drug therapy: Secondary | ICD-10-CM | POA: Diagnosis not present

## 2018-07-22 DIAGNOSIS — E7800 Pure hypercholesterolemia, unspecified: Secondary | ICD-10-CM | POA: Diagnosis not present

## 2018-07-22 DIAGNOSIS — I10 Essential (primary) hypertension: Secondary | ICD-10-CM | POA: Diagnosis not present

## 2018-07-29 ENCOUNTER — Other Ambulatory Visit: Payer: Self-pay | Admitting: Internal Medicine

## 2018-07-29 DIAGNOSIS — E1042 Type 1 diabetes mellitus with diabetic polyneuropathy: Secondary | ICD-10-CM | POA: Diagnosis not present

## 2018-07-29 DIAGNOSIS — E039 Hypothyroidism, unspecified: Secondary | ICD-10-CM | POA: Diagnosis not present

## 2018-07-29 DIAGNOSIS — Z79899 Other long term (current) drug therapy: Secondary | ICD-10-CM | POA: Diagnosis not present

## 2018-07-29 DIAGNOSIS — R911 Solitary pulmonary nodule: Secondary | ICD-10-CM

## 2018-07-29 DIAGNOSIS — J431 Panlobular emphysema: Secondary | ICD-10-CM | POA: Diagnosis not present

## 2018-07-29 DIAGNOSIS — D696 Thrombocytopenia, unspecified: Secondary | ICD-10-CM | POA: Diagnosis not present

## 2018-07-29 DIAGNOSIS — Z1239 Encounter for other screening for malignant neoplasm of breast: Secondary | ICD-10-CM | POA: Diagnosis not present

## 2018-07-29 DIAGNOSIS — I10 Essential (primary) hypertension: Secondary | ICD-10-CM | POA: Diagnosis not present

## 2018-08-08 ENCOUNTER — Ambulatory Visit
Admission: RE | Admit: 2018-08-08 | Discharge: 2018-08-08 | Disposition: A | Discharge status: Home / Self Care | Payer: PPO | Source: Ambulatory Visit | Attending: Internal Medicine | Admitting: Internal Medicine

## 2018-08-08 DIAGNOSIS — R911 Solitary pulmonary nodule: Secondary | ICD-10-CM | POA: Diagnosis not present

## 2018-08-08 DIAGNOSIS — I700 Atherosclerosis of aorta: Secondary | ICD-10-CM | POA: Diagnosis not present

## 2018-08-08 DIAGNOSIS — R918 Other nonspecific abnormal finding of lung field: Secondary | ICD-10-CM | POA: Insufficient documentation

## 2018-08-08 DIAGNOSIS — I2510 Atherosclerotic heart disease of native coronary artery without angina pectoris: Secondary | ICD-10-CM | POA: Insufficient documentation

## 2018-08-12 ENCOUNTER — Ambulatory Visit (INDEPENDENT_AMBULATORY_CARE_PROVIDER_SITE_OTHER): Payer: PPO | Admitting: Internal Medicine

## 2018-08-12 ENCOUNTER — Encounter: Payer: Self-pay | Admitting: Internal Medicine

## 2018-08-12 VITALS — BP 146/66 | HR 71 | Ht 62.0 in | Wt 110.8 lb

## 2018-08-12 DIAGNOSIS — R2681 Unsteadiness on feet: Secondary | ICD-10-CM

## 2018-08-12 DIAGNOSIS — Z720 Tobacco use: Secondary | ICD-10-CM

## 2018-08-12 DIAGNOSIS — E785 Hyperlipidemia, unspecified: Secondary | ICD-10-CM

## 2018-08-12 DIAGNOSIS — E162 Hypoglycemia, unspecified: Secondary | ICD-10-CM | POA: Diagnosis not present

## 2018-08-12 DIAGNOSIS — I2510 Atherosclerotic heart disease of native coronary artery without angina pectoris: Secondary | ICD-10-CM

## 2018-08-12 NOTE — Patient Instructions (Addendum)
Medication Instructions: Your physician recommends that you continue on your current medications as directed. Please refer to the Current Medication list given to you today.   Labwork:Your physician recommends that you return for lab work today  Lipid, HGBA1C    Testing/Procedures: None ordered    Follow-Up: Your physician recommends that you schedule a follow-up appointment in: July 2020    Any Other Special Instructions Will Be Listed Below (If Applicable).     Vestibular therapy for unsteady gait    Your physician discussed the hazards of tobacco use. Tobacco use cessation is recommended and techniques and options to help you quit were discussed.       If you need a refill on your cardiac medications before your next appointment, please call your pharmacy.

## 2018-08-12 NOTE — Progress Notes (Signed)
Cardiology Office Note   Date:  08/12/2018   ID:  Madeline [name], DOB [birth year], MRN [id number]  PCP:  Marguarite Arbour, MD  Cardiologist:   Dietrich Pates, MD   F/U of CAD   History of Present Illness: Madeline [name] is a 84 y.o. female with a history of CAD, s/p inferior STEMI om 2012  She had occlusion of the RCA and underwent PTCA with DES to theprox RCA. It was a right dominant system with dual PDAs. Other vessels had irregularities. She was d/cd home on [place].  I saw the pt about 1 year ago    Some times at night witl have occasional L sided chest pain   Wonders if gas   No SOB  None with activity    Legs are weak   No pain   Occurs when going from sitting to standing   Has to sit down   No falls  When walks is OK   Outpatient Medications Prior to Visit  Medication Sig Dispense Refill  . atorvastatin (LIPITOR) 20 MG tablet TAKE 1 TABLET BY MOUTH ONCE DAILY *PLEASE  KEEP  UPCOMING  APPOINTMENT  FOR  FUTURE  REFILLS* 90 tablet 3  . citalopram (CELEXA) 20 MG tablet Take 20 mg by mouth daily.      Marland Kitchen LANTUS 100 UNIT/ML injection INJECT 14 UNITS SUBCUTANEOUSLY ONCE DAILY  12  . levothyroxine (SYNTHROID, LEVOTHROID) 50 MCG tablet TAKE 1 TABLET BY MOUTH ONCE DAILY ON AN EMPTY STOMACH WITH A GLASS OF WATER AT LEAST 30 TO 60 MINUTES BEFORE BREAKFAST  3  . lisinopril (PRINIVIL,ZESTRIL) 10 MG tablet Take 10 mg by mouth daily.     . metoprolol tartrate (LOPRESSOR) 25 MG tablet TAKE 1 TABLET BY MOUTH TWICE DAILY 180 tablet 3  . Multiple Vitamin (MULTIVITAMIN WITH MINERALS) TABS tablet Take 1 tablet by mouth daily.    Marland Kitchen NOVOLOG 100 UNIT/ML injection Inject into the skin. Per SSI orders    . nitroGLYCERIN (NITROSTAT) 0.4 MG SL tablet Place 0.4 mg under the tongue every 5 (five) minutes as needed.      . cetirizine (ZYRTEC) 10 MG tablet Take by mouth.     No facility-administered medications prior to visit.      Allergies:   Darvon; Propoxyphene; and Sulfa antibiotics   Past Medical  History:  Diagnosis Date  . Diabetes mellitus   . Dyslipidemia   . Hyperthyroidism     Past Surgical History:  Procedure Laterality Date  . BREAST BIOPSY Right 1967   EXCISIONAL - NEG  . no surgical hx       Social History:  The patient  reports that she has been smoking cigarettes. She has a 50.00 pack-year smoking history. She has never used smokeless tobacco. She reports that she does not drink alcohol or use drugs.   Family History:  The patient's family history includes Breast cancer (age of onset: 16) in her sister; Breast cancer (age of onset: 72) in her sister; Cancer in her sister, sister, and sister; Diabetes in her son; Heart Problems in her mother; Other in her father.    ROS:  Please see the history of present illness. All other systems are reviewed and  Negative to the above problem except as noted.    PHYSICAL EXAM: VS:  BP (!) 146/66   Pulse 71   Ht 5\' 2"  (1.575 m)   Wt 110 lb 12.8 oz (50.3 kg)   SpO2 98%   BMI  20.27 kg/m   GEN: Thin 84 yo in no acute distress  HEENT: normal  Neck: No JVD    Nocarotid bruits, or masses Cardiac: RRR; no murmurs, rubs, or gallops,no edema  Respiratory:  clear to auscultation bilaterally, normal work of breathing GI: soft, nontender, nondistended, + BS  No hepatomegaly  MS: no deformity Moving all extremities   Skin: warm and dry, no rash Neuro:  Strength and sensation are intact Psych: euthymic mood, full affect   EKG:  EKG not ordered today      Lipid Panel    Component Value Date/Time   CHOL 105 08/13/2017 1232   TRIG 53 08/13/2017 1232   HDL 58 08/13/2017 1232   CHOLHDL 1.8 08/13/2017 1232   CHOLHDL 1.7 08/03/2016 1519   VLDL 19 08/03/2016 1519   LDLCALC 36 08/13/2017 1232      Wt Readings from Last 3 Encounters:  08/12/18 110 lb 12.8 oz (50.3 kg)  06/13/18 107 lb 6.4 oz (48.7 kg)  05/02/18 110 lb (49.9 kg)      ASSESSMENT AND PLAN:  1  CAD  I do not think intermitt discomfort at night is angina    Follow    2.  Tob   COunselled again on cessation  3  HL  Keep on meds   Will check lasbs  4  Weak legs  Will refer to balance rehab for eval    5  DM  Glu high  Will get A1C  F/U in July 2020r  Sooner for sympotms     Current medicines are reviewed at length with the patient today.  The patient does not have concerns regarding medicines. Signed, Dietrich Pates, MD  08/12/2018 10:56 AM    [facility] [address] [place], [place], [place]  [address] Phone: [phone]; Fax: [phone]

## 2018-08-13 LAB — LIPID PANEL
Chol/HDL Ratio: 1.8 ratio (ref 0.0–4.4)
Cholesterol, Total: 112 mg/dL (ref 100–199)
HDL: 61 mg/dL (ref >39)
LDL Calculated: 43 mg/dL (ref 0–99)
Triglycerides: 42 mg/dL (ref 0–149)
VLDL Cholesterol Cal: 8 mg/dL (ref 5–40)

## 2018-08-13 LAB — HEMOGLOBIN A1C
Est. average glucose Bld gHb Est-mCnc: 212 mg/dL
Hgb A1c MFr Bld: 9 % — ABNORMAL HIGH (ref 4.8–5.6)

## 2018-08-15 ENCOUNTER — Inpatient Hospital Stay (HOSPITAL_COMMUNITY): Payer: PPO | Attending: Hematology

## 2018-08-15 DIAGNOSIS — D693 Immune thrombocytopenic purpura: Secondary | ICD-10-CM | POA: Insufficient documentation

## 2018-08-15 LAB — COMPREHENSIVE METABOLIC PANEL
ALT: 34 U/L (ref 0–44)
AST: 37 U/L (ref 15–41)
Albumin: 3.6 g/dL (ref 3.5–5.0)
Alkaline Phosphatase: 72 U/L (ref 38–126)
Anion gap: 8 (ref 5–15)
BUN: 8 mg/dL (ref 8–23)
CO2: 28 mmol/L (ref 22–32)
Calcium: 9 mg/dL (ref 8.9–10.3)
Chloride: 105 mmol/L (ref 98–111)
Creatinine, Ser: 0.68 mg/dL (ref 0.44–1.00)
GFR calc Af Amer: >60 mL/min (ref >60)
GFR calc non Af Amer: >60 mL/min (ref >60)
Glucose, Bld: 94 mg/dL (ref 70–99)
Potassium: 4.5 mmol/L (ref 3.5–5.1)
Sodium: 141 mmol/L (ref 135–145)
Total Bilirubin: 0.7 mg/dL (ref 0.3–1.2)
Total Protein: 6.5 g/dL (ref 6.5–8.1)

## 2018-08-15 LAB — CBC WITH DIFFERENTIAL/PLATELET
Basophils Absolute: 0 10*3/uL (ref 0.0–0.1)
Basophils Relative: 1 %
Eosinophils Absolute: 0.1 10*3/uL (ref 0.0–0.7)
Eosinophils Relative: 2 %
HCT: 39.3 % (ref 36.0–46.0)
Hemoglobin: 12.8 g/dL (ref 12.0–15.0)
Lymphocytes Relative: 45 %
Lymphs Abs: 2.5 10*3/uL (ref 0.7–4.0)
MCH: 30.5 pg (ref 26.0–34.0)
MCHC: 32.6 g/dL (ref 30.0–36.0)
MCV: 93.8 fL (ref 78.0–100.0)
Monocytes Absolute: 0.5 10*3/uL (ref 0.1–1.0)
Monocytes Relative: 8 %
Neutro Abs: 2.4 10*3/uL (ref 1.7–7.7)
Neutrophils Relative %: 44 %
Platelets: 59 10*3/uL — ABNORMAL LOW (ref 150–400)
RBC: 4.19 MIL/uL (ref 3.87–5.11)
RDW: 12.9 % (ref 11.5–15.5)
WBC: 5.5 10*3/uL (ref 4.0–10.5)

## 2018-08-15 LAB — LACTATE DEHYDROGENASE: LDH: 170 U/L (ref 98–192)

## 2018-08-17 ENCOUNTER — Telehealth (HOSPITAL_COMMUNITY): Payer: Self-pay | Admitting: *Deleted

## 2018-08-17 ENCOUNTER — Other Ambulatory Visit (HOSPITAL_COMMUNITY): Payer: Self-pay | Admitting: Nurse Practitioner

## 2018-08-17 NOTE — Telephone Encounter (Signed)
Pt aware of recent lab results and aware to keep scheduled appointments for labs and doctor visits. Pt also advised that if she has and abnormal bleeding or bruising to call us and let us know. Pt verbalized understanding.

## 2018-09-05 ENCOUNTER — Other Ambulatory Visit: Payer: Self-pay | Admitting: Internal Medicine

## 2018-09-05 DIAGNOSIS — Z1231 Encounter for screening mammogram for malignant neoplasm of breast: Secondary | ICD-10-CM

## 2018-09-12 DIAGNOSIS — E1043 Type 1 diabetes mellitus with diabetic autonomic (poly)neuropathy: Secondary | ICD-10-CM | POA: Diagnosis not present

## 2018-09-14 DIAGNOSIS — F17200 Nicotine dependence, unspecified, uncomplicated: Secondary | ICD-10-CM | POA: Diagnosis not present

## 2018-09-14 DIAGNOSIS — E063 Autoimmune thyroiditis: Secondary | ICD-10-CM | POA: Diagnosis not present

## 2018-09-14 DIAGNOSIS — E1065 Type 1 diabetes mellitus with hyperglycemia: Secondary | ICD-10-CM | POA: Diagnosis not present

## 2018-09-14 DIAGNOSIS — E038 Other specified hypothyroidism: Secondary | ICD-10-CM | POA: Diagnosis not present

## 2018-09-14 DIAGNOSIS — E1043 Type 1 diabetes mellitus with diabetic autonomic (poly)neuropathy: Secondary | ICD-10-CM | POA: Diagnosis not present

## 2018-09-14 DIAGNOSIS — I10 Essential (primary) hypertension: Secondary | ICD-10-CM | POA: Diagnosis not present

## 2018-09-20 ENCOUNTER — Encounter: Payer: Self-pay | Admitting: Emergency Medicine

## 2018-09-20 ENCOUNTER — Emergency Department: Payer: PPO

## 2018-09-20 ENCOUNTER — Other Ambulatory Visit: Payer: Self-pay

## 2018-09-20 ENCOUNTER — Inpatient Hospital Stay
Admission: EM | Admit: 2018-09-20 | Discharge: 2018-09-22 | DRG: 309 | Disposition: A | Discharge status: Home / Self Care | Payer: PPO | Attending: Specialist | Admitting: Specialist

## 2018-09-20 DIAGNOSIS — R001 Bradycardia, unspecified: Secondary | ICD-10-CM | POA: Diagnosis not present

## 2018-09-20 DIAGNOSIS — Z794 Long term (current) use of insulin: Secondary | ICD-10-CM

## 2018-09-20 DIAGNOSIS — Z79890 Hormone replacement therapy (postmenopausal): Secondary | ICD-10-CM | POA: Diagnosis not present

## 2018-09-20 DIAGNOSIS — Z803 Family history of malignant neoplasm of breast: Secondary | ICD-10-CM | POA: Diagnosis not present

## 2018-09-20 DIAGNOSIS — I495 Sick sinus syndrome: Principal | ICD-10-CM | POA: Diagnosis present

## 2018-09-20 DIAGNOSIS — Z955 Presence of coronary angioplasty implant and graft: Secondary | ICD-10-CM | POA: Diagnosis not present

## 2018-09-20 DIAGNOSIS — Z8249 Family history of ischemic heart disease and other diseases of the circulatory system: Secondary | ICD-10-CM

## 2018-09-20 DIAGNOSIS — E860 Dehydration: Secondary | ICD-10-CM | POA: Diagnosis present

## 2018-09-20 DIAGNOSIS — I25118 Atherosclerotic heart disease of native coronary artery with other forms of angina pectoris: Secondary | ICD-10-CM | POA: Diagnosis not present

## 2018-09-20 DIAGNOSIS — I10 Essential (primary) hypertension: Secondary | ICD-10-CM | POA: Diagnosis present

## 2018-09-20 DIAGNOSIS — F17210 Nicotine dependence, cigarettes, uncomplicated: Secondary | ICD-10-CM | POA: Diagnosis not present

## 2018-09-20 DIAGNOSIS — I5030 Unspecified diastolic (congestive) heart failure: Secondary | ICD-10-CM | POA: Diagnosis not present

## 2018-09-20 DIAGNOSIS — Z888 Allergy status to other drugs, medicaments and biological substances status: Secondary | ICD-10-CM | POA: Diagnosis not present

## 2018-09-20 DIAGNOSIS — E7800 Pure hypercholesterolemia, unspecified: Secondary | ICD-10-CM | POA: Diagnosis not present

## 2018-09-20 DIAGNOSIS — I2510 Atherosclerotic heart disease of native coronary artery without angina pectoris: Secondary | ICD-10-CM | POA: Diagnosis not present

## 2018-09-20 DIAGNOSIS — I959 Hypotension, unspecified: Secondary | ICD-10-CM | POA: Diagnosis not present

## 2018-09-20 DIAGNOSIS — Z885 Allergy status to narcotic agent status: Secondary | ICD-10-CM | POA: Diagnosis not present

## 2018-09-20 DIAGNOSIS — I252 Old myocardial infarction: Secondary | ICD-10-CM | POA: Diagnosis not present

## 2018-09-20 DIAGNOSIS — Z833 Family history of diabetes mellitus: Secondary | ICD-10-CM

## 2018-09-20 DIAGNOSIS — Z882 Allergy status to sulfonamides status: Secondary | ICD-10-CM | POA: Diagnosis not present

## 2018-09-20 DIAGNOSIS — Z9641 Presence of insulin pump (external) (internal): Secondary | ICD-10-CM | POA: Diagnosis not present

## 2018-09-20 DIAGNOSIS — E785 Hyperlipidemia, unspecified: Secondary | ICD-10-CM | POA: Diagnosis not present

## 2018-09-20 DIAGNOSIS — E119 Type 2 diabetes mellitus without complications: Secondary | ICD-10-CM

## 2018-09-20 DIAGNOSIS — E039 Hypothyroidism, unspecified: Secondary | ICD-10-CM | POA: Diagnosis not present

## 2018-09-20 DIAGNOSIS — Z79899 Other long term (current) drug therapy: Secondary | ICD-10-CM | POA: Diagnosis not present

## 2018-09-20 DIAGNOSIS — D693 Immune thrombocytopenic purpura: Secondary | ICD-10-CM | POA: Diagnosis present

## 2018-09-20 DIAGNOSIS — X500XXA Overexertion from strenuous movement or load, initial encounter: Secondary | ICD-10-CM | POA: Diagnosis not present

## 2018-09-20 DIAGNOSIS — R0602 Shortness of breath: Secondary | ICD-10-CM | POA: Diagnosis not present

## 2018-09-20 DIAGNOSIS — F329 Major depressive disorder, single episode, unspecified: Secondary | ICD-10-CM | POA: Diagnosis not present

## 2018-09-20 LAB — CBC WITH DIFFERENTIAL/PLATELET
Abs Immature Granulocytes: 0.02 10*3/uL (ref 0.00–0.07)
Basophils Absolute: 0.1 10*3/uL (ref 0.0–0.1)
Basophils Relative: 1 %
Eosinophils Absolute: 0.1 10*3/uL (ref 0.0–0.5)
Eosinophils Relative: 2 %
HCT: 34.2 % — ABNORMAL LOW (ref 36.0–46.0)
Hemoglobin: 11.4 g/dL — ABNORMAL LOW (ref 12.0–15.0)
Immature Granulocytes: 0 %
Lymphocytes Relative: 40 %
Lymphs Abs: 2.5 10*3/uL (ref 0.7–4.0)
MCH: 31.1 pg (ref 26.0–34.0)
MCHC: 33.3 g/dL (ref 30.0–36.0)
MCV: 93.4 fL (ref 80.0–100.0)
Monocytes Absolute: 0.5 10*3/uL (ref 0.1–1.0)
Monocytes Relative: 8 %
Neutro Abs: 3.2 10*3/uL (ref 1.7–7.7)
Neutrophils Relative %: 49 %
Platelets: 43 10*3/uL — ABNORMAL LOW (ref 150–400)
RBC: 3.66 MIL/uL — ABNORMAL LOW (ref 3.87–5.11)
RDW: 12.5 % (ref 11.5–15.5)
WBC: 6.4 10*3/uL (ref 4.0–10.5)
nRBC: 0 % (ref 0.0–0.2)

## 2018-09-20 LAB — MAGNESIUM: Magnesium: 2.1 mg/dL (ref 1.7–2.4)

## 2018-09-20 LAB — COMPREHENSIVE METABOLIC PANEL
ALT: 77 U/L — ABNORMAL HIGH (ref 0–44)
AST: 119 U/L — ABNORMAL HIGH (ref 15–41)
Albumin: 3.4 g/dL — ABNORMAL LOW (ref 3.5–5.0)
Alkaline Phosphatase: 70 U/L (ref 38–126)
Anion gap: 7 (ref 5–15)
BUN: 16 mg/dL (ref 8–23)
CO2: 27 mmol/L (ref 22–32)
Calcium: 8.5 mg/dL — ABNORMAL LOW (ref 8.9–10.3)
Chloride: 102 mmol/L (ref 98–111)
Creatinine, Ser: 0.98 mg/dL (ref 0.44–1.00)
GFR calc Af Amer: >60 mL/min (ref >60)
GFR calc non Af Amer: 52 mL/min — ABNORMAL LOW (ref >60)
Glucose, Bld: 289 mg/dL — ABNORMAL HIGH (ref 70–99)
Potassium: 4.3 mmol/L (ref 3.5–5.1)
Sodium: 136 mmol/L (ref 135–145)
Total Bilirubin: 0.4 mg/dL (ref 0.3–1.2)
Total Protein: 5.7 g/dL — ABNORMAL LOW (ref 6.5–8.1)

## 2018-09-20 LAB — TROPONIN I: Troponin I: <0.03 ng/mL (ref <0.03)

## 2018-09-20 LAB — TSH: TSH: 1.85 u[IU]/mL (ref 0.350–4.500)

## 2018-09-20 MED ORDER — SODIUM CHLORIDE 0.9 % IV BOLUS
1000.0000 mL | Freq: Once | INTRAVENOUS | Status: AC
  Administered 2018-09-20: 1000 mL via INTRAVENOUS

## 2018-09-20 MED ORDER — ALBUTEROL SULFATE (2.5 MG/3ML) 0.083% IN NEBU
2.5000 mg | INHALATION_SOLUTION | Freq: Once | RESPIRATORY_TRACT | Status: AC
  Administered 2018-09-20: 2.5 mg via RESPIRATORY_TRACT
  Filled 2018-09-20: qty 3

## 2018-09-20 MED ORDER — EPINEPHRINE PF 1 MG/ML IJ SOLN
0.5000 ug/min | INTRAVENOUS | Status: DC
  Filled 2018-09-20: qty 4

## 2018-09-20 MED ORDER — IPRATROPIUM-ALBUTEROL 0.5-2.5 (3) MG/3ML IN SOLN
3.0000 mL | Freq: Once | RESPIRATORY_TRACT | Status: AC
  Administered 2018-09-20: 3 mL via RESPIRATORY_TRACT
  Filled 2018-09-20: qty 3

## 2018-09-20 NOTE — ED Triage Notes (Signed)
Pt reports that she became SOB today, Pt is able to speak in complete clear sentences. She states that she wants to make sure she doesn't have pneumonia. She states that she feels that she may have it because she "cant breath and feels weak.

## 2018-09-20 NOTE — ED Provider Notes (Signed)
[facility] Emergency Department Provider Note    First MD Initiated Contact with Patient 09/20/18 2152     (approximate)  I have reviewed the triage vital signs and the nursing notes.   HISTORY  Chief Complaint Shortness of Breath    HPI Madeline [name] is a 84 y.o. female with a history of diabetes hypothyroidism as well as high cholesterol status post stent placement presents the ER with generalized myalgias and weakness.  Has had some shortness of breath.  No cough.  No nausea or vomiting.  Denies any chest pain or pressure.  Denies any history of A. fib.    Past Medical History:  Diagnosis Date  . Diabetes mellitus   . Dyslipidemia   . Hyperthyroidism    Family History  Problem Relation Age of Onset  . Heart Problems Mother   . Other Father        UNK  . Cancer Sister        BREAST  . Breast cancer Sister 81  . Cancer Sister        BREAST  . Breast cancer Sister 58  . Cancer Sister        BREAST  . Diabetes Son    Past Surgical History:  Procedure Laterality Date  . BREAST BIOPSY Right 1967   EXCISIONAL - NEG  . no surgical hx     Patient Active Problem List   Diagnosis Date Noted  . Chronic ITP (idiopathic thrombocytopenia) (HCC) 02/04/2018  . Thrombocytopenia (HCC) 10/28/2016  . CAD (coronary artery disease) 08/14/2011  . Dyslipidemia 08/14/2011  . Tobacco abuse 08/14/2011      Prior to Admission medications   Medication Sig Start Date End Date Taking? Authorizing Provider  atorvastatin (LIPITOR) 20 MG tablet TAKE 1 TABLET BY MOUTH ONCE DAILY *PLEASE  KEEP  UPCOMING  APPOINTMENT  FOR  FUTURE  REFILLS* Patient taking differently: Take 20 mg by mouth at bedtime.  11/18/17  Yes Pricilla Riffle, MD  cetirizine (ZYRTEC) 10 MG tablet Take 10 mg by mouth daily.   Yes [provider]  citalopram (CELEXA) 20 MG tablet Take 20 mg by mouth daily.     Yes [provider]  Insulin Human (INSULIN PUMP) SOLN Inject 1  each into the skin continuous. Used with Novolog insulin   Yes [provider]  levothyroxine (SYNTHROID, LEVOTHROID) 50 MCG tablet Take 50 mcg by mouth daily before breakfast.  05/16/18  Yes [provider]  lisinopril (PRINIVIL,ZESTRIL) 10 MG tablet Take 10 mg by mouth daily.  10/08/14  Yes [provider]  metoprolol tartrate (LOPRESSOR) 25 MG tablet TAKE 1 TABLET BY MOUTH TWICE DAILY Patient taking differently: Take 25 mg by mouth 2 (two) times daily.  01/10/18  Yes Pricilla Riffle, MD  Multiple Vitamin (MULTIVITAMIN WITH MINERALS) TABS tablet Take 1 tablet by mouth daily.   Yes [provider]  NOVOLOG 100 UNIT/ML injection Inject 0-50 Units into the skin continuous. Up to 50 units/ day via pump 12/08/17  Yes [provider]    Allergies Darvon; Propoxyphene; and Sulfa antibiotics    Social History Social History   Tobacco Use  . Smoking status: Current Every Day Smoker    Packs/day: 1.00    Years: 50.00    Pack years: 50.00    Types: Cigarettes  . Smokeless tobacco: Never Used  Substance Use Topics  . Alcohol use: No  . Drug use: No    Review of  Systems Patient denies headaches, rhinorrhea, blurry vision, numbness, shortness of breath, chest pain, edema, cough, abdominal pain, nausea, vomiting, diarrhea, dysuria, fevers, rashes or hallucinations unless otherwise stated above in HPI. ____________________________________________   PHYSICAL EXAM:  VITAL SIGNS: Vitals:   09/20/18 2300 09/20/18 2330  BP: (!) 87/39 (!) 117/55  Pulse: (!) 40 (!) 36  Resp: 17 19  Temp:    SpO2: 97% 98%    Constitutional: Alert and oriented.  Eyes: Conjunctivae are normal.  Head: Atraumatic. Nose: No congestion/rhinnorhea. Mouth/Throat: Mucous membranes are moist.   Neck: No stridor. Painless ROM.  Cardiovascular:slow, irregularly irregular rhythm. Grossly normal heart sounds.  Good peripheral circulation. Respiratory: Normal respiratory effort.   No retractions. Lungs CTAB. Gastrointestinal: Soft and nontender. No distention. No abdominal bruits. No CVA tenderness. Genitourinary:  Musculoskeletal: No lower extremity tenderness nor edema.  No joint effusions. Neurologic:  Normal speech and language. No gross focal neurologic deficits are appreciated. No facial droop Skin:  Skin is warm, dry and intact. No rash noted. Psychiatric: Mood and affect are normal. Speech and behavior are normal.  ____________________________________________   LABS (all labs ordered are listed, but only abnormal results are displayed)  Results for orders placed or performed during the hospital encounter of 09/20/18 (from the past 24 hour(s))  CBC with Differential/Platelet     Status: Abnormal   Collection Time: 09/20/18 10:31 PM  Result Value Ref Range   WBC 6.4 4.0 - 10.5 K/uL   RBC 3.66 (L) 3.87 - 5.11 MIL/uL   Hemoglobin 11.4 (L) 12.0 - 15.0 g/dL   HCT 16.1 (L) 09.6 - 04.5 %   MCV 93.4 80.0 - 100.0 fL   MCH 31.1 26.0 - 34.0 pg   MCHC 33.3 30.0 - 36.0 g/dL   RDW 40.9 81.1 - 91.4 %   Platelets 43 (L) 150 - 400 K/uL   nRBC 0.0 0.0 - 0.2 %   Neutrophils Relative % 49 %   Neutro Abs 3.2 1.7 - 7.7 K/uL   Lymphocytes Relative 40 %   Lymphs Abs 2.5 0.7 - 4.0 K/uL   Monocytes Relative 8 %   Monocytes Absolute 0.5 0.1 - 1.0 K/uL   Eosinophils Relative 2 %   Eosinophils Absolute 0.1 0.0 - 0.5 K/uL   Basophils Relative 1 %   Basophils Absolute 0.1 0.0 - 0.1 K/uL   Immature Granulocytes 0 %   Abs Immature Granulocytes 0.02 0.00 - 0.07 K/uL  Comprehensive metabolic panel     Status: Abnormal   Collection Time: 09/20/18 10:31 PM  Result Value Ref Range   Sodium 136 135 - 145 mmol/L   Potassium 4.3 3.5 - 5.1 mmol/L   Chloride 102 98 - 111 mmol/L   CO2 27 22 - 32 mmol/L   Glucose, Bld 289 (H) 70 - 99 mg/dL   BUN 16 8 - 23 mg/dL   Creatinine, Ser 7.82 0.44 - 1.00 mg/dL   Calcium 8.5 (L) 8.9 - 10.3 mg/dL   Total Protein 5.7 (L) 6.5 - 8.1 g/dL    Albumin 3.4 (L) 3.5 - 5.0 g/dL   AST 956 (H) 15 - 41 U/L   ALT 77 (H) 0 - 44 U/L   Alkaline Phosphatase 70 38 - 126 U/L   Total Bilirubin 0.4 0.3 - 1.2 mg/dL   GFR calc non Af Amer 52 (L) >60 mL/min   GFR calc Af Amer >60 >60 mL/min   Anion gap 7 5 - 15  Troponin I     Status:  None   Collection Time: 09/20/18 10:31 PM  Result Value Ref Range   Troponin I <0.03 <0.03 ng/mL  Magnesium     Status: None   Collection Time: 09/20/18 10:31 PM  Result Value Ref Range   Magnesium 2.1 1.7 - 2.4 mg/dL  TSH     Status: None   Collection Time: 09/20/18 10:31 PM  Result Value Ref Range   TSH 1.850 0.350 - 4.500 uIU/mL   ____________________________________________  EKG My review and personal interpretation at Time: 21:48   Indication: tachycardia  Rate: 50  Rhythm: slow afib Axis: normal Other: nonspecific st abn, unchanged morphology as compared to 09/11/16 ____________________________________________  RADIOLOGY  I personally reviewed all radiographic images ordered to evaluate for the above acute complaints and reviewed radiology reports and findings.  These findings were personally discussed with the patient.  Please see medical record for radiology report.  ____________________________________________   PROCEDURES  Procedure(s) performed:  .Critical Care Performed by: Willy Eddy, MD Authorized by: Willy Eddy, MD   Critical care provider statement:    Critical care time (minutes):  30   Critical care time was exclusive of:  Separately billable procedures and treating other patients   Critical care was necessary to treat or prevent imminent or life-threatening deterioration of the following conditions:  Cardiac failure   Critical care was time spent personally by me on the following activities:  Development of treatment plan with patient or surrogate, discussions with consultants, evaluation of patient's response to treatment, examination of patient, obtaining history  from patient or surrogate, ordering and performing treatments and interventions, ordering and review of laboratory studies, ordering and review of radiographic studies, pulse oximetry, re-evaluation of patient's condition and review of old charts      Critical Care performed: yes ____________________________________________   INITIAL IMPRESSION / ASSESSMENT AND PLAN / ED COURSE  Pertinent labs & imaging results that were available during my care of the patient were reviewed by me and considered in my medical decision making (see chart for details).   DDX: Dysrhythmia, lecture light abnormality, anemia, tachybradycardia syndrome, A. fib, medication reaction  Madeline [name] is a 84 y.o. who presents to the ED with symptoms as described above.  Patient arrives a febrile tachycardic with low blood pressure.  My evaluation however the patient is now bradycardic.  EKG shows slow A. fib.  Possible tachybradycardia syndrome.  Blood work sent for the above differential.  The patient will be placed on continuous pulse oximetry and telemetry for monitoring.  Laboratory evaluation will be sent to evaluate for the above complaints.  Will be placed on pacer pads.  Clinical Course as of Sep 20 2348  Tue Sep 20, 2018  2347 Patient rechecked.  Denies any pain at this time.  Still bradycardia with blood pressure improving with IV fluids.  Mildly elevated liver enzymes.  No abdominal pain at this time.  Troponin is negative.  At this point do feel patient stable and appropriate for admission to the hospital for further hemodynamic monitoring.   [PR]    Clinical Course User Index [PR] Willy Eddy, MD     As part of my medical decision making, I reviewed the following data within the electronic MEDICAL RECORD NUMBER Nursing notes reviewed and incorporated, Labs reviewed, notes from prior ED visits and [place] Controlled Substance Database   ____________________________________________   FINAL CLINICAL  IMPRESSION(S) / ED DIAGNOSES  Final diagnoses:  Symptomatic bradycardia      NEW MEDICATIONS STARTED DURING THIS  VISIT:  New Prescriptions   No medications on file     Note:  This document was prepared using Dragon voice recognition software and may include unintentional dictation errors.    Willy Eddy, MD 09/20/18 [phone]

## 2018-09-20 NOTE — ED Notes (Signed)
While pt was being triaged her heart rate was in the 130's we hooked her up to the EKG and she dropped down to 48. Pt reports that she has had stents

## 2018-09-21 ENCOUNTER — Inpatient Hospital Stay (HOSPITAL_COMMUNITY)
Admit: 2018-09-21 | Discharge: 2018-09-21 | Disposition: A | Discharge status: Home / Self Care | Payer: PPO | Attending: Physician Assistant | Admitting: Physician Assistant

## 2018-09-21 DIAGNOSIS — I5030 Unspecified diastolic (congestive) heart failure: Secondary | ICD-10-CM | POA: Diagnosis not present

## 2018-09-21 DIAGNOSIS — Z885 Allergy status to narcotic agent status: Secondary | ICD-10-CM | POA: Diagnosis not present

## 2018-09-21 DIAGNOSIS — I10 Essential (primary) hypertension: Secondary | ICD-10-CM | POA: Diagnosis present

## 2018-09-21 DIAGNOSIS — I959 Hypotension, unspecified: Secondary | ICD-10-CM

## 2018-09-21 DIAGNOSIS — I495 Sick sinus syndrome: Principal | ICD-10-CM

## 2018-09-21 DIAGNOSIS — Z794 Long term (current) use of insulin: Secondary | ICD-10-CM | POA: Diagnosis not present

## 2018-09-21 DIAGNOSIS — Z882 Allergy status to sulfonamides status: Secondary | ICD-10-CM | POA: Diagnosis not present

## 2018-09-21 DIAGNOSIS — I2510 Atherosclerotic heart disease of native coronary artery without angina pectoris: Secondary | ICD-10-CM | POA: Diagnosis present

## 2018-09-21 DIAGNOSIS — F17210 Nicotine dependence, cigarettes, uncomplicated: Secondary | ICD-10-CM | POA: Diagnosis present

## 2018-09-21 DIAGNOSIS — E119 Type 2 diabetes mellitus without complications: Secondary | ICD-10-CM

## 2018-09-21 DIAGNOSIS — Z833 Family history of diabetes mellitus: Secondary | ICD-10-CM | POA: Diagnosis not present

## 2018-09-21 DIAGNOSIS — Z9641 Presence of insulin pump (external) (internal): Secondary | ICD-10-CM | POA: Diagnosis present

## 2018-09-21 DIAGNOSIS — Z79890 Hormone replacement therapy (postmenopausal): Secondary | ICD-10-CM | POA: Diagnosis not present

## 2018-09-21 DIAGNOSIS — Z803 Family history of malignant neoplasm of breast: Secondary | ICD-10-CM | POA: Diagnosis not present

## 2018-09-21 DIAGNOSIS — Z955 Presence of coronary angioplasty implant and graft: Secondary | ICD-10-CM | POA: Diagnosis not present

## 2018-09-21 DIAGNOSIS — Z888 Allergy status to other drugs, medicaments and biological substances status: Secondary | ICD-10-CM | POA: Diagnosis not present

## 2018-09-21 DIAGNOSIS — Z79899 Other long term (current) drug therapy: Secondary | ICD-10-CM | POA: Diagnosis not present

## 2018-09-21 DIAGNOSIS — E860 Dehydration: Secondary | ICD-10-CM | POA: Diagnosis present

## 2018-09-21 DIAGNOSIS — I25118 Atherosclerotic heart disease of native coronary artery with other forms of angina pectoris: Secondary | ICD-10-CM

## 2018-09-21 DIAGNOSIS — I252 Old myocardial infarction: Secondary | ICD-10-CM | POA: Diagnosis not present

## 2018-09-21 DIAGNOSIS — F329 Major depressive disorder, single episode, unspecified: Secondary | ICD-10-CM | POA: Diagnosis present

## 2018-09-21 DIAGNOSIS — Z8249 Family history of ischemic heart disease and other diseases of the circulatory system: Secondary | ICD-10-CM | POA: Diagnosis not present

## 2018-09-21 DIAGNOSIS — E785 Hyperlipidemia, unspecified: Secondary | ICD-10-CM | POA: Diagnosis present

## 2018-09-21 DIAGNOSIS — R001 Bradycardia, unspecified: Secondary | ICD-10-CM | POA: Diagnosis present

## 2018-09-21 DIAGNOSIS — E7800 Pure hypercholesterolemia, unspecified: Secondary | ICD-10-CM | POA: Diagnosis present

## 2018-09-21 DIAGNOSIS — X500XXA Overexertion from strenuous movement or load, initial encounter: Secondary | ICD-10-CM | POA: Diagnosis not present

## 2018-09-21 DIAGNOSIS — E039 Hypothyroidism, unspecified: Secondary | ICD-10-CM | POA: Diagnosis present

## 2018-09-21 DIAGNOSIS — D693 Immune thrombocytopenic purpura: Secondary | ICD-10-CM | POA: Diagnosis present

## 2018-09-21 LAB — CBC
HCT: 33.2 % — ABNORMAL LOW (ref 36.0–46.0)
Hemoglobin: 10.5 g/dL — ABNORMAL LOW (ref 12.0–15.0)
MCH: 30.3 pg (ref 26.0–34.0)
MCHC: 31.6 g/dL (ref 30.0–36.0)
MCV: 95.7 fL (ref 80.0–100.0)
Platelets: 40 10*3/uL — ABNORMAL LOW (ref 150–400)
RBC: 3.47 MIL/uL — ABNORMAL LOW (ref 3.87–5.11)
RDW: 12.6 % (ref 11.5–15.5)
WBC: 5.5 10*3/uL (ref 4.0–10.5)
nRBC: 0 % (ref 0.0–0.2)

## 2018-09-21 LAB — BASIC METABOLIC PANEL
Anion gap: 6 (ref 5–15)
BUN: 15 mg/dL (ref 8–23)
CO2: 27 mmol/L (ref 22–32)
Calcium: 8.1 mg/dL — ABNORMAL LOW (ref 8.9–10.3)
Chloride: 105 mmol/L (ref 98–111)
Creatinine, Ser: 0.74 mg/dL (ref 0.44–1.00)
GFR calc Af Amer: >60 mL/min (ref >60)
GFR calc non Af Amer: >60 mL/min (ref >60)
Glucose, Bld: 255 mg/dL — ABNORMAL HIGH (ref 70–99)
Potassium: 4.3 mmol/L (ref 3.5–5.1)
Sodium: 138 mmol/L (ref 135–145)

## 2018-09-21 LAB — ECHOCARDIOGRAM COMPLETE
Height: 62 in
Weight: 1760 oz

## 2018-09-21 LAB — TROPONIN I
Troponin I: <0.03 ng/mL (ref <0.03)
Troponin I: <0.03 ng/mL (ref <0.03)
Troponin I: <0.03 ng/mL (ref <0.03)

## 2018-09-21 LAB — GLUCOSE, CAPILLARY
Glucose-Capillary: 237 mg/dL — ABNORMAL HIGH (ref 70–99)
Glucose-Capillary: 385 mg/dL — ABNORMAL HIGH (ref 70–99)
Glucose-Capillary: 392 mg/dL — ABNORMAL HIGH (ref 70–99)
Glucose-Capillary: 192 mg/dL — ABNORMAL HIGH (ref 70–99)
Glucose-Capillary: 290 mg/dL — ABNORMAL HIGH (ref 70–99)
Glucose-Capillary: 156 mg/dL — ABNORMAL HIGH (ref 70–99)

## 2018-09-21 MED ORDER — CITALOPRAM HYDROBROMIDE 20 MG PO TABS
20.0000 mg | ORAL_TABLET | Freq: Every day | ORAL | Status: DC
  Administered 2018-09-21 – 2018-09-22 (×2): 20 mg via ORAL
  Filled 2018-09-21 (×2): qty 1

## 2018-09-21 MED ORDER — LEVOTHYROXINE SODIUM 50 MCG PO TABS
50.0000 ug | ORAL_TABLET | Freq: Every day | ORAL | Status: DC
  Administered 2018-09-21 – 2018-09-22 (×2): 50 ug via ORAL
  Filled 2018-09-21 (×2): qty 1

## 2018-09-21 MED ORDER — INSULIN ASPART 100 UNIT/ML ~~LOC~~ SOLN: 0.0000 [IU] | Freq: Every day | SUBCUTANEOUS | Status: DC

## 2018-09-21 MED ORDER — INSULIN ASPART 100 UNIT/ML ~~LOC~~ SOLN
0.0000 [IU] | Freq: Three times a day (TID) | SUBCUTANEOUS | Status: DC
  Administered 2018-09-21: 2 [IU] via SUBCUTANEOUS
  Administered 2018-09-21: 9 [IU] via SUBCUTANEOUS
  Administered 2018-09-21: 5 [IU] via SUBCUTANEOUS
  Administered 2018-09-22: 1 [IU] via SUBCUTANEOUS
  Filled 2018-09-21 (×4): qty 1

## 2018-09-21 MED ORDER — ONDANSETRON HCL 4 MG/2ML IJ SOLN
4.0000 mg | Freq: Four times a day (QID) | INTRAMUSCULAR | Status: DC | PRN
  Administered 2018-09-21: 4 mg via INTRAVENOUS
  Filled 2018-09-21: qty 2

## 2018-09-21 MED ORDER — ACETAMINOPHEN 650 MG RE SUPP: 650.0000 mg | Freq: Four times a day (QID) | RECTAL | Status: DC | PRN

## 2018-09-21 MED ORDER — SODIUM CHLORIDE 0.9 % IV SOLN
INTRAVENOUS | Status: AC
  Administered 2018-09-21: 03:00:00 via INTRAVENOUS

## 2018-09-21 MED ORDER — INSULIN GLARGINE 100 UNIT/ML ~~LOC~~ SOLN
12.0000 [IU] | Freq: Every day | SUBCUTANEOUS | Status: DC
  Administered 2018-09-21: 12 [IU] via SUBCUTANEOUS
  Filled 2018-09-21 (×2): qty 0.12

## 2018-09-21 MED ORDER — SODIUM CHLORIDE 0.9 % IV BOLUS
250.0000 mL | Freq: Once | INTRAVENOUS | Status: AC
  Administered 2018-09-21: 250 mL via INTRAVENOUS

## 2018-09-21 MED ORDER — ACETAMINOPHEN 325 MG PO TABS: 650.0000 mg | ORAL_TABLET | Freq: Four times a day (QID) | ORAL | Status: DC | PRN

## 2018-09-21 MED ORDER — INSULIN ASPART 100 UNIT/ML ~~LOC~~ SOLN
3.0000 [IU] | Freq: Three times a day (TID) | SUBCUTANEOUS | Status: DC
  Administered 2018-09-21 – 2018-09-22 (×3): 3 [IU] via SUBCUTANEOUS
  Filled 2018-09-21 (×3): qty 1

## 2018-09-21 MED ORDER — ONDANSETRON HCL 4 MG PO TABS: 4.0000 mg | ORAL_TABLET | Freq: Four times a day (QID) | ORAL | Status: DC | PRN

## 2018-09-21 MED ORDER — ATORVASTATIN CALCIUM 20 MG PO TABS
20.0000 mg | ORAL_TABLET | Freq: Every day | ORAL | Status: DC
  Administered 2018-09-21: 20 mg via ORAL
  Filled 2018-09-21: qty 1

## 2018-09-21 NOTE — Progress Notes (Signed)
*  PRELIMINARY RESULTS* Echocardiogram 2D Echocardiogram has been performed.  Madeline [name] [name] 09/21/2018, 12:28 PM

## 2018-09-21 NOTE — Progress Notes (Signed)
[facility] [place] [facility]      PATIENT NAME: Madeline [name]    MR#:  [id number]  DATE OF BIRTH:  [birth year]  SUBJECTIVE:   Patient presented to the hospital due to some dizziness and noted to have significant bradycardia.  Patient's heart rates were down in the low to mid 30s and low 40s this morning.  She was slightly hypotensive but responded some IV fluid boluses.  No other complaints.  REVIEW OF SYSTEMS:    Review of Systems  Constitutional: Negative for chills and fever.  HENT: Negative for congestion and tinnitus.   Eyes: Negative for blurred vision and double vision.  Respiratory: Negative for cough, shortness of breath and wheezing.   Cardiovascular: Negative for chest pain, orthopnea and PND.  Gastrointestinal: Negative for abdominal pain, diarrhea, nausea and vomiting.  Genitourinary: Negative for hematuria.  Neurological: Positive for dizziness. Negative for sensory change and focal weakness.  All other systems reviewed and are negative.   Nutrition: Heart Healthy/Carb control.   Tolerating Diet: Yes Tolerating PT: Await Eval.   DRUG ALLERGIES:   Allergies  Allergen Reactions  . Darvon     "FEEL SICK"  . Propoxyphene Nausea Only and Nausea And Vomiting  . Sulfa Antibiotics Nausea Only and Nausea And Vomiting    "FEEL SICK"     VITALS:  Blood pressure (!) 112/49, pulse (!) 42, temperature 97.9 F (36.6 C), temperature source Oral, resp. rate 19, height 5\' 2"  (1.575 m), weight 49.9 kg, SpO2 98 %.  PHYSICAL EXAMINATION:   Physical Exam  GENERAL:  84 y.o.-year-old patient lying in bed in no acute distress.  EYES: Pupils equal, round, reactive to light and accommodation. No scleral icterus. Extraocular muscles intact.  HEENT: Head atraumatic, normocephalic. Oropharynx and nasopharynx clear.  NECK:  Supple, no jugular venous distention. No thyroid enlargement, no tenderness.  LUNGS: Normal breath sounds bilaterally, no wheezing,  rales, rhonchi. No use of accessory muscles of respiration.  CARDIOVASCULAR: S1, S2 normal. No murmurs, rubs, or gallops.  ABDOMEN: Soft, nontender, nondistended. Bowel sounds present. No organomegaly or mass.  EXTREMITIES: No cyanosis, clubbing or edema b/l.    NEUROLOGIC: Cranial nerves II through XII are intact. No focal Motor or sensory deficits b/l.   PSYCHIATRIC: The patient is alert and oriented x 3.  SKIN: No obvious rash, lesion, or ulcer.    LABORATORY PANEL:   CBC Recent Labs  Lab 09/21/18 0301  WBC 5.5  HGB 10.5*  HCT 33.2*  PLT 40*   ------------------------------------------------------------------------------------------------------------------  Chemistries  Recent Labs  Lab 09/20/18 2231 09/21/18 0301  NA 136 138  K 4.3 4.3  CL 102 105  CO2 27 27  GLUCOSE 289* 255*  BUN 16 15  CREATININE 0.98 0.74  CALCIUM 8.5* 8.1*  MG 2.1  --   AST 119*  --   ALT 77*  --   ALKPHOS 70  --   BILITOT 0.4  --    ------------------------------------------------------------------------------------------------------------------  Cardiac Enzymes Recent Labs  Lab 09/21/18 0916  TROPONINI <0.03   ------------------------------------------------------------------------------------------------------------------  RADIOLOGY:  Dg Chest 2 View  Result Date: 09/20/2018 CLINICAL DATA:  Shortness of breath today. EXAM: CHEST - 2 VIEW COMPARISON:  CT chest 08/08/2018. Chest 09/10/2016. FINDINGS: Borderline heart size with normal pulmonary vascularity. Calcification in the mitral valve annulus. No airspace disease or consolidation in the lungs. Slight fibrosis in the lung bases. Mediastinal contours appear intact. Calcification of the aorta. IMPRESSION: No evidence of active pulmonary disease. Electronically Signed  By: Burman Nieves M.D.   On: 09/20/2018 22:19     ASSESSMENT AND PLAN:   84 year old female with past medical history of hypertension, hypothyroidism,  diabetes, depression, hyperlipidemia, history of coronary artery disease, history of ITP who presented to the hospital due to dizziness and noted to have symptomatic bradycardia.  1.  Bradycardia-patient's heart rates were as low as the mid 30s on admission and this morning also in the low 40s.  She was slightly hypotensive but responded to some IV fluids. - Patient was taken off her metoprolol, ambulated and her heart rates have improved. -This is likely sick sinus syndrome but patient is on beta-blockers and will await washout prior to considering pacemaker placement.  Patient is presently hemodynamically stable. -Await echocardiogram results.  Appreciate cardiology input.  2.  History of coronary artery disease-patient has no acute chest pain presently.  Cardiac markers have been negative.  Await echocardiogram results. -Continue atorvastatin.  3.  Diabetes type 2 without complication-continue Lantus, NovoLog with meals with sliding scale insulin coverage.  4.  Hyperlipidemia-continue atorvastatin.  5.  Depression-continue Celexa.  6.  History of chronic ITP-platelet count is at baseline, no acute bleeding.  Will follow platelet counts.     All the records are reviewed and case discussed with Care Management/Social Worker. Management plans discussed with the patient, family and they are in agreement.  CODE STATUS: Full code  DVT Prophylaxis: Ted's & SCD's.   TOTAL TIME TAKING CARE OF THIS PATIENT: 30 minutes.   POSSIBLE D/C IN 1-2 DAYS, DEPENDING ON CLINICAL CONDITION.   Houston Siren M.D on 09/21/2018 at 2:45 PM  Between 7am to 6pm - Pager - [phone]  After 6pm go to [email or web address] - Social research officer, government  [facility] [place] [facility]  Office  [phone]  CC: Primary care physician; Marguarite Arbour, MD

## 2018-09-21 NOTE — Progress Notes (Addendum)
Pt arrived from ED alert and oriented. Pt has insulin pump that she took off and gave to husband. MD made aware. Insulin contract at bedside if needed. Pt states she is aware of sliding scale and insulin but may need reinforcement on insulin education.

## 2018-09-21 NOTE — Consult Note (Signed)
Cardiology Consultation:   Patient ID: [name]: [id number]; DOB: Nov 28, 1933  Admit date: 09/20/2018 Date of Consult: 09/21/2018  Primary Care Provider: Idelle Crouch, MD Primary Cardiologist:[facility], Dr. Rockey Situ Primary Electrophysiologist:  None    Patient Profile:   Madeline [name] is a 84 y.o. female with a hx of CAD s/p STEMI 07/20/2011 with occlusion of RCA and PTCA with DES to the pRCA, DM on home insulin pump, HLD, hypothyroid, HTN, ITP, and pulmonary nodules who is being seen today for the evaluation of chest pain and SOB at the request of Dr. Jannifer Franklin.  History of Present Illness:   Madeline [name] is an 84 yo female with PMH as above. She is a current smoker and reports 1 pack daily for 30+ years. Family history significant for a mother with "heart problems" and multiple family members with cancer.   07/20/2011: STEMI with occlusion to RCA and underwent LHC with coronary angiography resulting in DES to the proximal RCA. She was found to have right dominant system with dual PDAs and other vessel irregularities as per report under [employer] tab.   07/2018: She last was seen in the office on 08/12/2018 by Dr. Dorris Carnes with reports of occasional L sided CP that she thought might be 2/2 gas. She reported leg weakness. At that time, per Dr. Harrington Challenger' documentation, it was thought the L sided pain was not d/t angina and plan was for RTC in 05/2019.  09/2018: She reported SOB 09/20/2018 and R sided chest pain that started while moving furniture around with her husband in the morning. Per patient report, this chest pain was located under her right breast; however, on exam, the chest pain appears more epigastric in location. She reportedly stopped helping her husband move this furniture and rested with some improvement but still noted continued epigastric / chest pain. Patient reported medication compliance with last metoprolol dosage taken at home that night (11/5). PTA medications  documented as Lipitor 63m, lisinopril 123mdaily, lopressor 25 mg bid.   The evening of 09/20/2018, [facility] continued epigastric pain and associated SOB, fatigue, n/v, and general weakness. In the ED, she was found to bradycardic with HR in the 130s & hypotensive with SBP 70-80s. Initial EKG showing a junctional rhythm with sinus pauses. Liver enzymes and glucose were notably elevated. She was placed on continuous pulse ox and telemetry for monitoring, as well as pacer pads.   In the ED 09/20/18 Vitals: HR 40, BP 87/39, RR 17, SpO2 97% Labs: Na 136, K 4.3, Mg 2.1,glucose 289, Cr 0.98, BUN 16, Ca 8.5, Albumin 3.4, AST 119, ALT 77, Alk phosphatase 70, WBC 6.4, RBC 3.66, Hgb 11.4, plts 43 Troponin negative x2 TSH 1.850 EKG: Sinus bradycardia, Junctional escape rhythm with PVCs/ sinus pauses. QRS 110.  CXR: No active cardiopulmonary disease. Borderline heart size and calcification in the mitral valve annulus. Slight fibrosis of lung bases, calcification of the aorta  She was admitted for further evaluation and management with cardiology consulted.   Today, 11/6 & on exam, she denies abdominal pain and reports chest pain; however, she points to her abdomen as the location of the pain.  She was ambulated with HR increasing into the 80s and BP increasing to SBP in 110s.  Also of note, the patient is reportedly still using her insulin pump. Per ED documentation, patient had previously taken off her insulin pump, turned it off, and given it to her husband with insulin contract  at bedside. Per diabetes coordinator, insulin pump off. However, patient is still using her insulin pump while on the telemetry floor and may require reeducation.  Pending echo.  Past Medical History:  Diagnosis Date  . Diabetes mellitus   . Dyslipidemia   . Hyperthyroidism     Past Surgical History:  Procedure Laterality Date  . BREAST BIOPSY Right 1967   EXCISIONAL - NEG  . no surgical hx        Home Medications:  Prior to Admission medications   Medication Sig Start Date End Date Taking? Authorizing Provider  atorvastatin (LIPITOR) 20 MG tablet TAKE 1 TABLET BY MOUTH ONCE DAILY *PLEASE  KEEP  UPCOMING  APPOINTMENT  FOR  FUTURE  REFILLS* Patient taking differently: Take 20 mg by mouth at bedtime.  11/18/17  Yes Fay Records, MD  cetirizine (ZYRTEC) 10 MG tablet Take 10 mg by mouth daily.   Yes [provider]  citalopram (CELEXA) 20 MG tablet Take 20 mg by mouth daily.     Yes [provider]  Insulin Human (INSULIN PUMP) SOLN Inject 1 each into the skin continuous. Used with Novolog insulin   Yes [provider]  levothyroxine (SYNTHROID, LEVOTHROID) 50 MCG tablet Take 50 mcg by mouth daily before breakfast.  05/16/18  Yes [provider]  lisinopril (PRINIVIL,ZESTRIL) 10 MG tablet Take 10 mg by mouth daily.  10/08/14  Yes [provider]  metoprolol tartrate (LOPRESSOR) 25 MG tablet TAKE 1 TABLET BY MOUTH TWICE DAILY Patient taking differently: Take 25 mg by mouth 2 (two) times daily.  01/10/18  Yes Fay Records, MD  Multiple Vitamin (MULTIVITAMIN WITH MINERALS) TABS tablet Take 1 tablet by mouth daily.   Yes [provider]  NOVOLOG 100 UNIT/ML injection Inject 0-50 Units into the skin continuous. Up to 50 units/ day via pump 12/08/17  Yes [provider]    Inpatient Medications: Scheduled Meds: . atorvastatin  20 mg Oral QHS  . citalopram  20 mg Oral Daily  . insulin aspart  0-5 Units Subcutaneous QHS  . insulin aspart  0-9 Units Subcutaneous TID WC  . insulin aspart  3 Units Subcutaneous TID WC  . insulin glargine  12 Units Subcutaneous Daily  . levothyroxine  50 mcg Oral QAC breakfast   Continuous Infusions:  PRN Meds: acetaminophen **OR** acetaminophen, ondansetron **OR** ondansetron (ZOFRAN) IV  Allergies:    Allergies  Allergen Reactions  . Darvon     "FEEL SICK"  . Propoxyphene Nausea Only and  Nausea And Vomiting  . Sulfa Antibiotics Nausea Only and Nausea And Vomiting    "FEEL SICK"     Social History:   Social History   Socioeconomic History  . Marital status: Married    Spouse name: Not on file  . Number of children: Not on file  . Years of education: Not on file  . Highest education level: Not on file  Occupational History  . Not on file  Social Needs  . Financial resource strain: Not on file  . Food insecurity:    Worry: Not on file    Inability: Not on file  . Transportation needs:    Medical: Not on file    Non-medical: Not on file  Tobacco Use  . Smoking status: Current Every Day Smoker    Packs/day: 1.00    Years: 50.00    Pack years: 50.00    Types: Cigarettes  . Smokeless tobacco: Never Used  Substance and Sexual Activity  .  Alcohol use: No  . Drug use: No  . Sexual activity: Not on file  Lifestyle  . Physical activity:    Days per week: Not on file    Minutes per session: Not on file  . Stress: Not on file  Relationships  . Social connections:    Talks on phone: Not on file    Gets together: Not on file    Attends religious service: Not on file    Active member of club or organization: Not on file    Attends meetings of clubs or organizations: Not on file    Relationship status: Not on file  . Intimate partner violence:    Fear of current or ex partner: Not on file    Emotionally abused: Not on file    Physically abused: Not on file    Forced sexual activity: Not on file  Other Topics Concern  . Not on file  Social History Narrative  . Not on file    Family History:    Family History  Problem Relation Age of Onset  . Heart Problems Mother   . Other Father        UNK  . Cancer Sister        BREAST  . Breast cancer Sister 44  . Cancer Sister        BREAST  . Breast cancer Sister 67  . Cancer Sister        BREAST  . Diabetes Son      ROS:  Please see the history of present illness.   All other ROS reviewed and  negative.     Physical Exam/Data:   Vitals:   09/21/18 0559 09/21/18 0723 09/21/18 0725 09/21/18 0932  BP: (!) 105/40 (!) 86/51 (!) 72/59 (!) 112/49  Pulse: (!) 42 84 (!) 40 (!) 42  Resp:  19    Temp:  97.9 F (36.6 C)    TempSrc:  Oral    SpO2:  98%    Weight:      Height:        Intake/Output Summary (Last 24 hours) at 09/21/2018 1128 Last data filed at 09/21/2018 0358 Gross per 24 hour  Intake 1069.16 ml  Output -  Net 1069.16 ml   Filed Weights   09/20/18 2145  Weight: 49.9 kg   Body mass index is 20.12 kg/m.  General: Frail elderly woman, accompanied by her husband in NAD HEENT: normal Neck: no JVP Vascular: No carotid bruits; FA pulses 2+ bilaterally without bruits  Cardiac:  Bradycardic with ectopic beats Lungs: Bilateral expiratory wheezing, no rhonchi or rales  Abd: soft, nontender, no hepatomegaly  Ext: no edema Musculoskeletal:  No deformities Skin: warm and dry  Neuro: no focal abnormalities noted Psych:  Normal affect   EKG:  The EKG was personally reviewed and demonstrates:  As in HPI with additional EKG ordered  Telemetry:  Telemetry was personally reviewed and demonstrates:  Junctional escape rhythm with PVCs, bradycardic with rates 38-40s  Relevant CV Studies: Pending echo  *LHC scanned and under chart review  media  07/20/2011  Laboratory Data:  Chemistry Recent Labs  Lab 09/20/18 2231 09/21/18 0301  NA 136 138  K 4.3 4.3  CL 102 105  CO2 27 27  GLUCOSE 289* 255*  BUN 16 15  CREATININE 0.98 0.74  CALCIUM 8.5* 8.1*  GFRNONAA 52* >60  GFRAA >60 >60  ANIONGAP 7 6    Recent Labs  Lab 09/20/18 2231  PROT 5.7*  ALBUMIN 3.4*  AST 119*  ALT 77*  ALKPHOS 70  BILITOT 0.4   Hematology Recent Labs  Lab 09/20/18 2231 09/21/18 0301  WBC 6.4 5.5  RBC 3.66* 3.47*  HGB 11.4* 10.5*  HCT 34.2* 33.2*  MCV 93.4 95.7  MCH 31.1 30.3  MCHC 33.3 31.6  RDW 12.5 12.6  PLT 43* 40*   Cardiac Enzymes Recent Labs  Lab 09/20/18 2231  09/21/18 0916  TROPONINI <0.03 <0.03   No results for input(s): TROPIPOC in the last 168 hours.  BNPNo results for input(s): BNP, PROBNP in the last 168 hours.  DDimer No results for input(s): DDIMER in the last 168 hours.  Radiology/Studies:  Dg Chest 2 View  Result Date: 09/20/2018 CLINICAL DATA:  Shortness of breath today. EXAM: CHEST - 2 VIEW COMPARISON:  CT chest 08/08/2018. Chest 09/10/2016. FINDINGS: Borderline heart size with normal pulmonary vascularity. Calcification in the mitral valve annulus. No airspace disease or consolidation in the lungs. Slight fibrosis in the lung bases. Mediastinal contours appear intact. Calcification of the aorta. IMPRESSION: No evidence of active pulmonary disease. Electronically Signed   By: Lucienne Capers M.D.   On: 09/20/2018 22:19    Assessment and Plan:   1. Sinus bradycardia v SSS - PTA medications include home metoprolol BID, last taken 09/20/2018 - Pending echo. Ordered repeat EKG, confirming junctional rhythm with PVCs and sinus pauses. Ordered troponin and negative x2. Daily CBC and BMET. Started on heart healthy carb modified diet as no plan for ischemic workup today. -  Holding metoprolol for 24h washout. Given patient ambulated with elevation in HR and BP and response to saline bolus, we will plan to monitor overnight for improvement with  blocker washout. At this time, it does not appear that she is in urgent need for a pacemaker and pacing pads have been removed. - Ddx includes underlying SSS, exacerbated by  blocker with last dose 11/5 in the PM. Will reassess vitals tomorrow and consider EP consultation. For now, avoid  blockers, CCB, ACEi, AV nodal blocking agents or rate reducing medications. It is likely that her current condition is exacerbated by dehydration as HR and BP did improve with NS bolus and Cr initially somewhat elevated but now improved 0.98  0.74. Pending results of echocardiogram, will administer more fluids given SBP  did improve with fluids.     2. Atypical Chest pain with h/o CAD s/p STEMI 2012 - No chest pain. Epigastric, R flank pain reported and when moving furniture. - [place] 2012 with occlusion of RCA and PTCA with DES to the pRCA - Troponin negative x2. Pending echo - Hold lopressor for 24 hour washout. Hold lisinopril. - Continue statin - No ischemic workup at this time given atypical chest pain with negative cardiac enzymes.  3. HLD - Last LDL 07/2018 of 43 and at goal <70 - Liver enzymes slightly elevated at presentation.  - Continue Lipitor with monitoring of liver enzymes  4. HTN - Currently hypotensive - Monitor vitals   5. DM2 on home insulin pump - See HPI. Patient may be using insulin pump despite signing insulin contract, turning off pump, and handing the pump to her husband in the ED. - Heart Healthy diet - Per IM  6. Smoker with pulmonary nodules  - Smoking cessation counseling provided  - Per IM  7. Remainder per IM    For questions or updates, please contact [place] Please consult [email or web address] for contact info under     Signed, Robin Searing  Mickle Plumb, PA-C  09/21/2018 11:28 AM

## 2018-09-21 NOTE — Progress Notes (Signed)
Inpatient Diabetes Program Recommendations  AACE/ADA: New Consensus Statement on Inpatient Glycemic Control (2015)  Target Ranges:  Prepandial:   less than 140 mg/dL      Peak postprandial:   less than 180 mg/dL (1-2 hours)      Critically ill patients:  140 - 180 mg/dL   Lab Results  Component Value Date   GLUCAP 192 (H) 09/21/2018   HGBA1C 9.0 (H) 08/12/2018    Spoke with patient briefly.  She is pleased with her blood sugar at this time.  We briefly discussed that when insulin pump is restarted she will need new supplies to restart her site.  Also reminded patient that she has had Lantus today and therefore should not restart insulin pump until tomorrow.  Patient seemed slightly confused about this. Discussed with RN and will follow up on 11/7 to make sure that transition is appropriate. Reminded patient that RN will be giving her insulin while in the hospital.   --If patient is to restart insulin pump tomorrow, will need to d/c Lantus.   Thanks,  Beryl Meager, RN, BC-ADM Inpatient Diabetes Coordinator Pager [phone] (8a-5p)

## 2018-09-21 NOTE — H&P (Signed)
[facility] - [place] at [facility]   PATIENT NAME: Madeline [name]    MR#:  [id number]  DATE OF BIRTH:  April 20, 1934  DATE OF ADMISSION:  09/20/2018  PRIMARY CARE PHYSICIAN: Marguarite Arbour, MD   REQUESTING/REFERRING PHYSICIAN: Roxan Hockey, MD  CHIEF COMPLAINT:   Chief Complaint  Patient presents with  . Shortness of Breath    HISTORY OF PRESENT ILLNESS:  Madeline [name]  is a 84 y.o. female who presents with chief complaint as above.  She presents the ED with complaint of fatigue, nausea and vomiting, general weakness.  Here she is found to be significant bradycardic.  This is new for her.  She does have a history of CAD with stent placement several years ago.  She is persistently bradycardic in the ED mother her other cardiac work-up is largely within normal limits, and hospitalist were called for admission and further evaluation  PAST MEDICAL HISTORY:   Past Medical History:  Diagnosis Date  . Diabetes mellitus   . Dyslipidemia   . Hyperthyroidism      PAST SURGICAL HISTORY:   Past Surgical History:  Procedure Laterality Date  . BREAST BIOPSY Right 1967   EXCISIONAL - NEG  . no surgical hx       SOCIAL HISTORY:   Social History   Tobacco Use  . Smoking status: Current Every Day Smoker    Packs/day: 1.00    Years: 50.00    Pack years: 50.00    Types: Cigarettes  . Smokeless tobacco: Never Used  Substance Use Topics  . Alcohol use: No     FAMILY HISTORY:   Family History  Problem Relation Age of Onset  . Heart Problems Mother   . Other Father        UNK  . Cancer Sister        BREAST  . Breast cancer Sister 61  . Cancer Sister        BREAST  . Breast cancer Sister 70  . Cancer Sister        BREAST  . Diabetes Son      DRUG ALLERGIES:   Allergies  Allergen Reactions  . Darvon     "FEEL SICK"  . Propoxyphene Nausea Only and Nausea And Vomiting  . Sulfa Antibiotics Nausea Only and Nausea And Vomiting    "FEEL SICK"      MEDICATIONS AT HOME:   Prior to Admission medications   Medication Sig Start Date End Date Taking? Authorizing Provider  atorvastatin (LIPITOR) 20 MG tablet TAKE 1 TABLET BY MOUTH ONCE DAILY *PLEASE  KEEP  UPCOMING  APPOINTMENT  FOR  FUTURE  REFILLS* Patient taking differently: Take 20 mg by mouth at bedtime.  11/18/17  Yes Pricilla Riffle, MD  cetirizine (ZYRTEC) 10 MG tablet Take 10 mg by mouth daily.   Yes [provider]  citalopram (CELEXA) 20 MG tablet Take 20 mg by mouth daily.     Yes [provider]  Insulin Human (INSULIN PUMP) SOLN Inject 1 each into the skin continuous. Used with Novolog insulin   Yes [provider]  levothyroxine (SYNTHROID, LEVOTHROID) 50 MCG tablet Take 50 mcg by mouth daily before breakfast.  05/16/18  Yes [provider]  lisinopril (PRINIVIL,ZESTRIL) 10 MG tablet Take 10 mg by mouth daily.  10/08/14  Yes [provider]  metoprolol tartrate (LOPRESSOR) 25 MG tablet TAKE 1 TABLET BY MOUTH TWICE DAILY Patient taking differently: Take 25 mg by mouth 2 (two)  times daily.  01/10/18  Yes Pricilla Riffle, MD  Multiple Vitamin (MULTIVITAMIN WITH MINERALS) TABS tablet Take 1 tablet by mouth daily.   Yes [provider]  NOVOLOG 100 UNIT/ML injection Inject 0-50 Units into the skin continuous. Up to 50 units/ day via pump 12/08/17  Yes [provider]    REVIEW OF SYSTEMS:  Review of Systems  Constitutional: Positive for malaise/fatigue. Negative for chills, fever and weight loss.  HENT: Negative for ear pain, hearing loss and tinnitus.   Eyes: Negative for blurred vision, double vision, pain and redness.  Respiratory: Negative for cough, hemoptysis and shortness of breath.   Cardiovascular: Negative for chest pain, palpitations, orthopnea and leg swelling.  Gastrointestinal: Positive for nausea and vomiting. Negative for abdominal pain, constipation and diarrhea.  Genitourinary: Negative for dysuria,  frequency and hematuria.  Musculoskeletal: Negative for back pain, joint pain and neck pain.  Skin:       No acne, rash, or lesions  Neurological: Positive for weakness. Negative for dizziness, tremors and focal weakness.  Endo/Heme/Allergies: Negative for polydipsia. Does not bruise/bleed easily.  Psychiatric/Behavioral: Negative for depression. The patient is not nervous/anxious and does not have insomnia.      VITAL SIGNS:   Vitals:   09/20/18 2300 09/20/18 2330 09/21/18 0000 09/21/18 0030  BP: (!) 87/39 (!) 117/55 (!) 95/57 (!) 139/48  Pulse: (!) 40 (!) 36 73   Resp: 17 19 18 17   Temp:      TempSrc:      SpO2: 97% 98% 98%   Weight:      Height:       Wt Readings from Last 3 Encounters:  09/20/18 49.9 kg  08/12/18 50.3 kg  06/13/18 48.7 kg    PHYSICAL EXAMINATION:  Physical Exam  Vitals reviewed. Constitutional: She is oriented to person, place, and time. She appears well-developed and well-nourished. No distress.  HENT:  Head: Normocephalic and atraumatic.  Mouth/Throat: Oropharynx is clear and moist.  Eyes: Pupils are equal, round, and reactive to light. Conjunctivae and EOM are normal. No scleral icterus.  Neck: Normal range of motion. Neck supple. No JVD present. No thyromegaly present.  Cardiovascular: Regular rhythm and intact distal pulses. Exam reveals no gallop and no friction rub.  No murmur heard. Bradycardic  Respiratory: Effort normal and breath sounds normal. No respiratory distress. She has no wheezes. She has no rales.  GI: Soft. Bowel sounds are normal. She exhibits no distension. There is no tenderness.  Musculoskeletal: Normal range of motion. She exhibits no edema.  No arthritis, no gout  Lymphadenopathy:    She has no cervical adenopathy.  Neurological: She is alert and oriented to person, place, and time. No cranial nerve deficit.  No dysarthria, no aphasia  Skin: Skin is warm and dry. No rash noted. No erythema.  Psychiatric: She has a normal  mood and affect. Her behavior is normal. Judgment and thought content normal.    LABORATORY PANEL:   CBC Recent Labs  Lab 09/20/18 2231  WBC 6.4  HGB 11.4*  HCT 34.2*  PLT 43*   ------------------------------------------------------------------------------------------------------------------  Chemistries  Recent Labs  Lab 09/20/18 2231  NA 136  K 4.3  CL 102  CO2 27  GLUCOSE 289*  BUN 16  CREATININE 0.98  CALCIUM 8.5*  MG 2.1  AST 119*  ALT 77*  ALKPHOS 70  BILITOT 0.4   ------------------------------------------------------------------------------------------------------------------  Cardiac Enzymes Recent Labs  Lab 09/20/18 2231  TROPONINI <0.03   ------------------------------------------------------------------------------------------------------------------  RADIOLOGY:  Dg Chest 2 View  Result Date: 09/20/2018 CLINICAL DATA:  Shortness of breath today. EXAM: CHEST - 2 VIEW COMPARISON:  CT chest 08/08/2018. Chest 09/10/2016. FINDINGS: Borderline heart size with normal pulmonary vascularity. Calcification in the mitral valve annulus. No airspace disease or consolidation in the lungs. Slight fibrosis in the lung bases. Mediastinal contours appear intact. Calcification of the aorta. IMPRESSION: No evidence of active pulmonary disease. Electronically Signed   By: Burman Nieves M.D.   On: 09/20/2018 22:19    EKG:   Orders placed or performed during the hospital encounter of 09/20/18  . ED EKG  . ED EKG  . EKG 12-Lead  . EKG 12-Lead    IMPRESSION AND PLAN:  Principal Problem:   Bradycardia -unclear etiology, concern for possible tachybradycardia syndrome, may be sick sinus syndrome.  She is symptomatic.  Admit to telemetry, get cardiology consult.  Currently her heart rate is dipping down only into the 40s.  She become symptomatic or starts to drop significantly into the 30s could use some atropine Active Problems:   CAD (coronary artery disease)  -continue home meds, other work-up as above   Chronic ITP (idiopathic thrombocytopenia) (HCC) -chronic thrombocytopenia, hold anticoagulants, SCDs for DVT prophylaxis   Diabetes (HCC) -sliding scale insulin with corresponding glucose checks   Dyslipidemia -Home dose antilipid  Chart review performed and case discussed with ED provider. Labs, imaging and/or ECG reviewed by provider and discussed with patient/family. Management plans discussed with the patient and/or family.  DVT PROPHYLAXIS: SubQ lovenox   GI PROPHYLAXIS:  None  ADMISSION STATUS: Inpatient     CODE STATUS: Full  TOTAL TIME TAKING CARE OF THIS PATIENT: 45 minutes.   Addi Pak FIELDING 09/21/2018, 1:28 AM  [employer]  [phone]  CC: Primary care physician; Marguarite Arbour, MD  Note:  This document was prepared using Dragon voice recognition software and may include unintentional dictation errors.

## 2018-09-21 NOTE — Plan of Care (Signed)
  Problem: Clinical Measurements: Goal: Ability to maintain clinical measurements within normal limits will improve Outcome: Not Progressing Note:  H.R. this AM = only 38. B.P. = only 72 systolic at that time as well. See new orders for fluid bolus. H.R. & B.P. improved at this time. Will continue to monitor hemodynamic parameters for remainder of shift. Jari Favre [facility]

## 2018-09-21 NOTE — Progress Notes (Signed)
Attending Note Patient seen and examined, agree with detailed note above,  Patient presentation and plan discussed on rounds.  Cardiology consult placed by Dr.  Anne Hahn Reason for consult: Chest pain, bradycardia  EKG lab work, chest x-ray, echocardiogram reviewed independently by myself  Madeline [name] is a 84 year old woman with past medical history of smoking,  Diabetes, hypothyroid, coronary artery disease with prior stent presenting with shortness of breath and chest pain  She reports that she was moving furniture yesterday with her husband, developed some upper epigastric discomfort acutely, had to stop, he had to finish moving the furniture. "  It was heavy" Symptoms persisted and she presented to the emergency room Also reported having some shortness of breath on arrival " Wanted to make sure she did not have pneumonia", felt weak  placed on telemetry heart rate in the 40s noted Vitals noted from the emergency room was hypotensive systolic pressure in the 80s with pulse 40, 1 recording pulse 36  She was admitted overnight Last dose of metoprolol yesterday evening prior to going to the emergency room  Review of Systems  Constitutional: Positive for malaise/fatigue. Negative for chills, fever and weight loss.  HENT: Negative for ear pain, hearing loss and tinnitus.   Eyes: Negative for blurred vision, double vision, pain and redness.  Respiratory: +shortness of breath.   Cardiovascular: + chest pain, no palpitations, orthopnea and leg swelling.  Gastrointestinal: Positive for nausea and vomiting. Negative for abdominal pain, constipation and diarrhea.  Genitourinary: Negative for dysuria, frequency and hematuria.  Musculoskeletal: Negative for back pain, joint pain and neck pain.  Skin:  Normal, warm Neurological: Positive for weakness. Negative for dizziness, tremors and focal weakness.  Endo/Heme/Allergies: Negative for polydipsia. Psychiatric/Behavioral: Negative for depression.    On examination she is well-appearing sitting up no distress Shortness of breath chest pain resolved, no JVD lungs clear to auscultation bilaterally heart sounds bradycardic, ectopy, abdomen soft nontender, no reproducible flank pain no significant lower extremity edema, alert and oriented, grossly normal neurologic and musculoskeletal exam  Lab work reviewed showing potassium 4.3 creatinine 0.74 normal troponin x2 LDH 170 total cholesterol 112 hematocrit 33  EKG personally reviewed by myself showing marked sinus bradycardia, junctional escape rhythm, QRS 110 Madeline, PVCs  A/P: Sick sinus syndrome/marked sinus bradycardia Underlying sick sinus syndrome, exacerbated by metoprolol Recommend holding metoprolol, needs additional 24 hours washout, last dose was yesterday evening, will somewhat bradycardic but improving Would avoid beta-blockers, calcium channel blockers Bradycardia possibly contributing to hypotension on arrival, did not appear dehydrated/prerenal Though blood pressure did improve with fluids -Currently asymptomatic, no urgent referral for pacemaker needed, continue to monitor  Chest pain Describes it more like upper epigastric pain or right flank pain Somewhat of a poor historian Currently not having any symptoms Seem to be brought on by moving furniture, acutely Cardiac enzymes negative No ischemic work-up at this time given atypical nature Close follow-up in clinic  CAD, prior stent As above cardiac enzymes negative, echocardiogram pending No further ischemic work-up at this time given symptom-free  Hyperlipidemia At goal  Greater than 50% was spent in counseling and coordination of care with patient Total encounter time 110 minutes or more   Signed: Dossie [name]  M.D., Ph.D. [facility]

## 2018-09-21 NOTE — Progress Notes (Signed)
Inpatient Diabetes Program Recommendations  AACE/ADA: New Consensus Statement on Inpatient Glycemic Control (2015)  Target Ranges:  Prepandial:   less than 140 mg/dL      Peak postprandial:   less than 180 mg/dL (1-2 hours)      Critically ill patients:  140 - 180 mg/dL   Lab Results  Component Value Date   GLUCAP 192 (H) 09/21/2018   HGBA1C 9.0 (H) 08/12/2018   Late entry: Insulin pump settings:  Basal settings 12 AM 0.55 units/hr 6 am 0.7 units/hr 8 am 0.6 units/hr 5 pm 0.7 units/hr 24-hr basal = 15 units NovoLog   Bolus settings I:C 12 am 17, 11 am 15, 2 pm 12 Sensitivity 58 Target 100-120 Active insulin time 5 hrs  Diabetes history: Type 1 DM  Outpatient Diabetes medications: Insulin pump Current orders for Inpatient glycemic control:  Novolog sensitive tid with meals and HS  Inpatient Diabetes Program Recommendations:    Per documentation, insulin pump is off.  Sent secure message to MD requesting Lantus 12 units daily (start now) and Novolog meal coverage 3 units tid with meals. Patient see's Dr. Tedd Sias.  Will see patient today.   Thanks,  Beryl Meager, RN, BC-ADM Inpatient Diabetes Coordinator Pager [phone] (8a-5p)

## 2018-09-22 DIAGNOSIS — E785 Hyperlipidemia, unspecified: Secondary | ICD-10-CM

## 2018-09-22 DIAGNOSIS — Z794 Long term (current) use of insulin: Secondary | ICD-10-CM

## 2018-09-22 DIAGNOSIS — E119 Type 2 diabetes mellitus without complications: Secondary | ICD-10-CM

## 2018-09-22 LAB — CBC WITH DIFFERENTIAL/PLATELET
Abs Immature Granulocytes: 0.01 10*3/uL (ref 0.00–0.07)
Basophils Absolute: 0 10*3/uL (ref 0.0–0.1)
Basophils Relative: 1 %
Eosinophils Absolute: 0.1 10*3/uL (ref 0.0–0.5)
Eosinophils Relative: 3 %
HCT: 35.4 % — ABNORMAL LOW (ref 36.0–46.0)
Hemoglobin: 10.7 g/dL — ABNORMAL LOW (ref 12.0–15.0)
Immature Granulocytes: 0 %
Lymphocytes Relative: 45 %
Lymphs Abs: 1.9 10*3/uL (ref 0.7–4.0)
MCH: 30.8 pg (ref 26.0–34.0)
MCHC: 30.2 g/dL (ref 30.0–36.0)
MCV: 102 fL — ABNORMAL HIGH (ref 80.0–100.0)
Monocytes Absolute: 0.4 10*3/uL (ref 0.1–1.0)
Monocytes Relative: 9 %
Neutro Abs: 1.8 10*3/uL (ref 1.7–7.7)
Neutrophils Relative %: 42 %
Platelets: 42 10*3/uL — ABNORMAL LOW (ref 150–400)
RBC: 3.47 MIL/uL — ABNORMAL LOW (ref 3.87–5.11)
RDW: 12.6 % (ref 11.5–15.5)
WBC: 4.3 10*3/uL (ref 4.0–10.5)
nRBC: 0 % (ref 0.0–0.2)

## 2018-09-22 LAB — BASIC METABOLIC PANEL
Anion gap: 6 (ref 5–15)
BUN: 10 mg/dL (ref 8–23)
CO2: 26 mmol/L (ref 22–32)
Calcium: 8.1 mg/dL — ABNORMAL LOW (ref 8.9–10.3)
Chloride: 110 mmol/L (ref 98–111)
Creatinine, Ser: 0.58 mg/dL (ref 0.44–1.00)
GFR calc Af Amer: >60 mL/min (ref >60)
GFR calc non Af Amer: >60 mL/min (ref >60)
Glucose, Bld: 139 mg/dL — ABNORMAL HIGH (ref 70–99)
Potassium: 4.3 mmol/L (ref 3.5–5.1)
Sodium: 142 mmol/L (ref 135–145)

## 2018-09-22 LAB — GLUCOSE, CAPILLARY: Glucose-Capillary: 147 mg/dL — ABNORMAL HIGH (ref 70–99)

## 2018-09-22 MED ORDER — LISINOPRIL 20 MG PO TABS: 20.0000 mg | ORAL_TABLET | Freq: Every day | ORAL | 1 refills | Status: DC

## 2018-09-22 NOTE — Discharge Summary (Signed)
[employer] [facility] [place] [facility]   PATIENT NAME: Madeline [name]    MR#:  [id number]  DATE OF BIRTH:  Jan 05, 1934  DATE OF ADMISSION:  09/20/2018 ADMITTING PHYSICIAN: Oralia Manis, MD  DATE OF DISCHARGE: 09/22/2018 11:01 AM  PRIMARY CARE PHYSICIAN: Marguarite Arbour, MD    ADMISSION DIAGNOSIS:  Symptomatic bradycardia [R00.1]  DISCHARGE DIAGNOSIS:  Principal Problem:   Bradycardia Active Problems:   CAD (coronary artery disease)   Dyslipidemia   Chronic ITP (idiopathic thrombocytopenia) (HCC)   Diabetes (HCC)   SECONDARY DIAGNOSIS:   Past Medical History:  Diagnosis Date  . Diabetes mellitus   . Dyslipidemia   . Hyperthyroidism     HOSPITAL COURSE:   84 year old female with past medical history of hypertension, hypothyroidism, diabetes, depression, hyperlipidemia, history of coronary artery disease, history of ITP who presented to the hospital due to dizziness and noted to have symptomatic bradycardia.  1.  Bradycardia- and presented to the hospital with significant bradycardia with heart rates in the 30s to 40s.  She was on beta-blockers and those were stopped.  Patient was observed on telemetry and after having her beta-blocker stopped and patient ambulating her heart rate has improved and remained stable in the 60s to 70s.  Cardiology was consulted and they did not recommend any pacemaker placement at this point. -Patient's echocardiogram showed normal ejection fraction with no wall motion abnormalities.  -Patient is now being discharged home with outpatient follow-up with cardiology.  Patient's bradycardia has now resolved.   2.  History of coronary artery disease-patient has no acute chest pain presently.  Cardiac markers have been negative.    Echocardiogram showed normal ejection fraction with no regional wall motion abnormalities. -Continue atorvastatin.  3.  Essential hypertension-patient will continue her lisinopril and her dose was increased as  patient's metoprolol has not been stopped.  4.  Diabetes type 2 without complication- on the hospital patient was on Lantus and sliding scale insulin but she will resume her insulin pump upon discharge.    5.  Hyperlipidemia- pt. Will cont. atorvastatin.  6.  Depression-continue Celexa.  7.  History of chronic ITP-platelet count is at baseline and can be further followed as outpatient.   DISCHARGE CONDITIONS:   Stable.   CONSULTS OBTAINED:  Treatment Team:  Antonieta Iba, MD  DRUG ALLERGIES:   Allergies  Allergen Reactions  . Darvon     "FEEL SICK"  . Propoxyphene Nausea Only and Nausea And Vomiting  . Sulfa Antibiotics Nausea Only and Nausea And Vomiting    "FEEL SICK"     DISCHARGE MEDICATIONS:   Allergies as of 09/22/2018      Reactions   Darvon    "FEEL SICK"   Propoxyphene Nausea Only, Nausea And Vomiting   Sulfa Antibiotics Nausea Only, Nausea And Vomiting   "FEEL SICK"       Medication List    STOP taking these medications   metoprolol tartrate 25 MG tablet Commonly known as:  LOPRESSOR     TAKE these medications   atorvastatin 20 MG tablet Commonly known as:  LIPITOR TAKE 1 TABLET BY MOUTH ONCE DAILY *PLEASE  KEEP  UPCOMING  APPOINTMENT  FOR  FUTURE  REFILLS* What changed:  See the new instructions.   cetirizine 10 MG tablet Commonly known as:  ZYRTEC Take 10 mg by mouth daily.   citalopram 20 MG tablet Commonly known as:  CELEXA Take 20 mg by mouth daily.   insulin pump Soln Inject 1  each into the skin continuous. Used with Novolog insulin   levothyroxine 50 MCG tablet Commonly known as:  SYNTHROID, LEVOTHROID Take 50 mcg by mouth daily before breakfast.   lisinopril 20 MG tablet Commonly known as:  PRINIVIL,ZESTRIL Take 1 tablet (20 mg total) by mouth daily. What changed:    medication strength  how much to take   multivitamin with minerals Tabs tablet Take 1 tablet by mouth daily.   NOVOLOG 100 UNIT/ML injection Generic  drug:  insulin aspart Inject 0-50 Units into the skin continuous. Up to 50 units/ day via pump         DISCHARGE INSTRUCTIONS:   DIET:  Cardiac diet and Diabetic diet  DISCHARGE CONDITION:  Stable  ACTIVITY:  Activity as tolerated  OXYGEN:  Home Oxygen: No.   Oxygen Delivery: room air  DISCHARGE LOCATION:  home   If you experience worsening of your admission symptoms, develop shortness of breath, life threatening emergency, suicidal or homicidal thoughts you must seek medical attention immediately by calling 911 or calling your MD immediately  if symptoms less severe.  You Must read complete instructions/literature along with all the possible adverse reactions/side effects for all the Medicines you take and that have been prescribed to you. Take any new Medicines after you have completely understood and accpet all the possible adverse reactions/side effects.   Please note  You were cared for by a hospitalist during your hospital stay. If you have any questions about your discharge medications or the care you received while you were in the hospital after you are discharged, you can call the unit and asked to speak with the hospitalist on call if the hospitalist that took care of you is not available. Once you are discharged, your primary care physician will handle any further medical issues. Please note that NO REFILLS for any discharge medications will be authorized once you are discharged, as it is imperative that you return to your primary care physician (or establish a relationship with a primary care physician if you do not have one) for your aftercare needs so that they can reassess your need for medications and monitor your lab values.     Today   Bradycardia resolved now.  Patient has no evidence of dizziness orthostasis.  Patient has been taken off her beta-blocker.  Will discharge home today.  VITAL SIGNS:  Blood pressure (!) 147/68, pulse 69, temperature 98.6 F  (37 C), temperature source Oral, resp. rate 20, height 5\' 2"  (1.575 m), weight 50.4 kg, SpO2 97 %.  I/O:  No intake or output data in the 24 hours ending 09/22/18 1546  PHYSICAL EXAMINATION:   GENERAL:  84 y.o.-year-old patient lying in bed in no acute distress.  EYES: Pupils equal, round, reactive to light and accommodation. No scleral icterus. Extraocular muscles intact.  HEENT: Head atraumatic, normocephalic. Oropharynx and nasopharynx clear.  NECK:  Supple, no jugular venous distention. No thyroid enlargement, no tenderness.  LUNGS: Normal breath sounds bilaterally, no wheezing, rales, rhonchi. No use of accessory muscles of respiration.  CARDIOVASCULAR: S1, S2 normal. No murmurs, rubs, or gallops.  ABDOMEN: Soft, nontender, nondistended. Bowel sounds present. No organomegaly or mass.  EXTREMITIES: No cyanosis, clubbing or edema b/l.    NEUROLOGIC: Cranial nerves II through XII are intact. No focal Motor or sensory deficits b/l.   PSYCHIATRIC: The patient is alert and oriented x 3.  SKIN: No obvious rash, lesion, or ulcer.   DATA REVIEW:   CBC Recent Labs  Lab  09/22/18 0322  WBC 4.3  HGB 10.7*  HCT 35.4*  PLT 42*    Chemistries  Recent Labs  Lab 09/20/18 2231  09/22/18 0322  NA 136   < > 142  K 4.3   < > 4.3  CL 102   < > 110  CO2 27   < > 26  GLUCOSE 289*   < > 139*  BUN 16   < > 10  CREATININE 0.98   < > 0.58  CALCIUM 8.5*   < > 8.1*  MG 2.1  --   --   AST 119*  --   --   ALT 77*  --   --   ALKPHOS 70  --   --   BILITOT 0.4  --   --    < > = values in this interval not displayed.    Cardiac Enzymes Recent Labs  Lab 09/21/18 2042  TROPONINI <0.03    RADIOLOGY:  Dg Chest 2 View  Result Date: 09/20/2018 CLINICAL DATA:  Shortness of breath today. EXAM: CHEST - 2 VIEW COMPARISON:  CT chest 08/08/2018. Chest 09/10/2016. FINDINGS: Borderline heart size with normal pulmonary vascularity. Calcification in the mitral valve annulus. No airspace disease or  consolidation in the lungs. Slight fibrosis in the lung bases. Mediastinal contours appear intact. Calcification of the aorta. IMPRESSION: No evidence of active pulmonary disease. Electronically Signed   By: Burman Nieves M.D.   On: 09/20/2018 22:19      Management plans discussed with the patient, family and they are in agreement.  CODE STATUS:  Code Status History    Date Active Date Inactive Code Status Order ID Comments User Context   09/21/2018 0245 09/22/2018 1401 Full Code [id number]  Oralia Manis, MD Inpatient      TOTAL TIME TAKING CARE OF THIS PATIENT: 40 minutes.    Houston Siren M.D on 09/22/2018 at 3:46 PM  Between 7am to 6pm - Pager - [phone]  After 6pm go to [email or web address] - Social research officer, government  [facility]  Office  [phone]  CC: Primary care physician; Marguarite Arbour, MD

## 2018-09-22 NOTE — Plan of Care (Signed)
  Problem: Health Behavior/Discharge Planning: Goal: Ability to manage health-related needs will improve Outcome: Progressing   Problem: Clinical Measurements: Goal: Ability to maintain clinical measurements within normal limits will improve Outcome: Progressing Goal: Diagnostic test results will improve Outcome: Progressing   Problem: Safety: Goal: Ability to remain free from injury will improve Outcome: Progressing   

## 2018-09-22 NOTE — Plan of Care (Signed)
  Problem: Education: °Goal: Knowledge of General Education information will improve °Description: Including pain rating scale, medication(s)/side effects and non-pharmacologic comfort measures °Outcome: Progressing °  °Problem: Health Behavior/Discharge Planning: °Goal: Ability to manage health-related needs will improve °Outcome: Progressing °  °Problem: Clinical Measurements: °Goal: Ability to maintain clinical measurements within normal limits will improve °Outcome: Progressing °Goal: Will remain free from infection °Outcome: Progressing °Goal: Diagnostic test results will improve °Outcome: Progressing °Goal: Respiratory complications will improve °Outcome: Progressing °Goal: Cardiovascular complication will be avoided °Outcome: Progressing °  °Problem: Nutrition: °Goal: Adequate nutrition will be maintained °Outcome: Progressing °  °Problem: Safety: °Goal: Ability to remain free from injury will improve °Outcome: Progressing °  °

## 2018-09-22 NOTE — Progress Notes (Signed)
Discharge paperwork reviewed with patient, spouse, and family.  All questions answered. No questions or concerns at this time. Pt discharged via wheelchair with family.

## 2018-09-22 NOTE — Progress Notes (Signed)
Progress Note  Patient Name: Madeline [name] Date of Encounter: 09/22/2018  Primary Cardiologist: [facility], Dr. Mariah Milling  Subjective   Reports that she feels well this morning, no complaints Telemetry reviewed showing improvement in rate started yesterday afternoon, Now normal sinus rhythm rate in the 60s Denies any significant shortness of breath   Inpatient Medications   Inpatient Medications: Scheduled Meds: . atorvastatin  20 mg Oral QHS  . citalopram  20 mg Oral Daily  . insulin aspart  0-5 Units Subcutaneous QHS  . insulin aspart  0-9 Units Subcutaneous TID WC  . insulin aspart  3 Units Subcutaneous TID WC  . insulin glargine  12 Units Subcutaneous Daily  . levothyroxine  50 mcg Oral QAC breakfast     Vital Signs    Vitals:   09/21/18 1512 09/21/18 2000 09/22/18 0346 09/22/18 0747  BP: 140/84 (!) 141/55 (!) 158/61 (!) 147/68  Pulse: 69 71 70 69  Resp: 19 18 20    Temp: 98 F (36.7 C) 98.7 F (37.1 C) 98.5 F (36.9 C) 98.6 F (37 C)  TempSrc: Oral Oral Oral Oral  SpO2: 99% 95% 96% 97%  Weight:   50.4 kg   Height:       No intake or output data in the 24 hours ending 09/22/18 1901 Filed Weights   09/20/18 2145 09/22/18 0346  Weight: 49.9 kg 50.4 kg    Telemetry    Normal sinus rhythm-heart rate in the 60s  personally Reviewed  ECG     - Personally Reviewed  Physical Exam   Constitutional:  oriented to person, place, and time. No distress.  HENT:  Head: Grossly normal Eyes:  no discharge. No scleral icterus.  Neck: No JVD, no carotid bruits  Cardiovascular: Regular rate and rhythm, no murmurs appreciated Pulmonary/Chest: Clear to auscultation bilaterally, no wheezes or rails Abdominal: Soft.  no distension.  no tenderness.  Musculoskeletal: Normal range of motion Neurological:  normal muscle tone. Coordination normal. No atrophy Skin: Skin warm and dry Psychiatric: normal affect, pleasant   Labs    Chemistry Recent Labs  Lab  09/20/18 2231 09/21/18 0301 09/22/18 0322  NA 136 138 142  K 4.3 4.3 4.3  CL 102 105 110  CO2 27 27 26   GLUCOSE 289* 255* 139*  BUN 16 15 10   CREATININE 0.98 0.74 0.58  CALCIUM 8.5* 8.1* 8.1*  PROT 5.7*  --   --   ALBUMIN 3.4*  --   --   AST 119*  --   --   ALT 77*  --   --   ALKPHOS 70  --   --   BILITOT 0.4  --   --   GFRNONAA 52* >60 >60  GFRAA >60 >60 >60  ANIONGAP 7 6 6      Hematology Recent Labs  Lab 09/20/18 2231 09/21/18 0301 09/22/18 0322  WBC 6.4 5.5 4.3  RBC 3.66* 3.47* 3.47*  HGB 11.4* 10.5* 10.7*  HCT 34.2* 33.2* 35.4*  MCV 93.4 95.7 102.0*  MCH 31.1 30.3 30.8  MCHC 33.3 31.6 30.2  RDW 12.5 12.6 12.6  PLT 43* 40* 42*    Cardiac Enzymes Recent Labs  Lab 09/20/18 2231 09/21/18 0916 09/21/18 1504 09/21/18 2042  TROPONINI <0.03 <0.03 <0.03 <0.03   No results for input(s): TROPIPOC in the last 168 hours.   BNPNo results for input(s): BNP, PROBNP in the last 168 hours.   DDimer No results for input(s): DDIMER in the last 168 hours.  Radiology    Dg Chest 2 View  Result Date: 09/20/2018 CLINICAL DATA:  Shortness of breath today. EXAM: CHEST - 2 VIEW COMPARISON:  CT chest 08/08/2018. Chest 09/10/2016. FINDINGS: Borderline heart size with normal pulmonary vascularity. Calcification in the mitral valve annulus. No airspace disease or consolidation in the lungs. Slight fibrosis in the lung bases. Mediastinal contours appear intact. Calcification of the aorta. IMPRESSION: No evidence of active pulmonary disease. Electronically Signed   By: Burman Nieves M.D.   On: 09/20/2018 22:19    Cardiac Studies   Echocardiogram Left ventricle: The cavity size was normal. There was mild   concentric hypertrophy. Systolic function was normal. The   estimated ejection fraction was in the range of 55% to 60%. Wall   motion was normal; there were no regional wall motion   abnormalities. Doppler parameters are consistent with abnormal   left ventricular  relaxation (grade 1 diastolic dysfunction). - Mitral valve: Calcified annulus. - Left atrium: The atrium was normal in size. - Right ventricle: Systolic function was normal. - Pulmonary arteries: Systolic pressure was midlly elevated. PA   peak pressure: 44 mm Hg (S).  Patient Profile      Madeline [name] is a 84 year old woman with past medical history of smoking,  Diabetes, hypothyroid, coronary artery disease with prior stent presenting with shortness of breath and chest pain   was moving furniture yesterday with her husband, developed some upper epigastric discomfort acutely, had to stop, he had to finish moving the furniture. "  It was heavy" Symptoms persisted and she presented to the emergency room shortness of breath on arrival " Wanted to make sure she did not have pneumonia", felt weak  placed on telemetry heart rate in the 40s noted Vitals noted from the emergency room was hypotensive systolic pressure in the 80s with pulse 40, 1 recording pulse 36  Assessment & Plan    Sick sinus syndrome/marked sinus bradycardia Rate and rhythm improved by holding metoprolol Rate in the 60s, no pauses, no other arrhythmia noted Would continue to hold metoprolol indefinitely  Chest pain Describes it more like upper epigastric pain or right flank pain Symptoms resolved, no further work-up at this time  CAD, prior stent  cardiac enzymes negative, echocardiogram pending No further ischemic work-up at this time given symptom-free Follow-up as outpatient  Hyperlipidemia At goal  Greater than 50% was spent in counseling and coordination of care with patient Total encounter time 25 minutes or more   Signed: Dossie Arbour  M.D., Ph.D. [facility]    For questions or updates, please contact [facility] Please consult [email or web address] for contact info under        Signed, Julien Nordmann, MD  09/22/2018, 7:01 PM

## 2018-09-22 NOTE — Progress Notes (Signed)
Inpatient Diabetes Program Recommendations  AACE/ADA: New Consensus Statement on Inpatient Glycemic Control (2015)  Target Ranges:  Prepandial:   less than 140 mg/dL      Peak postprandial:   less than 180 mg/dL (1-2 hours)      Critically ill patients:  140 - 180 mg/dL   Lab Results  Component Value Date   GLUCAP 147 (H) 09/22/2018   HGBA1C 9.0 (H) 08/12/2018    Review of Glycemic Control Results for Madeline [name], Madeline [name] (MRN [id number]) as of 09/22/2018 09:49  Ref. Range 09/21/2018 07:24 09/21/2018 11:44 09/21/2018 16:07 09/21/2018 21:40 09/22/2018 07:48  Glucose-Capillary Latest Ref Range: 70 - 99 mg/dL 409 (H) 811 (H) 914 (H) 156 (H) 147 (H)   Diabetes history: DM 1 Outpatient Diabetes medications: Insulin pump Current orders for Inpatient glycemic control:  Lantus 12 units daily, Novolog sensitive tid with meals, and Novolog 3 units tid with meals Inpatient Diabetes Program Recommendations:   Spoke with RN.  Plan is for patient to d/c home today.  Recommend holding Lantus and having patient restart insulin pump as soon as she gets home.  RN discussed with MD and told patient and family the plan.   Thanks,  Beryl Meager, RN, BC-ADM Inpatient Diabetes Coordinator Pager [phone] (8a-5p)

## 2018-09-26 ENCOUNTER — Other Ambulatory Visit: Payer: Self-pay | Admitting: Pharmacist

## 2018-09-26 ENCOUNTER — Ambulatory Visit
Admission: RE | Admit: 2018-09-26 | Discharge: 2018-09-26 | Disposition: A | Discharge status: Home / Self Care | Payer: PPO | Source: Ambulatory Visit | Attending: Internal Medicine | Admitting: Internal Medicine

## 2018-09-26 DIAGNOSIS — E1065 Type 1 diabetes mellitus with hyperglycemia: Secondary | ICD-10-CM | POA: Diagnosis not present

## 2018-09-26 DIAGNOSIS — Z1231 Encounter for screening mammogram for malignant neoplasm of breast: Secondary | ICD-10-CM | POA: Diagnosis not present

## 2018-09-26 NOTE — Patient Outreach (Signed)
[facility]) Care Management  [facility]   09/26/2018  Madeline [name] 28-Jul-1934 [id number]   84 year old female outreached by [facility] services for a 30 day post discharge medication review.  PMHx includes, but not limited to, diabetes, coronary artery disease, hyperlipidemia, hypertension and  hypothyroidism.   Successful outreach attempt to Madeline [name]. HIPAA identifiers verified.    Subjective: Patient reports feeling much better after hospital discharge. She reports being grateful for the care she received and is glad she is doing well. She confirms she has stopped taking metoprolol as ordered by physician. Patient reports her blood pressure has been running in the 130s/90s when checking with her home blood pressure monitor. She also reports her blood glucose levels overall controlled between 90-135 with one high in the 300s and a low this morning at 55. Patient states she self treated and "feels like blood sugars went back up" after this morning's low. Patient reports no medication affordability issues.   Objective: Hbg A1C: 9% on 08/12/18 Scr: 0.58mg /dL on 40/9/81   Current Medications: Current Outpatient Medications  Medication Sig Dispense Refill  . atorvastatin (LIPITOR) 20 MG tablet TAKE 1 TABLET BY MOUTH ONCE DAILY *PLEASE  KEEP  UPCOMING  APPOINTMENT  FOR  FUTURE  REFILLS* (Patient taking differently: Take 20 mg by mouth at bedtime. ) 90 tablet 3  . cetirizine (ZYRTEC) 10 MG tablet Take 10 mg by mouth daily.    . citalopram (CELEXA) 20 MG tablet Take 20 mg by mouth daily.      . Insulin Human (INSULIN PUMP) SOLN Inject 1 each into the skin continuous. Used with Novolog insulin    . levothyroxine (SYNTHROID, LEVOTHROID) 50 MCG tablet Take 50 mcg by mouth daily before breakfast.   3  . lisinopril (PRINIVIL,ZESTRIL) 20 MG tablet Take 1 tablet (20 mg total) by mouth daily. 30 tablet 1  . Multiple Vitamin (MULTIVITAMIN WITH MINERALS) TABS tablet Take 1  tablet by mouth daily.    Marland Kitchen NOVOLOG 100 UNIT/ML injection Inject 0-50 Units into the skin continuous. Up to 50 units/ day via pump     No current facility-administered medications for this visit.     Functional Status: In your present state of health, do you have any difficulty performing the following activities: 09/21/2018  Hearing? Y  Vision? N  Difficulty concentrating or making decisions? N  Walking or climbing stairs? Y  Dressing or bathing? Y  Doing errands, shopping? N  Some recent data might be hidden    Fall/Depression Screening: No flowsheet data found. No flowsheet data found.  ASSESSMENT:  Date Discharged from Hospital: 09/22/2018 Date Medication Reconciliation Performed: 09/26/2018  Medications Discontinued at Discharge:   Metoprolol tartrate   No new medications were prescribed at discharge.  Patient was recently discharged from hospital and all medications have been reviewed  Drugs sorted by system:  Neurologic/Psychologic: citalopram   Cardiovascular: Atorvastatin, lisinopril   Pulmonary/Allergy: Cetirizine   Endocrine: Novolog (insulin pump), levothyroxine   Vitamins/Minerals: Multivitamin   Recommended patient follows up with PCP for diabetes management if continues to see low blood glucose levels.   PLAN: -Route note to PCP, Dr. Lenard Forth, Ilda Basset D PGY1 Pharmacy Resident  Phone [phone] 09/26/2018   10:10 AM

## 2018-09-30 DIAGNOSIS — J449 Chronic obstructive pulmonary disease, unspecified: Secondary | ICD-10-CM | POA: Diagnosis not present

## 2018-09-30 DIAGNOSIS — R001 Bradycardia, unspecified: Secondary | ICD-10-CM | POA: Diagnosis not present

## 2018-09-30 DIAGNOSIS — E039 Hypothyroidism, unspecified: Secondary | ICD-10-CM | POA: Diagnosis not present

## 2018-09-30 DIAGNOSIS — E1042 Type 1 diabetes mellitus with diabetic polyneuropathy: Secondary | ICD-10-CM | POA: Diagnosis not present

## 2018-09-30 DIAGNOSIS — I10 Essential (primary) hypertension: Secondary | ICD-10-CM | POA: Diagnosis not present

## 2018-10-10 ENCOUNTER — Ambulatory Visit (INDEPENDENT_AMBULATORY_CARE_PROVIDER_SITE_OTHER): Payer: PPO | Admitting: Cardiology

## 2018-10-10 ENCOUNTER — Encounter: Payer: Self-pay | Admitting: Cardiology

## 2018-10-10 VITALS — BP 138/80 | HR 85 | Ht 62.0 in | Wt 113.8 lb

## 2018-10-10 DIAGNOSIS — I493 Ventricular premature depolarization: Secondary | ICD-10-CM | POA: Diagnosis not present

## 2018-10-10 DIAGNOSIS — I10 Essential (primary) hypertension: Secondary | ICD-10-CM | POA: Diagnosis not present

## 2018-10-10 DIAGNOSIS — I2510 Atherosclerotic heart disease of native coronary artery without angina pectoris: Secondary | ICD-10-CM | POA: Diagnosis not present

## 2018-10-10 DIAGNOSIS — R001 Bradycardia, unspecified: Secondary | ICD-10-CM

## 2018-10-10 NOTE — Progress Notes (Signed)
10/10/2018 Madeline [name]   [birth year]  [id number]  Primary Physician Judithann Sheen Duane Lope, MD Primary Cardiologist: Dr. Tenny Craw  Reason for Visit/CC: Post hospital f/u for bradycardia   HPI:  Madeline [name] is a 84 y.o. female who is being seen today for post hospital f/u. She is followed by Dr. Tenny Craw. She has a h/o CAD, s/p inferior STEMI in 2012. She had occlusion of the RCA and underwent PTCA with DES to the prox RCA. It was a right dominant system with dual PDAs. Other vessels had irregularities. She was d/cd home on Brilinta. This has since been discontinued and on daily ASA.   She was recently admitted to [facility] for dizziness and mild chest discomfort. She was found to be markedly bradycardic, sinus, with rates in the 30s. She was admitted and her metoprolol was held. She ruled out for MI with negative troponins. All other labs were unremarkable. She was observed on tele and her HR improved back to normal. Her symptoms resolved as her bradycardia resolved. She denied any recurrent CP or dizziness. 2D echo was also done and showed normal LVEF and no significant valvular abnormalities. Cardiology was consulted and she was seen by Dr. Mariah Milling. Given her HR normalized and her symptoms resolved, there was no indication for pacing and no need for further inpatient cardiac w/u. Further use of AV nodal blocking agents were advised to be avoided. Her lisinopril was increased from 10 mg to 20 mg daily for added BP control. .  She is back today for f/u. She is doing well. No further cardiac symptoms. Her pulse rate is stable in the 80s. BP is well controlled on lisinopril. EKG shows SR with PVCs.    Current Meds  Medication Sig  . atorvastatin (LIPITOR) 20 MG tablet TAKE 1 TABLET BY MOUTH ONCE DAILY *PLEASE  KEEP  UPCOMING  APPOINTMENT  FOR  FUTURE  REFILLS*  . cetirizine (ZYRTEC) 10 MG tablet Take 10 mg by mouth daily.  . citalopram (CELEXA) 20 MG tablet Take 20 mg by mouth daily.     . Insulin Human (INSULIN PUMP) SOLN Inject 1 each into the skin continuous. Used with Novolog insulin  . levothyroxine (SYNTHROID, LEVOTHROID) 50 MCG tablet Take 50 mcg by mouth daily before breakfast.   . lisinopril (PRINIVIL,ZESTRIL) 20 MG tablet Take 1 tablet (20 mg total) by mouth daily.  . Multiple Vitamin (MULTIVITAMIN WITH MINERALS) TABS tablet Take 1 tablet by mouth daily.  Marland Kitchen NOVOLOG 100 UNIT/ML injection Inject 0-50 Units into the skin continuous. Up to 50 units/ day via pump   Allergies  Allergen Reactions  . Darvon     "FEEL SICK"  . Propoxyphene Nausea Only and Nausea And Vomiting  . Sulfa Antibiotics Nausea Only and Nausea And Vomiting    "FEEL SICK"    Past Medical History:  Diagnosis Date  . Diabetes mellitus   . Dyslipidemia   . Hyperthyroidism    Family History  Problem Relation Age of Onset  . Heart Problems Mother   . Other Father        UNK  . Cancer Sister        BREAST  . Breast cancer Sister 42  . Cancer Sister        BREAST  . Breast cancer Sister 73  . Cancer Sister        BREAST  . Diabetes Son    Past Surgical History:  Procedure Laterality Date  . BREAST BIOPSY Right  1967   EXCISIONAL - NEG  . no surgical hx     Social History   Socioeconomic History  . Marital status: Married    Spouse name: Not on file  . Number of children: Not on file  . Years of education: Not on file  . Highest education level: Not on file  Occupational History  . Not on file  Social Needs  . Financial resource strain: Not on file  . Food insecurity:    Worry: Not on file    Inability: Not on file  . Transportation needs:    Medical: Not on file    Non-medical: Not on file  Tobacco Use  . Smoking status: Current Every Day Smoker    Packs/day: 1.00    Years: 50.00    Pack years: 50.00    Types: Cigarettes  . Smokeless tobacco: Never Used  Substance and Sexual Activity  . Alcohol use: No  . Drug use: No  . Sexual activity: Not on file  Lifestyle   . Physical activity:    Days per week: Not on file    Minutes per session: Not on file  . Stress: Not on file  Relationships  . Social connections:    Talks on phone: Not on file    Gets together: Not on file    Attends religious service: Not on file    Active member of club or organization: Not on file    Attends meetings of clubs or organizations: Not on file    Relationship status: Not on file  . Intimate partner violence:    Fear of current or ex partner: Not on file    Emotionally abused: Not on file    Physically abused: Not on file    Forced sexual activity: Not on file  Other Topics Concern  . Not on file  Social History Narrative  . Not on file     Review of Systems: General: negative for chills, fever, night sweats or weight changes.  Cardiovascular: negative for chest pain, dyspnea on exertion, edema, orthopnea, palpitations, paroxysmal nocturnal dyspnea or shortness of breath Dermatological: negative for rash Respiratory: negative for cough or wheezing Urologic: negative for hematuria Abdominal: negative for nausea, vomiting, diarrhea, bright red blood per rectum, melena, or hematemesis Neurologic: negative for visual changes, syncope, or dizziness All other systems reviewed and are otherwise negative except as noted above.   Physical Exam:  Blood pressure 138/80, pulse 85, height 5\' 2"  (1.575 m), weight 113 lb 12.8 oz (51.6 kg), SpO2 98 %.  General appearance: alert, cooperative and no distress Neck: no carotid bruit and no JVD Lungs: clear to auscultation bilaterally Heart: regular rate and rhythm, PVCs, 2/6 SM at RUSB Extremities: extremities normal, atraumatic, no cyanosis or edema Pulses: 2+ and symmetric Skin: Skin color, texture, turgor normal. No rashes or lesions Neurologic: Grossly normal  EKG SR, PVCs-- personally reviewed   ASSESSMENT AND PLAN:   1. Symptomatic Bradycardia: recent hospital admission for HR in the 30s, while on   blocker Therapy w/ metoprolol. This was discontinued. Symptoms resolved and HR normalized with discontinuation and washout. She has done well since. HR in the 80s. No symptoms. EKG shows some PVCs. She is asymptomatic. She was advised to stay well hydrated and avoid triggers, caffeine and ETOH. If she develops palpitations, dyspnea, CP, dizziness, syncope/ near syncope, she was advised to call our office. If symptoms develop, would recommend 24-48 hr Holter monitor to assess PVC burden.   2.  CAD: h/o IM in 2012 with RCA PCI. She denies anginal symptoms. Continue medical therapy w/ ASA and statin. No  blockers due to bradycardia.   3. Murmur: 2/6 SM noted at RUSB and left sternal border. Hospital echo from 09/2018 reviewed. No significant valvular abnormalities were noted. Report reads no AS or AI. ? sclerosis . She denies CP and dyspnea. No further w/u at this time.   4 HTN: controlled on current regimen. Her metoprolol was discontinued recent admission due to bradycardia. Lisinopril was increased from 10 to 20 mg for added BP control. Tolerating well and BP stable.    Follow-Up w/ Dr. Tenny Crawoss in 2020 as planned. She will call if she develops any cardiac symptoms in the meantime.   Madeline [name] Delmer IslamSimmons PA-C, [facility] 10/10/2018 3:19 PM

## 2018-10-10 NOTE — Patient Instructions (Signed)
Medication Instructions:  none If you need a refill on your cardiac medications before your next appointment, please call your pharmacy.   Lab work: none If you have labs (blood work) drawn today and your tests are completely normal, you will receive your results only by: Marland Kitchen. MyChart Message (if you have MyChart) OR . A paper copy in the mail If you have any lab test that is abnormal or we need to change your treatment, we will call you to review the results.  Testing/Procedures: none  Follow-Up:KEEP APPOINTMENT  At [facility], you and your health needs are our priority.  As part of our continuing mission to provide you with exceptional heart care, we have created designated Provider Care Teams.  These Care Teams include your primary Cardiologist (physician) and Advanced Practice Providers (APPs -  Physician Assistants and Nurse Practitioners) who all work together to provide you with the care you need, when you need it. .   Any Other Special Instructions Will Be Listed Below (If Applicable). If you feel palpitations, skipped beats, dizzy, like you are going to faint, short of breath, or chest discomfort CALL OFFICE [phone]

## 2018-10-11 ENCOUNTER — Inpatient Hospital Stay (HOSPITAL_COMMUNITY): Payer: PPO | Attending: Hematology

## 2018-10-11 DIAGNOSIS — D693 Immune thrombocytopenic purpura: Secondary | ICD-10-CM | POA: Insufficient documentation

## 2018-10-11 LAB — COMPREHENSIVE METABOLIC PANEL
ALT: 24 U/L (ref 0–44)
AST: 30 U/L (ref 15–41)
Albumin: 3.8 g/dL (ref 3.5–5.0)
Alkaline Phosphatase: 80 U/L (ref 38–126)
Anion gap: 7 (ref 5–15)
BUN: 7 mg/dL — ABNORMAL LOW (ref 8–23)
CO2: 28 mmol/L (ref 22–32)
Calcium: 9.2 mg/dL (ref 8.9–10.3)
Chloride: 105 mmol/L (ref 98–111)
Creatinine, Ser: 0.73 mg/dL (ref 0.44–1.00)
GFR calc Af Amer: >60 mL/min (ref >60)
GFR calc non Af Amer: >60 mL/min (ref >60)
Glucose, Bld: 190 mg/dL — ABNORMAL HIGH (ref 70–99)
Potassium: 4.3 mmol/L (ref 3.5–5.1)
Sodium: 140 mmol/L (ref 135–145)
Total Bilirubin: 0.6 mg/dL (ref 0.3–1.2)
Total Protein: 6.5 g/dL (ref 6.5–8.1)

## 2018-10-11 LAB — CBC WITH DIFFERENTIAL/PLATELET
Abs Immature Granulocytes: 0.01 10*3/uL (ref 0.00–0.07)
Basophils Absolute: 0 10*3/uL (ref 0.0–0.1)
Basophils Relative: 1 %
Eosinophils Absolute: 0.1 10*3/uL (ref 0.0–0.5)
Eosinophils Relative: 2 %
HCT: 40.1 % (ref 36.0–46.0)
Hemoglobin: 12.8 g/dL (ref 12.0–15.0)
Immature Granulocytes: 0 %
Lymphocytes Relative: 38 %
Lymphs Abs: 2.2 10*3/uL (ref 0.7–4.0)
MCH: 30.2 pg (ref 26.0–34.0)
MCHC: 31.9 g/dL (ref 30.0–36.0)
MCV: 94.6 fL (ref 80.0–100.0)
Monocytes Absolute: 0.3 10*3/uL (ref 0.1–1.0)
Monocytes Relative: 5 %
Neutro Abs: 3.1 10*3/uL (ref 1.7–7.7)
Neutrophils Relative %: 54 %
Platelets: 58 10*3/uL — ABNORMAL LOW (ref 150–400)
RBC: 4.24 MIL/uL (ref 3.87–5.11)
RDW: 12.3 % (ref 11.5–15.5)
WBC: 5.8 10*3/uL (ref 4.0–10.5)
nRBC: 0 % (ref 0.0–0.2)

## 2018-10-11 LAB — LACTATE DEHYDROGENASE: LDH: 172 U/L (ref 98–192)

## 2018-10-18 ENCOUNTER — Inpatient Hospital Stay (HOSPITAL_COMMUNITY): Payer: PPO | Attending: Hematology | Admitting: Hematology

## 2018-10-18 ENCOUNTER — Encounter (HOSPITAL_COMMUNITY): Payer: Self-pay | Admitting: Hematology

## 2018-10-18 ENCOUNTER — Other Ambulatory Visit: Payer: Self-pay

## 2018-10-18 VITALS — BP 182/41 | HR 44 | Temp 98.0°F | Resp 16 | Wt 114.0 lb

## 2018-10-18 DIAGNOSIS — R911 Solitary pulmonary nodule: Secondary | ICD-10-CM | POA: Insufficient documentation

## 2018-10-18 DIAGNOSIS — D693 Immune thrombocytopenic purpura: Secondary | ICD-10-CM | POA: Insufficient documentation

## 2018-10-18 NOTE — Patient Instructions (Signed)
[facility] at [facility] Discharge Instructions   Follow up in 3 months with labs and CT scan   Thank you for choosing [facility] at [facility] to provide your oncology and hematology care.  To afford each patient quality time with our provider, please arrive at least 15 minutes before your scheduled appointment time.   If you have a lab appointment with the Cancer Center please come in thru the  Main Entrance and check in at the main information desk  You need to re-schedule your appointment should you arrive 10 or more minutes late.  We strive to give you quality time with our providers, and arriving late affects you and other patients whose appointments are after yours.  Also, if you no show three or more times for appointments you may be dismissed from the clinic at the providers discretion.     Again, thank you for choosing [facility].  Our hope is that these requests will decrease the amount of time that you wait before being seen by our physicians.       _____________________________________________________________  Should you have questions after your visit to [facility], please contact our office at [phone] between the hours of 8:00 a.m. and 4:30 p.m.  Voicemails left after 4:00 p.m. will not be returned until the following business day.  For prescription refill requests, have your pharmacy contact our office and allow 72 hours.    Cancer Center Support Programs:   > Cancer Support Group  2nd Tuesday of the month 1pm-2pm, Journey Room    

## 2018-10-18 NOTE — Progress Notes (Signed)
[facility] [address], [place] [address]   CLINIC:  Medical Oncology/Hematology  PCP:  Marguarite Arbour, MD [address] [facility] [place] [address] [phone]   REASON FOR VISIT: Follow-up for Chronic ITP with exacerbations  CURRENT THERAPY: Observation   INTERVAL HISTORY:  [name] 84 y.o. female returns for routine follow-up for chronic ITP. She is here today and doing well. She did have a recent hospitalization for decreased heart rate. She spend a couple of days in the hospital. She is doing well since she has been released. She denies any new pains. Denies any nausea, vomiting, or diarrhea. Denies any bleeding. Deneis any fevers or recent infections. She reports her appetite at 100% and her energy level at 50%. She tries to remain active at home and performs her own ADLs.    REVIEW OF SYSTEMS:  Review of Systems  All other systems reviewed and are negative.    PAST MEDICAL/SURGICAL HISTORY:  Past Medical History:  Diagnosis Date  . Diabetes mellitus   . Dyslipidemia   . Hyperthyroidism    Past Surgical History:  Procedure Laterality Date  . BREAST BIOPSY Right 1967   EXCISIONAL - NEG  . no surgical hx       SOCIAL HISTORY:  Social History   Socioeconomic History  . Marital status: Married    Spouse name: Not on file  . Number of children: Not on file  . Years of education: Not on file  . Highest education level: Not on file  Occupational History  . Not on file  Social Needs  . Financial resource strain: Not on file  . Food insecurity:    Worry: Not on file    Inability: Not on file  . Transportation needs:    Medical: Not on file    Non-medical: Not on file  Tobacco Use  . Smoking status: Current Every Day Smoker    Packs/day: 1.00    Years: 50.00    Pack years: 50.00    Types: Cigarettes  . Smokeless tobacco: Never Used  Substance and Sexual Activity  . Alcohol use: No  . Drug use: No    . Sexual activity: Not on file  Lifestyle  . Physical activity:    Days per week: Not on file    Minutes per session: Not on file  . Stress: Not on file  Relationships  . Social connections:    Talks on phone: Not on file    Gets together: Not on file    Attends religious service: Not on file    Active member of club or organization: Not on file    Attends meetings of clubs or organizations: Not on file    Relationship status: Not on file  . Intimate partner violence:    Fear of current or ex partner: Not on file    Emotionally abused: Not on file    Physically abused: Not on file    Forced sexual activity: Not on file  Other Topics Concern  . Not on file  Social History Narrative  . Not on file    FAMILY HISTORY:  Family History  Problem Relation Age of Onset  . Heart Problems Mother   . Other Father        UNK  . Cancer Sister        BREAST  . Breast cancer Sister 22  . Cancer Sister  BREAST  . Breast cancer Sister 24  . Cancer Sister        BREAST  . Diabetes Son     CURRENT MEDICATIONS:  Outpatient Encounter Medications as of 10/18/2018  Medication Sig  . atorvastatin (LIPITOR) 20 MG tablet TAKE 1 TABLET BY MOUTH ONCE DAILY *PLEASE  KEEP  UPCOMING  APPOINTMENT  FOR  FUTURE  REFILLS*  . cetirizine (ZYRTEC) 10 MG tablet Take 10 mg by mouth daily.  . citalopram (CELEXA) 20 MG tablet Take 20 mg by mouth daily.    . Insulin Human (INSULIN PUMP) SOLN Inject 1 each into the skin continuous. Used with Novolog insulin  . levothyroxine (SYNTHROID, LEVOTHROID) 50 MCG tablet Take 50 mcg by mouth daily before breakfast.   . lisinopril (PRINIVIL,ZESTRIL) 20 MG tablet Take 1 tablet (20 mg total) by mouth daily.  . Multiple Vitamin (MULTIVITAMIN WITH MINERALS) TABS tablet Take 1 tablet by mouth daily.  Marland Kitchen NOVOLOG 100 UNIT/ML injection Inject 0-50 Units into the skin continuous. Up to 50 units/ day via pump   No facility-administered encounter medications on file as of  10/18/2018.     ALLERGIES:  Allergies  Allergen Reactions  . Darvon     "FEEL SICK"  . Propoxyphene Nausea Only and Nausea And Vomiting  . Sulfa Antibiotics Nausea Only and Nausea And Vomiting    "FEEL SICK"      PHYSICAL EXAM:  ECOG Performance status: 1  Vitals:   10/18/18 1127  BP: (!) 182/41  Pulse: (!) 44  Resp: 16  Temp: 98 F (36.7 C)  SpO2: 100%   Filed Weights   10/18/18 1127  Weight: 114 lb (51.7 kg)    Physical Exam  Constitutional: She is oriented to person, place, and time. She appears well-developed and well-nourished.  Cardiovascular: Normal rate, regular rhythm and normal heart sounds.  Pulmonary/Chest: Effort normal and breath sounds normal.  Musculoskeletal: Normal range of motion.  Neurological: She is alert and oriented to person, place, and time.  Skin: Skin is warm and dry.  Psychiatric: She has a normal mood and affect. Her behavior is normal. Judgment and thought content normal.     LABORATORY DATA:  I have reviewed the labs as listed.  CBC    Component Value Date/Time   WBC 5.8 10/11/2018 1234   RBC 4.24 10/11/2018 1234   HGB 12.8 10/11/2018 1234   HGB 11.5 08/13/2017 1232   HCT 40.1 10/11/2018 1234   HCT 34.9 08/13/2017 1232   PLT 58 (L) 10/11/2018 1234   PLT 41 (LL) 08/13/2017 1232   MCV 94.6 10/11/2018 1234   MCV 90 08/13/2017 1232   MCH 30.2 10/11/2018 1234   MCHC 31.9 10/11/2018 1234   RDW 12.3 10/11/2018 1234   RDW 13.1 08/13/2017 1232   LYMPHSABS 2.2 10/11/2018 1234   MONOABS 0.3 10/11/2018 1234   EOSABS 0.1 10/11/2018 1234   BASOSABS 0.0 10/11/2018 1234   CMP Latest Ref Rng & Units 10/11/2018 09/22/2018 09/21/2018  Glucose 70 - 99 mg/dL 161(W) 960(A) 540(J)  BUN 8 - 23 mg/dL 7(L) 10 15  Creatinine 0.44 - 1.00 mg/dL 8.11 9.14 7.82  Sodium 135 - 145 mmol/L 140 142 138  Potassium 3.5 - 5.1 mmol/L 4.3 4.3 4.3  Chloride 98 - 111 mmol/L 105 110 105  CO2 22 - 32 mmol/L 28 26 27   Calcium 8.9 - 10.3 mg/dL 9.2 8.1(L) 8.1(L)   Total Protein 6.5 - 8.1 g/dL 6.5 - -  Total Bilirubin 0.3 - 1.2  mg/dL 0.6 - -  Alkaline Phos 38 - 126 U/L 80 - -  AST 15 - 41 U/L 30 - -  ALT 0 - 44 U/L 24 - -       I have reviewed Mathis Budandi Joshua Zeringue, NP's note and agree with the documentation.  I personally performed a face-to-face visit, made revisions and my assessment and plan is as follows.      ASSESSMENT & PLAN:   Chronic ITP (idiopathic thrombocytopenia) (HCC) 1.  Chronic ITP with exacerbations: Patient had thrombocytopenia since July 2016.  CT abdomen on 02/02/2018 showed normal-sized spleen.  Bone marrow aspiration and biopsy in 09/15/2017 showed hypercellular marrow for age, trilineage hematopoiesis with abundant megakaryocytes with no dysplasia. - Prednisone tapering dose over 5 weeks given on 12/14/2017 with no improvement in platelet count - IVIG (1 g/kg) on 12/23/2017 and 12/24/2017 with peak platelet count of 92, response lasting about 3 weeks -Dexamethasone 40 mg for 4 days on 01/11/2018 with improvement in platelet count of 61. - She was recently hospitalized from 09/20/2018 through 09/22/2018 for bradycardia. -She denies any  bleeding issues.  She does have easy bruising, particularly of the upper extremities. -We reviewed the blood work today.  Platelet count is 58,000.  Hemoglobin is stable.  She does not require any active intervention at this time. -We will schedule her for follow-up in 3 months with repeat blood counts.  She was told to follow-up soon should she develop any active bleeding.  2.  Right middle lobe lung nodule: -CT of the chest on 02/02/2018 showed right middle lobe lung nodule. -We will repeat a CT scan prior to next visit for follow-up.        Orders placed this encounter:  Orders Placed This Encounter  Procedures  . CT CHEST W CONTRAST  . Lactate dehydrogenase  . CBC with Differential/Platelet  . Comprehensive metabolic panel      Doreatha MassedSreedhar Katragadda, MD [facility] [phone]

## 2018-10-18 NOTE — Assessment & Plan Note (Signed)
1.  Chronic ITP with exacerbations: Patient had thrombocytopenia since July 2016.  CT abdomen on 02/02/2018 showed normal-sized spleen.  Bone marrow aspiration and biopsy in 09/15/2017 showed hypercellular marrow for age, trilineage hematopoiesis with abundant megakaryocytes with no dysplasia. - Prednisone tapering dose over 5 weeks given on 12/14/2017 with no improvement in platelet count - IVIG (1 g/kg) on 12/23/2017 and 12/24/2017 with peak platelet count of 92, response lasting about 3 weeks -Dexamethasone 40 mg for 4 days on 01/11/2018 with improvement in platelet count of 61. - She was recently hospitalized from 09/20/2018 through 09/22/2018 for bradycardia. -She denies any  bleeding issues.  She does have easy bruising, particularly of the upper extremities. -We reviewed the blood work today.  Platelet count is 58,000.  Hemoglobin is stable.  She does not require any active intervention at this time. -We will schedule her for follow-up in 3 months with repeat blood counts.  She was told to follow-up soon should she develop any active bleeding.  2.  Right middle lobe lung nodule: -CT of the chest on 02/02/2018 showed right middle lobe lung nodule. -We will repeat a CT scan prior to next visit for follow-up.

## 2018-11-25 DIAGNOSIS — E039 Hypothyroidism, unspecified: Secondary | ICD-10-CM | POA: Diagnosis not present

## 2018-11-25 DIAGNOSIS — I10 Essential (primary) hypertension: Secondary | ICD-10-CM | POA: Diagnosis not present

## 2018-11-25 DIAGNOSIS — E1042 Type 1 diabetes mellitus with diabetic polyneuropathy: Secondary | ICD-10-CM | POA: Diagnosis not present

## 2018-11-25 DIAGNOSIS — Z79899 Other long term (current) drug therapy: Secondary | ICD-10-CM | POA: Diagnosis not present

## 2018-12-01 ENCOUNTER — Telehealth: Payer: Self-pay | Admitting: Internal Medicine

## 2018-12-01 DIAGNOSIS — R001 Bradycardia, unspecified: Secondary | ICD-10-CM

## 2018-12-01 DIAGNOSIS — E039 Hypothyroidism, unspecified: Secondary | ICD-10-CM | POA: Diagnosis not present

## 2018-12-01 DIAGNOSIS — E7800 Pure hypercholesterolemia, unspecified: Secondary | ICD-10-CM | POA: Diagnosis not present

## 2018-12-01 DIAGNOSIS — I10 Essential (primary) hypertension: Secondary | ICD-10-CM | POA: Diagnosis not present

## 2018-12-01 DIAGNOSIS — J449 Chronic obstructive pulmonary disease, unspecified: Secondary | ICD-10-CM | POA: Diagnosis not present

## 2018-12-01 DIAGNOSIS — E1042 Type 1 diabetes mellitus with diabetic polyneuropathy: Secondary | ICD-10-CM | POA: Diagnosis not present

## 2018-12-01 NOTE — Telephone Encounter (Signed)
1.  Bradycardia Cut metoprolol down to 1/2 tab per day (12.5 mg per day)  2  HTN    Blood pressure is up and down I would recomm adding 1.25 (1/2 of amlodipine 2.5 mg) daily  F/U in clinic in a few wks Keep track of BP    When comes in for appt she should bring cuff

## 2018-12-01 NOTE — Telephone Encounter (Signed)
New Message   STAT if HR is under 50 or over 120 (normal HR is 60-100 beats per minute)  1) What is your heart rate? 40  2) Do you have a log of your heart rate readings (document readings)? No log  3) Do you have any other symptoms? No   Pt saw her PC today and her HR was 40  Pt left line before I could transfer to triage  Please call

## 2018-12-01 NOTE — Telephone Encounter (Signed)
Called patient back about her message. Patient stated she saw her PCP earlier today and her HR was 40 and BP was 120's/60's. Patient stated that the PCP did not make any changes to her cardiac medications, but he did increase her synthroid. Asked patient what her HR was now, she took her BP and it was 173/63 with HR 45. Patient was instructed at last office visit to follow up as needed with Dr. Tenny Crawoss. Patient stated she only feels weak and has no other symptoms. Made patient an appointment in February. Will send message to Dr. Tenny Crawoss for further advisement.

## 2018-12-02 NOTE — Telephone Encounter (Signed)
Will route to Dr. Tenny Craw for review/clarification. Pt not taking metoprolol.  It was discontinued in Nov 2019.  Spoke with patient. Today her HR is 45. She does not feel lightheaded or dizzy. Some fatigue. Reviewed medications.  List is accurate. Adv pt I would call her back with further recommendations when I hear back from Dr. Tenny Craw.

## 2018-12-03 NOTE — Telephone Encounter (Signed)
Set patient up for 24 hour holter monitor

## 2018-12-05 ENCOUNTER — Ambulatory Visit (INDEPENDENT_AMBULATORY_CARE_PROVIDER_SITE_OTHER): Payer: PPO

## 2018-12-05 DIAGNOSIS — R001 Bradycardia, unspecified: Secondary | ICD-10-CM | POA: Diagnosis not present

## 2018-12-05 NOTE — Telephone Encounter (Signed)
Left message for patient to call back. Order for 24 hr holter in EPIC

## 2018-12-05 NOTE — Telephone Encounter (Signed)
Patient scheduled today 3pm for holter.  She is aware to arrive at 2:45 pm.

## 2018-12-08 ENCOUNTER — Telehealth: Payer: Self-pay

## 2018-12-08 NOTE — Telephone Encounter (Signed)
Call placed to Pt.  Briefly discussed bradycardia found on monitor.  Offered appt with Dr. Ladona Ridgel for 12/09/2018 at 12:15 pm.  Pt agrees.

## 2018-12-08 NOTE — Telephone Encounter (Signed)
Left message requesting call back.  Would like to make appt to see Dr. Ladona Ridgel tomorrow 12/09/2018 if Pt able.  Left message to return call to this nurse.

## 2018-12-09 ENCOUNTER — Encounter: Payer: Self-pay | Admitting: Internal Medicine

## 2018-12-09 ENCOUNTER — Ambulatory Visit (INDEPENDENT_AMBULATORY_CARE_PROVIDER_SITE_OTHER): Payer: PPO | Admitting: Internal Medicine

## 2018-12-09 VITALS — BP 142/72 | HR 42 | Ht 62.0 in | Wt 117.6 lb

## 2018-12-09 DIAGNOSIS — R001 Bradycardia, unspecified: Secondary | ICD-10-CM

## 2018-12-09 NOTE — Progress Notes (Signed)
HPI Madeline [name] is referred today by Dr. Tenny Craw for evaluation of high grade heart block. She has not had syncope. She does get fatigue and weakness with any activity other than walking slowly. She denies anginal symptoms. She wore a cardiac monitor which demonstrated high grade AV block.  Allergies  Allergen Reactions  . Darvon     "FEEL SICK"  . Propoxyphene Nausea Only and Nausea And Vomiting  . Sulfa Antibiotics Nausea Only and Nausea And Vomiting    "FEEL SICK"      Current Outpatient Medications  Medication Sig Dispense Refill  . atorvastatin (LIPITOR) 20 MG tablet TAKE 1 TABLET BY MOUTH ONCE DAILY *PLEASE  KEEP  UPCOMING  APPOINTMENT  FOR  FUTURE  REFILLS* 90 tablet 3  . cetirizine (ZYRTEC) 10 MG tablet Take 10 mg by mouth daily.    . citalopram (CELEXA) 20 MG tablet Take 20 mg by mouth daily.      . Insulin Human (INSULIN PUMP) SOLN Inject 1 each into the skin continuous. Used with Novolog insulin    . levothyroxine (SYNTHROID, LEVOTHROID) 50 MCG tablet Take 50 mcg by mouth daily before breakfast.   3  . Multiple Vitamin (MULTIVITAMIN WITH MINERALS) TABS tablet Take 1 tablet by mouth daily.    Marland Kitchen NOVOLOG 100 UNIT/ML injection Inject 0-50 Units into the skin continuous. Up to 50 units/ day via pump    . lisinopril (PRINIVIL,ZESTRIL) 20 MG tablet Take 1 tablet (20 mg total) by mouth daily. 30 tablet 1   No current facility-administered medications for this visit.      Past Medical History:  Diagnosis Date  . Diabetes mellitus   . Dyslipidemia   . Hyperthyroidism     ROS:   All systems reviewed and negative except as noted in the HPI.   Past Surgical History:  Procedure Laterality Date  . BREAST BIOPSY Right 1967   EXCISIONAL - NEG  . no surgical hx       Family History  Problem Relation Age of Onset  . Heart Problems Mother   . Other Father        UNK  . Cancer Sister        BREAST  . Breast cancer Sister 41  . Cancer Sister        BREAST  .  Breast cancer Sister 20  . Cancer Sister        BREAST  . Diabetes Son      Social History   Socioeconomic History  . Marital status: Married    Spouse name: Not on file  . Number of children: Not on file  . Years of education: Not on file  . Highest education level: Not on file  Occupational History  . Not on file  Social Needs  . Financial resource strain: Not on file  . Food insecurity:    Worry: Not on file    Inability: Not on file  . Transportation needs:    Medical: Not on file    Non-medical: Not on file  Tobacco Use  . Smoking status: Current Every Day Smoker    Packs/day: 1.00    Years: 50.00    Pack years: 50.00    Types: Cigarettes  . Smokeless tobacco: Never Used  Substance and Sexual Activity  . Alcohol use: No  . Drug use: No  . Sexual activity: Not on file  Lifestyle  . Physical activity:    Days per week: Not on  file    Minutes per session: Not on file  . Stress: Not on file  Relationships  . Social connections:    Talks on phone: Not on file    Gets together: Not on file    Attends religious service: Not on file    Active member of club or organization: Not on file    Attends meetings of clubs or organizations: Not on file    Relationship status: Not on file  . Intimate partner violence:    Fear of current or ex partner: Not on file    Emotionally abused: Not on file    Physically abused: Not on file    Forced sexual activity: Not on file  Other Topics Concern  . Not on file  Social History Narrative  . Not on file     BP (!) 142/72   Pulse (!) 42   Ht 5' 2" (1.575 m)   Wt 117 lb 9.6 oz (53.3 kg)   SpO2 98%   BMI 21.51 kg/m   Physical Exam:  Well appearing NAD HEENT: Unremarkable Neck:  No JVD, no thyromegally Lymphatics:  No adenopathy Back:  No CVA tenderness Lungs:  Clear with no wheezes HEART:  Regular brady rhythm, no murmurs, no rubs, no clicks Abd:  soft, positive bowel sounds, no organomegally, no rebound, no  guarding Ext:  2 plus pulses, no edema, no cyanosis, no clubbing Skin:  No rashes no nodules Neuro:  CN II through XII intact, motor grossly intact  EKG - nsr with 2:1 AV block   Assess/Plan: 1. High grade AV block - the patient has 2:1 AV block and LBBB. I have recommended proceeding with PPM insertion. 2. CAD - she denies angina but she is not taking any AV nodal blocking drugs. 3. Dyslipidemia - she will continue her statin therapy.   ,M.D. 

## 2018-12-09 NOTE — H&P (View-Only) (Signed)
HPI Madeline [name] is referred today by Dr. Tenny Craw for evaluation of high grade heart block. She has not had syncope. She does get fatigue and weakness with any activity other than walking slowly. She denies anginal symptoms. She wore a cardiac monitor which demonstrated high grade AV block.  Allergies  Allergen Reactions  . Darvon     "FEEL SICK"  . Propoxyphene Nausea Only and Nausea And Vomiting  . Sulfa Antibiotics Nausea Only and Nausea And Vomiting    "FEEL SICK"      Current Outpatient Medications  Medication Sig Dispense Refill  . atorvastatin (LIPITOR) 20 MG tablet TAKE 1 TABLET BY MOUTH ONCE DAILY *PLEASE  KEEP  UPCOMING  APPOINTMENT  FOR  FUTURE  REFILLS* 90 tablet 3  . cetirizine (ZYRTEC) 10 MG tablet Take 10 mg by mouth daily.    . citalopram (CELEXA) 20 MG tablet Take 20 mg by mouth daily.      . Insulin Human (INSULIN PUMP) SOLN Inject 1 each into the skin continuous. Used with Novolog insulin    . levothyroxine (SYNTHROID, LEVOTHROID) 50 MCG tablet Take 50 mcg by mouth daily before breakfast.   3  . Multiple Vitamin (MULTIVITAMIN WITH MINERALS) TABS tablet Take 1 tablet by mouth daily.    Marland Kitchen NOVOLOG 100 UNIT/ML injection Inject 0-50 Units into the skin continuous. Up to 50 units/ day via pump    . lisinopril (PRINIVIL,ZESTRIL) 20 MG tablet Take 1 tablet (20 mg total) by mouth daily. 30 tablet 1   No current facility-administered medications for this visit.      Past Medical History:  Diagnosis Date  . Diabetes mellitus   . Dyslipidemia   . Hyperthyroidism     ROS:   All systems reviewed and negative except as noted in the HPI.   Past Surgical History:  Procedure Laterality Date  . BREAST BIOPSY Right 1967   EXCISIONAL - NEG  . no surgical hx       Family History  Problem Relation Age of Onset  . Heart Problems Mother   . Other Father        UNK  . Cancer Sister        BREAST  . Breast cancer Sister 41  . Cancer Sister        BREAST  .  Breast cancer Sister 20  . Cancer Sister        BREAST  . Diabetes Son      Social History   Socioeconomic History  . Marital status: Married    Spouse name: Not on file  . Number of children: Not on file  . Years of education: Not on file  . Highest education level: Not on file  Occupational History  . Not on file  Social Needs  . Financial resource strain: Not on file  . Food insecurity:    Worry: Not on file    Inability: Not on file  . Transportation needs:    Medical: Not on file    Non-medical: Not on file  Tobacco Use  . Smoking status: Current Every Day Smoker    Packs/day: 1.00    Years: 50.00    Pack years: 50.00    Types: Cigarettes  . Smokeless tobacco: Never Used  Substance and Sexual Activity  . Alcohol use: No  . Drug use: No  . Sexual activity: Not on file  Lifestyle  . Physical activity:    Days per week: Not on  file    Minutes per session: Not on file  . Stress: Not on file  Relationships  . Social connections:    Talks on phone: Not on file    Gets together: Not on file    Attends religious service: Not on file    Active member of club or organization: Not on file    Attends meetings of clubs or organizations: Not on file    Relationship status: Not on file  . Intimate partner violence:    Fear of current or ex partner: Not on file    Emotionally abused: Not on file    Physically abused: Not on file    Forced sexual activity: Not on file  Other Topics Concern  . Not on file  Social History Narrative  . Not on file     BP (!) 142/72   Pulse (!) 42   Ht 5\' 2"  (1.575 m)   Wt 117 lb 9.6 oz (53.3 kg)   SpO2 98%   BMI 21.51 kg/m   Physical Exam:  Well appearing NAD HEENT: Unremarkable Neck:  No JVD, no thyromegally Lymphatics:  No adenopathy Back:  No CVA tenderness Lungs:  Clear with no wheezes HEART:  Regular brady rhythm, no murmurs, no rubs, no clicks Abd:  soft, positive bowel sounds, no organomegally, no rebound, no  guarding Ext:  2 plus pulses, no edema, no cyanosis, no clubbing Skin:  No rashes no nodules Neuro:  CN II through XII intact, motor grossly intact  EKG - nsr with 2:1 AV block   Assess/Plan: 1. High grade AV block - the patient has 2:1 AV block and LBBB. I have recommended proceeding with PPM insertion. 2. CAD - she denies angina but she is not taking any AV nodal blocking drugs. 3. Dyslipidemia - she will continue her statin therapy.  Leonia Reeves.D.

## 2018-12-09 NOTE — Patient Instructions (Addendum)
Medication Instructions:  Your physician recommends that you continue on your current medications as directed. Please refer to the Current Medication list given to you today.  Labwork: You will get lab work today:  BMP and CBC.  Testing/Procedures: Your physician has recommended that you have a pacemaker inserted. A pacemaker is a small device that is placed under the skin of your chest or abdomen to help control abnormal heart rhythms. This device uses electrical pulses to prompt the heart to beat at a normal rate. Pacemakers are used to treat heart rhythms that are too slow. Wire (leads) are attached to the pacemaker that goes into the chambers of you heart. This is done in the hospital and usually requires and overnight stay. Please see the instruction sheet given to you today for more information.  Follow-Up: You will follow up with the device clinic 10-14 days after your procedure for a wound check. You will follow up with Dr. Ladona Ridgel 91 days after your procedure.   PACEMAKER INSTRUCTIONS:  Please arrive to ADMITTING down the hall from the [facility] main entrance of [facility] at:  8:00 am on December 16, 2018  Use the CHG surgical scrub the night before and morning of your procedure.  Follow the instruction sheet.  Do not eat or drink after midnight prior to procedure  On the morning of your procedure take your normal AM medications with a sip of water except for:  Insulin.  Stop your pump the morning of your procedure.  Leave it attached to bring with you.    Plan for one night stay  You will need someone to drive you home at discharge  If you need a refill on your cardiac medications before your next appointment, please call your pharmacy.    Pacemaker Implantation, Adult Pacemaker implantation is a procedure to place a pacemaker inside your chest. A pacemaker is a small computer that sends electrical signals to the heart and helps your heart beat normally. A  pacemaker also stores information about your heart rhythms. You may need pacemaker implantation if you:  Have a slow heartbeat (bradycardia).  Faint (syncope).  Have shortness of breath (dyspnea) due to heart problems. The pacemaker attaches to your heart through a wire, called a lead. Sometimes just one lead is needed. Other times, there will be two leads. There are two types of pacemakers:  Transvenous pacemaker. This type is placed under the skin or muscle of your chest. The lead goes through a vein in the chest area to reach the inside of the heart.  Epicardial pacemaker. This type is placed under the skin or muscle of your chest or belly. The lead goes through your chest to the outside of the heart. Tell a health care provider about:  Any allergies you have.  All medicines you are taking, including vitamins, herbs, eye drops, creams, and over-the-counter medicines.  Any problems you or family members have had with anesthetic medicines.  Any blood or bone disorders you have.  Any surgeries you have had.  Any medical conditions you have.  Whether you are pregnant or may be pregnant. What are the risks? Generally, this is a safe procedure. However, problems may occur, including:  Infection.  Bleeding.  Failure of the pacemaker or the lead.  Collapse of a lung or bleeding into a lung.  Blood clot inside a blood vessel with a lead.  Damage to the heart.  Infection inside the heart (endocarditis).  Allergic reactions to medicines. What  happens before the procedure? Staying hydrated Follow instructions from your health care provider about hydration, which may include:  Up to 2 hours before the procedure - you may continue to drink clear liquids, such as water, clear fruit juice, black coffee, and plain tea. Eating and drinking restrictions Follow instructions from your health care provider about eating and drinking, which may include:  8 hours before the procedure -  stop eating heavy meals or foods such as meat, fried foods, or fatty foods.  6 hours before the procedure - stop eating light meals or foods, such as toast or cereal.  6 hours before the procedure - stop drinking milk or drinks that contain milk.  2 hours before the procedure - stop drinking clear liquids. Medicines  Ask your health care provider about: ? Changing or stopping your regular medicines. This is especially important if you are taking diabetes medicines or blood thinners. ? Taking medicines such as aspirin and ibuprofen. These medicines can thin your blood. Do not take these medicines before your procedure if your health care provider instructs you not to.  You may be given antibiotic medicine to help prevent infection. General instructions  You will have a heart evaluation. This may include an electrocardiogram (ECG), chest X-ray, and heart imaging (echocardiogram,  or echo) tests.  You will have blood tests.  Do not use any products that contain nicotine or tobacco, such as cigarettes and e-cigarettes. If you need help quitting, ask your health care provider.  Plan to have someone take you home from the hospital or clinic.  If you will be going home right after the procedure, plan to have someone with you for 24 hours.  Ask your health care provider how your surgical site will be marked or identified. What happens during the procedure?  To reduce your risk of infection: ? Your health care team will wash or sanitize their hands. ? Your skin will be washed with soap. ? Hair may be removed from the surgical area.  An IV tube will be inserted into one of your veins.  You will be given one or more of the following: ? A medicine to help you relax (sedative). ? A medicine to numb the area (local anesthetic). ? A medicine to make you fall asleep (general anesthetic).  If you are getting a transvenous pacemaker: ? An incision will be made in your upper chest. ? A pocket  will be made for the pacemaker. It may be placed under the skin or between layers of muscle. ? The lead will be inserted into a blood vessel that returns to the heart. ? While X-rays are taken by an imaging machine (fluoroscopy), the lead will be advanced through the vein to the inside of your heart. ? The other end of the lead will be tunneled under the skin and attached to the pacemaker.  If you are getting an epicardial pacemaker: ? An incision will be made near your ribs or breastbone (sternum) for the lead. ? The lead will be attached to the outside of your heart. ? Another incision will be made in your chest or upper belly to create a pocket for the pacemaker. ? The free end of the lead will be tunneled under the skin and attached to the pacemaker.  The transvenous or epicardial pacemaker will be tested. Imaging studies may be done to check the lead position.  The incisions will be closed with stitches (sutures), adhesive strips, or skin glue.  Bandages (dressing) will  be placed over the incisions. The procedure may vary among health care providers and hospitals. What happens after the procedure?  Your blood pressure, heart rate, breathing rate, and blood oxygen level will be monitored until the medicines you were given have worn off.  You will be given antibiotics and pain medicine.  ECG and chest x-rays will be done.  You will wear a continuous type of ECG (Holter monitor) to check your heart rhythm.  Your health care provider will program the pacemaker.  Do not drive for 24 hours if you received a sedative. This information is not intended to replace advice given to you by your health care provider. Make sure you discuss any questions you have with your health care provider. Document Released: 10/23/2002 Document Revised: 07/22/2018 Document Reviewed: 04/15/2016 Elsevier Interactive Patient Education  2019 [employer]

## 2018-12-12 ENCOUNTER — Other Ambulatory Visit: Payer: Self-pay | Admitting: Internal Medicine

## 2018-12-12 LAB — BASIC METABOLIC PANEL
BUN/Creatinine Ratio: 9 — ABNORMAL LOW (ref 12–28)
BUN: 8 mg/dL (ref 8–27)
CO2: 25 mmol/L (ref 20–29)
Calcium: 9 mg/dL (ref 8.7–10.3)
Chloride: 105 mmol/L (ref 96–106)
Creatinine, Ser: 0.86 mg/dL (ref 0.57–1.00)
GFR calc Af Amer: 73 mL/min/{1.73_m2} (ref >59)
GFR calc non Af Amer: 63 mL/min/{1.73_m2} (ref >59)
Glucose: 206 mg/dL — ABNORMAL HIGH (ref 65–99)
Potassium: 4.9 mmol/L (ref 3.5–5.2)
Sodium: 143 mmol/L (ref 134–144)

## 2018-12-12 LAB — CBC WITH DIFFERENTIAL/PLATELET
Basophils Absolute: 0 10*3/uL (ref 0.0–0.2)
Basos: 1 %
EOS (ABSOLUTE): 0.2 10*3/uL (ref 0.0–0.4)
Eos: 3 %
Hematocrit: 33.1 % — ABNORMAL LOW (ref 34.0–46.6)
Hemoglobin: 11.3 g/dL (ref 11.1–15.9)
Immature Grans (Abs): 0 10*3/uL (ref 0.0–0.1)
Immature Granulocytes: 0 %
Lymphocytes Absolute: 2 10*3/uL (ref 0.7–3.1)
Lymphs: 38 %
MCH: 31.3 pg (ref 26.6–33.0)
MCHC: 34.1 g/dL (ref 31.5–35.7)
MCV: 92 fL (ref 79–97)
Monocytes Absolute: 0.4 10*3/uL (ref 0.1–0.9)
Monocytes: 8 %
Neutrophils Absolute: 2.7 10*3/uL (ref 1.4–7.0)
Neutrophils: 50 %
Platelets: 52 10*3/uL — CL (ref 150–450)
RBC: 3.61 x10E6/uL — ABNORMAL LOW (ref 3.77–5.28)
RDW: 12.2 % (ref 11.7–15.4)
WBC: 5.4 10*3/uL (ref 3.4–10.8)

## 2018-12-15 DIAGNOSIS — E1065 Type 1 diabetes mellitus with hyperglycemia: Secondary | ICD-10-CM | POA: Diagnosis not present

## 2018-12-15 DIAGNOSIS — I10 Essential (primary) hypertension: Secondary | ICD-10-CM | POA: Diagnosis not present

## 2018-12-16 ENCOUNTER — Other Ambulatory Visit: Payer: Self-pay

## 2018-12-16 ENCOUNTER — Ambulatory Visit (HOSPITAL_COMMUNITY)
Admission: RE | Admit: 2018-12-16 | Discharge: 2018-12-17 | Disposition: A | Discharge status: Home / Self Care | Payer: PPO | Attending: Internal Medicine | Admitting: Internal Medicine

## 2018-12-16 ENCOUNTER — Encounter (HOSPITAL_COMMUNITY): Payer: Self-pay | Admitting: Internal Medicine

## 2018-12-16 ENCOUNTER — Ambulatory Visit (HOSPITAL_COMMUNITY)
Admission: RE | Disposition: A | Discharge status: Home / Self Care | Payer: Self-pay | Source: Home / Self Care | Attending: Internal Medicine

## 2018-12-16 DIAGNOSIS — Z794 Long term (current) use of insulin: Secondary | ICD-10-CM | POA: Diagnosis not present

## 2018-12-16 DIAGNOSIS — E119 Type 2 diabetes mellitus without complications: Secondary | ICD-10-CM

## 2018-12-16 DIAGNOSIS — E785 Hyperlipidemia, unspecified: Secondary | ICD-10-CM | POA: Insufficient documentation

## 2018-12-16 DIAGNOSIS — I2510 Atherosclerotic heart disease of native coronary artery without angina pectoris: Secondary | ICD-10-CM | POA: Insufficient documentation

## 2018-12-16 DIAGNOSIS — E039 Hypothyroidism, unspecified: Secondary | ICD-10-CM | POA: Insufficient documentation

## 2018-12-16 DIAGNOSIS — J90 Pleural effusion, not elsewhere classified: Secondary | ICD-10-CM | POA: Diagnosis not present

## 2018-12-16 DIAGNOSIS — I442 Atrioventricular block, complete: Secondary | ICD-10-CM | POA: Diagnosis not present

## 2018-12-16 DIAGNOSIS — I700 Atherosclerosis of aorta: Secondary | ICD-10-CM | POA: Insufficient documentation

## 2018-12-16 DIAGNOSIS — Z79899 Other long term (current) drug therapy: Secondary | ICD-10-CM | POA: Insufficient documentation

## 2018-12-16 DIAGNOSIS — Z8249 Family history of ischemic heart disease and other diseases of the circulatory system: Secondary | ICD-10-CM | POA: Diagnosis not present

## 2018-12-16 DIAGNOSIS — Z950 Presence of cardiac pacemaker: Secondary | ICD-10-CM

## 2018-12-16 DIAGNOSIS — F17210 Nicotine dependence, cigarettes, uncomplicated: Secondary | ICD-10-CM | POA: Diagnosis not present

## 2018-12-16 DIAGNOSIS — IMO0001 Reserved for inherently not codable concepts without codable children: Secondary | ICD-10-CM

## 2018-12-16 DIAGNOSIS — Z882 Allergy status to sulfonamides status: Secondary | ICD-10-CM | POA: Diagnosis not present

## 2018-12-16 DIAGNOSIS — E1165 Type 2 diabetes mellitus with hyperglycemia: Secondary | ICD-10-CM | POA: Insufficient documentation

## 2018-12-16 DIAGNOSIS — I441 Atrioventricular block, second degree: Secondary | ICD-10-CM | POA: Diagnosis present

## 2018-12-16 DIAGNOSIS — Z888 Allergy status to other drugs, medicaments and biological substances status: Secondary | ICD-10-CM | POA: Insufficient documentation

## 2018-12-16 HISTORY — DX: Presence of cardiac pacemaker: Z95.0

## 2018-12-16 HISTORY — DX: Bradycardia, unspecified: R00.1

## 2018-12-16 HISTORY — PX: PACEMAKER IMPLANT: EP1218

## 2018-12-16 HISTORY — DX: Hypothyroidism, unspecified: E03.9

## 2018-12-16 HISTORY — DX: Gastro-esophageal reflux disease without esophagitis: K21.9

## 2018-12-16 HISTORY — PX: INSERT / REPLACE / REMOVE PACEMAKER: SUR710

## 2018-12-16 HISTORY — DX: Atherosclerotic heart disease of native coronary artery without angina pectoris: I25.10

## 2018-12-16 LAB — SURGICAL PCR SCREEN
MRSA, PCR: NEGATIVE
Staphylococcus aureus: NEGATIVE

## 2018-12-16 LAB — GLUCOSE, CAPILLARY
Glucose-Capillary: 422 mg/dL — ABNORMAL HIGH (ref 70–99)
Glucose-Capillary: 370 mg/dL — ABNORMAL HIGH (ref 70–99)
Glucose-Capillary: 287 mg/dL — ABNORMAL HIGH (ref 70–99)
Glucose-Capillary: 260 mg/dL — ABNORMAL HIGH (ref 70–99)
Glucose-Capillary: 420 mg/dL — ABNORMAL HIGH (ref 70–99)
Glucose-Capillary: 280 mg/dL — ABNORMAL HIGH (ref 70–99)

## 2018-12-16 LAB — GLUCOSE, RANDOM: Glucose, Bld: 441 mg/dL — ABNORMAL HIGH (ref 70–99)

## 2018-12-16 SURGERY — PACEMAKER IMPLANT

## 2018-12-16 MED ORDER — SODIUM CHLORIDE 0.9 % IV SOLN
INTRAVENOUS | Status: AC
  Filled 2018-12-16: qty 2

## 2018-12-16 MED ORDER — ONDANSETRON HCL 4 MG/2ML IJ SOLN: 4.0000 mg | Freq: Four times a day (QID) | INTRAMUSCULAR | Status: DC | PRN

## 2018-12-16 MED ORDER — CEFAZOLIN SODIUM-DEXTROSE 1-4 GM/50ML-% IV SOLN
1.0000 g | Freq: Four times a day (QID) | INTRAVENOUS | Status: AC
  Administered 2018-12-16 – 2018-12-17 (×3): 1 g via INTRAVENOUS
  Filled 2018-12-16 (×3): qty 50

## 2018-12-16 MED ORDER — YOU HAVE A PACEMAKER BOOK
Freq: Once | Status: AC
  Administered 2018-12-16: 22:00:00
  Filled 2018-12-16: qty 1

## 2018-12-16 MED ORDER — MUPIROCIN 2 % EX OINT
TOPICAL_OINTMENT | CUTANEOUS | Status: AC
  Administered 2018-12-16: 09:00:00
  Filled 2018-12-16: qty 22

## 2018-12-16 MED ORDER — INSULIN ASPART 100 UNIT/ML ~~LOC~~ SOLN: 5.0000 [IU] | Freq: Once | SUBCUTANEOUS | Status: DC

## 2018-12-16 MED ORDER — LIDOCAINE HCL 1 % IJ SOLN
INTRAMUSCULAR | Status: AC
  Filled 2018-12-16: qty 60

## 2018-12-16 MED ORDER — CEFAZOLIN SODIUM-DEXTROSE 2-4 GM/100ML-% IV SOLN
INTRAVENOUS | Status: AC
  Filled 2018-12-16: qty 100

## 2018-12-16 MED ORDER — INSULIN PUMP
Freq: Three times a day (TID) | SUBCUTANEOUS | Status: DC
  Administered 2018-12-16: 22:00:00 2.7 via SUBCUTANEOUS
  Administered 2018-12-16: 17:00:00 3.6 via SUBCUTANEOUS
  Filled 2018-12-16: qty 1

## 2018-12-16 MED ORDER — INSULIN ASPART 100 UNIT/ML ~~LOC~~ SOLN: 0.0000 [IU] | SUBCUTANEOUS | Status: DC

## 2018-12-16 MED ORDER — MIDAZOLAM HCL 5 MG/5ML IJ SOLN
INTRAMUSCULAR | Status: AC
  Filled 2018-12-16: qty 5

## 2018-12-16 MED ORDER — LIDOCAINE HCL (PF) 1 % IJ SOLN
INTRAMUSCULAR | Status: DC | PRN
  Administered 2018-12-16: 60 mL

## 2018-12-16 MED ORDER — ACETAMINOPHEN 325 MG PO TABS
325.0000 mg | ORAL_TABLET | ORAL | Status: DC | PRN
  Administered 2018-12-17: 650 mg via ORAL
  Filled 2018-12-16: qty 2

## 2018-12-16 MED ORDER — LISINOPRIL 10 MG PO TABS
20.0000 mg | ORAL_TABLET | Freq: Every day | ORAL | Status: DC
  Administered 2018-12-16: 17:00:00 20 mg via ORAL
  Filled 2018-12-16: qty 2

## 2018-12-16 MED ORDER — CEFAZOLIN SODIUM-DEXTROSE 2-4 GM/100ML-% IV SOLN
2.0000 g | INTRAVENOUS | Status: AC
  Administered 2018-12-16: 2 g via INTRAVENOUS
  Filled 2018-12-16: qty 100

## 2018-12-16 MED ORDER — HEPARIN (PORCINE) IN NACL 1000-0.9 UT/500ML-% IV SOLN
INTRAVENOUS | Status: DC | PRN
  Administered 2018-12-16: 500 mL

## 2018-12-16 MED ORDER — METOPROLOL TARTRATE 5 MG/5ML IV SOLN
INTRAVENOUS | Status: DC | PRN
  Administered 2018-12-16: 5 mg via INTRAVENOUS

## 2018-12-16 MED ORDER — CITALOPRAM HYDROBROMIDE 20 MG PO TABS
20.0000 mg | ORAL_TABLET | Freq: Every day | ORAL | Status: DC
  Administered 2018-12-16: 20 mg via ORAL
  Filled 2018-12-16: qty 1

## 2018-12-16 MED ORDER — CHLORHEXIDINE GLUCONATE 4 % EX LIQD
60.0000 mL | Freq: Once | CUTANEOUS | Status: DC
  Filled 2018-12-16: qty 60

## 2018-12-16 MED ORDER — HYDRALAZINE HCL 20 MG/ML IJ SOLN: 5.0000 mg | Freq: Once | INTRAMUSCULAR | Status: DC

## 2018-12-16 MED ORDER — HYDRALAZINE HCL 20 MG/ML IJ SOLN
10.0000 mg | Freq: Once | INTRAMUSCULAR | Status: AC
  Administered 2018-12-16: 10 mg via INTRAVENOUS

## 2018-12-16 MED ORDER — SODIUM CHLORIDE 0.9 % IV SOLN
80.0000 mg | INTRAVENOUS | Status: AC
  Administered 2018-12-16: 80 mg
  Filled 2018-12-16: qty 2

## 2018-12-16 MED ORDER — INSULIN ASPART 100 UNIT/ML ~~LOC~~ SOLN
6.0000 [IU] | Freq: Once | SUBCUTANEOUS | Status: AC
  Administered 2018-12-16: 6 [IU] via SUBCUTANEOUS

## 2018-12-16 MED ORDER — INSULIN PUMP
1.0000 | SUBCUTANEOUS | Status: DC
  Filled 2018-12-16: qty 1

## 2018-12-16 MED ORDER — ADULT MULTIVITAMIN W/MINERALS CH
1.0000 | ORAL_TABLET | Freq: Every day | ORAL | Status: DC
  Administered 2018-12-16: 1 via ORAL
  Filled 2018-12-16: qty 1

## 2018-12-16 MED ORDER — LEVOTHYROXINE SODIUM 50 MCG PO TABS
50.0000 ug | ORAL_TABLET | Freq: Every day | ORAL | Status: DC
  Administered 2018-12-16 – 2018-12-17 (×2): 50 ug via ORAL
  Filled 2018-12-16 (×2): qty 1

## 2018-12-16 MED ORDER — FENTANYL CITRATE (PF) 100 MCG/2ML IJ SOLN
INTRAMUSCULAR | Status: AC
  Filled 2018-12-16: qty 2

## 2018-12-16 MED ORDER — SODIUM CHLORIDE 0.9 % IV SOLN
INTRAVENOUS | Status: DC
  Administered 2018-12-16: 11:00:00 via INTRAVENOUS

## 2018-12-16 MED ORDER — METOPROLOL TARTRATE 5 MG/5ML IV SOLN
INTRAVENOUS | Status: AC
  Filled 2018-12-16: qty 5

## 2018-12-16 MED ORDER — HEPARIN (PORCINE) IN NACL 1000-0.9 UT/500ML-% IV SOLN
INTRAVENOUS | Status: AC
  Filled 2018-12-16: qty 500

## 2018-12-16 MED ORDER — IOPAMIDOL (ISOVUE-370) INJECTION 76%
INTRAVENOUS | Status: DC | PRN
  Administered 2018-12-16: 10 mL via INTRAVENOUS

## 2018-12-16 MED ORDER — HYDRALAZINE HCL 20 MG/ML IJ SOLN
INTRAMUSCULAR | Status: AC
  Filled 2018-12-16: qty 1

## 2018-12-16 SURGICAL SUPPLY — 13 items
CABLE SURGICAL S-101-97-12 (CABLE) ×4 IMPLANT
CATH RIGHTSITE C315HIS02 (CATHETERS) ×2 IMPLANT
IPG PACE AZUR XT DR MRI W1DR01 (Pacemaker) ×1 IMPLANT
KIT MICROPUNCTURE NIT STIFF (SHEATH) ×2 IMPLANT
LEAD CAPSURE NOVUS 5076-52CM (Lead) ×2 IMPLANT
LEAD SELECT SECURE 3830 383069 (Lead) ×1 IMPLANT
PACE AZURE XT DR MRI W1DR01 (Pacemaker) ×2 IMPLANT
PAD PRO RADIOLUCENT 2001M-C (PAD) ×2 IMPLANT
SELECT SECURE 3830 383069 (Lead) ×2 IMPLANT
SHEATH CLASSIC 7F (SHEATH) ×4 IMPLANT
SLITTER 6232ADJ (MISCELLANEOUS) ×2 IMPLANT
TRAY PACEMAKER INSERTION (PACKS) ×2 IMPLANT
WIRE HI TORQ VERSACORE-J 145CM (WIRE) ×2 IMPLANT

## 2018-12-16 NOTE — Progress Notes (Signed)
Pt leaves Cath Lab holding area in stable condition. Lt chest is CDI. Family has been updated.

## 2018-12-16 NOTE — Progress Notes (Signed)
Dr Ladona Ridgel called and informed of CBG new orders noted

## 2018-12-16 NOTE — Progress Notes (Addendum)
Inpatient Diabetes Program Recommendations  AACE/ADA: New Consensus Statement on Inpatient Glycemic Control (2015)  Target Ranges:  Prepandial:   less than 140 mg/dL      Peak postprandial:   less than 180 mg/dL (1-2 hours)      Critically ill patients:  140 - 180 mg/dL   Lab Results  Component Value Date   GLUCAP 260 (H) 12/16/2018   HGBA1C 9.0 (H) 08/12/2018    Review of Glycemic Control Results for BRIDGETTE, NECOCHEA (MRN [id number]) as of 12/16/2018 15:05  Ref. Range 12/16/2018 08:03 12/16/2018 10:25 12/16/2018 13:01 12/16/2018 14:23  Glucose-Capillary Latest Ref Range: 70 - 99 mg/dL 297 (H) 989 (H) 211 (H) 260 (H)   Diabetes history: DM 1 Outpatient Diabetes medications:  Insulin pump settings:  Basal settings 12 AM 0.55 units/hr 6 am 0.7 units/hr 8 am 0.6 units/hr 5 pm 0.7 units/hr 24-hr basal = 15 units NovoLog   Bolus settings I:C 12 am 17, 11 am 15, 2 pm 12 Sensitivity 58 Target 100-120 Active insulin time 5 hrs Current orders for Inpatient glycemic control:  Insulin pump-per above settings  Inpatient Diabetes Program Recommendations:    Spoke with RN.  She states that patient was told to take her insulin pump off for cardiac cath which likely explains why blood sugar was > 400 this AM.  RN states patient is reapplying insulin pump at this time and has signed contract.  Thanks,  Beryl Meager, RN, BC-ADM Inpatient Diabetes Coordinator Pager [phone] ([id number])  [phone]- verified patient's insulin pump setting.  Reminded patient that she should not remove insulin pump without another form of insulin on board.  Daughter states "I tried to tell her".  Insulin pump now infusing.  Patient has insulin pump flow sheet at bedside.

## 2018-12-16 NOTE — Progress Notes (Signed)
Jennifer O Neal, Rn informed of CBG results  

## 2018-12-16 NOTE — Discharge Instructions (Signed)
° ° °  Supplemental Discharge Instructions for  °Pacemaker/Defibrillator Patients ° °Activity °No heavy lifting or vigorous activity with your left/right arm for 6 to 8 weeks.  Do not raise your left/right arm above your head for one week.  Gradually raise your affected arm as drawn below. ° °        °   12/20/2018                    12/21/2018                 12/22/2018                 12/23/2018  °__ ° °NO DRIVING for   1 week  ; you may begin driving on  12/23/2018   . ° °WOUND CARE °- Keep the wound area clean and dry.  Do not get this area wet for one week. No showers for one week; you may shower on  12/23/2018   . °- The tape/steri-strips on your wound will fall off; do not pull them off.  No bandage is needed on the site.  DO  NOT apply any creams, oils, or ointments to the wound area. °- If you notice any drainage or discharge from the wound, any swelling or bruising at the site, or you develop a fever > 101? F after you are discharged home, call the office at once. ° °Special Instructions °- You are still able to use cellular telephones; use the ear opposite the side where you have your pacemaker/defibrillator.  Avoid carrying your cellular phone near your device. °- When traveling through airports, show security personnel your identification card to avoid being screened in the metal detectors.  Ask the security personnel to use the hand wand. °- Avoid arc welding equipment, MRI testing (magnetic resonance imaging), TENS units (transcutaneous nerve stimulators).  Call the office for questions about other devices. °- Avoid electrical appliances that are in poor condition or are not properly grounded. °- Microwave ovens are safe to be near or to operate. ° ° °

## 2018-12-16 NOTE — Progress Notes (Signed)
[facility] 370 Madeline [name] called and informed

## 2018-12-16 NOTE — Interval H&P Note (Signed)
History and Physical Interval Note:  12/16/2018 10:53 AM  Madeline [name]  has presented today for surgery, with the diagnosis of Bradicardia  The various methods of treatment have been discussed with the patient and family. After consideration of risks, benefits and other options for treatment, the patient has consented to  Procedure(s): PACEMAKER IMPLANT (N/A) as a surgical intervention .  The patient's history has been reviewed, patient examined, no change in status, stable for surgery.  I have reviewed the patient's chart and labs.  Questions were answered to the patient's satisfaction.     Lewayne Bunting

## 2018-12-17 ENCOUNTER — Encounter (HOSPITAL_COMMUNITY): Payer: Self-pay | Admitting: Physician Assistant

## 2018-12-17 ENCOUNTER — Ambulatory Visit (HOSPITAL_COMMUNITY): Payer: PPO

## 2018-12-17 DIAGNOSIS — Z888 Allergy status to other drugs, medicaments and biological substances status: Secondary | ICD-10-CM | POA: Diagnosis not present

## 2018-12-17 DIAGNOSIS — F17210 Nicotine dependence, cigarettes, uncomplicated: Secondary | ICD-10-CM | POA: Diagnosis not present

## 2018-12-17 DIAGNOSIS — I700 Atherosclerosis of aorta: Secondary | ICD-10-CM | POA: Diagnosis not present

## 2018-12-17 DIAGNOSIS — I442 Atrioventricular block, complete: Secondary | ICD-10-CM

## 2018-12-17 DIAGNOSIS — J90 Pleural effusion, not elsewhere classified: Secondary | ICD-10-CM | POA: Diagnosis not present

## 2018-12-17 DIAGNOSIS — Z79899 Other long term (current) drug therapy: Secondary | ICD-10-CM | POA: Diagnosis not present

## 2018-12-17 DIAGNOSIS — E1165 Type 2 diabetes mellitus with hyperglycemia: Secondary | ICD-10-CM | POA: Diagnosis not present

## 2018-12-17 DIAGNOSIS — I2510 Atherosclerotic heart disease of native coronary artery without angina pectoris: Secondary | ICD-10-CM | POA: Diagnosis not present

## 2018-12-17 DIAGNOSIS — E039 Hypothyroidism, unspecified: Secondary | ICD-10-CM | POA: Diagnosis not present

## 2018-12-17 DIAGNOSIS — Z882 Allergy status to sulfonamides status: Secondary | ICD-10-CM | POA: Diagnosis not present

## 2018-12-17 DIAGNOSIS — Z794 Long term (current) use of insulin: Secondary | ICD-10-CM | POA: Diagnosis not present

## 2018-12-17 DIAGNOSIS — E785 Hyperlipidemia, unspecified: Secondary | ICD-10-CM | POA: Diagnosis not present

## 2018-12-17 LAB — GLUCOSE, CAPILLARY
Glucose-Capillary: 123 mg/dL — ABNORMAL HIGH (ref 70–99)
Glucose-Capillary: 57 mg/dL — ABNORMAL LOW (ref 70–99)
Glucose-Capillary: 163 mg/dL — ABNORMAL HIGH (ref 70–99)

## 2018-12-17 MED ORDER — ACETAMINOPHEN 325 MG PO TABS: 325.0000 mg | ORAL_TABLET | Freq: Four times a day (QID) | ORAL | Status: DC | PRN

## 2018-12-17 NOTE — Discharge Summary (Addendum)
Discharge Summary    Patient ID: Madeline [name],  MRN: [id number], DOB/AGE: 83/12/1933 83 y.o.  Admit date: 12/16/2018 Discharge date: 12/17/2018  Primary Care Provider: Marguarite ArbourSparks, Jeffrey D Primary Cardiologist: Dietrich PatesPaula Ross, MD Electrophysiologist:  Lewayne BuntingGregg Taylor, MD   Discharge Diagnoses    Principal Problem:   Complete heart block [facility](HCC) Active Problems:   AV block, Mobitz 2   Allergies Allergies  Allergen Reactions  . Darvon     "FEEL SICK"  . Propoxyphene Nausea Only and Nausea And Vomiting  . Sulfa Antibiotics Nausea Only and Nausea And Vomiting    "FEEL SICK"     Diagnostic Studies/Procedures    PROCEDURES: 12/16/2018 1. Left upper extremity venography.  2. Pacemaker implantation.  CONCLUSIONS:   1. Successful implantation of a Medtronic dual-chamber pacemaker for symptomatic bradycardia due to mobitz 2, second degree AV block  2. No early apparent complications.  _____________   History of Present Illness     85 yo female w/ hx DM, HLD, hypothyroid saw her PCP and her heart rate was in the 40s.  She continued to have a low heart rate despite discontinuation of a beta-blocker.  She had a Holter monitor placed and was found to have both Mobitz 2 second-degree AV block and complete heart block.  She was seen by Dr. Ladona Ridgelaylor on 1/24 and scheduled for a pacemaker.  She came to the hospital for the procedure on 12/16/2018.  Hospital Course     Consultants: None  She had a Medtronic dual lead pacemaker inserted without immediate complication.  She tolerated the procedure well.  On 12/17/2018, she was evaluated by Dr. Elberta Fortisamnitz and all data were reviewed.  Her chest x-ray was without significant abnormality and the leads were in good position.    Her blood sugar was elevated, but she is to resume her home medications and increase her insulin (from the pump) now that she is no longer n.p.o.  She is to follow-up with her PCP for this.  Her incision was without ecchymosis or  hematoma.  No further inpatient work-up is indicated and she is considered stable for discharge, to follow-up as an outpatient. _____________  Discharge Vitals Blood pressure (!) 152/54, pulse 74, temperature 98.4 F (36.9 C), temperature source Oral, resp. rate 18, height 5\' 2"  (1.575 m), weight 51.6 kg, SpO2 97 %.  Filed Weights   12/16/18 0759 12/17/18 0603  Weight: 47.2 kg 51.6 kg  General: Well developed, well nourished, female in no acute distress Head: Eyes PERRLA, No xanthomas.   Normocephalic and atraumatic Lungs: Clear bilaterally to auscultation. Heart: HRRR S1 S2, without MRG.  Pulses are 2+ & equal. No JVD. Abdomen: Bowel sounds are present, abdomen soft and non-tender without masses or  hernias noted. Msk: Normal strength and tone for age. Extremities: No clubbing, cyanosis or edema.    Skin:  No rashes or lesions noted. Neuro: Alert and oriented X 3. Psych:  Good affect, responds appropriately   Labs & Radiologic Studies    Basic Metabolic Panel Recent Labs    78/29/5601/31/20 0910  GLUCOSE 441*   Thyroid Function Tests TSH  Date Value Ref Range Status  09/20/2018 1.850 0.350 - 4.500 uIU/mL Final    Comment:    Performed by a 3rd Generation assay with a functional sensitivity of <=0.01 uIU/mL. Performed at [facility], [address] [place], [place] [id number]   10/22/2014 1.36 0.35 - 4.50 uIU/mL Final   _____________  Dg Chest 2 View  Result Date: 12/17/2018 CLINICAL DATA:  Pacemaker in situ. EXAM: CHEST - 2 VIEW COMPARISON:  Radiographs of September 20, 2018. FINDINGS: The heart size and mediastinal contours are within normal limits. Atherosclerosis of thoracic aorta is noted. Interval placement of left-sided pacemaker with leads in grossly good position. No pneumothorax is noted. No consolidative process is noted. Minimal bilateral pleural effusions are noted. The visualized skeletal structures are unremarkable. IMPRESSION: Interval placement of left-sided  pacemaker with leads in grossly good position. Minimal bilateral pleural effusions. Aortic Atherosclerosis (ICD10-I70.0). Electronically Signed   By: Lupita Raider, M.D.   On: 12/17/2018 07:35   Disposition   Pt is being discharged home today in good condition.  Follow-up Plans & Appointments    Follow-up Information    [facility] Office Follow up.   Specialty:  Cardiology Why:  12/30/2018 @ 11:00AM Contact information: [address] [place] [address] [phone]       Marinus Maw, MD Follow up.   Specialty:  Cardiology Why:  03/17/2019 @ 1:45PM Contact information: [address] [place] [address] [phone]          Discharge Instructions    Call MD for:  redness, tenderness, or signs of infection (pain, swelling, redness, odor or green/yellow discharge around incision site)   Complete by:  As directed    Diet - low sodium heart healthy   Complete by:  As directed    Diet Carb Modified   Complete by:  As directed    Increase activity slowly   Complete by:  As directed       Discharge Medications   Allergies as of 12/17/2018      Reactions   Darvon    "FEEL SICK"   Propoxyphene Nausea Only, Nausea And Vomiting   Sulfa Antibiotics Nausea Only, Nausea And Vomiting   "FEEL SICK"       Medication List    TAKE these medications   acetaminophen 325 MG tablet Commonly known as:  TYLENOL Take 1-2 tablets (325-650 mg total) by mouth every 6 (six) hours as needed for mild pain or moderate pain.   atorvastatin 20 MG tablet Commonly known as:  LIPITOR TAKE 1 TABLET BY MOUTH ONCE DAILY. PLEASE KEEP UPCOMING APPOINTMENT FOR FUTURE REFILLS   cetirizine 10 MG tablet Commonly known as:  ZYRTEC Take 10 mg by mouth daily.   citalopram 20 MG tablet Commonly known as:  CELEXA Take 20 mg by mouth daily.   insulin pump Soln Inject 1 each into the skin continuous. Used with Novolog insulin     levothyroxine 50 MCG tablet Commonly known as:  SYNTHROID, LEVOTHROID Take 50 mcg by mouth daily before breakfast.   lisinopril 20 MG tablet Commonly known as:  PRINIVIL,ZESTRIL Take 1 tablet (20 mg total) by mouth daily.   multivitamin with minerals Tabs tablet Take 1 tablet by mouth daily.   NOVOLOG 100 UNIT/ML injection Generic drug:  insulin aspart Inject 0-50 Units into the skin continuous. Up to 50 units/ day via pump          Outstanding Labs/Studies   None  Duration of Discharge Encounter   Greater than 30 minutes including physician time.  Bobbye Riggs Barrett NP 12/17/2018, 8:18 AM  I have seen and examined this patient with Theodore Demark.  Agree with above, note added to reflect my findings.  On exam, RRR, no murmurs, lungs clear.  She is now status post Medtronic  pacemaker.  Device functioning appropriately.  Chest x-ray and interrogation without issue.  Plan for discharge today with follow-up in device clinic.  Madalena Kesecker M. Brendan Gadson MD 12/17/2018 10:26 AM

## 2018-12-19 MED FILL — Lidocaine HCl Local Inj 1%: INTRAMUSCULAR | Qty: 60 | Status: AC

## 2018-12-22 DIAGNOSIS — E038 Other specified hypothyroidism: Secondary | ICD-10-CM | POA: Diagnosis not present

## 2018-12-22 DIAGNOSIS — Z9641 Presence of insulin pump (external) (internal): Secondary | ICD-10-CM | POA: Diagnosis not present

## 2018-12-22 DIAGNOSIS — F17200 Nicotine dependence, unspecified, uncomplicated: Secondary | ICD-10-CM | POA: Diagnosis not present

## 2018-12-22 DIAGNOSIS — I10 Essential (primary) hypertension: Secondary | ICD-10-CM | POA: Diagnosis not present

## 2018-12-22 DIAGNOSIS — E1043 Type 1 diabetes mellitus with diabetic autonomic (poly)neuropathy: Secondary | ICD-10-CM | POA: Diagnosis not present

## 2018-12-22 DIAGNOSIS — E063 Autoimmune thyroiditis: Secondary | ICD-10-CM | POA: Diagnosis not present

## 2018-12-22 DIAGNOSIS — E1065 Type 1 diabetes mellitus with hyperglycemia: Secondary | ICD-10-CM | POA: Diagnosis not present

## 2018-12-22 LAB — HM DIABETES EYE EXAM

## 2018-12-29 ENCOUNTER — Ambulatory Visit: Payer: PPO | Admitting: Internal Medicine

## 2018-12-29 DIAGNOSIS — E1065 Type 1 diabetes mellitus with hyperglycemia: Secondary | ICD-10-CM | POA: Diagnosis not present

## 2018-12-30 ENCOUNTER — Ambulatory Visit (INDEPENDENT_AMBULATORY_CARE_PROVIDER_SITE_OTHER): Payer: PPO | Admitting: Nurse Practitioner

## 2018-12-30 DIAGNOSIS — R001 Bradycardia, unspecified: Secondary | ICD-10-CM

## 2018-12-30 LAB — CUP PACEART INCLINIC DEVICE CHECK
Date Time Interrogation Session: 20200214111621
Implantable Lead Implant Date: 20200131
Implantable Lead Implant Date: 20200131
Implantable Lead Location: 753859
Implantable Lead Location: 753860
Implantable Lead Model: 3830
Implantable Lead Model: 5076
Implantable Pulse Generator Implant Date: 20200131

## 2018-12-30 NOTE — Progress Notes (Signed)
Wound check appointment. Steri-strips removed. Wound without redness or edema. Incision edges approximated, wound well healed. 1 stitch removed. Normal device function. Thresholds, sensing, and impedances consistent with implant measurements. Device programmed at 3.5V/auto capture programmed on for extra safety margin until 3 month visit. HIS bundle capture non selective down to loss of capture at 1.25V@1msec . Histogram distribution appropriate for patient and level of activity. No mode switches or high ventricular rates noted. Patient educated about wound care, arm mobility, lifting restrictions. ROV in 3 months with implanting physician.

## 2018-12-30 NOTE — Patient Instructions (Signed)
Medication Instructions:  none If you need a refill on your cardiac medications before your next appointment, please call your pharmacy.   Lab work: none If you have labs (blood work) drawn today and your tests are completely normal, you will receive your results only by: Marland Kitchen MyChart Message (if you have MyChart) OR . A paper copy in the mail If you have any lab test that is abnormal or we need to change your treatment, we will call you to review the results.  Testing/Procedures: none  Follow-Up: At [facility], you and your health needs are our priority.  As part of our continuing mission to provide you with exceptional heart care, we have created designated Provider Care Teams.  These Care Teams include your primary Cardiologist (physician) and Advanced Practice Providers (APPs -  Physician Assistants and Nurse Practitioners) who all work together to provide you with the care you need, when you need it. .   Any Other Special Instructions Will Be Listed Below (If Applicable). Please wash your incision twice per day with soap and water.Marland Kitchen

## 2019-01-13 ENCOUNTER — Ambulatory Visit (HOSPITAL_COMMUNITY): Payer: PPO

## 2019-01-13 ENCOUNTER — Other Ambulatory Visit (HOSPITAL_COMMUNITY): Payer: PPO

## 2019-01-17 ENCOUNTER — Ambulatory Visit (HOSPITAL_COMMUNITY): Payer: PPO | Admitting: Nurse Practitioner

## 2019-01-17 ENCOUNTER — Other Ambulatory Visit: Payer: Self-pay

## 2019-01-17 ENCOUNTER — Inpatient Hospital Stay (HOSPITAL_BASED_OUTPATIENT_CLINIC_OR_DEPARTMENT_OTHER): Payer: PPO | Admitting: Hematology

## 2019-01-17 ENCOUNTER — Encounter (HOSPITAL_COMMUNITY): Payer: Self-pay | Admitting: Hematology

## 2019-01-17 ENCOUNTER — Inpatient Hospital Stay (HOSPITAL_COMMUNITY): Payer: PPO | Attending: Nurse Practitioner

## 2019-01-17 VITALS — BP 146/65 | HR 81 | Temp 98.3°F | Resp 16 | Wt 112.3 lb

## 2019-01-17 DIAGNOSIS — R911 Solitary pulmonary nodule: Secondary | ICD-10-CM | POA: Insufficient documentation

## 2019-01-17 DIAGNOSIS — R001 Bradycardia, unspecified: Secondary | ICD-10-CM | POA: Diagnosis not present

## 2019-01-17 DIAGNOSIS — D693 Immune thrombocytopenic purpura: Secondary | ICD-10-CM | POA: Diagnosis not present

## 2019-01-17 LAB — CBC WITH DIFFERENTIAL/PLATELET
Abs Immature Granulocytes: 0.01 10*3/uL (ref 0.00–0.07)
Basophils Absolute: 0 10*3/uL (ref 0.0–0.1)
Basophils Relative: 1 %
Eosinophils Absolute: 0.2 10*3/uL (ref 0.0–0.5)
Eosinophils Relative: 3 %
HCT: 38.5 % (ref 36.0–46.0)
Hemoglobin: 12 g/dL (ref 12.0–15.0)
Immature Granulocytes: 0 %
Lymphocytes Relative: 40 %
Lymphs Abs: 2.1 10*3/uL (ref 0.7–4.0)
MCH: 29.5 pg (ref 26.0–34.0)
MCHC: 31.2 g/dL (ref 30.0–36.0)
MCV: 94.6 fL (ref 80.0–100.0)
Monocytes Absolute: 0.5 10*3/uL (ref 0.1–1.0)
Monocytes Relative: 9 %
Neutro Abs: 2.5 10*3/uL (ref 1.7–7.7)
Neutrophils Relative %: 47 %
Platelets: 42 10*3/uL — ABNORMAL LOW (ref 150–400)
RBC: 4.07 MIL/uL (ref 3.87–5.11)
RDW: 12.8 % (ref 11.5–15.5)
WBC: 5.3 10*3/uL (ref 4.0–10.5)
nRBC: 0 % (ref 0.0–0.2)

## 2019-01-17 LAB — COMPREHENSIVE METABOLIC PANEL
ALT: 24 U/L (ref 0–44)
AST: 30 U/L (ref 15–41)
Albumin: 3.7 g/dL (ref 3.5–5.0)
Alkaline Phosphatase: 66 U/L (ref 38–126)
Anion gap: 6 (ref 5–15)
BUN: 9 mg/dL (ref 8–23)
CO2: 29 mmol/L (ref 22–32)
Calcium: 8.9 mg/dL (ref 8.9–10.3)
Chloride: 103 mmol/L (ref 98–111)
Creatinine, Ser: 0.74 mg/dL (ref 0.44–1.00)
GFR calc Af Amer: >60 mL/min (ref >60)
GFR calc non Af Amer: >60 mL/min (ref >60)
Glucose, Bld: 208 mg/dL — ABNORMAL HIGH (ref 70–99)
Potassium: 4.6 mmol/L (ref 3.5–5.1)
Sodium: 138 mmol/L (ref 135–145)
Total Bilirubin: 0.5 mg/dL (ref 0.3–1.2)
Total Protein: 6.3 g/dL — ABNORMAL LOW (ref 6.5–8.1)

## 2019-01-17 LAB — LACTATE DEHYDROGENASE: LDH: 164 U/L (ref 98–192)

## 2019-01-17 NOTE — Progress Notes (Signed)
[facility] [address], [place] [address]   CLINIC:  Medical Oncology/Hematology  PCP:  Marguarite Arbour, MD [address] [facility] [place] [address] [phone]   REASON FOR VISIT: Follow-up for Chronic ITP with exacerbations  CURRENT THERAPY: Observation   INTERVAL HISTORY:  [name] 85 y.o. female returns for routine follow-up for chronic ITP.  Denies any nosebleeds or bleeding per rectum.  Has easy bruising on upper extremities.  Appetite is 100% and energy levels are 75%.  Had a pacemaker placed 6 weeks ago without any complications.  Denies any hospitalizations or infections.  Denies any fevers, night sweats or weight loss.  REVIEW OF SYSTEMS:  Review of Systems  All other systems reviewed and are negative.    PAST MEDICAL/SURGICAL HISTORY:  Past Medical History:  Diagnosis Date  . Bradycardia   . Coronary artery disease   . Diabetes mellitus   . Dyslipidemia   . GERD (gastroesophageal reflux disease)   . Hypothyroid   . Presence of permanent cardiac pacemaker 12/16/2018   Medtronic dual lead pacemaker   Past Surgical History:  Procedure Laterality Date  . APPENDECTOMY    . BREAST BIOPSY Right 1967   EXCISIONAL - NEG  . CHOLECYSTECTOMY    . CORONARY STENT INTERVENTION    . INSERT / REPLACE / REMOVE PACEMAKER  12/16/2018  . no surgical hx    . PACEMAKER IMPLANT N/A 12/16/2018   Procedure: PACEMAKER IMPLANT;  Surgeon: Marinus Maw, MD;  Location: [facility] INVASIVE CV LAB;  Service: Cardiovascular;  Laterality: N/A;     SOCIAL HISTORY:  Social History   Socioeconomic History  . Marital status: Married    Spouse name: Not on file  . Number of children: Not on file  . Years of education: Not on file  . Highest education level: Not on file  Occupational History  . Not on file  Social Needs  . Financial resource strain: Not on file  . Food insecurity:    Worry: Not on file    Inability: Not on file  .  Transportation needs:    Medical: Not on file    Non-medical: Not on file  Tobacco Use  . Smoking status: Current Every Day Smoker    Packs/day: 1.00    Years: 50.00    Pack years: 50.00    Types: Cigarettes  . Smokeless tobacco: Never Used  Substance and Sexual Activity  . Alcohol use: No  . Drug use: No  . Sexual activity: Not on file  Lifestyle  . Physical activity:    Days per week: Not on file    Minutes per session: Not on file  . Stress: Not on file  Relationships  . Social connections:    Talks on phone: Not on file    Gets together: Not on file    Attends religious service: Not on file    Active member of club or organization: Not on file    Attends meetings of clubs or organizations: Not on file    Relationship status: Not on file  . Intimate partner violence:    Fear of current or ex partner: Not on file    Emotionally abused: Not on file    Physically abused: Not on file    Forced sexual activity: Not on file  Other Topics Concern  . Not on file  Social History Narrative  . Not on file    FAMILY HISTORY:  Family History  Problem Relation Age of Onset  . Heart Problems Mother   . Other Father        UNK  . Cancer Sister        BREAST  . Breast cancer Sister [id number]  . Cancer Sister        BREAST  . Breast cancer Sister [id number]  . Cancer Sister        BREAST  . Diabetes Son     CURRENT MEDICATIONS:  Outpatient Encounter Medications as of 01/17/2019  Medication Sig Note  . acetaminophen (TYLENOL) 325 MG tablet Take 1-2 tablets (325-650 mg total) by mouth every 6 (six) hours as needed for mild pain or moderate pain.   Marland Kitchen. atorvastatin (LIPITOR) 20 MG tablet TAKE 1 TABLET BY MOUTH ONCE DAILY. PLEASE KEEP UPCOMING APPOINTMENT FOR FUTURE REFILLS   . cetirizine (ZYRTEC) 10 MG tablet Take 10 mg by mouth daily.   . Insulin Human (INSULIN PUMP) SOLN Inject 1 each into the skin continuous. Used with Novolog insulin 12/16/2018: Took off at 2300  . levothyroxine  (SYNTHROID, LEVOTHROID) 50 MCG tablet Take 50 mcg by mouth daily before breakfast.    . lisinopril (PRINIVIL,ZESTRIL) 20 MG tablet Take 1 tablet (20 mg total) by mouth daily.   . Multiple Vitamin (MULTIVITAMIN WITH MINERALS) TABS tablet Take 1 tablet by mouth daily.   Marland Kitchen. NOVOLOG 100 UNIT/ML injection Inject 0-50 Units into the skin continuous. Up to 50 units/ day via pump 12/16/2018: Took off at 2300  . citalopram (CELEXA) 20 MG tablet Take 20 mg by mouth daily.      No facility-administered encounter medications on file as of 01/17/2019.     ALLERGIES:  Allergies  Allergen Reactions  . Darvon     "FEEL SICK"  . Propoxyphene Nausea Only and Nausea And Vomiting  . Sulfa Antibiotics Nausea Only and Nausea And Vomiting    "FEEL SICK"      PHYSICAL EXAM:  ECOG Performance status: 1  Vitals:   01/17/19 1123  BP: (!) 146/65  Pulse: 81  Resp: 16  Temp: 98.3 F (36.8 C)  SpO2: 99%   Filed Weights   01/17/19 1123  Weight: 112 lb 4.8 oz (50.9 kg)    Physical Exam Constitutional:      Appearance: She is well-developed.  Cardiovascular:     Rate and Rhythm: Normal rate and regular rhythm.     Heart sounds: Normal heart sounds.  Pulmonary:     Effort: Pulmonary effort is normal.     Breath sounds: Normal breath sounds.  Musculoskeletal: Normal range of motion.  Skin:    General: Skin is warm and dry.  Neurological:     Mental Status: She is alert and oriented to person, place, and time.  Psychiatric:        Behavior: Behavior normal.        Thought Content: Thought content normal.        Judgment: Judgment normal.   Ecchymosis on the upper extremities present.   LABORATORY DATA:  I have reviewed the labs as listed.  CBC    Component Value Date/Time   WBC 5.3 01/17/2019 1014   RBC 4.07 01/17/2019 1014   HGB 12.0 01/17/2019 1014   HGB 11.3 12/09/2018 1308   HCT 38.5 01/17/2019 1014   HCT 33.1 (L) 12/09/2018 1308   PLT 42 (L) 01/17/2019 1014   PLT 52 (LL) 12/09/2018  1308   MCV 94.6 01/17/2019 1014   MCV 92 12/09/2018  1308   MCH 29.5 01/17/2019 1014   MCHC 31.2 01/17/2019 1014   RDW 12.8 01/17/2019 1014   RDW 12.2 12/09/2018 1308   LYMPHSABS 2.1 01/17/2019 1014   LYMPHSABS 2.0 12/09/2018 1308   MONOABS 0.5 01/17/2019 1014   EOSABS 0.2 01/17/2019 1014   EOSABS 0.2 12/09/2018 1308   BASOSABS 0.0 01/17/2019 1014   BASOSABS 0.0 12/09/2018 1308   CMP Latest Ref Rng & Units 01/17/2019 12/16/2018 12/09/2018  Glucose 70 - 99 mg/dL 010(X) 323(F) 573(U)  BUN 8 - 23 mg/dL 9 - 8  Creatinine 2.02 - 1.00 mg/dL 5.42 - 7.06  Sodium 237 - 145 mmol/L 138 - 143  Potassium 3.5 - 5.1 mmol/L 4.6 - 4.9  Chloride 98 - 111 mmol/L 103 - 105  CO2 22 - 32 mmol/L 29 - 25  Calcium 8.9 - 10.3 mg/dL 8.9 - 9.0  Total Protein 6.5 - 8.1 g/dL 6.3(L) - -  Total Bilirubin 0.3 - 1.2 mg/dL 0.5 - -  Alkaline Phos 38 - 126 U/L 66 - -  AST 15 - 41 U/L 30 - -  ALT 0 - 44 U/L 24 - -           ASSESSMENT & PLAN:   Chronic ITP (idiopathic thrombocytopenia) (HCC) 1.  Chronic ITP with exacerbations: Patient had thrombocytopenia since July 2016.  CT abdomen on 02/02/2018 showed normal-sized spleen.  Bone marrow aspiration and biopsy in 09/15/2017 showed hypercellular marrow for age, trilineage hematopoiesis with abundant megakaryocytes with no dysplasia. - Prednisone tapering dose over 5 weeks given on 12/14/2017 with no improvement in platelet count - IVIG (1 g/kg) on 12/23/2017 and 12/24/2017 with peak platelet count of 92, response lasting about 3 weeks -Dexamethasone 40 mg for 4 days on 01/11/2018 with improvement in platelet count of 61. - She was recently hospitalized from 09/20/2018 through 09/22/2018 for bradycardia. - She denies any bleeding problems.  She does have easy bruising. -We reviewed her blood work today.  Platelet count is down to 42.  She had platelet counts as low as 42 previously which has improved above 50.  She reportedly had a pacemaker placed 6 weeks ago without any  incidents. -She was told to come back sooner should she develop any bleeding.  We will see her back in 3 months with repeat blood work.  2.  Right middle lobe lung nodule: -CT of the chest on 08/08/2018 showed right middle lobe lung nodule with one-year follow-up recommended.        Orders placed this encounter:  Orders Placed This Encounter  Procedures  . CBC with Differential  . Lactate dehydrogenase      Doreatha Massed, MD [facility] [phone]

## 2019-01-17 NOTE — Assessment & Plan Note (Addendum)
1.  Chronic ITP with exacerbations: Patient had thrombocytopenia since July 2016.  CT abdomen on 02/02/2018 showed normal-sized spleen.  Bone marrow aspiration and biopsy in 09/15/2017 showed hypercellular marrow for age, trilineage hematopoiesis with abundant megakaryocytes with no dysplasia. - Prednisone tapering dose over 5 weeks given on 12/14/2017 with no improvement in platelet count - IVIG (1 g/kg) on 12/23/2017 and 12/24/2017 with peak platelet count of 92, response lasting about 3 weeks -Dexamethasone 40 mg for 4 days on 01/11/2018 with improvement in platelet count of 61. - She was recently hospitalized from 09/20/2018 through 09/22/2018 for bradycardia. - She denies any bleeding problems.  She does have easy bruising. -We reviewed her blood work today.  Platelet count is down to 42.  She had platelet counts as low as 42 previously which has improved above 50.  She reportedly had a pacemaker placed 6 weeks ago without any incidents. -She was told to come back sooner should she develop any bleeding.  We will see her back in 3 months with repeat blood work.  2.  Right middle lobe lung nodule: -CT of the chest on 08/08/2018 showed right middle lobe lung nodule with one-year follow-up recommended.

## 2019-03-07 ENCOUNTER — Telehealth: Payer: Self-pay

## 2019-03-07 NOTE — Telephone Encounter (Signed)
Call placed to Madeline [name].  Advised needed to move her appt to a day Dr. Ladona Ridgel was in the office.  Madeline [name] did not want May 6 or June 4.  States her husband has appt's those day.s  Madeline [name] wanted June 23.2020.  Scheduled for 9 am.  Advises to call office if any issues.

## 2019-03-17 ENCOUNTER — Ambulatory Visit (INDEPENDENT_AMBULATORY_CARE_PROVIDER_SITE_OTHER): Payer: PPO | Admitting: *Deleted

## 2019-03-17 ENCOUNTER — Encounter: Payer: PPO | Admitting: Internal Medicine

## 2019-03-17 ENCOUNTER — Other Ambulatory Visit: Payer: Self-pay

## 2019-03-17 DIAGNOSIS — R001 Bradycardia, unspecified: Secondary | ICD-10-CM | POA: Diagnosis not present

## 2019-03-17 LAB — CUP PACEART REMOTE DEVICE CHECK
Battery Remaining Longevity: 41 mo
Battery Voltage: 3.02 V
Brady Statistic AP VP Percent: 0.72 %
Brady Statistic AP VS Percent: 0 %
Brady Statistic AS VP Percent: 99.22 %
Brady Statistic AS VS Percent: 0.06 %
Brady Statistic RA Percent Paced: 0.71 %
Brady Statistic RV Percent Paced: 99.94 %
Date Time Interrogation Session: 20200501144156
Implantable Lead Implant Date: 20200131
Implantable Lead Implant Date: 20200131
Implantable Lead Location: 753859
Implantable Lead Location: 753860
Implantable Lead Model: 3830
Implantable Lead Model: 5076
Implantable Pulse Generator Implant Date: 20200131
Lead Channel Impedance Value: 304 Ohm
Lead Channel Impedance Value: 342 Ohm
Lead Channel Impedance Value: 399 Ohm
Lead Channel Impedance Value: 475 Ohm
Lead Channel Pacing Threshold Amplitude: 0.375 V
Lead Channel Pacing Threshold Amplitude: 0.75 V
Lead Channel Pacing Threshold Pulse Width: 0.4 ms
Lead Channel Pacing Threshold Pulse Width: 0.4 ms
Lead Channel Sensing Intrinsic Amplitude: 2.25 mV
Lead Channel Sensing Intrinsic Amplitude: 2.25 mV
Lead Channel Setting Pacing Amplitude: 3.25 V
Lead Channel Setting Pacing Amplitude: 5 V
Lead Channel Setting Pacing Pulse Width: 1 ms
Lead Channel Setting Sensing Sensitivity: 4 mV

## 2019-03-22 ENCOUNTER — Encounter: Payer: Self-pay | Admitting: Cardiology

## 2019-03-22 NOTE — Progress Notes (Signed)
Remote pacemaker transmission.   

## 2019-03-24 DIAGNOSIS — Z79899 Other long term (current) drug therapy: Secondary | ICD-10-CM | POA: Diagnosis not present

## 2019-03-24 DIAGNOSIS — E1065 Type 1 diabetes mellitus with hyperglycemia: Secondary | ICD-10-CM | POA: Diagnosis not present

## 2019-03-24 DIAGNOSIS — I10 Essential (primary) hypertension: Secondary | ICD-10-CM | POA: Diagnosis not present

## 2019-03-24 DIAGNOSIS — E039 Hypothyroidism, unspecified: Secondary | ICD-10-CM | POA: Diagnosis not present

## 2019-03-24 DIAGNOSIS — J431 Panlobular emphysema: Secondary | ICD-10-CM | POA: Diagnosis not present

## 2019-03-24 DIAGNOSIS — Z0000 Encounter for general adult medical examination without abnormal findings: Secondary | ICD-10-CM | POA: Diagnosis not present

## 2019-03-24 DIAGNOSIS — E7800 Pure hypercholesterolemia, unspecified: Secondary | ICD-10-CM | POA: Diagnosis not present

## 2019-03-24 LAB — HEMOGLOBIN A1C: Hemoglobin A1C: 7.9

## 2019-03-27 ENCOUNTER — Other Ambulatory Visit: Payer: Self-pay | Admitting: Internal Medicine

## 2019-03-29 DIAGNOSIS — E1065 Type 1 diabetes mellitus with hyperglycemia: Secondary | ICD-10-CM | POA: Diagnosis not present

## 2019-03-30 DIAGNOSIS — E063 Autoimmune thyroiditis: Secondary | ICD-10-CM | POA: Diagnosis not present

## 2019-03-30 DIAGNOSIS — I10 Essential (primary) hypertension: Secondary | ICD-10-CM | POA: Diagnosis not present

## 2019-03-30 DIAGNOSIS — Z9641 Presence of insulin pump (external) (internal): Secondary | ICD-10-CM | POA: Diagnosis not present

## 2019-03-30 DIAGNOSIS — F17200 Nicotine dependence, unspecified, uncomplicated: Secondary | ICD-10-CM | POA: Diagnosis not present

## 2019-03-30 DIAGNOSIS — E038 Other specified hypothyroidism: Secondary | ICD-10-CM | POA: Diagnosis not present

## 2019-03-30 DIAGNOSIS — E1043 Type 1 diabetes mellitus with diabetic autonomic (poly)neuropathy: Secondary | ICD-10-CM | POA: Diagnosis not present

## 2019-03-31 ENCOUNTER — Telehealth: Payer: Self-pay | Admitting: Internal Medicine

## 2019-03-31 NOTE — Telephone Encounter (Signed)
Patient stated she had a seizure early this morning aorund 2:00 am. Patient stated EMS told her to call her doctors and let them know. Patient stated her BP and HR are good this morning and she is feeling fine now, 122/70 and 93. Patient stated if we need to call her back to use her cell phone number. [phone]. Will forward to Dr. Tenny Craw, Dr. Ladona Ridgel, and device clinic so they are aware.

## 2019-03-31 NOTE — Telephone Encounter (Signed)
New message    Patient c/o Palpitations:  High priority if patient c/o lightheadedness, shortness of breath, or chest pain  1) How long have you had palpitations/irregular HR/ Afib? Are you having the symptoms now?Patient does not know how long she has had irregular hr, no  2) Are you currently experiencing lightheadedness, SOB or CP? No   3) Do you have a history of afib (atrial fibrillation) or irregular heart rhythm? yes  4) Have you checked your BP or HR? (document readings if available): no   5) Are you experiencing any other symptoms?no

## 2019-03-31 NOTE — Telephone Encounter (Signed)
Spoke with patient. She states that she was lying in her bed asleep and her husband was awake. He reports that she started 'jumping all over' and it lasted for a few minutes. She is back to normal this morning.  Manual device interrogation reviewed. No episodes, lead parameters stable. Follow up in office with Dr Ladona Ridgel as scheduled.  Gypsy Balsam, NP 03/31/2019 9:12 AM

## 2019-04-01 NOTE — Telephone Encounter (Signed)
Called pt She is feeling OK NO further spells  Device interrogated   OK  No events TOld her to relay information to her primary MD   She plans to call him on Monday

## 2019-04-13 ENCOUNTER — Telehealth: Payer: Self-pay | Admitting: Internal Medicine

## 2019-04-13 DIAGNOSIS — M79675 Pain in left toe(s): Secondary | ICD-10-CM | POA: Diagnosis not present

## 2019-04-13 DIAGNOSIS — M2011 Hallux valgus (acquired), right foot: Secondary | ICD-10-CM | POA: Diagnosis not present

## 2019-04-13 DIAGNOSIS — M2012 Hallux valgus (acquired), left foot: Secondary | ICD-10-CM | POA: Diagnosis not present

## 2019-04-13 DIAGNOSIS — L600 Ingrowing nail: Secondary | ICD-10-CM | POA: Diagnosis not present

## 2019-04-13 DIAGNOSIS — B351 Tinea unguium: Secondary | ICD-10-CM | POA: Diagnosis not present

## 2019-04-13 DIAGNOSIS — M79674 Pain in right toe(s): Secondary | ICD-10-CM | POA: Diagnosis not present

## 2019-04-13 DIAGNOSIS — E119 Type 2 diabetes mellitus without complications: Secondary | ICD-10-CM | POA: Diagnosis not present

## 2019-04-13 NOTE — Telephone Encounter (Signed)
New message ° ° ° °LMOM for pt to return call about appt on 06.04.20 with Dr. Taylor. Need to RS appt per RN, will offer appt with Amber Seiler. When pt returns call, please reach out via secure chat. I will speak to patient.  °

## 2019-04-17 NOTE — Telephone Encounter (Signed)
Follow up     [facility] for pt to call back and RS appt on 06.04.20 with Dr. Ladona Ridgel. Will offer appt with Gypsy Balsam, pt needs to be seen in the office. If pt returns call, please reach out via secure chat. I will speak to patient.

## 2019-04-20 ENCOUNTER — Inpatient Hospital Stay (HOSPITAL_COMMUNITY): Payer: PPO | Attending: Hematology

## 2019-04-20 ENCOUNTER — Other Ambulatory Visit: Payer: Self-pay

## 2019-04-20 ENCOUNTER — Encounter: Payer: PPO | Admitting: Internal Medicine

## 2019-04-20 DIAGNOSIS — R911 Solitary pulmonary nodule: Secondary | ICD-10-CM | POA: Diagnosis not present

## 2019-04-20 DIAGNOSIS — D693 Immune thrombocytopenic purpura: Secondary | ICD-10-CM | POA: Diagnosis not present

## 2019-04-20 LAB — CBC WITH DIFFERENTIAL/PLATELET
Abs Immature Granulocytes: 0.01 10*3/uL (ref 0.00–0.07)
Basophils Absolute: 0 10*3/uL (ref 0.0–0.1)
Basophils Relative: 1 %
Eosinophils Absolute: 0.1 10*3/uL (ref 0.0–0.5)
Eosinophils Relative: 2 %
HCT: 36 % (ref 36.0–46.0)
Hemoglobin: 11.4 g/dL — ABNORMAL LOW (ref 12.0–15.0)
Immature Granulocytes: 0 %
Lymphocytes Relative: 35 %
Lymphs Abs: 1.9 10*3/uL (ref 0.7–4.0)
MCH: 30 pg (ref 26.0–34.0)
MCHC: 31.7 g/dL (ref 30.0–36.0)
MCV: 94.7 fL (ref 80.0–100.0)
Monocytes Absolute: 0.5 10*3/uL (ref 0.1–1.0)
Monocytes Relative: 9 %
Neutro Abs: 2.7 10*3/uL (ref 1.7–7.7)
Neutrophils Relative %: 53 %
Platelets: 70 10*3/uL — ABNORMAL LOW (ref 150–400)
RBC: 3.8 MIL/uL — ABNORMAL LOW (ref 3.87–5.11)
RDW: 12.7 % (ref 11.5–15.5)
WBC: 5.2 10*3/uL (ref 4.0–10.5)
nRBC: 0 % (ref 0.0–0.2)

## 2019-04-20 LAB — LACTATE DEHYDROGENASE: LDH: 190 U/L (ref 98–192)

## 2019-04-21 ENCOUNTER — Inpatient Hospital Stay (HOSPITAL_BASED_OUTPATIENT_CLINIC_OR_DEPARTMENT_OTHER): Payer: PPO | Admitting: Nurse Practitioner

## 2019-04-21 DIAGNOSIS — R911 Solitary pulmonary nodule: Secondary | ICD-10-CM

## 2019-04-21 DIAGNOSIS — D693 Immune thrombocytopenic purpura: Secondary | ICD-10-CM | POA: Diagnosis not present

## 2019-04-21 NOTE — Assessment & Plan Note (Addendum)
1. Chronic ITP with exacerbations: - Patient has thrombocytopenia since July 2016. -She had a CT abdomen on 02/02/2018 showed normal-sized spleen. -She had a bone marrow biopsy and aspiration on 09/15/2017 showed hypercellular marrow for age, trilineage hematopoiesis with abundant megakaryocytes with no dysplasia. - She was given a prednisone tapering dose over 5 weeks on 12/14/2017 with no improvement in platelet count. -She was given IVIG (1 g/kg) on 12/23/2017 and 12/24/2017 with a peak and platelet count of 92, the response only lasted 3 weeks. - She was given dexamethasone 40 mg for 4 days on 01/11/2018 with improvement of platelet count of 61. -She denies any bleeding problems.  However she does have easy bruising. -Labs on 04/21/2019 showed her platelet count today is 70. - She reportedly had a pacemaker placed a few months ago due to bradycardia. -We will continue to monitor her platelets every 3 months.  If they get below 30 we will consider alternate therapy. - We will see her back in 3 months with repeat blood work.  2.  Right middle lobe lung nodule: -CT of the chest on 08/08/2018 showed right middle lobe lung nodule with one-year follow-up recommended. - She will have a follow-up CT scan in September 2020

## 2019-04-21 NOTE — Patient Instructions (Signed)
[facility] Discharge Instructions  Follow up in 3 months with labs    Thank you for choosing  Cancer Center at [facility] to provide your oncology and hematology care.  To afford each patient quality time with our provider, please arrive at least 15 minutes before your scheduled appointment time.   If you have a lab appointment with the Cancer Center please come in thru the  Main Entrance and check in at the main information desk  You need to re-schedule your appointment should you arrive 10 or more minutes late.  We strive to give you quality time with our providers, and arriving late affects you and other patients whose appointments are after yours.  Also, if you no show three or more times for appointments you may be dismissed from the clinic at the providers discretion.     Again, thank you for choosing [facility].  Our hope is that these requests will decrease the amount of time that you wait before being seen by our physicians.       _____________________________________________________________  Should you have questions after your visit to [facility], please contact our office at [phone] between the hours of 8:00 a.m. and 4:30 p.m.  Voicemails left after 4:00 p.m. will not be returned until the following business day.  For prescription refill requests, have your pharmacy contact our office and allow 72 hours.    Cancer Center Support Programs:   > Cancer Support Group  2nd Tuesday of the month 1pm-2pm, Journey Room    

## 2019-04-21 NOTE — Progress Notes (Signed)
[place] [place], [place] [address]   CLINIC:  Medical Oncology/Hematology  PCP:  Madeline Crouch, MD [place] [address] [phone]   REASON FOR VISIT: Follow-up for chronic ITP  CURRENT THERAPY: Observation  INTERVAL HISTORY:  Madeline [name] 85 y.o. female returns for routine follow-up for chronic ITP.  She reports she has been feeling fine since her last visit.  She denies any unusual bleeding.  She denies any extreme bruising. Denies any nausea, vomiting, or diarrhea. Denies any new pains. Had not noticed any recent bleeding such as epistaxis, hematuria or hematochezia. Denies recent chest pain on exertion, shortness of breath on minimal exertion, pre-syncopal episodes, or palpitations. Denies any numbness or tingling in hands or feet. Denies any recent fevers, infections, or recent hospitalizations. Patient reports appetite at 75% and energy level at 50%.  She is eating well and maintaining her weight at this time.   REVIEW OF SYSTEMS:  Review of Systems  All other systems reviewed and are negative.    PAST MEDICAL/SURGICAL HISTORY:  Past Medical History:  Diagnosis Date  . Bradycardia   . Coronary artery disease   . Diabetes mellitus   . Dyslipidemia   . GERD (gastroesophageal reflux disease)   . Hypothyroid   . Presence of permanent cardiac pacemaker 12/16/2018   Medtronic dual lead pacemaker   Past Surgical History:  Procedure Laterality Date  . APPENDECTOMY    . BREAST BIOPSY Right 1967   EXCISIONAL - NEG  . CHOLECYSTECTOMY    . CORONARY STENT INTERVENTION    . INSERT / REPLACE / REMOVE PACEMAKER  12/16/2018  . no surgical hx    . PACEMAKER IMPLANT N/A 12/16/2018   Procedure: PACEMAKER IMPLANT;  Surgeon: Evans Lance, MD;  Location: [facility];  Service: Cardiovascular;  Laterality: N/A;     SOCIAL HISTORY:  Social History   Socioeconomic History  . Marital status:  Married    Spouse name: Not on file  . Number of children: Not on file  . Years of education: Not on file  . Highest education level: Not on file  Occupational History  . Not on file  Social Needs  . Financial resource strain: Not on file  . Food insecurity:    Worry: Not on file    Inability: Not on file  . Transportation needs:    Medical: Not on file    Non-medical: Not on file  Tobacco Use  . Smoking status: Current Every Day Smoker    Packs/day: 1.00    Years: 50.00    Pack years: 50.00    Types: Cigarettes  . Smokeless tobacco: Never Used  Substance and Sexual Activity  . Alcohol use: No  . Drug use: No  . Sexual activity: Not on file  Lifestyle  . Physical activity:    Days per week: Not on file    Minutes per session: Not on file  . Stress: Not on file  Relationships  . Social connections:    Talks on phone: Not on file    Gets together: Not on file    Attends religious service: Not on file    Active member of club or organization: Not on file    Attends meetings of clubs or organizations: Not on file    Relationship status: Not on file  . Intimate partner violence:    Fear of current or ex partner: Not on file  Emotionally abused: Not on file    Physically abused: Not on file    Forced sexual activity: Not on file  Other Topics Concern  . Not on file  Social History Narrative  . Not on file    FAMILY HISTORY:  Family History  Problem Relation Age of Onset  . Heart Problems Mother   . Other Father        UNK  . Cancer Sister        BREAST  . Breast cancer Sister 44  . Cancer Sister        BREAST  . Breast cancer Sister 75  . Cancer Sister        BREAST  . Diabetes Son     CURRENT MEDICATIONS:  Outpatient Encounter Medications as of 04/21/2019  Medication Sig  . acetaminophen (TYLENOL) 325 MG tablet Take 1-2 tablets (325-650 mg total) by mouth every 6 (six) hours as needed for mild pain or moderate pain.  Marland Kitchen atorvastatin (LIPITOR) 20 MG  tablet Take 1 tablet (20 mg total) by mouth daily at 6 PM. Please make annual appt with Dr. Harrington Challenger for future refills. Thank you  . cetirizine (ZYRTEC) 10 MG tablet Take 10 mg by mouth daily.  . citalopram (CELEXA) 20 MG tablet Take 20 mg by mouth daily.    . CONTOUR NEXT TEST test strip USE 1 STRIP TO CHECK BLOOD SUGAR EIGHT TO NINE TIMES DAILY.  Marland Kitchen Insulin Human (INSULIN PUMP) SOLN Inject 1 each into the skin continuous. Used with Novolog insulin  . levothyroxine (SYNTHROID, LEVOTHROID) 50 MCG tablet Take 50 mcg by mouth daily before breakfast.   . lisinopril (PRINIVIL,ZESTRIL) 20 MG tablet Take 1 tablet (20 mg total) by mouth daily.  . Multiple Vitamin (MULTIVITAMIN WITH MINERALS) TABS tablet Take 1 tablet by mouth daily.  Marland Kitchen NOVOLOG 100 UNIT/ML injection Inject 0-50 Units into the skin continuous. Up to 50 units/ day via pump   No facility-administered encounter medications on file as of 04/21/2019.     ALLERGIES:  Allergies  Allergen Reactions  . Darvon     "FEEL SICK"  . Propoxyphene Nausea Only and Nausea And Vomiting  . Sulfa Antibiotics Nausea Only and Nausea And Vomiting    "FEEL SICK"      PHYSICAL EXAM:  ECOG Performance status: 1  Vitals:   04/21/19 1110  BP: (!) 146/48  Pulse: 66  Resp: 18  Temp: 99 F (37.2 C)  SpO2: 98%   Filed Weights   04/21/19 1110  Weight: 112 lb 14.4 oz (51.2 kg)    Physical Exam Constitutional:      Appearance: Normal appearance. She is normal weight.  Cardiovascular:     Rate and Rhythm: Normal rate and regular rhythm.     Heart sounds: Normal heart sounds.  Pulmonary:     Effort: Pulmonary effort is normal.     Breath sounds: Normal breath sounds.  Abdominal:     General: Bowel sounds are normal.     Palpations: Abdomen is soft.  Musculoskeletal: Normal range of motion.  Skin:    General: Skin is warm and dry.  Neurological:     Mental Status: She is alert and oriented to person, place, and time. Mental status is at baseline.   Psychiatric:        Mood and Affect: Mood normal.        Behavior: Behavior normal.        Thought Content: Thought content normal.  Judgment: Judgment normal.      LABORATORY DATA:  I have reviewed the labs as listed.  CBC    Component Value Date/Time   WBC 5.2 04/20/2019 1018   RBC 3.80 (L) 04/20/2019 1018   HGB 11.4 (L) 04/20/2019 1018   HGB 11.3 12/09/2018 1308   HCT 36.0 04/20/2019 1018   HCT 33.1 (L) 12/09/2018 1308   PLT 70 (L) 04/20/2019 1018   PLT 52 (LL) 12/09/2018 1308   MCV 94.7 04/20/2019 1018   MCV 92 12/09/2018 1308   MCH 30.0 04/20/2019 1018   MCHC 31.7 04/20/2019 1018   RDW 12.7 04/20/2019 1018   RDW 12.2 12/09/2018 1308   LYMPHSABS 1.9 04/20/2019 1018   LYMPHSABS 2.0 12/09/2018 1308   MONOABS 0.5 04/20/2019 1018   EOSABS 0.1 04/20/2019 1018   EOSABS 0.2 12/09/2018 1308   BASOSABS 0.0 04/20/2019 1018   BASOSABS 0.0 12/09/2018 1308   CMP Latest Ref Rng & Units 01/17/2019 12/16/2018 12/09/2018  Glucose 70 - 99 mg/dL 208(H) 441(H) 206(H)  BUN 8 - 23 mg/dL 9 - 8  Creatinine 0.44 - 1.00 mg/dL 0.74 - 0.86  Sodium 135 - 145 mmol/L 138 - 143  Potassium 3.5 - 5.1 mmol/L 4.6 - 4.9  Chloride 98 - 111 mmol/L 103 - 105  CO2 22 - 32 mmol/L 29 - 25  Calcium 8.9 - 10.3 mg/dL 8.9 - 9.0  Total Protein 6.5 - 8.1 g/dL 6.3(L) - -  Total Bilirubin 0.3 - 1.2 mg/dL 0.5 - -  Alkaline Phos 38 - 126 U/L 66 - -  AST 15 - 41 U/L 30 - -  ALT 0 - 44 U/L 24 - -    I personally performed a face-to-face visit.  All questions were answered to patient's stated satisfaction. Encouraged patient to call with any new concerns or questions before his next visit to the cancer center and we can certain see him sooner, if needed.     ASSESSMENT & PLAN:   Chronic ITP (idiopathic thrombocytopenia) (HCC) 1. Chronic ITP with exacerbations: - Patient has thrombocytopenia since July 2016. -She had a CT abdomen on 02/02/2018 showed normal-sized spleen. -She had a bone marrow biopsy and  aspiration on 09/15/2017 showed hypercellular marrow for age, trilineage hematopoiesis with abundant megakaryocytes with no dysplasia. - She was given a prednisone tapering dose over 5 weeks on 12/14/2017 with no improvement in platelet count. -She was given IVIG (1 g/kg) on 12/23/2017 and 12/24/2017 with a peak and platelet count of 92, the response only lasted 3 weeks. - She was given dexamethasone 40 mg for 4 days on 01/11/2018 with improvement of platelet count of 61. -She denies any bleeding problems.  However she does have easy bruising. -Labs on 04/21/2019 showed her platelet count today is 70. - She reportedly had a pacemaker placed a few months ago due to bradycardia. -We will continue to monitor her platelets every 3 months.  If they get below 30 we will consider alternate therapy. - We will see her back in 3 months with repeat blood work.  2.  Right middle lobe lung nodule: -CT of the chest on 08/08/2018 showed right middle lobe lung nodule with one-year follow-up recommended. - She will have a follow-up CT scan in September 2020      Orders placed this encounter:  Orders Placed This Encounter  Procedures  . Lactate dehydrogenase  . CBC with Differential/Platelet  . Comprehensive metabolic panel     Madeline Finders, FNP-C [facility] [phone]

## 2019-04-23 IMAGING — CT CT CHEST W/O CM
2 of 4 series · 15 of 36 positions shown, 18 images · non-contrast
Comparison: 02/02/2018 chest CT.

CLINICAL DATA: 84-year-old female. Follow-up lung nodule. No chest
pain. Some shortness breath.

EXAM:
CT CHEST WITHOUT CONTRAST
TECHNIQUE: Multidetector CT imaging of the chest was performed following the
standard protocol without IV contrast.

[Series 2: chest · axial · 0.53mm/px · z∈[-1251,-983]mm · 12 of 160 slices shown, 15 images (1 of 2)]
[im 13/160  mediastinal]
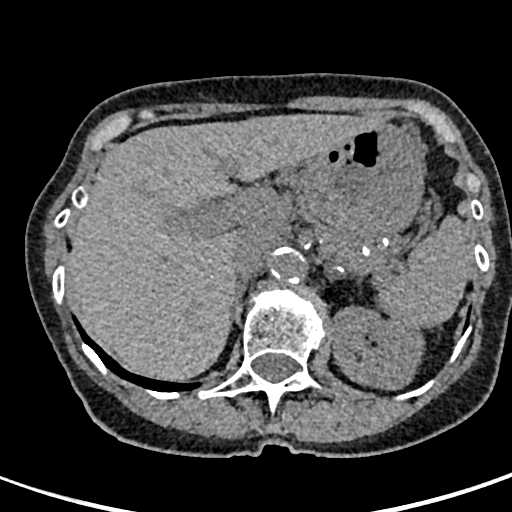
[im 13/160  lung]
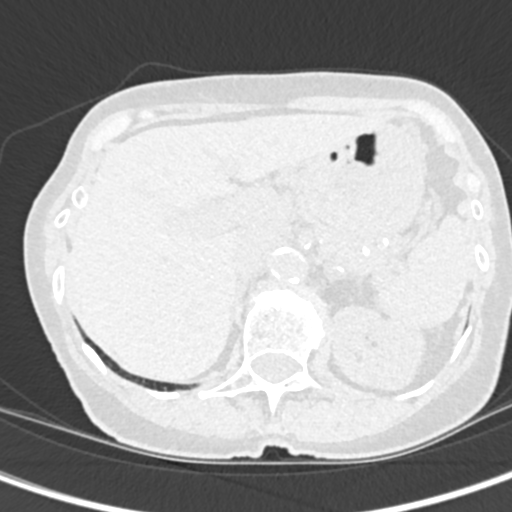
[im 25/160  lung]
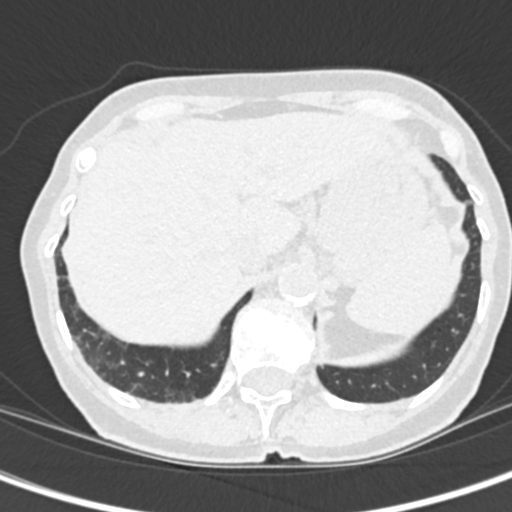
[im 37/160  lung]
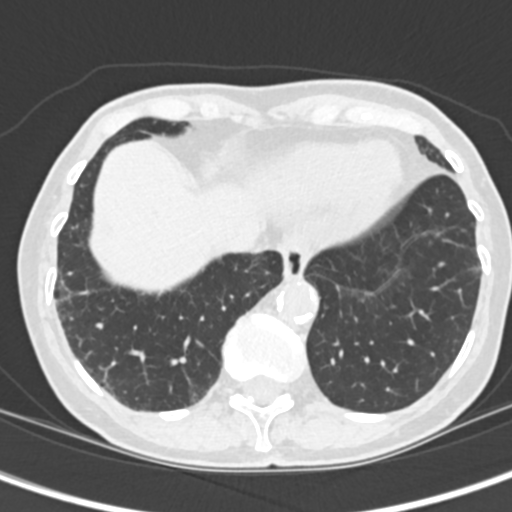
[im 49/160  lung]
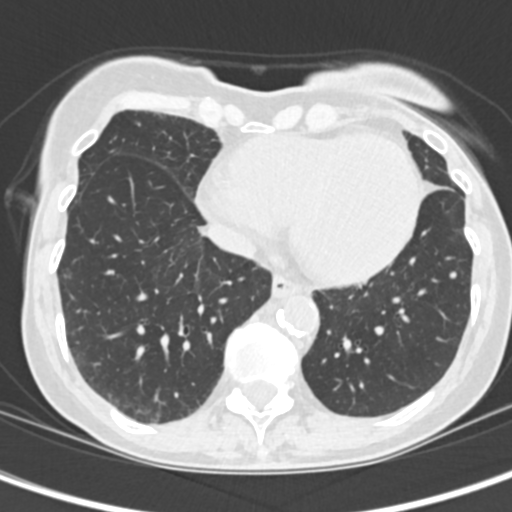
[im 62/160  mediastinal]
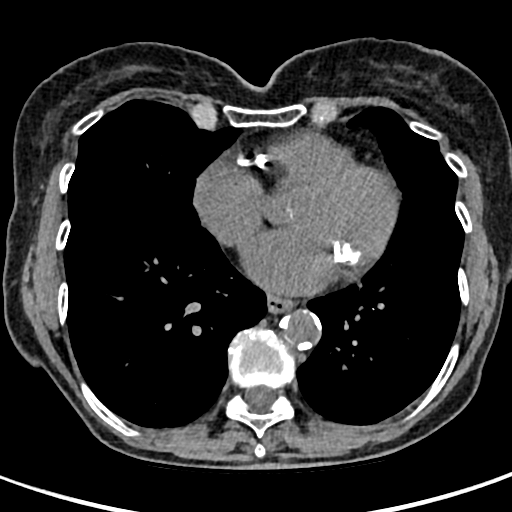
[im 62/160  lung]
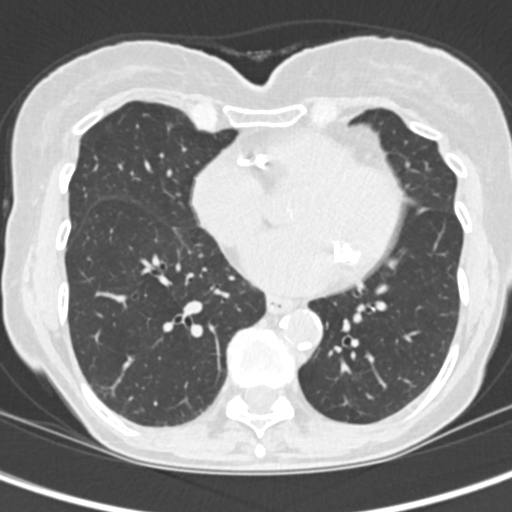
[im 74/160  lung]
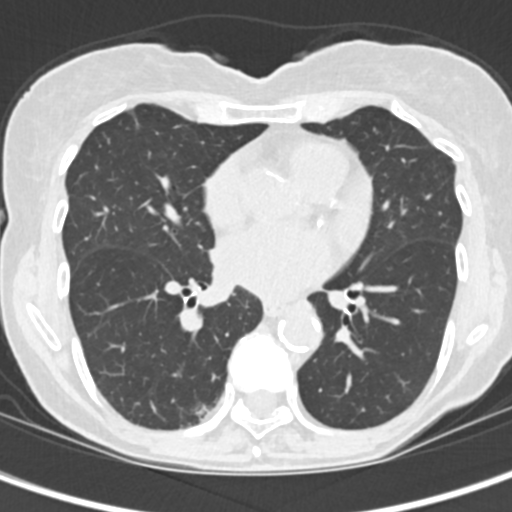
[im 86/160  lung]
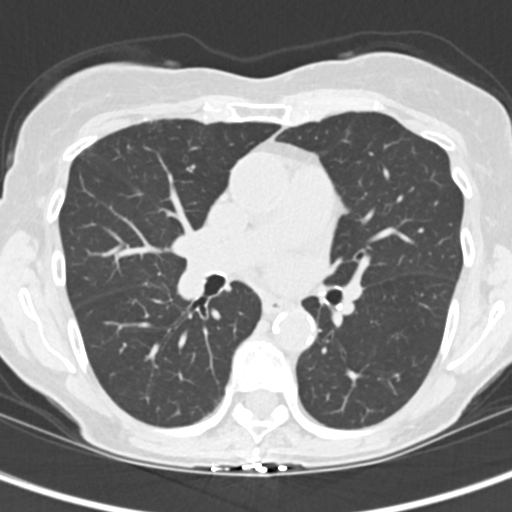
[im 98/160  lung]
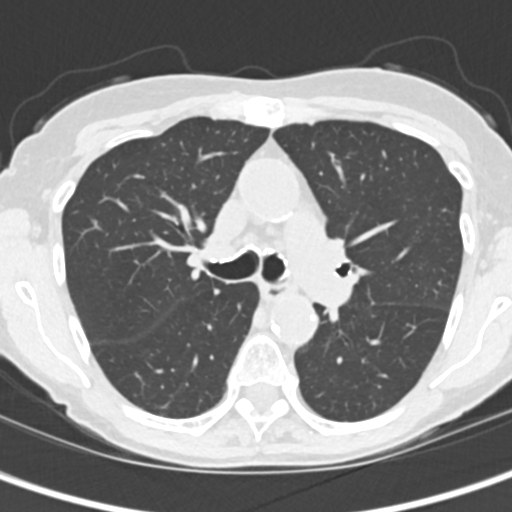
[im 111/160  mediastinal]
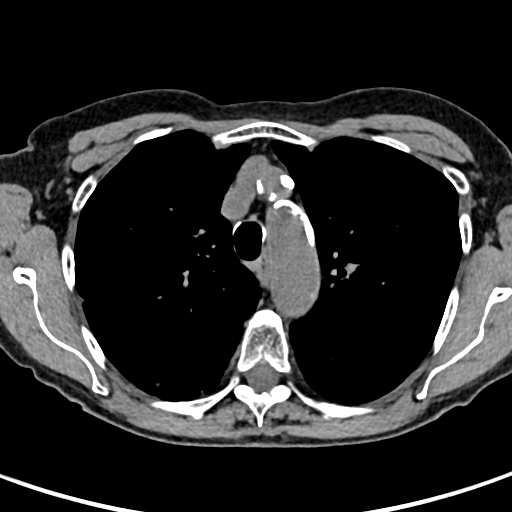
[im 111/160  lung]
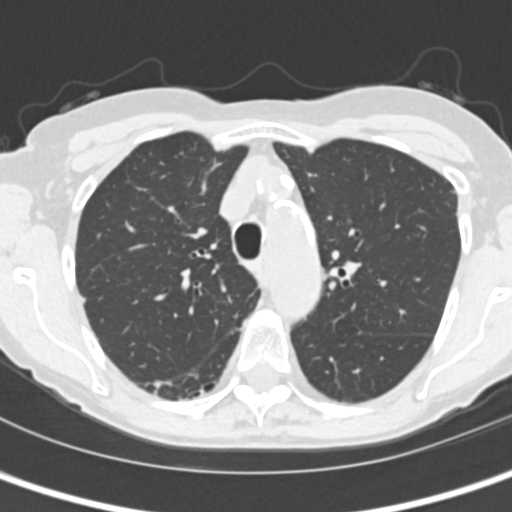
[im 123/160  lung]
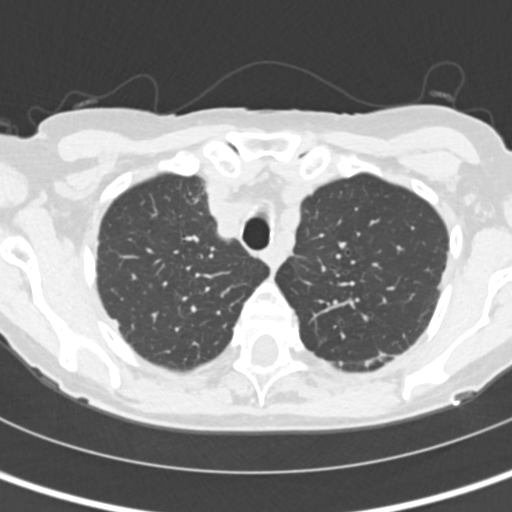
[im 135/160  lung]
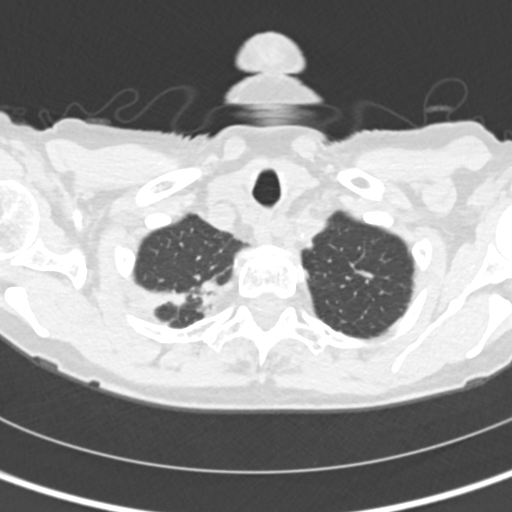
[im 147/160  lung]
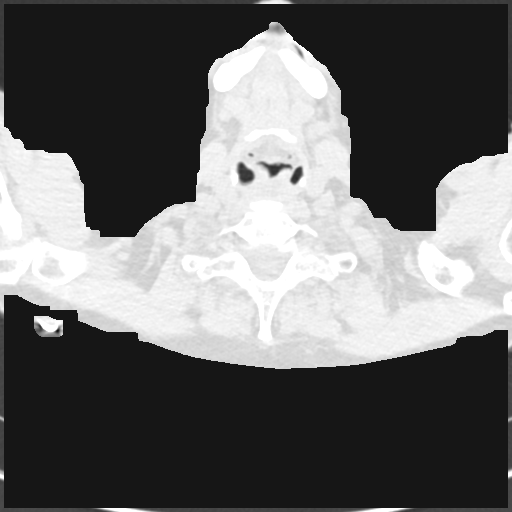

[Series 5: chest · coronal · 0.53mm/px · 3 of 134 slices shown (2 of 2)]
[im 27/134  lung]
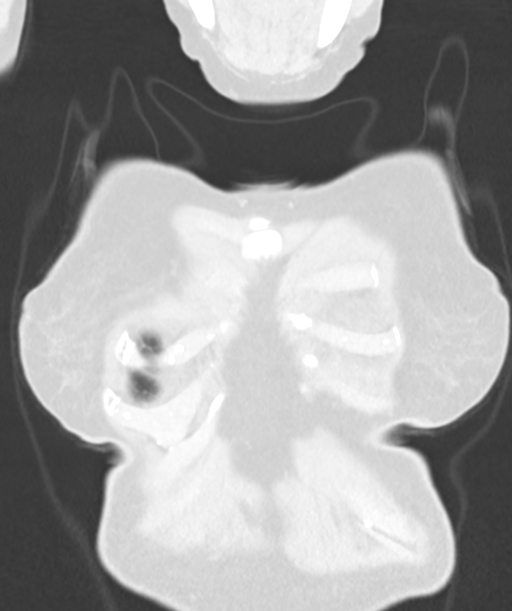
[im 54/134  lung]
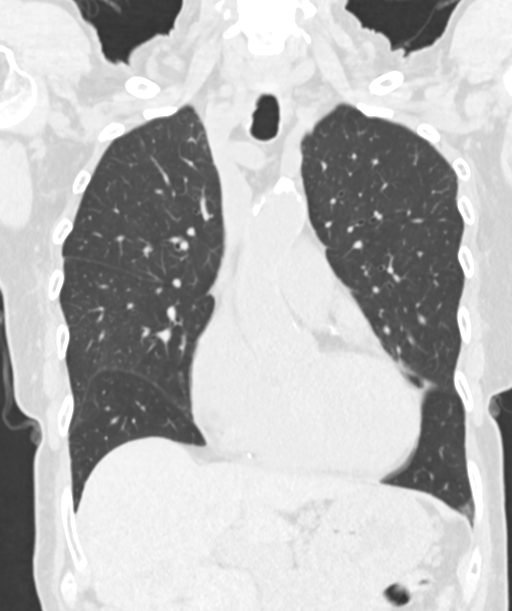
[im 80/134  lung]
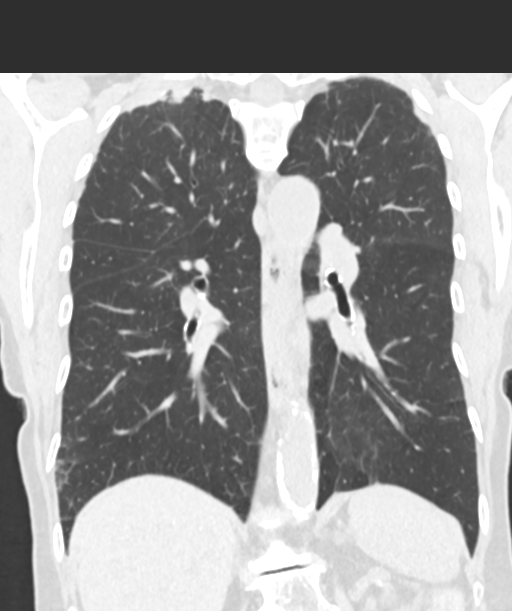

[15 of 36 positions shown; findings below may reference images not displayed]

FINDINGS: Cardiovascular: Top-normal heart size. Coronary artery
calcifications. Mitral and aortic valve calcifications.
Atherosclerotic changes throughout the thoracic and upper abdominal
aorta. Atherosclerotic changes great vessels.

Mediastinum/Nodes: No mediastinal or hilar adenopathy. No thyroid
lesion identified. No esophageal abnormality.

Lungs/Pleura: Stable right middle lobe angular 6 mm nodule (series
3, image 84, series 5, image 32 and series 7, image 36). Stable tiny
left lower lobe 3.7 mm nodule (series 3, image 112, series 5, image
73 and series 7, image 120). Tiny small nodules left lower lobe also
stable (series 7, image 121). Stable fissural lymph nodes. Stable
biapical parenchymal changes which probably represents scarring.
Stable chronic lung changes. No new worrisome mass identified.
Trachea and mainstem bronchi are patent.

Upper Abdomen: Post cholecystectomy.  No worrisome abnormality.

Musculoskeletal: Degenerative changes lower cervical spine. Mild
degenerative changes portions of the thoracic spine. Partial fusion
upper thoracic spine. No thoracic compression deformity or osseous
destructive lesion. Mild degenerative changes upper lumbar spine.

Other: Slightly dense breast parenchyma with coarse calcifications.
No axillary adenopathy.
IMPRESSION: 1. Overall no significant change. Stable right middle lobe angular 6
mm nodule (series 3, image 84, series 5, image 32 and series 7,
image 36). Stable tiny left lower lobe 3.7 mm nodule (series 3,
image 112, series 5, image 73 and series 7, image 120). Tiny small
nodules left lower lobe also stable (series 7, image 121). Stable
fissural lymph nodes. Stable biapical parenchymal changes which
probably represents scarring. Follow-up unenhanced chest CT in 12-18
months recommended.
2.  Aortic Atherosclerosis (QTVET-GPG.G).
3. Coronary artery calcifications.

## 2019-04-25 ENCOUNTER — Telehealth: Payer: Self-pay

## 2019-04-25 NOTE — Telephone Encounter (Signed)
Left message regarding appt on 04/26/19. 

## 2019-04-26 ENCOUNTER — Encounter: Payer: PPO | Admitting: Nurse Practitioner

## 2019-05-09 ENCOUNTER — Encounter: Payer: PPO | Admitting: Internal Medicine

## 2019-06-01 ENCOUNTER — Other Ambulatory Visit: Payer: Self-pay

## 2019-06-01 NOTE — Patient Outreach (Signed)
[facility]) Care Management Chronic Special Needs Program  06/01/2019  Name: MURREL FREET DOB: 1934/06/16  MRN: [id number]  Ms. Fran Neiswonger is enrolled in a chronic special needs plan for Diabetes. Chronic Care Management Coordinator telephoned client to review health risk assessment and to develop individualized care plan.  Introduced the chronic care management program, importance of client participation, and taking their care plan to all provider appointments and inpatient facilities.  Reviewed the transition of care process and possible referral to community care management.  Subjective:Client states that her Hemoglobin A1C has improved since she started giving herself insulin when she eats snacks.  States that her sugars range 130-150 in the morning and 200's later in the day. Denies any rcent hypoglycemia. States she feels good about counting her CHO.  States she has a big garden and she eats lots of fresh vegetables like green beans and squash.  States she had a pacemaker put in earlier this year.  States she had a stent put in her heart about 5 yrs ago. Denies having heart failure  Denies any SOB or chest pains.  States her B/P is under control with her medications  Goals Addressed            This Visit's Progress   . Client understands the importance of follow-up with providers by attending scheduled visits      . COMPLETED: Client will report no worsening of symptoms related to heart disease within the next 6 months      . Client will use Assistive Devices as needed and verbalize understanding of device use       [employer] approved meters-One Touch, Freestyle or Precision     . Client will verbalize knowledge of self management of Hypertension as evidences by BP reading of 140/90 or less; or as defined by provider      . Decrease inpatient admissions/ readmissions with in the next year      . HEMOGLOBIN A1C < 7.0      . Maintain timely refills of  diabetic medication as prescribed within the year .      Marland Kitchen Obtain annual  Lipid Profile, LDL-C      . Obtain Annual Eye (retinal)  Exam       . Obtain Annual Foot Exam      . Obtain annual screen for micro albuminuria (urine) , nephropathy (kidney problems)      . Obtain Hemoglobin A1C at least 2 times per year      . Visit Primary Care Provider or Endocrinologist at least 2 times per year        Client is not meeting diabetes self management goal of hemoglobin A1C of <7% with last reading of 7.9%.  Client is followed by her endocrinologist regularly. Client had marked heart failure on her health risk assessment but no HF in hx.  Hx of CAD and pacemaker insertion.  Reviewed CHO counting and portion control.  Reinforced importance of taking her bolus insulin when she eats foods with CHO Instructed on importance of regular exercise to glycemic control Reviewed s/s of hypoglycemia and actions to take Reviewed number for 24 hour [place] cause, symptoms, precautions (social distancing, stay at home order, hand washing), confirmed client knows how to contact provider.  Plan:  Send successful outreach letter with a copy of their individualized care plan, Send individual care plan to provider and Send educational material-heart disease, HTN, CHO counting sheet.  Chronic  care management coordination will outreach in:  3-4 Months    Dudley MajorMelissa Deloyd Handy RN, [facility] LLCBSN,CCM, CDE Chronic Care Management Coordinator [facility] Care Management [phone]

## 2019-06-05 DIAGNOSIS — E1043 Type 1 diabetes mellitus with diabetic autonomic (poly)neuropathy: Secondary | ICD-10-CM | POA: Diagnosis not present

## 2019-06-16 ENCOUNTER — Ambulatory Visit (INDEPENDENT_AMBULATORY_CARE_PROVIDER_SITE_OTHER): Payer: HMO | Admitting: *Deleted

## 2019-06-16 ENCOUNTER — Telehealth: Payer: Self-pay

## 2019-06-16 DIAGNOSIS — R001 Bradycardia, unspecified: Secondary | ICD-10-CM

## 2019-06-16 LAB — CUP PACEART REMOTE DEVICE CHECK
Battery Remaining Longevity: 36 mo
Battery Voltage: 2.96 V
Brady Statistic AP VP Percent: 0.27 %
Brady Statistic AP VS Percent: 0 %
Brady Statistic AS VP Percent: 99.71 %
Brady Statistic AS VS Percent: 0.01 %
Brady Statistic RA Percent Paced: 0.27 %
Brady Statistic RV Percent Paced: 99.99 %
Date Time Interrogation Session: 20200731142559
Implantable Lead Implant Date: 20200131
Implantable Lead Implant Date: 20200131
Implantable Lead Location: 753859
Implantable Lead Location: 753860
Implantable Lead Model: 3830
Implantable Lead Model: 5076
Implantable Pulse Generator Implant Date: 20200131
Lead Channel Impedance Value: 323 Ohm
Lead Channel Impedance Value: 361 Ohm
Lead Channel Impedance Value: 437 Ohm
Lead Channel Impedance Value: 437 Ohm
Lead Channel Pacing Threshold Amplitude: 0.375 V
Lead Channel Pacing Threshold Amplitude: 0.875 V
Lead Channel Pacing Threshold Pulse Width: 0.4 ms
Lead Channel Pacing Threshold Pulse Width: 0.4 ms
Lead Channel Sensing Intrinsic Amplitude: 2.25 mV
Lead Channel Sensing Intrinsic Amplitude: 2.25 mV
Lead Channel Setting Pacing Amplitude: 1.5 V
Lead Channel Setting Pacing Amplitude: 5 V
Lead Channel Setting Pacing Pulse Width: 1 ms
Lead Channel Setting Sensing Sensitivity: 4 mV

## 2019-06-16 NOTE — Telephone Encounter (Signed)
Spoke with patient to remind of missed remote transmission 

## 2019-06-22 ENCOUNTER — Encounter: Payer: Self-pay | Admitting: Cardiology

## 2019-06-22 NOTE — Progress Notes (Signed)
Remote pacemaker transmission.   

## 2019-06-26 ENCOUNTER — Other Ambulatory Visit: Payer: Self-pay | Admitting: Internal Medicine

## 2019-06-29 DIAGNOSIS — E1065 Type 1 diabetes mellitus with hyperglycemia: Secondary | ICD-10-CM | POA: Diagnosis not present

## 2019-06-30 ENCOUNTER — Other Ambulatory Visit: Payer: Self-pay | Admitting: Internal Medicine

## 2019-07-03 DIAGNOSIS — E039 Hypothyroidism, unspecified: Secondary | ICD-10-CM | POA: Diagnosis not present

## 2019-07-03 DIAGNOSIS — E7800 Pure hypercholesterolemia, unspecified: Secondary | ICD-10-CM | POA: Diagnosis not present

## 2019-07-03 DIAGNOSIS — J431 Panlobular emphysema: Secondary | ICD-10-CM | POA: Diagnosis not present

## 2019-07-03 DIAGNOSIS — I10 Essential (primary) hypertension: Secondary | ICD-10-CM | POA: Diagnosis not present

## 2019-07-03 DIAGNOSIS — Z79899 Other long term (current) drug therapy: Secondary | ICD-10-CM | POA: Diagnosis not present

## 2019-07-03 DIAGNOSIS — D696 Thrombocytopenia, unspecified: Secondary | ICD-10-CM | POA: Diagnosis not present

## 2019-07-03 DIAGNOSIS — E1042 Type 1 diabetes mellitus with diabetic polyneuropathy: Secondary | ICD-10-CM | POA: Diagnosis not present

## 2019-07-04 DIAGNOSIS — Z79899 Other long term (current) drug therapy: Secondary | ICD-10-CM | POA: Diagnosis not present

## 2019-07-04 DIAGNOSIS — E1043 Type 1 diabetes mellitus with diabetic autonomic (poly)neuropathy: Secondary | ICD-10-CM | POA: Diagnosis not present

## 2019-07-04 DIAGNOSIS — E7800 Pure hypercholesterolemia, unspecified: Secondary | ICD-10-CM | POA: Diagnosis not present

## 2019-07-04 DIAGNOSIS — E039 Hypothyroidism, unspecified: Secondary | ICD-10-CM | POA: Diagnosis not present

## 2019-07-11 DIAGNOSIS — I10 Essential (primary) hypertension: Secondary | ICD-10-CM | POA: Diagnosis not present

## 2019-07-11 DIAGNOSIS — E1043 Type 1 diabetes mellitus with diabetic autonomic (poly)neuropathy: Secondary | ICD-10-CM | POA: Diagnosis not present

## 2019-07-11 DIAGNOSIS — E063 Autoimmune thyroiditis: Secondary | ICD-10-CM | POA: Diagnosis not present

## 2019-07-11 DIAGNOSIS — F17200 Nicotine dependence, unspecified, uncomplicated: Secondary | ICD-10-CM | POA: Diagnosis not present

## 2019-07-11 DIAGNOSIS — E038 Other specified hypothyroidism: Secondary | ICD-10-CM | POA: Diagnosis not present

## 2019-07-11 DIAGNOSIS — Z9641 Presence of insulin pump (external) (internal): Secondary | ICD-10-CM | POA: Diagnosis not present

## 2019-07-18 ENCOUNTER — Other Ambulatory Visit (HOSPITAL_COMMUNITY): Payer: PPO

## 2019-07-25 ENCOUNTER — Encounter (HOSPITAL_COMMUNITY): Payer: Self-pay | Admitting: Nurse Practitioner

## 2019-07-25 ENCOUNTER — Inpatient Hospital Stay (HOSPITAL_COMMUNITY): Payer: HMO | Attending: Hematology | Admitting: Nurse Practitioner

## 2019-07-25 ENCOUNTER — Inpatient Hospital Stay (HOSPITAL_COMMUNITY): Payer: HMO

## 2019-07-25 ENCOUNTER — Other Ambulatory Visit: Payer: Self-pay

## 2019-07-25 DIAGNOSIS — D693 Immune thrombocytopenic purpura: Secondary | ICD-10-CM | POA: Insufficient documentation

## 2019-07-25 DIAGNOSIS — R911 Solitary pulmonary nodule: Secondary | ICD-10-CM | POA: Diagnosis not present

## 2019-07-25 LAB — LACTATE DEHYDROGENASE: LDH: 172 U/L (ref 98–192)

## 2019-07-25 LAB — CBC WITH DIFFERENTIAL/PLATELET
Abs Immature Granulocytes: 0.01 10*3/uL (ref 0.00–0.07)
Basophils Absolute: 0 10*3/uL (ref 0.0–0.1)
Basophils Relative: 1 %
Eosinophils Absolute: 0.2 10*3/uL (ref 0.0–0.5)
Eosinophils Relative: 4 %
HCT: 37.7 % (ref 36.0–46.0)
Hemoglobin: 11.9 g/dL — ABNORMAL LOW (ref 12.0–15.0)
Immature Granulocytes: 0 %
Lymphocytes Relative: 42 %
Lymphs Abs: 2 10*3/uL (ref 0.7–4.0)
MCH: 30.4 pg (ref 26.0–34.0)
MCHC: 31.6 g/dL (ref 30.0–36.0)
MCV: 96.2 fL (ref 80.0–100.0)
Monocytes Absolute: 0.4 10*3/uL (ref 0.1–1.0)
Monocytes Relative: 8 %
Neutro Abs: 2.1 10*3/uL (ref 1.7–7.7)
Neutrophils Relative %: 45 %
Platelets: 55 10*3/uL — ABNORMAL LOW (ref 150–400)
RBC: 3.92 MIL/uL (ref 3.87–5.11)
RDW: 12.6 % (ref 11.5–15.5)
WBC: 4.7 10*3/uL (ref 4.0–10.5)
nRBC: 0 % (ref 0.0–0.2)

## 2019-07-25 LAB — COMPREHENSIVE METABOLIC PANEL
ALT: 23 U/L (ref 0–44)
AST: 30 U/L (ref 15–41)
Albumin: 3.7 g/dL (ref 3.5–5.0)
Alkaline Phosphatase: 73 U/L (ref 38–126)
Anion gap: 8 (ref 5–15)
BUN: 11 mg/dL (ref 8–23)
CO2: 27 mmol/L (ref 22–32)
Calcium: 8.6 mg/dL — ABNORMAL LOW (ref 8.9–10.3)
Chloride: 102 mmol/L (ref 98–111)
Creatinine, Ser: 0.73 mg/dL (ref 0.44–1.00)
GFR calc Af Amer: >60 mL/min (ref >60)
GFR calc non Af Amer: >60 mL/min (ref >60)
Glucose, Bld: 243 mg/dL — ABNORMAL HIGH (ref 70–99)
Potassium: 4 mmol/L (ref 3.5–5.1)
Sodium: 137 mmol/L (ref 135–145)
Total Bilirubin: 0.7 mg/dL (ref 0.3–1.2)
Total Protein: 6.3 g/dL — ABNORMAL LOW (ref 6.5–8.1)

## 2019-07-25 NOTE — Assessment & Plan Note (Addendum)
1. Chronic ITP with exacerbations: - Patient has thrombocytopenia since July 2016. -She had a CT abdomen on 02/02/2018 showed normal-sized spleen. -She had a bone marrow biopsy and aspiration on 09/15/2017 showed hypercellular marrow for age, trilineage hematopoiesis with abundant megakaryocytes with no dysplasia. - She was given a prednisone tapering dose over 5 weeks on 12/14/2017 with no improvement in platelet count. -She was given IVIG (1 g/kg) on 12/23/2017 and 12/24/2017 with a peak and platelet count of 92, the response only lasted 3 weeks. - She was given dexamethasone 40 mg for 4 days on 01/11/2018 with improvement of platelet count of 61. -She denies any bleeding problems.  However she does have easy bruising. -She had a pacemaker placed back in January or February due to her bradycardia.  They are monitoring her every month. -Labs on 07/25/2019 showed her platelet count today is 55. -We will continue to monitor her platelets every 3 months.  If they get below 30 we will consider alternate therapy. - We will see her back in 3 months with repeat blood work.  2.  Right middle lobe lung nodule: -CT of the chest on 08/08/2018 showed right middle lobe lung nodule with one-year follow-up recommended. - She will have a follow-up CT scan in September 2020

## 2019-07-25 NOTE — Progress Notes (Signed)
[place], [place] [address]   CLINIC:  Medical Oncology/Hematology  PCP:  Idelle Crouch, MD [place] [address] [phone]   REASON FOR VISIT: Follow-up for thrombocytopenia  CURRENT THERAPY: Observation   INTERVAL HISTORY:  Madeline [name] 85 y.o. female returns for routine follow-up for thrombocytopenia.  Patient reports she has been doing well since her last visit.  She does have easy bruising.  She denies any bright red bleeding per rectum or melena.  She denies any nosebleeds.  She denies any petechiae. Denies any nausea, vomiting, or diarrhea. Denies any new pains. Had not noticed any recent bleeding such as epistaxis, hematuria or hematochezia. Denies recent chest pain on exertion, shortness of breath on minimal exertion, pre-syncopal episodes, or palpitations. Denies any numbness or tingling in hands or feet. Denies any recent fevers, infections, or recent hospitalizations. Patient reports appetite at 100% and energy level at 75%.  She is eating well maintaining her weight at this time.    REVIEW OF SYSTEMS:  Review of Systems  Constitutional: Positive for fatigue.  Cardiovascular: Positive for leg swelling.  Gastrointestinal: Positive for diarrhea.  All other systems reviewed and are negative.    PAST MEDICAL/SURGICAL HISTORY:  Past Medical History:  Diagnosis Date  . Bradycardia   . Coronary artery disease   . Diabetes mellitus   . Dyslipidemia   . GERD (gastroesophageal reflux disease)   . Hypothyroid   . Presence of permanent cardiac pacemaker 12/16/2018   Medtronic dual lead pacemaker   Past Surgical History:  Procedure Laterality Date  . APPENDECTOMY    . BREAST BIOPSY Right 1967   EXCISIONAL - NEG  . CHOLECYSTECTOMY    . CORONARY STENT INTERVENTION    . INSERT / REPLACE / REMOVE PACEMAKER  12/16/2018  . no surgical hx    . PACEMAKER IMPLANT N/A 12/16/2018   Procedure:  PACEMAKER IMPLANT;  Surgeon: Evans Lance, MD;  Location: [facility];  Service: Cardiovascular;  Laterality: N/A;     SOCIAL HISTORY:  Social History   Socioeconomic History  . Marital status: Married    Spouse name: Not on file  . Number of children: Not on file  . Years of education: Not on file  . Highest education level: Not on file  Occupational History  . Not on file  Social Needs  . Financial resource strain: Not on file  . Food insecurity    Worry: Never true    Inability: Never true  . Transportation needs    Medical: No    Non-medical: No  Tobacco Use  . Smoking status: Current Every Day Smoker    Packs/day: 1.00    Years: 50.00    Pack years: 50.00    Types: Cigarettes  . Smokeless tobacco: Never Used  Substance and Sexual Activity  . Alcohol use: No  . Drug use: No  . Sexual activity: Not on file  Lifestyle  . Physical activity    Days per week: Not on file    Minutes per session: Not on file  . Stress: Not on file  Relationships  . Social Herbalist on phone: Not on file    Gets together: Not on file    Attends religious service: Not on file    Active member of club or organization: Not on file    Attends meetings of clubs or organizations: Not on file  Relationship status: Not on file  . Intimate partner violence    Fear of current or ex partner: Not on file    Emotionally abused: Not on file    Physically abused: Not on file    Forced sexual activity: Not on file  Other Topics Concern  . Not on file  Social History Narrative  . Not on file    FAMILY HISTORY:  Family History  Problem Relation Age of Onset  . Heart Problems Mother   . Other Father        UNK  . Cancer Sister        BREAST  . Breast cancer Sister 75  . Cancer Sister        BREAST  . Breast cancer Sister 71  . Cancer Sister        BREAST  . Diabetes Son     CURRENT MEDICATIONS:  Outpatient Encounter Medications as of 07/25/2019  Medication  Sig  . atorvastatin (LIPITOR) 20 MG tablet TAKE 1 TABLET BY MOUTH ONCE DAILY AT 6 PM. PLEAE MAKE ANNUAL APPOINTMENT WITH DR. ROSS FOR FUTURE REFILLS. THANK YOU.  . cetirizine (ZYRTEC) 10 MG tablet Take 10 mg by mouth daily.  . citalopram (CELEXA) 20 MG tablet Take 20 mg by mouth daily.    . CONTOUR NEXT TEST test strip USE 1 STRIP TO CHECK BLOOD SUGAR EIGHT TO NINE TIMES DAILY.  Marland Kitchen Insulin Human (INSULIN PUMP) SOLN Inject 1 each into the skin continuous. Used with Novolog insulin  . Lancets 28G MISC Use as instructed. 7-8 times daily.  Marland Kitchen levothyroxine (SYNTHROID, LEVOTHROID) 50 MCG tablet Take 50 mcg by mouth daily before breakfast.   . lisinopril (PRINIVIL,ZESTRIL) 20 MG tablet Take 1 tablet (20 mg total) by mouth daily.  . Multiple Vitamin (MULTIVITAMIN WITH MINERALS) TABS tablet Take 1 tablet by mouth daily.  Marland Kitchen NOVOLOG 100 UNIT/ML injection Inject 0-50 Units into the skin continuous. Up to 50 units/ day via pump  . acetaminophen (TYLENOL) 325 MG tablet Take 1-2 tablets (325-650 mg total) by mouth every 6 (six) hours as needed for mild pain or moderate pain. (Patient not taking: Reported on 07/25/2019)   No facility-administered encounter medications on file as of 07/25/2019.     ALLERGIES:  Allergies  Allergen Reactions  . Darvon     "FEEL SICK"  . Propoxyphene Nausea Only and Nausea And Vomiting  . Sulfa Antibiotics Nausea Only and Nausea And Vomiting    "FEEL SICK"      PHYSICAL EXAM:  ECOG Performance status: 1  Vitals:   07/25/19 1147  BP: (!) 118/57  Pulse: 80  Resp: 20  Temp: (!) 97.3 F (36.3 C)  SpO2: 98%   Filed Weights   07/25/19 1147  Weight: 115 lb (52.2 kg)    Physical Exam Constitutional:      Appearance: Normal appearance. She is normal weight.  Cardiovascular:     Rate and Rhythm: Normal rate and regular rhythm.     Heart sounds: Normal heart sounds.  Pulmonary:     Effort: Pulmonary effort is normal.     Breath sounds: Normal breath sounds.   Abdominal:     General: Bowel sounds are normal.     Palpations: Abdomen is soft.  Musculoskeletal: Normal range of motion.  Skin:    General: Skin is warm and dry.  Neurological:     Mental Status: She is alert and oriented to person, place, and time. Mental status is at baseline.  Psychiatric:        Mood and Affect: Mood normal.        Behavior: Behavior normal.        Thought Content: Thought content normal.        Judgment: Judgment normal.      LABORATORY DATA:  I have reviewed the labs as listed.  CBC    Component Value Date/Time   WBC 4.7 07/25/2019 0849   RBC 3.92 07/25/2019 0849   HGB 11.9 (L) 07/25/2019 0849   HGB 11.3 12/09/2018 1308   HCT 37.7 07/25/2019 0849   HCT 33.1 (L) 12/09/2018 1308   PLT 55 (L) 07/25/2019 0849   PLT 52 (LL) 12/09/2018 1308   MCV 96.2 07/25/2019 0849   MCV 92 12/09/2018 1308   MCH 30.4 07/25/2019 0849   MCHC 31.6 07/25/2019 0849   RDW 12.6 07/25/2019 0849   RDW 12.2 12/09/2018 1308   LYMPHSABS 2.0 07/25/2019 0849   LYMPHSABS 2.0 12/09/2018 1308   MONOABS 0.4 07/25/2019 0849   EOSABS 0.2 07/25/2019 0849   EOSABS 0.2 12/09/2018 1308   BASOSABS 0.0 07/25/2019 0849   BASOSABS 0.0 12/09/2018 1308   CMP Latest Ref Rng & Units 07/25/2019 01/17/2019 12/16/2018  Glucose 70 - 99 mg/dL 243(H) 208(H) 441(H)  BUN 8 - 23 mg/dL 11 9 -  Creatinine 0.44 - 1.00 mg/dL 0.73 0.74 -  Sodium 135 - 145 mmol/L 137 138 -  Potassium 3.5 - 5.1 mmol/L 4.0 4.6 -  Chloride 98 - 111 mmol/L 102 103 -  CO2 22 - 32 mmol/L 27 29 -  Calcium 8.9 - 10.3 mg/dL 8.6(L) 8.9 -  Total Protein 6.5 - 8.1 g/dL 6.3(L) 6.3(L) -  Total Bilirubin 0.3 - 1.2 mg/dL 0.7 0.5 -  Alkaline Phos 38 - 126 U/L 73 66 -  AST 15 - 41 U/L 30 30 -  ALT 0 - 44 U/L 23 24 -     I personally performed a face-to-face visit.  All questions were answered to patient's stated satisfaction. Encouraged patient to call with any new concerns or questions before his next visit to the cancer center and  we can certain see him sooner, if needed.     ASSESSMENT & PLAN:   Chronic ITP (idiopathic thrombocytopenia) (HCC) 1. Chronic ITP with exacerbations: - Patient has thrombocytopenia since July 2016. -She had a CT abdomen on 02/02/2018 showed normal-sized spleen. -She had a bone marrow biopsy and aspiration on 09/15/2017 showed hypercellular marrow for age, trilineage hematopoiesis with abundant megakaryocytes with no dysplasia. - She was given a prednisone tapering dose over 5 weeks on 12/14/2017 with no improvement in platelet count. -She was given IVIG (1 g/kg) on 12/23/2017 and 12/24/2017 with a peak and platelet count of 92, the response only lasted 3 weeks. - She was given dexamethasone 40 mg for 4 days on 01/11/2018 with improvement of platelet count of 61. -She denies any bleeding problems.  However she does have easy bruising. -She had a pacemaker placed back in January or February due to her bradycardia.  They are monitoring her every month. -Labs on 07/25/2019 showed her platelet count today is 55. -We will continue to monitor her platelets every 3 months.  If they get below 30 we will consider alternate therapy. - We will see her back in 3 months with repeat blood work.  2.  Right middle lobe lung nodule: -CT of the chest on 08/08/2018 showed right middle lobe lung nodule with one-year follow-up recommended. - She  will have a follow-up CT scan in September 2020      Orders placed this encounter:  Orders Placed This Encounter  Procedures  . Lactate dehydrogenase  . CBC with Differential/Platelet  . Comprehensive metabolic panel  . Ferritin  . Iron and TIBC  . Vitamin B12  . VITAMIN D 25 Hydroxy (Vit-D Deficiency, Fractures)  . Folate      Francene Finders, FNP-C [place] [phone]

## 2019-07-25 NOTE — Patient Instructions (Signed)
[facility] Discharge Instructions     Thank you for choosing [facility] to provide your oncology and hematology care.  To afford each patient quality time with our provider, please arrive at least 15 minutes before your scheduled appointment time.   If you have a lab appointment with the Cancer Center please come in thru the Main Entrance and check in at the main information desk.  You need to re-schedule your appointment should you arrive 10 or more minutes late.  We strive to give you quality time with our providers, and arriving late affects you and other patients whose appointments are after yours.  Also, if you no show three or more times for appointments you may be dismissed from the clinic at the providers discretion.     Again, thank you for choosing [facility].  Our hope is that these requests will decrease the amount of time that you wait before being seen by our physicians.       _____________________________________________________________  Should you have questions after your visit to [facility], please contact our office at [phone] between the hours of 8:00 a.m. and 4:30 p.m.  Voicemails left after 4:00 p.m. will not be returned until the following business day.  For prescription refill requests, have your pharmacy contact our office and allow 72 hours.    Due to Covid, you will need to wear a mask upon entering the hospital. If you do not have a mask, a mask will be given to you at the Main Entrance upon arrival. For doctor visits, patients may have 1 support person with them. For treatment visits, patients can not have anyone with them due to social distancing guidelines and our immunocompromised population.      

## 2019-08-05 NOTE — Progress Notes (Signed)
Cardiology Office Note   Date:  08/07/2019   ID:  [name], DOB 1934-02-19, MRN [id number]  PCP:  Idelle Crouch, MD  Cardiologist:   Dorris Carnes, MD   F/U of CAD   History of Present Illness: Madeline [name] is a 85 y.o. female with a history of CAD, s/p inferior STEMI om 2012  She had occlusion of the RCA and underwent PTCA with DES to theprox RCA. It was a right dominant system with dual PDAs. Other vessels had irregularities. She was d/cd home on Brilinta. The pt also has a history of bradycardia and is s/p PPM  I last saw the pt in Sept 2019   She has been seen by Edrick Oh and BSimmons since  Since seen she says her breathing is good  No CP    No dizziness Has DM   GLucose has been 150s   Pt says BP at home in 90s/  Outpatient Medications Prior to Visit  Medication Sig Dispense Refill  . acetaminophen (TYLENOL) 325 MG tablet Take 1-2 tablets (325-650 mg total) by mouth every 6 (six) hours as needed for mild pain or moderate pain.    Marland Kitchen atorvastatin (LIPITOR) 20 MG tablet TAKE 1 TABLET BY MOUTH ONCE DAILY AT 6 PM. PLEAE MAKE ANNUAL APPOINTMENT WITH DR. Eon Zunker FOR FUTURE REFILLS. THANK YOU. 90 tablet 0  . cetirizine (ZYRTEC) 10 MG tablet Take 10 mg by mouth daily.    . citalopram (CELEXA) 20 MG tablet Take 20 mg by mouth daily.      . CONTOUR NEXT TEST test strip USE 1 STRIP TO CHECK BLOOD SUGAR EIGHT TO NINE TIMES DAILY.    Marland Kitchen Insulin Human (INSULIN PUMP) SOLN Inject 1 each into the skin continuous. Used with Novolog insulin    . Lancets 28G MISC Use as instructed. 7-8 times daily.    Marland Kitchen levothyroxine (SYNTHROID, LEVOTHROID) 50 MCG tablet Take 50 mcg by mouth daily before breakfast.   3  . lisinopril (PRINIVIL,ZESTRIL) 20 MG tablet Take 1 tablet (20 mg total) by mouth daily. 30 tablet 1  . Multiple Vitamin (MULTIVITAMIN WITH MINERALS) TABS tablet Take 1 tablet by mouth daily.    Marland Kitchen NOVOLOG 100 UNIT/ML injection Inject 0-50 Units into the skin continuous. Up to 50 units/  day via pump     No facility-administered medications prior to visit.      Allergies:   Darvon, Propoxyphene, and Sulfa antibiotics   Past Medical History:  Diagnosis Date  . Bradycardia   . Coronary artery disease   . Diabetes mellitus   . Dyslipidemia   . GERD (gastroesophageal reflux disease)   . Hypothyroid   . Presence of permanent cardiac pacemaker 12/16/2018   Medtronic dual lead pacemaker    Past Surgical History:  Procedure Laterality Date  . APPENDECTOMY    . BREAST BIOPSY Right 1967   EXCISIONAL - NEG  . CHOLECYSTECTOMY    . CORONARY STENT INTERVENTION    . INSERT / REPLACE / REMOVE PACEMAKER  12/16/2018  . no surgical hx    . PACEMAKER IMPLANT N/A 12/16/2018   Procedure: PACEMAKER IMPLANT;  Surgeon: Evans Lance, MD;  Location: [facility];  Service: Cardiovascular;  Laterality: N/A;     Social History:  The patient  reports that she has been smoking cigarettes. She has a 50.00 pack-year smoking history. She has never used smokeless tobacco. She reports that she does not drink alcohol or use drugs.   Family  History:  The patient's family history includes Breast cancer (age of onset: 5340) in her sister; Breast cancer (age of onset: 7160) in her sister; Cancer in her sister, sister, and sister; Diabetes in her son; Heart Problems in her mother; Other in her father.    ROS:  Please see the history of present illness. All other systems are reviewed and  Negative to the above problem except as noted.    PHYSICAL EXAM: VS:  BP (!) 146/60   Pulse 76   Ht 5\' 2"  (1.575 m)   Wt 114 lb 9.6 oz (52 kg)   SpO2 99%   BMI 20.96 kg/m   GEN: Thin 85 yo in no acute distress  HEENT: normal  Neck: No JVD    Nocarotid bruits, or masses Cardiac: RRR; no murmurs, rubs, or gallops,no edema  Respiratory:  clear to auscultation bilaterally, normal work of breathing GI: soft, nontender, nondistended, + BS  No hepatomegaly  MS: no deformity Moving all extremities   Skin:  warm and dry, no rash Neuro:  Strength and sensation are intact Psych: euthymic mood, full affect   EKG:  EKG not ordered today      Lipid Panel    Component Value Date/Time   CHOL 112 08/12/2018 1118   TRIG 42 08/12/2018 1118   HDL 61 08/12/2018 1118   CHOLHDL 1.8 08/12/2018 1118   CHOLHDL 1.7 08/03/2016 1519   VLDL 19 08/03/2016 1519   LDLCALC 43 08/12/2018 1118      Wt Readings from Last 3 Encounters:  08/07/19 114 lb 9.6 oz (52 kg)  07/25/19 115 lb (52.2 kg)  04/21/19 112 lb 14.4 oz (51.2 kg)      ASSESSMENT AND PLAN:  1  CAD  Pt denies CP   Continue meds     2.  Tob   Counselled again on cessation  She is smoking a ppd     3  HL  Keep on meds   Check lpid today     4  Hx bradycardia    Pt with PPM   WIll make sure has f/u    5  HTN   BP 140s   At home she is 90s   Told her to bring cuff to next visit to make sure accurate    5  DM  WIll check hgb A1C   F/U in AUg 2021   Sooner for problems   Pt to have EP follow up for PPM    Current medicines are reviewed at length with the patient today.  The patient does not have concerns regarding medicines. Signed, Dietrich PatesPaula Brandun Pinn, MD  08/07/2019 8:36 AM    [facility] [address], [place], [place]  [address] Phone: [phone]; Fax: [phone]

## 2019-08-07 ENCOUNTER — Encounter: Payer: Self-pay | Admitting: Internal Medicine

## 2019-08-07 ENCOUNTER — Ambulatory Visit (INDEPENDENT_AMBULATORY_CARE_PROVIDER_SITE_OTHER): Payer: HMO | Admitting: Internal Medicine

## 2019-08-07 ENCOUNTER — Other Ambulatory Visit: Payer: Self-pay

## 2019-08-07 VITALS — BP 146/60 | HR 76 | Ht 62.0 in | Wt 114.6 lb

## 2019-08-07 DIAGNOSIS — I2510 Atherosclerotic heart disease of native coronary artery without angina pectoris: Secondary | ICD-10-CM | POA: Diagnosis not present

## 2019-08-07 DIAGNOSIS — E785 Hyperlipidemia, unspecified: Secondary | ICD-10-CM | POA: Diagnosis not present

## 2019-08-07 NOTE — Patient Instructions (Signed)
Medication Instructions:  No changes If you need a refill on your cardiac medications before your next appointment, please call your pharmacy.   Lab work: Today: lipids, hgA1c  If you have labs (blood work) drawn today and your tests are completely normal, you will receive your results only by: Marland Kitchen MyChart Message (if you have MyChart) OR . A paper copy in the mail If you have any lab test that is abnormal or we need to change your treatment, we will call you to review the results.  Testing/Procedures: none   Follow-Up: At [facility], you and your health needs are our priority.  As part of our continuing mission to provide you with exceptional heart care, we have created designated Provider Care Teams.  These Care Teams include your primary Cardiologist (physician) and Advanced Practice Providers (APPs -  Physician Assistants and Nurse Practitioners) who all work together to provide you with the care you need, when you need it. You will need a follow up appointment in:  12 months.  Please call our office 2 months in advance to schedule this appointment.  You may see Dorris Carnes, MD or one of the following Advanced Practice Providers on your designated Care Team: Richardson Dopp, PA-C [place], [place] . Daune Perch, NP  Any Other Special Instructions Will Be Listed Below (If Applicable).

## 2019-08-08 LAB — LIPID PANEL
Chol/HDL Ratio: 1.9 ratio (ref 0.0–4.4)
Cholesterol, Total: 134 mg/dL (ref 100–199)
HDL: 71 mg/dL (ref >39)
LDL Chol Calc (NIH): 51 mg/dL (ref 0–99)
Triglycerides: 51 mg/dL (ref 0–149)
VLDL Cholesterol Cal: 12 mg/dL (ref 5–40)

## 2019-08-08 LAB — HEMOGLOBIN A1C
Est. average glucose Bld gHb Est-mCnc: 183 mg/dL
Hgb A1c MFr Bld: 8 % — ABNORMAL HIGH (ref 4.8–5.6)

## 2019-08-22 DIAGNOSIS — E038 Other specified hypothyroidism: Secondary | ICD-10-CM | POA: Diagnosis not present

## 2019-08-22 DIAGNOSIS — I10 Essential (primary) hypertension: Secondary | ICD-10-CM | POA: Diagnosis not present

## 2019-08-22 DIAGNOSIS — Z9641 Presence of insulin pump (external) (internal): Secondary | ICD-10-CM | POA: Diagnosis not present

## 2019-08-22 DIAGNOSIS — E1043 Type 1 diabetes mellitus with diabetic autonomic (poly)neuropathy: Secondary | ICD-10-CM | POA: Diagnosis not present

## 2019-08-22 DIAGNOSIS — E063 Autoimmune thyroiditis: Secondary | ICD-10-CM | POA: Diagnosis not present

## 2019-08-22 DIAGNOSIS — F17200 Nicotine dependence, unspecified, uncomplicated: Secondary | ICD-10-CM | POA: Diagnosis not present

## 2019-09-01 IMAGING — CR DG CHEST 2V
2 series · 2 of 2 positions shown · non-contrast
Comparison: Radiographs September 20, 2018.

CLINICAL DATA: Pacemaker in situ.

EXAM:
CHEST - 2 VIEW

[chest pa]
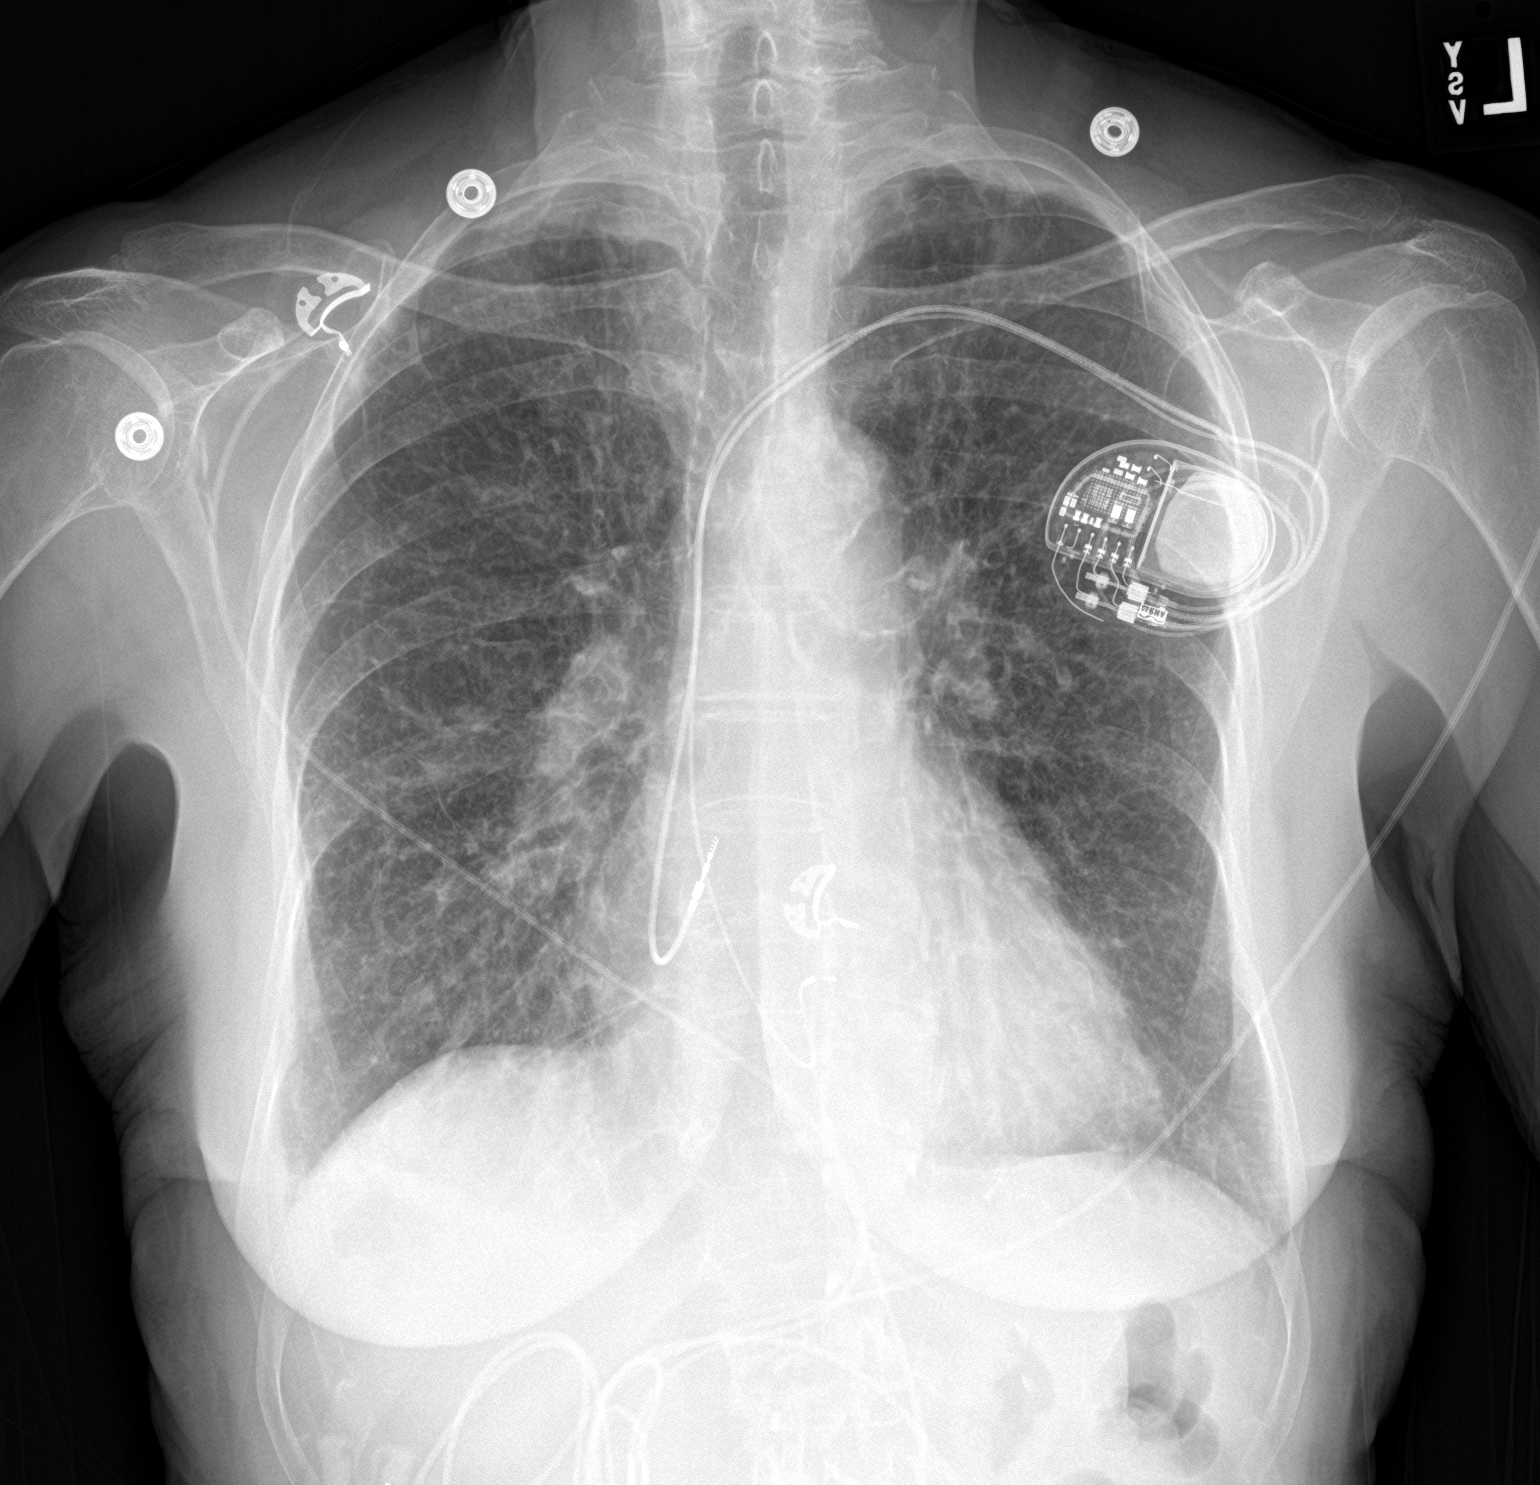

[chest lat]
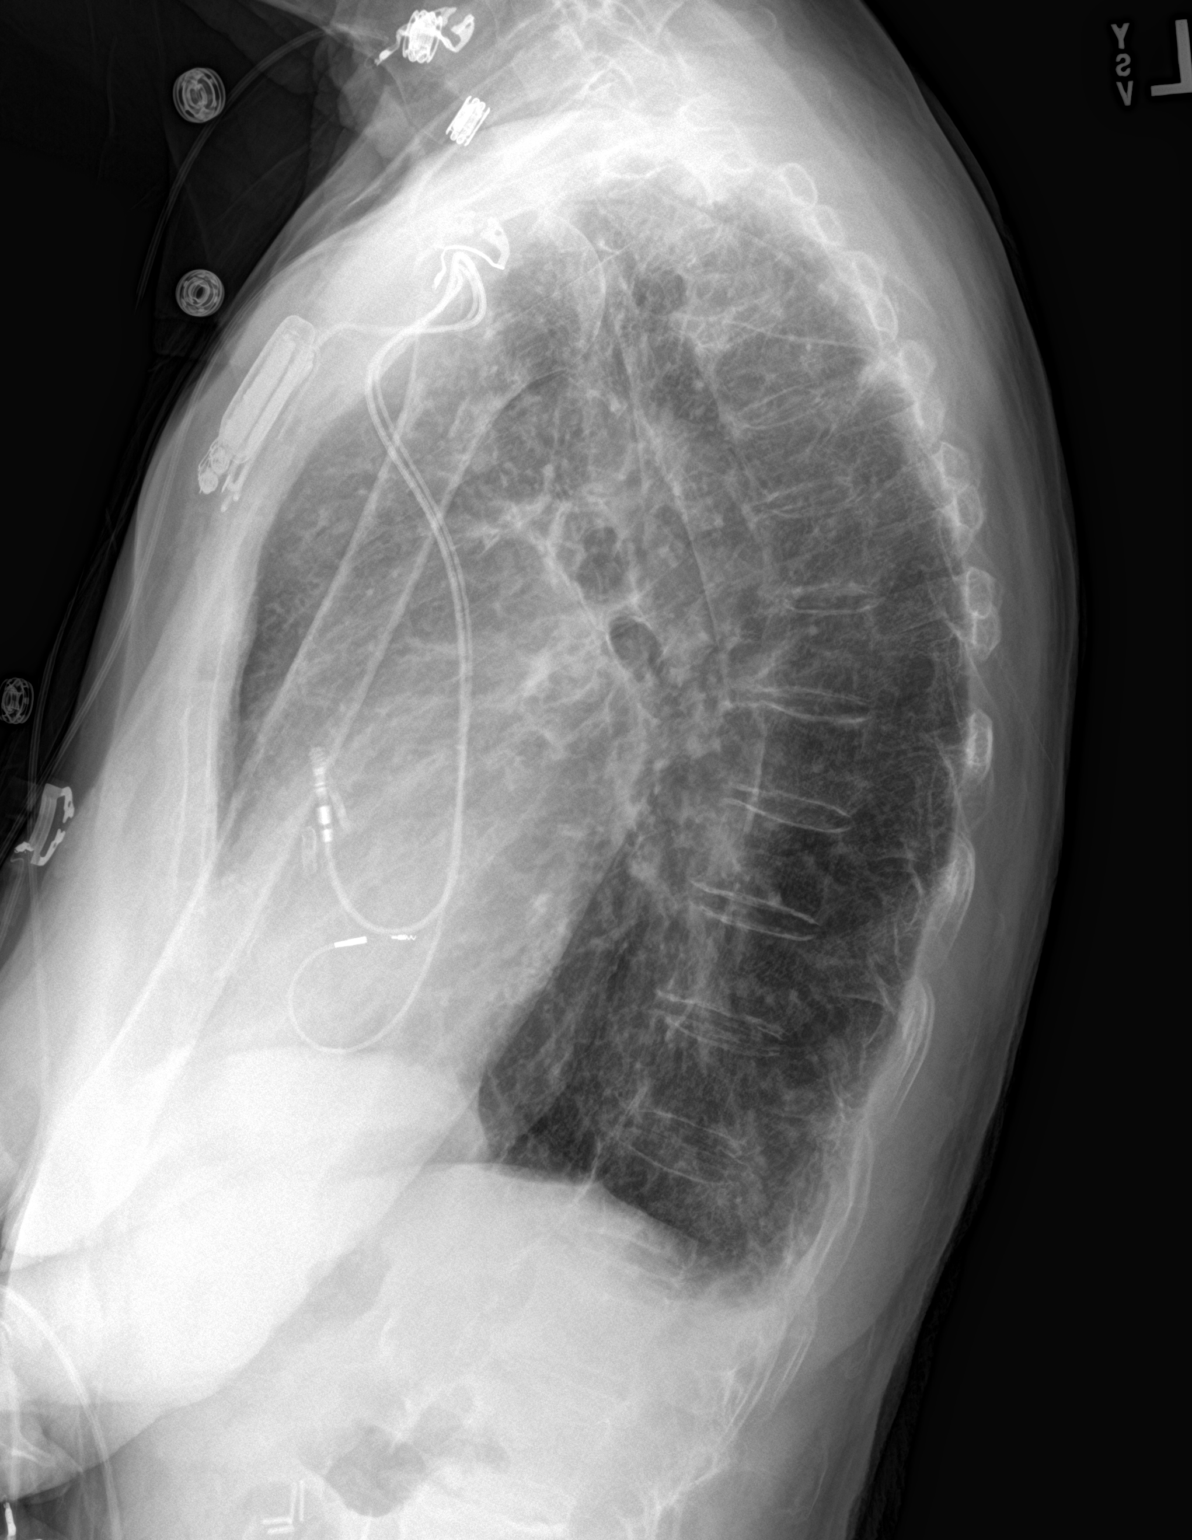

[2 of 2 positions shown; findings below may reference images not displayed]

FINDINGS: The heart size and mediastinal contours are within normal limits.
Atherosclerosis of thoracic aorta is noted. Interval placement of
left-sided pacemaker with leads in grossly good position. No
pneumothorax is noted. No consolidative process is noted. Minimal
bilateral pleural effusions are noted. The visualized skeletal
structures are unremarkable.
IMPRESSION: Interval placement of left-sided pacemaker with leads in grossly
good position. Minimal bilateral pleural effusions.

Aortic Atherosclerosis ([id number].G).

## 2019-09-04 DIAGNOSIS — B351 Tinea unguium: Secondary | ICD-10-CM | POA: Diagnosis not present

## 2019-09-04 DIAGNOSIS — M79675 Pain in left toe(s): Secondary | ICD-10-CM | POA: Diagnosis not present

## 2019-09-04 DIAGNOSIS — M79674 Pain in right toe(s): Secondary | ICD-10-CM | POA: Diagnosis not present

## 2019-09-05 DIAGNOSIS — Z23 Encounter for immunization: Secondary | ICD-10-CM | POA: Diagnosis not present

## 2019-09-07 ENCOUNTER — Other Ambulatory Visit: Payer: Self-pay | Admitting: Internal Medicine

## 2019-09-07 DIAGNOSIS — Z1231 Encounter for screening mammogram for malignant neoplasm of breast: Secondary | ICD-10-CM

## 2019-09-15 ENCOUNTER — Ambulatory Visit (INDEPENDENT_AMBULATORY_CARE_PROVIDER_SITE_OTHER): Payer: HMO | Admitting: *Deleted

## 2019-09-15 DIAGNOSIS — I442 Atrioventricular block, complete: Secondary | ICD-10-CM

## 2019-09-15 LAB — CUP PACEART REMOTE DEVICE CHECK
Battery Remaining Longevity: 34 mo
Battery Voltage: 2.95 V
Brady Statistic AP VP Percent: 0.57 %
Brady Statistic AP VS Percent: 0 %
Brady Statistic AS VP Percent: 99.43 %
Brady Statistic AS VS Percent: 0.01 %
Brady Statistic RA Percent Paced: 0.56 %
Brady Statistic RV Percent Paced: 99.99 %
Date Time Interrogation Session: 20201030024448
Implantable Lead Implant Date: 20200131
Implantable Lead Implant Date: 20200131
Implantable Lead Location: 753859
Implantable Lead Location: 753860
Implantable Lead Model: 3830
Implantable Lead Model: 5076
Implantable Pulse Generator Implant Date: 20200131
Lead Channel Impedance Value: 304 Ohm
Lead Channel Impedance Value: 380 Ohm
Lead Channel Impedance Value: 437 Ohm
Lead Channel Impedance Value: 494 Ohm
Lead Channel Pacing Threshold Amplitude: 0.375 V
Lead Channel Pacing Threshold Amplitude: 1.125 V
Lead Channel Pacing Threshold Pulse Width: 0.4 ms
Lead Channel Pacing Threshold Pulse Width: 0.4 ms
Lead Channel Sensing Intrinsic Amplitude: 3 mV
Lead Channel Sensing Intrinsic Amplitude: 3 mV
Lead Channel Setting Pacing Amplitude: 1.5 V
Lead Channel Setting Pacing Amplitude: 5 V
Lead Channel Setting Pacing Pulse Width: 1 ms
Lead Channel Setting Sensing Sensitivity: 4 mV

## 2019-09-27 NOTE — Progress Notes (Signed)
Remote pacemaker transmission.   

## 2019-09-29 DIAGNOSIS — I10 Essential (primary) hypertension: Secondary | ICD-10-CM | POA: Diagnosis not present

## 2019-09-29 DIAGNOSIS — J431 Panlobular emphysema: Secondary | ICD-10-CM | POA: Diagnosis not present

## 2019-09-29 DIAGNOSIS — Z79899 Other long term (current) drug therapy: Secondary | ICD-10-CM | POA: Diagnosis not present

## 2019-09-29 DIAGNOSIS — E7800 Pure hypercholesterolemia, unspecified: Secondary | ICD-10-CM | POA: Diagnosis not present

## 2019-09-29 DIAGNOSIS — E039 Hypothyroidism, unspecified: Secondary | ICD-10-CM | POA: Diagnosis not present

## 2019-09-29 DIAGNOSIS — E1042 Type 1 diabetes mellitus with diabetic polyneuropathy: Secondary | ICD-10-CM | POA: Diagnosis not present

## 2019-09-29 DIAGNOSIS — E1043 Type 1 diabetes mellitus with diabetic autonomic (poly)neuropathy: Secondary | ICD-10-CM | POA: Diagnosis not present

## 2019-09-29 DIAGNOSIS — Z0000 Encounter for general adult medical examination without abnormal findings: Secondary | ICD-10-CM | POA: Diagnosis not present

## 2019-09-29 DIAGNOSIS — D696 Thrombocytopenia, unspecified: Secondary | ICD-10-CM | POA: Diagnosis not present

## 2019-10-06 ENCOUNTER — Other Ambulatory Visit: Payer: Self-pay

## 2019-10-06 NOTE — Patient Outreach (Signed)
  [facility]) Care Management Chronic Special Needs Program  10/06/2019  Name: BARBI KUMAGAI DOB: 03-30-1934  MRN: [id number]  Ms. Esmay Amspacher is enrolled in a chronic special needs plan for Diabetes. Reviewed and updated care plan.  Subjective: Client states that she has been doing good states she is to see Dr. Gabriel Carina on 10/27/19.  States her blood sugars usually range from 100-200 in the morning.  States she sometimes forgets to bolus her insulin when she has a snack and that is when her sugars go up.  States she does like a cookie or candy as a snack.  States she tries to walk around her property twice a day for exercise.  States she does not go too low very often  Goals Addressed            This Visit's Progress   . Client understands the importance of follow-up with providers by attending scheduled visits   On track   . Client will use Assistive Devices as needed and verbalize understanding of device use   On track    [employer] approved meters-One Touch, Freestyle or Precision     . Client will verbalize knowledge of self management of Hypertension as evidences by BP reading of 140/90 or less; or as defined by provider   On track   . Decrease inpatient admissions/ readmissions with in the next year   On track   . Maintain timely refills of diabetic medication as prescribed within the year .   On track   . COMPLETED: Obtain annual  Lipid Profile, LDL-C       Completed 07/04/19    . COMPLETED: Obtain Annual Eye (retinal)  Exam        Completed 12/22/18    . COMPLETED: Obtain Annual Foot Exam       Completed 04/13/19    . COMPLETED: Obtain annual screen for micro albuminuria (urine) , nephropathy (kidney problems)       Completed 12/15/18    . COMPLETED: Obtain Hemoglobin A1C at least 2 times per year       Completed 11/25/18,03/24/19, 07/04/19    . COMPLETED: Visit Primary Care Provider or Endocrinologist at least 2 times per year        Visits  03/24/19, 07/03/19,08/22/19     Client is not meeting diabetes self management goal of hemoglobin A1C of <7% with last reading of 8.7 Reinforced to remember to take her bolus insulin when she eats a snack Instructed to try to include some protein with her snacks and to try to limit her sweets Reviewed CHO counting and portion sizes Encouraged to continue to walk regularly Reviewed s/s of hypoglycemia and how to treat Reviewed number for 24-hour nurse Line Reviewed COVID 19 precautions  Plan:  Send successful outreach letter with a copy of their individualized care plan and Send individual care plan to provider  Chronic care management coordinator will outreach in:  4-6 Months     Peter Garter RN, [facility], [place] Management Coordinator [place] [facility] [phone]

## 2019-10-16 DIAGNOSIS — E1065 Type 1 diabetes mellitus with hyperglycemia: Secondary | ICD-10-CM | POA: Diagnosis not present

## 2019-10-20 DIAGNOSIS — E039 Hypothyroidism, unspecified: Secondary | ICD-10-CM | POA: Diagnosis not present

## 2019-10-20 DIAGNOSIS — E1043 Type 1 diabetes mellitus with diabetic autonomic (poly)neuropathy: Secondary | ICD-10-CM | POA: Diagnosis not present

## 2019-10-20 DIAGNOSIS — Z79899 Other long term (current) drug therapy: Secondary | ICD-10-CM | POA: Diagnosis not present

## 2019-10-21 ENCOUNTER — Other Ambulatory Visit: Payer: Self-pay | Admitting: Internal Medicine

## 2019-10-24 ENCOUNTER — Other Ambulatory Visit (HOSPITAL_COMMUNITY): Payer: HMO

## 2019-10-24 ENCOUNTER — Ambulatory Visit (HOSPITAL_COMMUNITY): Payer: HMO | Admitting: Nurse Practitioner

## 2019-10-25 ENCOUNTER — Other Ambulatory Visit: Payer: Self-pay

## 2019-10-25 ENCOUNTER — Ambulatory Visit
Admission: RE | Admit: 2019-10-25 | Discharge: 2019-10-25 | Disposition: A | Discharge status: Home / Self Care | Payer: HMO | Source: Ambulatory Visit | Attending: Internal Medicine | Admitting: Internal Medicine

## 2019-10-25 DIAGNOSIS — Z1231 Encounter for screening mammogram for malignant neoplasm of breast: Secondary | ICD-10-CM | POA: Diagnosis not present

## 2019-10-27 DIAGNOSIS — E1065 Type 1 diabetes mellitus with hyperglycemia: Secondary | ICD-10-CM | POA: Diagnosis not present

## 2019-10-27 DIAGNOSIS — F17200 Nicotine dependence, unspecified, uncomplicated: Secondary | ICD-10-CM | POA: Diagnosis not present

## 2019-10-27 DIAGNOSIS — E1043 Type 1 diabetes mellitus with diabetic autonomic (poly)neuropathy: Secondary | ICD-10-CM | POA: Diagnosis not present

## 2019-10-27 DIAGNOSIS — Z9641 Presence of insulin pump (external) (internal): Secondary | ICD-10-CM | POA: Diagnosis not present

## 2019-10-27 DIAGNOSIS — I10 Essential (primary) hypertension: Secondary | ICD-10-CM | POA: Diagnosis not present

## 2019-12-15 ENCOUNTER — Ambulatory Visit (INDEPENDENT_AMBULATORY_CARE_PROVIDER_SITE_OTHER): Payer: HMO | Admitting: *Deleted

## 2019-12-15 DIAGNOSIS — I442 Atrioventricular block, complete: Secondary | ICD-10-CM

## 2019-12-15 LAB — CUP PACEART REMOTE DEVICE CHECK
Battery Remaining Longevity: 31 mo
Battery Voltage: 2.95 V
Brady Statistic AP VP Percent: 2.65 %
Brady Statistic AP VS Percent: 0 %
Brady Statistic AS VP Percent: 97.34 %
Brady Statistic AS VS Percent: 0.01 %
Brady Statistic RA Percent Paced: 2.6 %
Brady Statistic RV Percent Paced: 99.99 %
Date Time Interrogation Session: 20210129084303
Implantable Lead Implant Date: 20200131
Implantable Lead Implant Date: 20200131
Implantable Lead Location: 753859
Implantable Lead Location: 753860
Implantable Lead Model: 3830
Implantable Lead Model: 5076
Implantable Pulse Generator Implant Date: 20200131
Lead Channel Impedance Value: 285 Ohm
Lead Channel Impedance Value: 361 Ohm
Lead Channel Impedance Value: 437 Ohm
Lead Channel Impedance Value: 456 Ohm
Lead Channel Pacing Threshold Amplitude: 0.375 V
Lead Channel Pacing Threshold Amplitude: 1.125 V
Lead Channel Pacing Threshold Pulse Width: 0.4 ms
Lead Channel Pacing Threshold Pulse Width: 0.4 ms
Lead Channel Sensing Intrinsic Amplitude: 2.5 mV
Lead Channel Sensing Intrinsic Amplitude: 2.5 mV
Lead Channel Setting Pacing Amplitude: 1.5 V
Lead Channel Setting Pacing Amplitude: 5 V
Lead Channel Setting Pacing Pulse Width: 1 ms
Lead Channel Setting Sensing Sensitivity: 4 mV

## 2019-12-15 NOTE — Progress Notes (Signed)
PPM Remote  

## 2019-12-28 DIAGNOSIS — J449 Chronic obstructive pulmonary disease, unspecified: Secondary | ICD-10-CM | POA: Diagnosis not present

## 2019-12-28 DIAGNOSIS — I10 Essential (primary) hypertension: Secondary | ICD-10-CM | POA: Diagnosis not present

## 2019-12-28 DIAGNOSIS — E7800 Pure hypercholesterolemia, unspecified: Secondary | ICD-10-CM | POA: Diagnosis not present

## 2019-12-28 DIAGNOSIS — J431 Panlobular emphysema: Secondary | ICD-10-CM | POA: Diagnosis not present

## 2019-12-28 DIAGNOSIS — Z79899 Other long term (current) drug therapy: Secondary | ICD-10-CM | POA: Diagnosis not present

## 2019-12-28 DIAGNOSIS — E1042 Type 1 diabetes mellitus with diabetic polyneuropathy: Secondary | ICD-10-CM | POA: Diagnosis not present

## 2019-12-28 DIAGNOSIS — D696 Thrombocytopenia, unspecified: Secondary | ICD-10-CM | POA: Diagnosis not present

## 2019-12-28 DIAGNOSIS — E039 Hypothyroidism, unspecified: Secondary | ICD-10-CM | POA: Diagnosis not present

## 2019-12-29 ENCOUNTER — Ambulatory Visit: Payer: Self-pay

## 2020-01-18 DIAGNOSIS — E1065 Type 1 diabetes mellitus with hyperglycemia: Secondary | ICD-10-CM | POA: Diagnosis not present

## 2020-01-30 DIAGNOSIS — E1065 Type 1 diabetes mellitus with hyperglycemia: Secondary | ICD-10-CM | POA: Diagnosis not present

## 2020-01-31 ENCOUNTER — Other Ambulatory Visit: Payer: Self-pay

## 2020-01-31 ENCOUNTER — Emergency Department
Admission: EM | Admit: 2020-01-31 | Discharge: 2020-01-31 | Disposition: A | Discharge status: Home / Self Care | Payer: HMO | Attending: Emergency Medicine | Admitting: Emergency Medicine

## 2020-01-31 DIAGNOSIS — I10 Essential (primary) hypertension: Secondary | ICD-10-CM | POA: Diagnosis not present

## 2020-01-31 DIAGNOSIS — Z9049 Acquired absence of other specified parts of digestive tract: Secondary | ICD-10-CM | POA: Diagnosis not present

## 2020-01-31 DIAGNOSIS — E11649 Type 2 diabetes mellitus with hypoglycemia without coma: Secondary | ICD-10-CM | POA: Diagnosis not present

## 2020-01-31 DIAGNOSIS — E1065 Type 1 diabetes mellitus with hyperglycemia: Secondary | ICD-10-CM | POA: Diagnosis not present

## 2020-01-31 DIAGNOSIS — F17200 Nicotine dependence, unspecified, uncomplicated: Secondary | ICD-10-CM | POA: Diagnosis not present

## 2020-01-31 DIAGNOSIS — E1043 Type 1 diabetes mellitus with diabetic autonomic (poly)neuropathy: Secondary | ICD-10-CM | POA: Diagnosis not present

## 2020-01-31 DIAGNOSIS — E039 Hypothyroidism, unspecified: Secondary | ICD-10-CM | POA: Insufficient documentation

## 2020-01-31 DIAGNOSIS — I2510 Atherosclerotic heart disease of native coronary artery without angina pectoris: Secondary | ICD-10-CM | POA: Insufficient documentation

## 2020-01-31 DIAGNOSIS — W19XXXA Unspecified fall, initial encounter: Secondary | ICD-10-CM | POA: Diagnosis not present

## 2020-01-31 DIAGNOSIS — E161 Other hypoglycemia: Secondary | ICD-10-CM | POA: Diagnosis not present

## 2020-01-31 DIAGNOSIS — Z79899 Other long term (current) drug therapy: Secondary | ICD-10-CM | POA: Diagnosis not present

## 2020-01-31 DIAGNOSIS — F17210 Nicotine dependence, cigarettes, uncomplicated: Secondary | ICD-10-CM | POA: Diagnosis not present

## 2020-01-31 DIAGNOSIS — Z9641 Presence of insulin pump (external) (internal): Secondary | ICD-10-CM | POA: Diagnosis not present

## 2020-01-31 DIAGNOSIS — Z950 Presence of cardiac pacemaker: Secondary | ICD-10-CM | POA: Insufficient documentation

## 2020-01-31 DIAGNOSIS — E162 Hypoglycemia, unspecified: Secondary | ICD-10-CM | POA: Diagnosis not present

## 2020-01-31 DIAGNOSIS — Z794 Long term (current) use of insulin: Secondary | ICD-10-CM | POA: Diagnosis not present

## 2020-01-31 LAB — CBC
HCT: 41.8 % (ref 36.0–46.0)
Hemoglobin: 13.7 g/dL (ref 12.0–15.0)
MCH: 30.9 pg (ref 26.0–34.0)
MCHC: 32.8 g/dL (ref 30.0–36.0)
MCV: 94.4 fL (ref 80.0–100.0)
Platelets: 94 10*3/uL — ABNORMAL LOW (ref 150–400)
RBC: 4.43 MIL/uL (ref 3.87–5.11)
RDW: 12.2 % (ref 11.5–15.5)
WBC: 7.9 10*3/uL (ref 4.0–10.5)
nRBC: 0 % (ref 0.0–0.2)

## 2020-01-31 LAB — BASIC METABOLIC PANEL
Anion gap: 11 (ref 5–15)
BUN: 12 mg/dL (ref 8–23)
CO2: 24 mmol/L (ref 22–32)
Calcium: 8.9 mg/dL (ref 8.9–10.3)
Chloride: 104 mmol/L (ref 98–111)
Creatinine, Ser: 0.75 mg/dL (ref 0.44–1.00)
GFR calc Af Amer: >60 mL/min (ref >60)
GFR calc non Af Amer: >60 mL/min (ref >60)
Glucose, Bld: 107 mg/dL — ABNORMAL HIGH (ref 70–99)
Potassium: 4 mmol/L (ref 3.5–5.1)
Sodium: 139 mmol/L (ref 135–145)

## 2020-01-31 LAB — GLUCOSE, CAPILLARY
Glucose-Capillary: 94 mg/dL (ref 70–99)
Glucose-Capillary: 224 mg/dL — ABNORMAL HIGH (ref 70–99)
Glucose-Capillary: 265 mg/dL — ABNORMAL HIGH (ref 70–99)

## 2020-01-31 NOTE — ED Notes (Signed)
See triage note, pt reports she was sent to ED for hypoglycemia while getting her second COVID vaccine. CBG 265 at this time

## 2020-01-31 NOTE — ED Provider Notes (Signed)
[facility] Emergency Department Provider Note  ____________________________________________   First MD Initiated Contact with Patient 01/31/20 1441     (approximate)  I have reviewed the triage vital signs and the nursing notes.   HISTORY  Chief Complaint Hypoglycemia   HPI Madeline [name] is a 86 y.o. female with below list of chronic medical conditions including diabetes mellitus presents to the emergency department secondary to hypoglycemia which was noted today when the patient presented to vaccination site.  Patient states that she has had multiple episodes of insulin pump malfunction in the past with recommendation to have it replaced and that she has an appointment with her endocrinologist in a week.  Patient's glucose was 40 per EMS and was given 100 mL of D10 with resultant glucose 210.  On arrival to the emergency department the patient's glucose is 94        Past Medical History:  Diagnosis Date  . Bradycardia   . Coronary artery disease   . Diabetes mellitus   . Dyslipidemia   . GERD (gastroesophageal reflux disease)   . Hypothyroid   . Presence of permanent cardiac pacemaker 12/16/2018   Medtronic dual lead pacemaker    Patient Active Problem List   Diagnosis Date Noted  . Complete heart block (HCC) 12/16/2018  . AV block, Mobitz 2 12/16/2018  . Bradycardia 09/21/2018  . Diabetes (HCC) 09/21/2018  . Chronic ITP (idiopathic thrombocytopenia) (HCC) 02/04/2018  . Thrombocytopenia (HCC) 10/28/2016  . CAD (coronary artery disease) 08/14/2011  . Dyslipidemia 08/14/2011  . Tobacco abuse 08/14/2011    Past Surgical History:  Procedure Laterality Date  . APPENDECTOMY    . BREAST BIOPSY Right 1967   EXCISIONAL - NEG  . CHOLECYSTECTOMY    . CORONARY STENT INTERVENTION    . INSERT / REPLACE / REMOVE PACEMAKER  12/16/2018  . no surgical hx    . PACEMAKER IMPLANT N/A 12/16/2018   Procedure: PACEMAKER IMPLANT;  Surgeon: Marinus Maw, MD;  Location: [facility] INVASIVE CV LAB;  Service: Cardiovascular;  Laterality: N/A;    Prior to Admission medications   Medication Sig Start Date End Date Taking? Authorizing Provider  acetaminophen (TYLENOL) 325 MG tablet Take 1-2 tablets (325-650 mg total) by mouth every 6 (six) hours as needed for mild pain or moderate pain. 12/17/18   Barrett, Joline Salt, PA-C  atorvastatin (LIPITOR) 20 MG tablet TAKE 1 TABLET BY MOUTH ONCE DAILY AT  6PM.  PLEASE  MAKE  ANNUAL  APPOINTMENT  WITH  MD  FOR  FUTURE  REFILLS 10/24/19   Pricilla Riffle, MD  cetirizine (ZYRTEC) 10 MG tablet Take 10 mg by mouth daily.    [provider]  citalopram (CELEXA) 20 MG tablet Take 20 mg by mouth daily.      [provider]  CONTOUR NEXT TEST test strip USE 1 STRIP TO CHECK BLOOD SUGAR EIGHT TO NINE TIMES DAILY. 03/27/19   [provider]  Insulin Human (INSULIN PUMP) SOLN Inject 1 each into the skin continuous. Used with Novolog insulin    [provider]  Lancets 28G MISC Use as instructed. 7-8 times daily. 06/02/19   [provider]  levothyroxine (SYNTHROID, LEVOTHROID) 50 MCG tablet Take 50 mcg by mouth daily before breakfast.  05/16/18   [provider]  lisinopril (PRINIVIL,ZESTRIL) 20 MG tablet Take 1 tablet (20 mg total) by mouth daily. 09/22/18 12/10/19  Houston Siren, MD  Multiple Vitamin (MULTIVITAMIN WITH MINERALS) TABS tablet  Take 1 tablet by mouth daily.    [provider]  NOVOLOG 100 UNIT/ML injection Inject 0-50 Units into the skin continuous. Up to 50 units/ day via pump 12/08/17   [provider]    Allergies Darvon, Propoxyphene, and Sulfa antibiotics  Family History  Problem Relation Age of Onset  . Heart Problems Mother   . Other Father        UNK  . Cancer Sister        BREAST  . Breast cancer Sister 26  . Cancer Sister        BREAST  . Breast cancer Sister 63  . Cancer Sister        BREAST  . Diabetes Son     Social  History Social History   Tobacco Use  . Smoking status: Current Every Day Smoker    Packs/day: 1.00    Years: 50.00    Pack years: 50.00    Types: Cigarettes  . Smokeless tobacco: Never Used  Substance Use Topics  . Alcohol use: No  . Drug use: No    Review of Systems Constitutional: No fever/chills Eyes: No visual changes. ENT: No sore throat. Cardiovascular: Denies chest pain. Respiratory: Denies shortness of breath. Gastrointestinal: No abdominal pain.  No nausea, no vomiting.  No diarrhea.  No constipation. Genitourinary: Negative for dysuria. Musculoskeletal: Negative for neck pain.  Negative for back pain. Integumentary: Negative for rash. Neurological: Negative for headaches, focal weakness or numbness. Endocrine: Positive for hypoglycemia  ____________________________________________   PHYSICAL EXAM:  VITAL SIGNS: ED Triage Vitals  Enc Vitals Group     BP 01/31/20 1145 (!) 140/93     Pulse Rate 01/31/20 1145 83     Resp 01/31/20 1145 18     Temp 01/31/20 1145 98 F (36.7 C)     Temp Source 01/31/20 1145 Oral     SpO2 01/31/20 1145 98 %     Weight 01/31/20 1128 51.7 kg (114 lb)     Height 01/31/20 1128 1.575 m (5\' 2" )     Head Circumference --      Peak Flow --      Pain Score 01/31/20 1128 0     Pain Loc --      Pain Edu? --      Excl. in GC? --     Constitutional: Alert and oriented.  Eyes: Conjunctivae are normal.  Mouth/Throat: Patient is wearing a mask. Neck: No stridor.  No meningeal signs.   Cardiovascular: Normal rate, regular rhythm. Good peripheral circulation. Grossly normal heart sounds. Respiratory: Normal respiratory effort.  No retractions. Gastrointestinal: Soft and nontender. No distention.  Musculoskeletal: No lower extremity tenderness nor edema. No gross deformities of extremities. Neurologic:  Normal speech and language. No gross focal neurologic deficits are appreciated.  Skin:  Skin is warm, dry and intact. Psychiatric: Mood  and affect are normal. Speech and behavior are normal.  ____________________________________________   LABS (all labs ordered are listed, but only abnormal results are displayed)  Labs Reviewed  BASIC METABOLIC PANEL - Abnormal; Notable for the following components:      Result Value   Glucose, Bld 107 (*)    All other components within normal limits  CBC - Abnormal; Notable for the following components:   Platelets 94 (*)    All other components within normal limits  GLUCOSE, CAPILLARY - Abnormal; Notable for the following components:   Glucose-Capillary 224 (*)    All other components within normal limits  GLUCOSE, CAPILLARY - Abnormal; Notable for the following components:   Glucose-Capillary 265 (*)    All other components within normal limits  GLUCOSE, CAPILLARY   __________ R  Procedures   ____________________________________________   INITIAL IMPRESSION / MDM / ASSESSMENT AND PLAN / ED COURSE  As part of my medical decision making, I reviewed the following data within the electronic MEDICAL RECORD NUMBER   86 year old female presented with above-stated history and physical exam secondary to hypoglycemia.  Patient's insulin pump disconnected repeat glucose 224 and subsequently 265.  Patient discussed with Dr. Gabriel Carina the patient's endocrinologist who agreed to see the patient in clinic today and as such patient was discharged and escorted to Dr. Joycie Peek clinic.     ____________________________________________  FINAL CLINICAL IMPRESSION(S) / ED DIAGNOSES  Final diagnoses:  Hypoglycemia     MEDICATIONS GIVEN DURING THIS VISIT:  Medications - No data to display   ED Discharge Orders    None      *Please note:  Madeline [name] was evaluated in Emergency Department on 01/31/2020 for the symptoms described in the history of present illness. She was evaluated in the context of the global COVID-19 pandemic, which necessitated consideration that the patient might be  at risk for infection with the SARS-CoV-2 virus that causes COVID-19. Institutional protocols and algorithms that pertain to the evaluation of patients at risk for COVID-19 are in a state of rapid change based on information released by regulatory bodies including the CDC and federal and state organizations. These policies and algorithms were followed during the patient's care in the ED.  Some ED evaluations and interventions may be delayed as a result of limited staffing during the pandemic.*  Note:  This document was prepared using Dragon voice recognition software and may include unintentional dictation errors.   Gregor Hams, MD 01/31/20 2127

## 2020-01-31 NOTE — ED Notes (Signed)
Pt given peanut butter and crackers with a sprite.

## 2020-01-31 NOTE — ED Triage Notes (Signed)
EMS was called to the vaccination site due to pt CBG 40, pt was given D10 CBG 210, states her insulin pump has been malfunctioning and her PCP is aware and instructed the pt to get a new pump, this has happened 3 times. #20Rhand

## 2020-01-31 NOTE — ED Notes (Signed)
Battery removed from insulin pump

## 2020-02-07 ENCOUNTER — Ambulatory Visit: Payer: Self-pay

## 2020-02-09 DIAGNOSIS — E1065 Type 1 diabetes mellitus with hyperglycemia: Secondary | ICD-10-CM | POA: Diagnosis not present

## 2020-02-09 DIAGNOSIS — Z9641 Presence of insulin pump (external) (internal): Secondary | ICD-10-CM | POA: Diagnosis not present

## 2020-02-09 DIAGNOSIS — F17200 Nicotine dependence, unspecified, uncomplicated: Secondary | ICD-10-CM | POA: Diagnosis not present

## 2020-02-09 DIAGNOSIS — E063 Autoimmune thyroiditis: Secondary | ICD-10-CM | POA: Diagnosis not present

## 2020-02-09 DIAGNOSIS — E1043 Type 1 diabetes mellitus with diabetic autonomic (poly)neuropathy: Secondary | ICD-10-CM | POA: Diagnosis not present

## 2020-02-09 DIAGNOSIS — E10649 Type 1 diabetes mellitus with hypoglycemia without coma: Secondary | ICD-10-CM | POA: Diagnosis not present

## 2020-02-09 DIAGNOSIS — E038 Other specified hypothyroidism: Secondary | ICD-10-CM | POA: Diagnosis not present

## 2020-02-14 DIAGNOSIS — Z794 Long term (current) use of insulin: Secondary | ICD-10-CM | POA: Diagnosis not present

## 2020-02-14 DIAGNOSIS — E1065 Type 1 diabetes mellitus with hyperglycemia: Secondary | ICD-10-CM | POA: Diagnosis not present

## 2020-02-14 DIAGNOSIS — E1043 Type 1 diabetes mellitus with diabetic autonomic (poly)neuropathy: Secondary | ICD-10-CM | POA: Diagnosis not present

## 2020-03-14 ENCOUNTER — Other Ambulatory Visit: Payer: Self-pay

## 2020-03-14 NOTE — Patient Outreach (Signed)
[facility]) Care Management Chronic Special Needs Program  03/14/2020  Name: Madeline [name] DOB: [birth year]  MRN: [id number]  [name] is enrolled in a chronic special needs plan for Diabetes. Chronic Care Management Coordinator telephoned client to complete health risk assessment and to develop individualized care plan.  Reviewed the chronic care management program, importance of client participation, and taking their care plan to all provider appointments and inpatient facilities.  Reviewed the transition of care process and possible referral to community care management.  Subjective: Client states that she thinks she is doing good.  States she saw her endocrinologist a month ago.  States she now has a [employer] 2 and she scans before meals and at bedtime.  States her morning readings range from 100-200 and she is usually below 180 before meals.  States she rarely goes low but she did have a low a few months ago when she was out getting her COVID shot and she had not eaten enough. States she is counting her CHO and giving her insulin bolus before meals.  States she does snack in the evening and sometimes does not give her bolus. States her heart is doing good with no chest pains or shortness of breath and her pacemaker is working good.  States her B/P is under control when she checks at home and at the doctors.  States she is walking around 20 minutes a day when the weather is good.  States she is still smoking about 1/2-1 pack a day and does not wish to stop at this time.  Denies any issues affording her medications or supplies.  Goals Addressed            This Visit's Progress   . COMPLETED: Client understands the importance of follow-up with providers by attending scheduled visits   On track    Attending scheduled appointments  Reinforced to keep scheduled appointments with providers Goal completed 03/14/20    . Client will report no worsening of symptoms  related to heart disease within the next 6 months   On track    Reports no issues with heart disease Reviewed signs and symptoms to call doctor and when to call 911 Follow a low salt diet  Sent EMMI Heart disease in diabetics    . COMPLETED: Client will use Assistive Devices as needed and verbalize understanding of device use   On track    Reports no issues with continuous glucose monitor or insulin pump Goal completed 03/14/20    . Client will verbalize knowledge of self management of Hypertension as evidences by BP reading of 140/90 or less; or as defined by provider   On track    Reports B/P less than 140/90 when checking Plan to check B/P regularly Take B/P medications as ordered Plan to follow a low salt diet  Increase activity as tolerated Reviewed lifestyle modification - low sodium diet, smoking cessation, medication compliance, exercise and stress Sent EMMI: High Blood Pressure (Hypertension) What you can do     . Decrease inpatient admissions/ readmissions with in the next year   On track    No admissions     . Diabetes Patient stated goal to go to see eye doctor in the next 6 months (pt-stated)       Instructed on importance of having a dilated eye exam each year  Client has an eye doctor and she plans to call to schedule an appointment    . HEMOGLOBIN A1C <  7.0       Last Hemoglobin A1C 8.9% Reviewed fasting blood sugar goals of 80-130 and less than 180 1 1/2-2 hours after meals Reinforced to follow a low carbohydrate low salt diet and to watch portion sizes Reviewed to give insulin bolus before eating Reviewed signs and symptoms of hypoglycemia and actions to take Reviewed diabetes action plan in [facility] calendar    . COMPLETED: Maintain timely refills of diabetic medication as prescribed within the year .   On track    Maintaining timely refills of medications per dispense report Reinforced importance of getting medications refilled on time Goal  completed 03/14/20    . Obtain annual  Lipid Profile, LDL-C   On track    Last completed 10/20/19 LDL 91 The goal for LDL is less than 70 mg/dL as you are at high risk for complications Try to avoid saturated fats, trans-fats and eat more fiber Plan to take statin as ordered    . Obtain Annual Eye (retinal)  Exam    On track    Remote exam completed at provider office on 12/28/19 Plan to have a dilated eye exam every year    . Obtain Annual Foot Exam   On track    Completed 02/09/20 Check your skin and feet every day for cuts, bruises, redness, blisters, or sores. Report to provider any problems with your feet Schedule a foot exam with your health care provider once every year    . Obtain annual screen for micro albuminuria (urine) , nephropathy (kidney problems)   On track    Last completed 12/15/18 It is important for your doctor to check your urine for protein at least every year    . Obtain Hemoglobin A1C at least 2 times per year   On track    Completed 01/30/20 It is important to have your Hemoglobin A1C checked every 6 months if you are at goal and every 3 months if you are not at goal    . Quit Smoking       Encouraged to cut back or quit smoking  Client declines referral to [facility] Coach for smoking cessation and diet counseling Sent EMMI- Quitting smoking for older adults    . Visit Primary Care Provider or Endocrinologist at least 2 times per year    On track    Primary care provider 12/28/19 Endocrinologist 02/09/20 Please schedule your annual wellness visit     Client is not meeting diabetes self management goal of hemoglobin A1C of <7% with last reading of 8.9% Client declines referral to [facility] for smoking cessation and diet counseling Reviewed number for 24-hour nurse Line Reviewed COVID 19 precautions  Plan:  Send successful outreach letter with a copy of their individualized care plan, Send individual care  plan to provider and Send educational material-EMMI-High Blood Pressure(Hypertension): What you can do, Heart disease in diabetics, Quitting smoking for older adults  Chronic care management coordination will outreach in:  6 Months     Dudley Major RN, [facility], CDE Chronic Care Management Coordinator [facility] Care Management [phone]

## 2020-03-15 ENCOUNTER — Ambulatory Visit (INDEPENDENT_AMBULATORY_CARE_PROVIDER_SITE_OTHER): Payer: HMO | Admitting: *Deleted

## 2020-03-15 DIAGNOSIS — E1043 Type 1 diabetes mellitus with diabetic autonomic (poly)neuropathy: Secondary | ICD-10-CM | POA: Diagnosis not present

## 2020-03-15 DIAGNOSIS — E1065 Type 1 diabetes mellitus with hyperglycemia: Secondary | ICD-10-CM | POA: Diagnosis not present

## 2020-03-15 DIAGNOSIS — I442 Atrioventricular block, complete: Secondary | ICD-10-CM

## 2020-03-15 DIAGNOSIS — M79675 Pain in left toe(s): Secondary | ICD-10-CM | POA: Diagnosis not present

## 2020-03-15 DIAGNOSIS — M79674 Pain in right toe(s): Secondary | ICD-10-CM | POA: Diagnosis not present

## 2020-03-15 DIAGNOSIS — Z794 Long term (current) use of insulin: Secondary | ICD-10-CM | POA: Diagnosis not present

## 2020-03-15 DIAGNOSIS — B351 Tinea unguium: Secondary | ICD-10-CM | POA: Diagnosis not present

## 2020-03-15 LAB — CUP PACEART REMOTE DEVICE CHECK
Battery Remaining Longevity: 28 mo
Battery Voltage: 2.94 V
Brady Statistic AP VP Percent: 0.75 %
Brady Statistic AP VS Percent: 0 %
Brady Statistic AS VP Percent: 99.23 %
Brady Statistic AS VS Percent: 0.02 %
Brady Statistic RA Percent Paced: 0.74 %
Brady Statistic RV Percent Paced: 99.98 %
Date Time Interrogation Session: 20210429221502
Implantable Lead Implant Date: 20200131
Implantable Lead Implant Date: 20200131
Implantable Lead Location: 753859
Implantable Lead Location: 753860
Implantable Lead Model: 3830
Implantable Lead Model: 5076
Implantable Pulse Generator Implant Date: 20200131
Lead Channel Impedance Value: 285 Ohm
Lead Channel Impedance Value: 361 Ohm
Lead Channel Impedance Value: 437 Ohm
Lead Channel Impedance Value: 437 Ohm
Lead Channel Pacing Threshold Amplitude: 0.375 V
Lead Channel Pacing Threshold Amplitude: 1.25 V
Lead Channel Pacing Threshold Pulse Width: 0.4 ms
Lead Channel Pacing Threshold Pulse Width: 0.4 ms
Lead Channel Sensing Intrinsic Amplitude: 2.5 mV
Lead Channel Sensing Intrinsic Amplitude: 2.5 mV
Lead Channel Sensing Intrinsic Amplitude: 5.875 mV
Lead Channel Sensing Intrinsic Amplitude: 5.875 mV
Lead Channel Setting Pacing Amplitude: 1.5 V
Lead Channel Setting Pacing Amplitude: 5 V
Lead Channel Setting Pacing Pulse Width: 1 ms
Lead Channel Setting Sensing Sensitivity: 4 mV

## 2020-03-15 NOTE — Progress Notes (Signed)
Ppm Remote  

## 2020-03-27 DIAGNOSIS — E039 Hypothyroidism, unspecified: Secondary | ICD-10-CM | POA: Diagnosis not present

## 2020-03-27 DIAGNOSIS — E7800 Pure hypercholesterolemia, unspecified: Secondary | ICD-10-CM | POA: Diagnosis not present

## 2020-03-27 DIAGNOSIS — E1042 Type 1 diabetes mellitus with diabetic polyneuropathy: Secondary | ICD-10-CM | POA: Diagnosis not present

## 2020-03-27 DIAGNOSIS — Z79899 Other long term (current) drug therapy: Secondary | ICD-10-CM | POA: Diagnosis not present

## 2020-03-27 DIAGNOSIS — I10 Essential (primary) hypertension: Secondary | ICD-10-CM | POA: Diagnosis not present

## 2020-04-04 DIAGNOSIS — Z79899 Other long term (current) drug therapy: Secondary | ICD-10-CM | POA: Diagnosis not present

## 2020-04-04 DIAGNOSIS — E7800 Pure hypercholesterolemia, unspecified: Secondary | ICD-10-CM | POA: Diagnosis not present

## 2020-04-04 DIAGNOSIS — E039 Hypothyroidism, unspecified: Secondary | ICD-10-CM | POA: Diagnosis not present

## 2020-04-04 DIAGNOSIS — E1042 Type 1 diabetes mellitus with diabetic polyneuropathy: Secondary | ICD-10-CM | POA: Diagnosis not present

## 2020-04-04 DIAGNOSIS — D696 Thrombocytopenia, unspecified: Secondary | ICD-10-CM | POA: Diagnosis not present

## 2020-04-04 DIAGNOSIS — I10 Essential (primary) hypertension: Secondary | ICD-10-CM | POA: Diagnosis not present

## 2020-04-04 DIAGNOSIS — J431 Panlobular emphysema: Secondary | ICD-10-CM | POA: Diagnosis not present

## 2020-04-04 DIAGNOSIS — F3341 Major depressive disorder, recurrent, in partial remission: Secondary | ICD-10-CM | POA: Diagnosis not present

## 2020-04-04 DIAGNOSIS — Z0000 Encounter for general adult medical examination without abnormal findings: Secondary | ICD-10-CM | POA: Diagnosis not present

## 2020-04-15 DIAGNOSIS — E1043 Type 1 diabetes mellitus with diabetic autonomic (poly)neuropathy: Secondary | ICD-10-CM | POA: Diagnosis not present

## 2020-04-15 DIAGNOSIS — E1065 Type 1 diabetes mellitus with hyperglycemia: Secondary | ICD-10-CM | POA: Diagnosis not present

## 2020-04-15 DIAGNOSIS — Z794 Long term (current) use of insulin: Secondary | ICD-10-CM | POA: Diagnosis not present

## 2020-05-16 DIAGNOSIS — Z794 Long term (current) use of insulin: Secondary | ICD-10-CM | POA: Diagnosis not present

## 2020-05-16 DIAGNOSIS — E1065 Type 1 diabetes mellitus with hyperglycemia: Secondary | ICD-10-CM | POA: Diagnosis not present

## 2020-05-16 DIAGNOSIS — E1043 Type 1 diabetes mellitus with diabetic autonomic (poly)neuropathy: Secondary | ICD-10-CM | POA: Diagnosis not present

## 2020-05-17 DIAGNOSIS — F17200 Nicotine dependence, unspecified, uncomplicated: Secondary | ICD-10-CM | POA: Diagnosis not present

## 2020-05-17 DIAGNOSIS — E1065 Type 1 diabetes mellitus with hyperglycemia: Secondary | ICD-10-CM | POA: Diagnosis not present

## 2020-05-17 DIAGNOSIS — Z9641 Presence of insulin pump (external) (internal): Secondary | ICD-10-CM | POA: Diagnosis not present

## 2020-05-17 DIAGNOSIS — E1043 Type 1 diabetes mellitus with diabetic autonomic (poly)neuropathy: Secondary | ICD-10-CM | POA: Diagnosis not present

## 2020-05-17 DIAGNOSIS — E10649 Type 1 diabetes mellitus with hypoglycemia without coma: Secondary | ICD-10-CM | POA: Diagnosis not present

## 2020-05-24 DIAGNOSIS — E1065 Type 1 diabetes mellitus with hyperglycemia: Secondary | ICD-10-CM | POA: Diagnosis not present

## 2020-06-14 ENCOUNTER — Ambulatory Visit (INDEPENDENT_AMBULATORY_CARE_PROVIDER_SITE_OTHER): Payer: HMO | Admitting: *Deleted

## 2020-06-14 DIAGNOSIS — I442 Atrioventricular block, complete: Secondary | ICD-10-CM

## 2020-06-14 LAB — CUP PACEART REMOTE DEVICE CHECK
Battery Remaining Longevity: 24 mo
Battery Voltage: 2.93 V
Brady Statistic AP VP Percent: 1.09 %
Brady Statistic AP VS Percent: 0 %
Brady Statistic AS VP Percent: 98.89 %
Brady Statistic AS VS Percent: 0.03 %
Brady Statistic RA Percent Paced: 1.08 %
Brady Statistic RV Percent Paced: 99.97 %
Date Time Interrogation Session: 20210730093143
Implantable Lead Implant Date: 20200131
Implantable Lead Implant Date: 20200131
Implantable Lead Location: 753859
Implantable Lead Location: 753860
Implantable Lead Model: 3830
Implantable Lead Model: 5076
Implantable Pulse Generator Implant Date: 20200131
Lead Channel Impedance Value: 266 Ohm
Lead Channel Impedance Value: 342 Ohm
Lead Channel Impedance Value: 418 Ohm
Lead Channel Impedance Value: 437 Ohm
Lead Channel Pacing Threshold Amplitude: 0.5 V
Lead Channel Pacing Threshold Amplitude: 1.125 V
Lead Channel Pacing Threshold Pulse Width: 0.4 ms
Lead Channel Pacing Threshold Pulse Width: 0.4 ms
Lead Channel Sensing Intrinsic Amplitude: 2 mV
Lead Channel Sensing Intrinsic Amplitude: 2 mV
Lead Channel Sensing Intrinsic Amplitude: 5.875 mV
Lead Channel Sensing Intrinsic Amplitude: 5.875 mV
Lead Channel Setting Pacing Amplitude: 1.5 V
Lead Channel Setting Pacing Amplitude: 5 V
Lead Channel Setting Pacing Pulse Width: 1 ms
Lead Channel Setting Sensing Sensitivity: 4 mV

## 2020-06-16 DIAGNOSIS — E1065 Type 1 diabetes mellitus with hyperglycemia: Secondary | ICD-10-CM | POA: Diagnosis not present

## 2020-06-16 DIAGNOSIS — Z794 Long term (current) use of insulin: Secondary | ICD-10-CM | POA: Diagnosis not present

## 2020-06-16 DIAGNOSIS — E1043 Type 1 diabetes mellitus with diabetic autonomic (poly)neuropathy: Secondary | ICD-10-CM | POA: Diagnosis not present

## 2020-06-18 NOTE — Progress Notes (Signed)
Remote pacemaker transmission.   

## 2020-07-05 DIAGNOSIS — Z79899 Other long term (current) drug therapy: Secondary | ICD-10-CM | POA: Diagnosis not present

## 2020-07-05 DIAGNOSIS — I10 Essential (primary) hypertension: Secondary | ICD-10-CM | POA: Diagnosis not present

## 2020-07-05 DIAGNOSIS — E039 Hypothyroidism, unspecified: Secondary | ICD-10-CM | POA: Diagnosis not present

## 2020-07-05 DIAGNOSIS — R8290 Unspecified abnormal findings in urine: Secondary | ICD-10-CM | POA: Diagnosis not present

## 2020-07-05 DIAGNOSIS — E1042 Type 1 diabetes mellitus with diabetic polyneuropathy: Secondary | ICD-10-CM | POA: Diagnosis not present

## 2020-07-12 DIAGNOSIS — J431 Panlobular emphysema: Secondary | ICD-10-CM | POA: Diagnosis not present

## 2020-07-12 DIAGNOSIS — E039 Hypothyroidism, unspecified: Secondary | ICD-10-CM | POA: Diagnosis not present

## 2020-07-12 DIAGNOSIS — E7800 Pure hypercholesterolemia, unspecified: Secondary | ICD-10-CM | POA: Diagnosis not present

## 2020-07-12 DIAGNOSIS — E1042 Type 1 diabetes mellitus with diabetic polyneuropathy: Secondary | ICD-10-CM | POA: Diagnosis not present

## 2020-07-12 DIAGNOSIS — I10 Essential (primary) hypertension: Secondary | ICD-10-CM | POA: Diagnosis not present

## 2020-07-12 DIAGNOSIS — Z79899 Other long term (current) drug therapy: Secondary | ICD-10-CM | POA: Diagnosis not present

## 2020-07-15 DIAGNOSIS — Z9641 Presence of insulin pump (external) (internal): Secondary | ICD-10-CM | POA: Diagnosis not present

## 2020-07-15 DIAGNOSIS — E10649 Type 1 diabetes mellitus with hypoglycemia without coma: Secondary | ICD-10-CM | POA: Diagnosis not present

## 2020-07-15 DIAGNOSIS — E1065 Type 1 diabetes mellitus with hyperglycemia: Secondary | ICD-10-CM | POA: Diagnosis not present

## 2020-07-15 DIAGNOSIS — E063 Autoimmune thyroiditis: Secondary | ICD-10-CM | POA: Diagnosis not present

## 2020-07-15 DIAGNOSIS — F17200 Nicotine dependence, unspecified, uncomplicated: Secondary | ICD-10-CM | POA: Diagnosis not present

## 2020-07-15 DIAGNOSIS — E038 Other specified hypothyroidism: Secondary | ICD-10-CM | POA: Diagnosis not present

## 2020-07-15 DIAGNOSIS — E1043 Type 1 diabetes mellitus with diabetic autonomic (poly)neuropathy: Secondary | ICD-10-CM | POA: Diagnosis not present

## 2020-07-17 DIAGNOSIS — E1065 Type 1 diabetes mellitus with hyperglycemia: Secondary | ICD-10-CM | POA: Diagnosis not present

## 2020-07-17 DIAGNOSIS — E1043 Type 1 diabetes mellitus with diabetic autonomic (poly)neuropathy: Secondary | ICD-10-CM | POA: Diagnosis not present

## 2020-07-17 DIAGNOSIS — Z794 Long term (current) use of insulin: Secondary | ICD-10-CM | POA: Diagnosis not present

## 2020-08-12 DIAGNOSIS — E119 Type 2 diabetes mellitus without complications: Secondary | ICD-10-CM | POA: Diagnosis not present

## 2020-08-12 DIAGNOSIS — B351 Tinea unguium: Secondary | ICD-10-CM | POA: Diagnosis not present

## 2020-08-12 DIAGNOSIS — L600 Ingrowing nail: Secondary | ICD-10-CM | POA: Diagnosis not present

## 2020-08-12 DIAGNOSIS — M79675 Pain in left toe(s): Secondary | ICD-10-CM | POA: Diagnosis not present

## 2020-08-12 DIAGNOSIS — M79674 Pain in right toe(s): Secondary | ICD-10-CM | POA: Diagnosis not present

## 2020-08-17 DIAGNOSIS — E1065 Type 1 diabetes mellitus with hyperglycemia: Secondary | ICD-10-CM | POA: Diagnosis not present

## 2020-08-17 DIAGNOSIS — Z794 Long term (current) use of insulin: Secondary | ICD-10-CM | POA: Diagnosis not present

## 2020-08-17 DIAGNOSIS — E1043 Type 1 diabetes mellitus with diabetic autonomic (poly)neuropathy: Secondary | ICD-10-CM | POA: Diagnosis not present

## 2020-08-27 DIAGNOSIS — E1065 Type 1 diabetes mellitus with hyperglycemia: Secondary | ICD-10-CM | POA: Diagnosis not present

## 2020-09-02 ENCOUNTER — Other Ambulatory Visit: Payer: Self-pay

## 2020-09-02 NOTE — Patient Outreach (Signed)
[facility]) Care Management Chronic Special Needs Program  09/02/2020  Name: Madeline [name] DOB: Mar 19, 1934  MRN: [id number]  Ms. Nishka Heide is enrolled in a chronic special needs plan for Diabetes. Reviewed and updated care plan.  Subjective: Client states that she has been doing good.  States that she saw her endocrinologist a month or so ago.  States her blood sugars are ranging 80-180 and she scans before meals and at bedtime and sometimes more often than that.  States she is not having low blood sugars . States she is trying to watch her CHO portions and the salt in her diet.   States she is trying to walk when the weather is good.  Denies any issues with her heart or pacemaker.  States her B/P is good when it is checked.  States she is still smoking and does not wish to quit at this time.  States she has gotten both of her COVID shots and plans to get her booster soon.  States she has not gotten her Flu shot yet but plans to get when she sees her doctor again.  States that [facility] provider has visited with her and will come again later in the year.    Goals Addressed              This Visit's Progress   .  Client will report no worsening of symptoms related to heart disease within the next 6 months(continue 09/02/20   On track     Reports no issues with heart disease Reinforced signs and symptoms to call doctor and when to call 911 Follow a low salt diet      .  Client will verbalize knowledge of self management of Hypertension as evidences by BP reading of 140/90 or less; or as defined by provider   On track     Reports B/P less than 140/90 when checking Plan to check B/P regularly Take B/P medications as ordered Plan to follow a low salt diet  Increase activity as tolerated Reinforced importance of  smoking cessation     .  Decrease inpatient admissions/ readmissions with in the next year   On track     No admissions     .  COMPLETED: Diabetes  Patient stated goal to go to see eye doctor in the next 6 months (pt-stated)   On track     Reports completed eye exam last month Goal completed 09/02/20    .  HEMOGLOBIN A1C < 7 (pt-stated)        Last Hemoglobin A1C 8.4% on 07/05/20 Reinforced fasting blood sugar goals of 80-130 and less than 180 1 1/2-2 hours after meals Reinforced to follow a low carbohydrate low salt diet and to watch portion sizes Reinforced to give insulin bolus before eating Reviewed signs and symptoms of hypoglycemia and actions to take Plan to get annual flu shot and COVID booster     .  COMPLETED: Obtain annual  Lipid Profile, LDL-C   On track     Last completed 03/27/20 LDL 53 The goal for LDL is less than 70 mg/dL as you are at high risk for complications Try to avoid saturated fats, trans-fats and eat more fiber Plan to take statin as ordered Goal completed 09/02/20    .  COMPLETED: Obtain Annual Eye (retinal)  Exam    On track     Remote exam completed at provider office on 12/28/19 Plan to have a dilated eye exam  every year Goal completed 09/02/20    .  COMPLETED: Obtain Annual Foot Exam   On track     Completed 05/17/20 Check your skin and feet every day for cuts, bruises, redness, blisters, or sores. Report to provider any problems with your feet Schedule a foot exam with your health care provider once every year Goal completed 09/02/20    .  Obtain annual screen for micro albuminuria (urine) , nephropathy (kidney problems)   On track     Last completed 12/15/18 It is important for your doctor to check your urine for protein at least every year    .  COMPLETED: Obtain Hemoglobin A1C at least 2 times per year   On track     Completed 01/30/20, 03/27/20, 07/05/20 It is important to have your Hemoglobin A1C checked every 6 months if you are at goal and every 3 months if you are not at goal Goal completed 09/02/20    .  Quit Smoking   Not on track     Encouraged to cut back or quit smoking  Client  again  declined referral to [facility] Coach for smoking cessation and diet counseling     .  COMPLETED: Visit Primary Care Provider or Endocrinologist at least 2 times per year    On track     Primary care provider 12/28/19, 04/04/20, 07/12/20 Annual wellness visit 04/04/20 Endocrinologist 02/09/20, 05/17/20, 07/15/20 [facility] provider 05/02/20, next visit scheduled 10/28/20 Goal completed 09/02/20       Plan:  Send successful outreach letter with a copy of their individualized care plan and Send individual care plan to provider  Client will be outreached by a [facility] RNCM in 6 months per tier level [facility] Case Management Team will follow member moving forward with assessments, care plans and documentation   Dudley Major RN, BSN,CCM, CDE Chronic Care Management Coordinator [facility] Care Management [phone]

## 2020-09-13 ENCOUNTER — Ambulatory Visit (INDEPENDENT_AMBULATORY_CARE_PROVIDER_SITE_OTHER): Payer: HMO

## 2020-09-13 DIAGNOSIS — I442 Atrioventricular block, complete: Secondary | ICD-10-CM

## 2020-09-13 LAB — CUP PACEART REMOTE DEVICE CHECK
Battery Remaining Longevity: 21 mo
Battery Voltage: 2.92 V
Brady Statistic AP VP Percent: 2.13 %
Brady Statistic AP VS Percent: 0 %
Brady Statistic AS VP Percent: 97.82 %
Brady Statistic AS VS Percent: 0.05 %
Brady Statistic RA Percent Paced: 2.1 %
Brady Statistic RV Percent Paced: 99.95 %
Date Time Interrogation Session: 20211029045956
Implantable Lead Implant Date: 20200131
Implantable Lead Implant Date: 20200131
Implantable Lead Location: 753859
Implantable Lead Location: 753860
Implantable Lead Model: 3830
Implantable Lead Model: 5076
Implantable Pulse Generator Implant Date: 20200131
Lead Channel Impedance Value: 285 Ohm
Lead Channel Impedance Value: 361 Ohm
Lead Channel Impedance Value: 418 Ohm
Lead Channel Impedance Value: 475 Ohm
Lead Channel Pacing Threshold Amplitude: 0.5 V
Lead Channel Pacing Threshold Amplitude: 1.25 V
Lead Channel Pacing Threshold Pulse Width: 0.4 ms
Lead Channel Pacing Threshold Pulse Width: 0.4 ms
Lead Channel Sensing Intrinsic Amplitude: 2.625 mV
Lead Channel Sensing Intrinsic Amplitude: 2.625 mV
Lead Channel Sensing Intrinsic Amplitude: 5.875 mV
Lead Channel Sensing Intrinsic Amplitude: 5.875 mV
Lead Channel Setting Pacing Amplitude: 1.5 V
Lead Channel Setting Pacing Amplitude: 5 V
Lead Channel Setting Pacing Pulse Width: 1 ms
Lead Channel Setting Sensing Sensitivity: 4 mV

## 2020-09-17 DIAGNOSIS — Z794 Long term (current) use of insulin: Secondary | ICD-10-CM | POA: Diagnosis not present

## 2020-09-17 DIAGNOSIS — E1065 Type 1 diabetes mellitus with hyperglycemia: Secondary | ICD-10-CM | POA: Diagnosis not present

## 2020-09-17 DIAGNOSIS — E1043 Type 1 diabetes mellitus with diabetic autonomic (poly)neuropathy: Secondary | ICD-10-CM | POA: Diagnosis not present

## 2020-09-17 NOTE — Progress Notes (Signed)
Remote pacemaker transmission.   

## 2020-10-09 DIAGNOSIS — E7800 Pure hypercholesterolemia, unspecified: Secondary | ICD-10-CM | POA: Diagnosis not present

## 2020-10-09 DIAGNOSIS — I10 Essential (primary) hypertension: Secondary | ICD-10-CM | POA: Diagnosis not present

## 2020-10-09 DIAGNOSIS — E1042 Type 1 diabetes mellitus with diabetic polyneuropathy: Secondary | ICD-10-CM | POA: Diagnosis not present

## 2020-10-09 DIAGNOSIS — Z79899 Other long term (current) drug therapy: Secondary | ICD-10-CM | POA: Diagnosis not present

## 2020-10-09 DIAGNOSIS — E039 Hypothyroidism, unspecified: Secondary | ICD-10-CM | POA: Diagnosis not present

## 2020-10-16 ENCOUNTER — Other Ambulatory Visit: Payer: Self-pay | Admitting: Internal Medicine

## 2020-10-16 DIAGNOSIS — J431 Panlobular emphysema: Secondary | ICD-10-CM | POA: Diagnosis not present

## 2020-10-16 DIAGNOSIS — E1042 Type 1 diabetes mellitus with diabetic polyneuropathy: Secondary | ICD-10-CM | POA: Diagnosis not present

## 2020-10-16 DIAGNOSIS — E039 Hypothyroidism, unspecified: Secondary | ICD-10-CM | POA: Diagnosis not present

## 2020-10-16 DIAGNOSIS — I10 Essential (primary) hypertension: Secondary | ICD-10-CM | POA: Diagnosis not present

## 2020-10-16 DIAGNOSIS — Z1231 Encounter for screening mammogram for malignant neoplasm of breast: Secondary | ICD-10-CM | POA: Diagnosis not present

## 2020-10-16 DIAGNOSIS — D696 Thrombocytopenia, unspecified: Secondary | ICD-10-CM | POA: Diagnosis not present

## 2020-10-16 DIAGNOSIS — Z79899 Other long term (current) drug therapy: Secondary | ICD-10-CM | POA: Diagnosis not present

## 2020-10-16 DIAGNOSIS — E7800 Pure hypercholesterolemia, unspecified: Secondary | ICD-10-CM | POA: Diagnosis not present

## 2020-10-18 DIAGNOSIS — Z794 Long term (current) use of insulin: Secondary | ICD-10-CM | POA: Diagnosis not present

## 2020-10-18 DIAGNOSIS — E1043 Type 1 diabetes mellitus with diabetic autonomic (poly)neuropathy: Secondary | ICD-10-CM | POA: Diagnosis not present

## 2020-10-18 DIAGNOSIS — E1065 Type 1 diabetes mellitus with hyperglycemia: Secondary | ICD-10-CM | POA: Diagnosis not present

## 2020-10-21 DIAGNOSIS — Z9641 Presence of insulin pump (external) (internal): Secondary | ICD-10-CM | POA: Diagnosis not present

## 2020-10-21 DIAGNOSIS — F17200 Nicotine dependence, unspecified, uncomplicated: Secondary | ICD-10-CM | POA: Diagnosis not present

## 2020-10-21 DIAGNOSIS — E1043 Type 1 diabetes mellitus with diabetic autonomic (poly)neuropathy: Secondary | ICD-10-CM | POA: Diagnosis not present

## 2020-10-21 DIAGNOSIS — E1065 Type 1 diabetes mellitus with hyperglycemia: Secondary | ICD-10-CM | POA: Diagnosis not present

## 2020-10-21 DIAGNOSIS — E10649 Type 1 diabetes mellitus with hypoglycemia without coma: Secondary | ICD-10-CM | POA: Diagnosis not present

## 2020-10-21 DIAGNOSIS — I959 Hypotension, unspecified: Secondary | ICD-10-CM | POA: Diagnosis not present

## 2020-10-23 DIAGNOSIS — F17200 Nicotine dependence, unspecified, uncomplicated: Secondary | ICD-10-CM | POA: Diagnosis not present

## 2020-10-23 DIAGNOSIS — Z9641 Presence of insulin pump (external) (internal): Secondary | ICD-10-CM | POA: Diagnosis not present

## 2020-10-23 DIAGNOSIS — E10649 Type 1 diabetes mellitus with hypoglycemia without coma: Secondary | ICD-10-CM | POA: Diagnosis not present

## 2020-10-23 DIAGNOSIS — E1065 Type 1 diabetes mellitus with hyperglycemia: Secondary | ICD-10-CM | POA: Diagnosis not present

## 2020-10-23 DIAGNOSIS — E1043 Type 1 diabetes mellitus with diabetic autonomic (poly)neuropathy: Secondary | ICD-10-CM | POA: Diagnosis not present

## 2020-10-28 ENCOUNTER — Other Ambulatory Visit: Payer: Self-pay | Admitting: Internal Medicine

## 2020-10-28 ENCOUNTER — Other Ambulatory Visit: Payer: Self-pay

## 2020-10-28 NOTE — Patient Outreach (Signed)
  [facility]) Care Management Chronic Special Needs Program    10/28/2020  Name: Madeline [name], DOB: 1934-04-01  MRN: [id number]   Madeline [name] is enrolled in a chronic special needs plan for Diabetes.  [facility] Care Management will continue to provide services for this client through 11/15/2020. The [facility] care management team will assume care 11/16/2020.  Dudley Major RN, Maximiano Coss, CDE Chronic Care Management Coordinator [facility] Care Management [phone]

## 2020-11-04 ENCOUNTER — Other Ambulatory Visit: Payer: Self-pay | Admitting: Internal Medicine

## 2020-11-18 DIAGNOSIS — Z794 Long term (current) use of insulin: Secondary | ICD-10-CM | POA: Diagnosis not present

## 2020-11-18 DIAGNOSIS — E1043 Type 1 diabetes mellitus with diabetic autonomic (poly)neuropathy: Secondary | ICD-10-CM | POA: Diagnosis not present

## 2020-11-18 DIAGNOSIS — E1065 Type 1 diabetes mellitus with hyperglycemia: Secondary | ICD-10-CM | POA: Diagnosis not present

## 2020-11-19 ENCOUNTER — Other Ambulatory Visit: Payer: Self-pay

## 2020-12-02 ENCOUNTER — Other Ambulatory Visit: Payer: Self-pay | Admitting: Internal Medicine

## 2020-12-05 NOTE — Progress Notes (Signed)
Cardiology Office Note   Date:  12/09/2020   ID:  Madeline [name], DOB 07-Nov-1934, MRN [id number]  PCP:  Madeline Arbour, MD  Cardiologist:   Madeline Pates, MD   F/U of CAD   History of Present Illness: AILY [name] is a 87 y.o. female with a history of CAD, s/p inferior STEMI om 2012  She had occlusion of the RCA and underwent PTCA with DES to theprox RCA. It was a right dominant system with dual PDAs. Other vessels had irregularities. She was d/cd home on Brilinta. The pt also has a history of bradycardia and is s/p PPM  I last saw the pt in Sept 2020  Since seen she denies CP   Breathing is OK   Still smoking about 1ppd Has not hadCOVID   Got vaccinated   Outpatient Medications Prior to Visit  Medication Sig Dispense Refill  . acetaminophen (TYLENOL) 325 MG tablet Take 1-2 tablets (325-650 mg total) by mouth every 6 (six) hours as needed for mild pain or moderate pain.    Marland Kitchen atorvastatin (LIPITOR) 20 MG tablet Take 1 tablet (20 mg total) by mouth daily at 6 PM. Please make overdue appt with Dr. Tenny Craw before anymore refills. Thank you 1st attempt 30 tablet 0  . cetirizine (ZYRTEC) 10 MG tablet Take 10 mg by mouth daily.    . citalopram (CELEXA) 20 MG tablet Take 20 mg by mouth daily.    . CONTOUR NEXT TEST test strip USE 1 STRIP TO CHECK BLOOD SUGAR EIGHT TO NINE TIMES DAILY.    Marland Kitchen Insulin Human (INSULIN PUMP) SOLN Inject 1 each into the skin continuous. Used with Novolog insulin    . Lancets 28G MISC Use as instructed. 7-8 times daily.    Marland Kitchen levothyroxine (SYNTHROID, LEVOTHROID) 50 MCG tablet Take 50 mcg by mouth daily before breakfast.   3  . Multiple Vitamin (MULTIVITAMIN WITH MINERALS) TABS tablet Take 1 tablet by mouth daily.    Marland Kitchen NOVOLOG 100 UNIT/ML injection Inject 0-50 Units into the skin continuous. Up to 50 units/ day via pump    . lisinopril (PRINIVIL,ZESTRIL) 20 MG tablet Take 1 tablet (20 mg total) by mouth daily. 30 tablet 1   No facility-administered medications  prior to visit.     Allergies:   Darvon, Propoxyphene, and Sulfa antibiotics   Past Medical History:  Diagnosis Date  . Bradycardia   . Coronary artery disease   . Diabetes mellitus   . Dyslipidemia   . GERD (gastroesophageal reflux disease)   . Hypothyroid   . Presence of permanent cardiac pacemaker 12/16/2018   Medtronic dual lead pacemaker    Past Surgical History:  Procedure Laterality Date  . APPENDECTOMY    . BREAST BIOPSY Right 1967   EXCISIONAL - NEG  . CHOLECYSTECTOMY    . CORONARY STENT INTERVENTION    . INSERT / REPLACE / REMOVE PACEMAKER  12/16/2018  . no surgical hx    . PACEMAKER IMPLANT N/A 12/16/2018   Procedure: PACEMAKER IMPLANT;  Surgeon: Madeline Maw, MD;  Location: [facility] INVASIVE CV LAB;  Service: Cardiovascular;  Laterality: N/A;     Social History:  The patient  reports that she has been smoking cigarettes. She has a 50.00 pack-year smoking history. She has never used smokeless tobacco. She reports that she does not drink alcohol and does not use drugs.   Family History:  The patient's family history includes Breast cancer (age of onset: 72) in her sister; Breast  cancer (age of onset: 28) in her sister; Cancer in her sister, sister, and sister; Diabetes in her son; Heart Problems in her mother; Other in her father.    ROS:  Please see the history of present illness. All other systems are reviewed and  Negative to the above problem except as noted.    PHYSICAL EXAM: VS:  BP 126/84   Pulse 79   Ht 5\' 2"  (1.575 m)   Wt 113 lb 6.4 oz (51.4 kg)   BMI 20.74 kg/m   GEN: Thin 87 yo in no acute distress  HEENT: normal  Neck: No JVD    Nocarotid bruits, Cardiac: RRR; Gr III/VI systolic m LSB      No LE edema   Respiratory:  clear to auscultation bilaterally, GI: soft, nontender, nondistended, + BS  No hepatomegaly  MS: no deformity Moving all extremities   Skin: warm and dry, no rash Neuro:  Strength and sensation are intact Psych: euthymic mood,  full affect   EKG:  EKG ordered today   SR with PACs Ventricular   Paced    Lipid Panel    Component Value Date/Time   CHOL 134 08/07/2019 0857   TRIG 51 08/07/2019 0857   HDL 71 08/07/2019 0857   CHOLHDL 1.9 08/07/2019 0857   CHOLHDL 1.7 08/03/2016 1519   VLDL 19 08/03/2016 1519   LDLCALC 51 08/07/2019 0857      Wt Readings from Last 3 Encounters:  12/09/20 113 lb 6.4 oz (51.4 kg)  01/31/20 114 lb (51.7 kg)  08/07/19 114 lb 9.6 oz (52 kg)      ASSESSMENT AND PLAN:  1  CAD  Remote intervention   Remains symptom free    2  AS  MIld on echo in 2019   Exam sugg it has not changed signif   WIll get echo next fall  3  HL   LDL in NOv was 57  HDL 71  Continue lipitor    4  Hx bradycardia   S/p PPM     5.  Tob   Counselled again on cessation  She continues to smoke a ppd     6  HTN  Good control  7  DM  A1C 8.3 in Nov  Discussed diet       Current medicines are reviewed at length with the patient today.  The patient does not have concerns regarding medicines. Signed, Dec, MD  12/09/2020 8:51 AM    [facility] [address] [place], [place], [place]  [place] Phone: [phone]; Fax: [phone]

## 2020-12-09 ENCOUNTER — Encounter: Payer: Self-pay | Admitting: Internal Medicine

## 2020-12-09 ENCOUNTER — Other Ambulatory Visit: Payer: Self-pay

## 2020-12-09 ENCOUNTER — Ambulatory Visit (INDEPENDENT_AMBULATORY_CARE_PROVIDER_SITE_OTHER): Payer: HMO | Admitting: Internal Medicine

## 2020-12-09 VITALS — BP 126/84 | HR 79 | Ht 62.0 in | Wt 113.4 lb

## 2020-12-09 DIAGNOSIS — I2510 Atherosclerotic heart disease of native coronary artery without angina pectoris: Secondary | ICD-10-CM

## 2020-12-09 MED ORDER — ATORVASTATIN CALCIUM 20 MG PO TABS: 20.0000 mg | ORAL_TABLET | Freq: Every day | ORAL | 3 refills | Status: DC

## 2020-12-09 NOTE — Patient Instructions (Signed)
Medication Instructions:  No changes *If you need a refill on your cardiac medications before your next appointment, please call your pharmacy*   Lab Work: none If you have labs (blood work) drawn today and your tests are completely normal, you will receive your results only by: Marland Kitchen MyChart Message (if you have MyChart) OR . A paper copy in the mail If you have any lab test that is abnormal or we need to change your treatment, we will call you to review the results.   Testing/Procedures: (PRIOR TO NEXT VISIT W DR. Tenny Craw) Your physician has requested that you have an echocardiogram. Echocardiography is a painless test that uses sound waves to create images of your heart. It provides your doctor with information about the size and shape of your heart and how well your heart's chambers and valves are working. This procedure takes approximately one hour. There are no restrictions for this procedure.    Follow-Up: At [facility], you and your health needs are our priority.  As part of our continuing mission to provide you with exceptional heart care, we have created designated Provider Care Teams.  These Care Teams include your primary Cardiologist (physician) and Advanced Practice Providers (APPs -  Physician Assistants and Nurse Practitioners) who all work together to provide you with the care you need, when you need it.  We recommend signing up for the patient portal called "MyChart".  Sign up information is provided on this After Visit Summary.  MyChart is used to connect with patients for Virtual Visits (Telemedicine).  Patients are able to view lab/test results, encounter notes, upcoming appointments, etc.  Non-urgent messages can be sent to your provider as well.   To learn more about what you can do with MyChart, go to [email or web address].    Your next appointment:   9 month(s)  The format for your next appointment:   In Person  Provider:   You may see Dietrich Pates, MD or one of  the following Advanced Practice Providers on your designated Care Team:    Tereso Newcomer, PA-C  Chelsea Aus, [place]    Other Instructions

## 2020-12-13 ENCOUNTER — Ambulatory Visit (INDEPENDENT_AMBULATORY_CARE_PROVIDER_SITE_OTHER): Payer: HMO

## 2020-12-13 DIAGNOSIS — I442 Atrioventricular block, complete: Secondary | ICD-10-CM | POA: Diagnosis not present

## 2020-12-13 LAB — CUP PACEART REMOTE DEVICE CHECK
Battery Remaining Longevity: 16 mo
Battery Voltage: 2.9 V
Brady Statistic AP VP Percent: 1.7 %
Brady Statistic AP VS Percent: 0 %
Brady Statistic AS VP Percent: 98.25 %
Brady Statistic AS VS Percent: 0.04 %
Brady Statistic RA Percent Paced: 1.67 %
Brady Statistic RV Percent Paced: 99.96 %
Date Time Interrogation Session: 20220127210945
Implantable Lead Implant Date: 20200131
Implantable Lead Implant Date: 20200131
Implantable Lead Location: 753859
Implantable Lead Location: 753860
Implantable Lead Model: 3830
Implantable Lead Model: 5076
Implantable Pulse Generator Implant Date: 20200131
Lead Channel Impedance Value: 285 Ohm
Lead Channel Impedance Value: 342 Ohm
Lead Channel Impedance Value: 418 Ohm
Lead Channel Impedance Value: 456 Ohm
Lead Channel Pacing Threshold Amplitude: 0.5 V
Lead Channel Pacing Threshold Amplitude: 1.125 V
Lead Channel Pacing Threshold Pulse Width: 0.4 ms
Lead Channel Pacing Threshold Pulse Width: 0.4 ms
Lead Channel Sensing Intrinsic Amplitude: 2.375 mV
Lead Channel Sensing Intrinsic Amplitude: 2.375 mV
Lead Channel Sensing Intrinsic Amplitude: 5.875 mV
Lead Channel Sensing Intrinsic Amplitude: 5.875 mV
Lead Channel Setting Pacing Amplitude: 1.5 V
Lead Channel Setting Pacing Amplitude: 5 V
Lead Channel Setting Pacing Pulse Width: 1 ms
Lead Channel Setting Sensing Sensitivity: 4 mV

## 2020-12-19 ENCOUNTER — Encounter: Payer: HMO | Admitting: Internal Medicine

## 2020-12-19 DIAGNOSIS — Z794 Long term (current) use of insulin: Secondary | ICD-10-CM | POA: Diagnosis not present

## 2020-12-19 DIAGNOSIS — E1043 Type 1 diabetes mellitus with diabetic autonomic (poly)neuropathy: Secondary | ICD-10-CM | POA: Diagnosis not present

## 2020-12-19 DIAGNOSIS — E1065 Type 1 diabetes mellitus with hyperglycemia: Secondary | ICD-10-CM | POA: Diagnosis not present

## 2020-12-21 NOTE — Progress Notes (Signed)
Remote pacemaker transmission.   

## 2020-12-23 ENCOUNTER — Ambulatory Visit: Payer: HMO | Admitting: Internal Medicine

## 2020-12-23 ENCOUNTER — Encounter: Payer: Self-pay | Admitting: Internal Medicine

## 2020-12-23 ENCOUNTER — Other Ambulatory Visit: Payer: Self-pay

## 2020-12-23 DIAGNOSIS — I442 Atrioventricular block, complete: Secondary | ICD-10-CM | POA: Diagnosis not present

## 2020-12-23 NOTE — Progress Notes (Signed)
HPI Ms. Wainright returns today for ongoing PPM followup secondary to CHB. She is a pleasant 87 yo woman with heart block, CAD, dyslipidemia and underwent PPM insertion over a year ago. She has done well in the interim. No chest pain, sob or edema. She admits to being fairly sedentary. Allergies  Allergen Reactions  . Darvon     "FEEL SICK"  . Propoxyphene Nausea Only and Nausea And Vomiting  . Sulfa Antibiotics Nausea Only and Nausea And Vomiting    "FEEL SICK"      Current Outpatient Medications  Medication Sig Dispense Refill  . acetaminophen (TYLENOL) 325 MG tablet Take 1-2 tablets (325-650 mg total) by mouth every 6 (six) hours as needed for mild pain or moderate pain.    Marland Kitchen atorvastatin (LIPITOR) 20 MG tablet Take 1 tablet (20 mg total) by mouth daily at 6 PM. 90 tablet 3  . cetirizine (ZYRTEC) 10 MG tablet Take 10 mg by mouth daily.    . citalopram (CELEXA) 20 MG tablet Take 20 mg by mouth daily.    . CONTOUR NEXT TEST test strip USE 1 STRIP TO CHECK BLOOD SUGAR EIGHT TO NINE TIMES DAILY.    Marland Kitchen Insulin Human (INSULIN PUMP) SOLN Inject 1 each into the skin continuous. Used with Novolog insulin    . Lancets 28G MISC Use as instructed. 7-8 times daily.    Marland Kitchen levothyroxine (SYNTHROID, LEVOTHROID) 50 MCG tablet Take 50 mcg by mouth daily before breakfast.   3  . Multiple Vitamin (MULTIVITAMIN WITH MINERALS) TABS tablet Take 1 tablet by mouth daily.    Marland Kitchen NOVOLOG 100 UNIT/ML injection Inject 0-50 Units into the skin continuous. Up to 50 units/ day via pump    . lisinopril (PRINIVIL,ZESTRIL) 20 MG tablet Take 1 tablet (20 mg total) by mouth daily. 30 tablet 1   No current facility-administered medications for this visit.     Past Medical History:  Diagnosis Date  . Bradycardia   . Coronary artery disease   . Diabetes mellitus   . Dyslipidemia   . GERD (gastroesophageal reflux disease)   . Hypothyroid   . Presence of permanent cardiac pacemaker 12/16/2018   Medtronic dual  lead pacemaker    ROS:   All systems reviewed and negative except as noted in the HPI.   Past Surgical History:  Procedure Laterality Date  . APPENDECTOMY    . BREAST BIOPSY Right 1967   EXCISIONAL - NEG  . CHOLECYSTECTOMY    . CORONARY STENT INTERVENTION    . INSERT / REPLACE / REMOVE PACEMAKER  12/16/2018  . no surgical hx    . PACEMAKER IMPLANT N/A 12/16/2018   Procedure: PACEMAKER IMPLANT;  Surgeon: Marinus Maw, MD;  Location: [facility] INVASIVE CV LAB;  Service: Cardiovascular;  Laterality: N/A;     Family History  Problem Relation Age of Onset  . Heart Problems Mother   . Other Father        UNK  . Cancer Sister        BREAST  . Breast cancer Sister 37  . Cancer Sister        BREAST  . Breast cancer Sister 63  . Cancer Sister        BREAST  . Diabetes Son      Social History   Socioeconomic History  . Marital status: Married    Spouse name: Not on file  . Number of children: Not on file  . Years of education:  Not on file  . Highest education level: Not on file  Occupational History  . Not on file  Tobacco Use  . Smoking status: Current Every Day Smoker    Packs/day: 1.00    Years: 50.00    Pack years: 50.00    Types: Cigarettes  . Smokeless tobacco: Never Used  Vaping Use  . Vaping Use: Never used  Substance and Sexual Activity  . Alcohol use: No  . Drug use: No  . Sexual activity: Not on file  Other Topics Concern  . Not on file  Social History Narrative  . Not on file   Social Determinants of Health   Financial Resource Strain: Not on file  Food Insecurity: No Food Insecurity  . Worried About Programme researcher, broadcasting/film/video in the Last Year: Never true  . Ran Out of Food in the Last Year: Never true  Transportation Needs: No Transportation Needs  . Lack of Transportation (Medical): No  . Lack of Transportation (Non-Medical): No  Physical Activity: Not on file  Stress: Not on file  Social Connections: Not on file  Intimate Partner Violence: Not on  file     BP 126/60   Pulse 75   Ht 5\' 2"  (1.575 m)   Wt 113 lb 3.2 oz (51.3 kg)   SpO2 95%   BMI 20.70 kg/m   Physical Exam:  frail appearing elderly woman, NAD HEENT: Unremarkable Neck:  No JVD, no thyromegally Lymphatics:  No adenopathy Back:  No CVA tenderness Lungs:  Clear with no wheezes HEART:  Regular rate rhythm, no murmurs, no rubs, no clicks Abd:  soft, positive bowel sounds, no organomegally, no rebound, no guarding Ext:  2 plus pulses, no edema, no cyanosis, no clubbing Skin:  No rashes no nodules Neuro:  CN II through XII intact, motor grossly intact  DEVICE  Normal device function.  See PaceArt for details.   Assess/Plan: 1. High grade AV block - the patient has undergone PPM insertion and is asymptomatic and is without an escape rhythm today.  2. CAD - she denies angina but she is not taking any AV nodal blocking drugs. 3. Dyslipidemia - she will continue her statin therapy. 4. PPM - She is mostly maintaining NSR. She is encouraged to continue her systemic anti-coagulation.  Jaylia Pettus,MD

## 2020-12-23 NOTE — Patient Instructions (Signed)

## 2020-12-27 ENCOUNTER — Other Ambulatory Visit: Payer: Self-pay

## 2020-12-27 ENCOUNTER — Ambulatory Visit
Admission: RE | Admit: 2020-12-27 | Discharge: 2020-12-27 | Disposition: A | Discharge status: Home / Self Care | Payer: HMO | Source: Ambulatory Visit | Attending: Internal Medicine | Admitting: Internal Medicine

## 2020-12-27 DIAGNOSIS — Z1231 Encounter for screening mammogram for malignant neoplasm of breast: Secondary | ICD-10-CM | POA: Diagnosis not present

## 2020-12-27 LAB — CUP PACEART INCLINIC DEVICE CHECK
Battery Remaining Longevity: 17 mo
Battery Voltage: 2.9 V
Brady Statistic AP VP Percent: 1.25 %
Brady Statistic AP VS Percent: 0 %
Brady Statistic AS VP Percent: 98.72 %
Brady Statistic AS VS Percent: 0.03 %
Brady Statistic RA Percent Paced: 1.23 %
Brady Statistic RV Percent Paced: 99.97 %
Date Time Interrogation Session: 20220207154500
Implantable Lead Implant Date: 20200131
Implantable Lead Implant Date: 20200131
Implantable Lead Location: 753859
Implantable Lead Location: 753860
Implantable Lead Model: 3830
Implantable Lead Model: 5076
Implantable Pulse Generator Implant Date: 20200131
Lead Channel Impedance Value: 304 Ohm
Lead Channel Impedance Value: 380 Ohm
Lead Channel Impedance Value: 437 Ohm
Lead Channel Impedance Value: 475 Ohm
Lead Channel Pacing Threshold Amplitude: 0.5 V
Lead Channel Pacing Threshold Amplitude: 1 V
Lead Channel Pacing Threshold Pulse Width: 0.4 ms
Lead Channel Pacing Threshold Pulse Width: 0.5 ms
Lead Channel Sensing Intrinsic Amplitude: 2.375 mV
Lead Channel Sensing Intrinsic Amplitude: 3 mV
Lead Channel Sensing Intrinsic Amplitude: 5.875 mV
Lead Channel Sensing Intrinsic Amplitude: 5.875 mV
Lead Channel Setting Pacing Amplitude: 1.5 V
Lead Channel Setting Pacing Amplitude: 2.5 V
Lead Channel Setting Pacing Pulse Width: 0.5 ms
Lead Channel Setting Sensing Sensitivity: 4 mV

## 2021-01-08 DIAGNOSIS — E1065 Type 1 diabetes mellitus with hyperglycemia: Secondary | ICD-10-CM | POA: Diagnosis not present

## 2021-01-09 DIAGNOSIS — M79674 Pain in right toe(s): Secondary | ICD-10-CM | POA: Diagnosis not present

## 2021-01-09 DIAGNOSIS — B351 Tinea unguium: Secondary | ICD-10-CM | POA: Diagnosis not present

## 2021-01-09 DIAGNOSIS — M79675 Pain in left toe(s): Secondary | ICD-10-CM | POA: Diagnosis not present

## 2021-01-19 DIAGNOSIS — Z794 Long term (current) use of insulin: Secondary | ICD-10-CM | POA: Diagnosis not present

## 2021-01-19 DIAGNOSIS — E1043 Type 1 diabetes mellitus with diabetic autonomic (poly)neuropathy: Secondary | ICD-10-CM | POA: Diagnosis not present

## 2021-01-19 DIAGNOSIS — E1065 Type 1 diabetes mellitus with hyperglycemia: Secondary | ICD-10-CM | POA: Diagnosis not present

## 2021-02-17 DIAGNOSIS — E039 Hypothyroidism, unspecified: Secondary | ICD-10-CM | POA: Diagnosis not present

## 2021-02-17 DIAGNOSIS — Z79899 Other long term (current) drug therapy: Secondary | ICD-10-CM | POA: Diagnosis not present

## 2021-02-17 DIAGNOSIS — E1042 Type 1 diabetes mellitus with diabetic polyneuropathy: Secondary | ICD-10-CM | POA: Diagnosis not present

## 2021-02-19 DIAGNOSIS — E1065 Type 1 diabetes mellitus with hyperglycemia: Secondary | ICD-10-CM | POA: Diagnosis not present

## 2021-02-19 DIAGNOSIS — E1043 Type 1 diabetes mellitus with diabetic autonomic (poly)neuropathy: Secondary | ICD-10-CM | POA: Diagnosis not present

## 2021-02-19 DIAGNOSIS — Z794 Long term (current) use of insulin: Secondary | ICD-10-CM | POA: Diagnosis not present

## 2021-02-24 DIAGNOSIS — E039 Hypothyroidism, unspecified: Secondary | ICD-10-CM | POA: Diagnosis not present

## 2021-02-24 DIAGNOSIS — D696 Thrombocytopenia, unspecified: Secondary | ICD-10-CM | POA: Diagnosis not present

## 2021-02-24 DIAGNOSIS — J449 Chronic obstructive pulmonary disease, unspecified: Secondary | ICD-10-CM | POA: Diagnosis not present

## 2021-02-24 DIAGNOSIS — E1043 Type 1 diabetes mellitus with diabetic autonomic (poly)neuropathy: Secondary | ICD-10-CM | POA: Diagnosis not present

## 2021-02-24 DIAGNOSIS — I10 Essential (primary) hypertension: Secondary | ICD-10-CM | POA: Diagnosis not present

## 2021-02-24 DIAGNOSIS — Z79899 Other long term (current) drug therapy: Secondary | ICD-10-CM | POA: Diagnosis not present

## 2021-02-24 DIAGNOSIS — Z0000 Encounter for general adult medical examination without abnormal findings: Secondary | ICD-10-CM | POA: Diagnosis not present

## 2021-02-24 DIAGNOSIS — E7800 Pure hypercholesterolemia, unspecified: Secondary | ICD-10-CM | POA: Diagnosis not present

## 2021-02-24 DIAGNOSIS — E1010 Type 1 diabetes mellitus with ketoacidosis without coma: Secondary | ICD-10-CM | POA: Diagnosis not present

## 2021-02-25 DIAGNOSIS — E10649 Type 1 diabetes mellitus with hypoglycemia without coma: Secondary | ICD-10-CM | POA: Diagnosis not present

## 2021-02-25 DIAGNOSIS — E1043 Type 1 diabetes mellitus with diabetic autonomic (poly)neuropathy: Secondary | ICD-10-CM | POA: Diagnosis not present

## 2021-02-25 DIAGNOSIS — F17210 Nicotine dependence, cigarettes, uncomplicated: Secondary | ICD-10-CM | POA: Diagnosis not present

## 2021-02-25 DIAGNOSIS — Z9641 Presence of insulin pump (external) (internal): Secondary | ICD-10-CM | POA: Diagnosis not present

## 2021-03-14 ENCOUNTER — Ambulatory Visit (INDEPENDENT_AMBULATORY_CARE_PROVIDER_SITE_OTHER): Payer: HMO

## 2021-03-14 DIAGNOSIS — I442 Atrioventricular block, complete: Secondary | ICD-10-CM

## 2021-03-14 LAB — CUP PACEART REMOTE DEVICE CHECK
Battery Remaining Longevity: 45 mo
Battery Voltage: 2.95 V
Brady Statistic AP VP Percent: 0.67 %
Brady Statistic AP VS Percent: 0 %
Brady Statistic AS VP Percent: 99.14 %
Brady Statistic AS VS Percent: 0.19 %
Brady Statistic RA Percent Paced: 0.68 %
Brady Statistic RV Percent Paced: 99.81 %
Date Time Interrogation Session: 20220428231010
Implantable Lead Implant Date: 20200131
Implantable Lead Implant Date: 20200131
Implantable Lead Location: 753859
Implantable Lead Location: 753860
Implantable Lead Model: 3830
Implantable Lead Model: 5076
Implantable Pulse Generator Implant Date: 20200131
Lead Channel Impedance Value: 285 Ohm
Lead Channel Impedance Value: 361 Ohm
Lead Channel Impedance Value: 437 Ohm
Lead Channel Impedance Value: 475 Ohm
Lead Channel Pacing Threshold Amplitude: 0.375 V
Lead Channel Pacing Threshold Amplitude: 1.625 V
Lead Channel Pacing Threshold Pulse Width: 0.4 ms
Lead Channel Pacing Threshold Pulse Width: 0.4 ms
Lead Channel Sensing Intrinsic Amplitude: 2.375 mV
Lead Channel Sensing Intrinsic Amplitude: 2.375 mV
Lead Channel Sensing Intrinsic Amplitude: 5.875 mV
Lead Channel Sensing Intrinsic Amplitude: 5.875 mV
Lead Channel Setting Pacing Amplitude: 1.5 V
Lead Channel Setting Pacing Amplitude: 2.5 V
Lead Channel Setting Pacing Pulse Width: 0.5 ms
Lead Channel Setting Sensing Sensitivity: 4 mV

## 2021-03-17 ENCOUNTER — Telehealth: Payer: Self-pay | Admitting: Internal Medicine

## 2021-03-17 NOTE — Telephone Encounter (Signed)
Returned call to pt.  Left a detailed message that the Atorvastatin was sent to [facility], 12/09/2020 for 90 days with 3 refills, enough for 1 year. If she had any further questions, to call the office back.

## 2021-03-17 NOTE — Telephone Encounter (Signed)
*  STAT* If patient is at the pharmacy, call can be transferred to refill team.   1. Which medications need to be refilled? (please list name of each medication and dose if known)  atorvastatin (LIPITOR) 20 MG tablet  2. Which pharmacy/location (including street and city if local pharmacy) is medication to be sent to? [facility] [address], [place] - [address]  3. Do they need a 30 day or 90 day supply?  90 day supply

## 2021-03-22 DIAGNOSIS — Z794 Long term (current) use of insulin: Secondary | ICD-10-CM | POA: Diagnosis not present

## 2021-03-22 DIAGNOSIS — E1065 Type 1 diabetes mellitus with hyperglycemia: Secondary | ICD-10-CM | POA: Diagnosis not present

## 2021-03-22 DIAGNOSIS — E1043 Type 1 diabetes mellitus with diabetic autonomic (poly)neuropathy: Secondary | ICD-10-CM | POA: Diagnosis not present

## 2021-04-02 DIAGNOSIS — E1043 Type 1 diabetes mellitus with diabetic autonomic (poly)neuropathy: Secondary | ICD-10-CM | POA: Diagnosis not present

## 2021-04-02 DIAGNOSIS — F17210 Nicotine dependence, cigarettes, uncomplicated: Secondary | ICD-10-CM | POA: Diagnosis not present

## 2021-04-02 DIAGNOSIS — E10649 Type 1 diabetes mellitus with hypoglycemia without coma: Secondary | ICD-10-CM | POA: Diagnosis not present

## 2021-04-02 DIAGNOSIS — Z9641 Presence of insulin pump (external) (internal): Secondary | ICD-10-CM | POA: Diagnosis not present

## 2021-04-03 NOTE — Progress Notes (Signed)
Remote pacemaker transmission.   

## 2021-04-07 DIAGNOSIS — E1065 Type 1 diabetes mellitus with hyperglycemia: Secondary | ICD-10-CM | POA: Diagnosis not present

## 2021-04-09 DIAGNOSIS — M79674 Pain in right toe(s): Secondary | ICD-10-CM | POA: Diagnosis not present

## 2021-04-09 DIAGNOSIS — B351 Tinea unguium: Secondary | ICD-10-CM | POA: Diagnosis not present

## 2021-04-09 DIAGNOSIS — M79675 Pain in left toe(s): Secondary | ICD-10-CM | POA: Diagnosis not present

## 2021-04-22 DIAGNOSIS — Z794 Long term (current) use of insulin: Secondary | ICD-10-CM | POA: Diagnosis not present

## 2021-04-22 DIAGNOSIS — E1043 Type 1 diabetes mellitus with diabetic autonomic (poly)neuropathy: Secondary | ICD-10-CM | POA: Diagnosis not present

## 2021-04-22 DIAGNOSIS — E1065 Type 1 diabetes mellitus with hyperglycemia: Secondary | ICD-10-CM | POA: Diagnosis not present

## 2021-05-23 DIAGNOSIS — E1065 Type 1 diabetes mellitus with hyperglycemia: Secondary | ICD-10-CM | POA: Diagnosis not present

## 2021-05-23 DIAGNOSIS — E1043 Type 1 diabetes mellitus with diabetic autonomic (poly)neuropathy: Secondary | ICD-10-CM | POA: Diagnosis not present

## 2021-05-23 DIAGNOSIS — Z794 Long term (current) use of insulin: Secondary | ICD-10-CM | POA: Diagnosis not present

## 2021-06-13 ENCOUNTER — Ambulatory Visit (INDEPENDENT_AMBULATORY_CARE_PROVIDER_SITE_OTHER): Payer: HMO

## 2021-06-13 DIAGNOSIS — I442 Atrioventricular block, complete: Secondary | ICD-10-CM | POA: Diagnosis not present

## 2021-06-14 LAB — CUP PACEART REMOTE DEVICE CHECK
Battery Remaining Longevity: 41 mo
Battery Voltage: 2.94 V
Brady Statistic AP VP Percent: 0.82 %
Brady Statistic AP VS Percent: 0 %
Brady Statistic AS VP Percent: 99.14 %
Brady Statistic AS VS Percent: 0.04 %
Brady Statistic RA Percent Paced: 0.81 %
Brady Statistic RV Percent Paced: 99.96 %
Date Time Interrogation Session: 20220728205912
Implantable Lead Implant Date: 20200131
Implantable Lead Implant Date: 20200131
Implantable Lead Location: 753859
Implantable Lead Location: 753860
Implantable Lead Model: 3830
Implantable Lead Model: 5076
Implantable Pulse Generator Implant Date: 20200131
Lead Channel Impedance Value: 285 Ohm
Lead Channel Impedance Value: 361 Ohm
Lead Channel Impedance Value: 456 Ohm
Lead Channel Impedance Value: 475 Ohm
Lead Channel Pacing Threshold Amplitude: 0.5 V
Lead Channel Pacing Threshold Amplitude: 1.5 V
Lead Channel Pacing Threshold Pulse Width: 0.4 ms
Lead Channel Pacing Threshold Pulse Width: 0.4 ms
Lead Channel Sensing Intrinsic Amplitude: 2.5 mV
Lead Channel Sensing Intrinsic Amplitude: 2.5 mV
Lead Channel Sensing Intrinsic Amplitude: 5.875 mV
Lead Channel Sensing Intrinsic Amplitude: 5.875 mV
Lead Channel Setting Pacing Amplitude: 1.5 V
Lead Channel Setting Pacing Amplitude: 2.5 V
Lead Channel Setting Pacing Pulse Width: 0.5 ms
Lead Channel Setting Sensing Sensitivity: 4 mV

## 2021-06-23 DIAGNOSIS — E1043 Type 1 diabetes mellitus with diabetic autonomic (poly)neuropathy: Secondary | ICD-10-CM | POA: Diagnosis not present

## 2021-06-23 DIAGNOSIS — E1065 Type 1 diabetes mellitus with hyperglycemia: Secondary | ICD-10-CM | POA: Diagnosis not present

## 2021-06-23 DIAGNOSIS — Z794 Long term (current) use of insulin: Secondary | ICD-10-CM | POA: Diagnosis not present

## 2021-06-27 DIAGNOSIS — E1043 Type 1 diabetes mellitus with diabetic autonomic (poly)neuropathy: Secondary | ICD-10-CM | POA: Diagnosis not present

## 2021-06-27 DIAGNOSIS — E039 Hypothyroidism, unspecified: Secondary | ICD-10-CM | POA: Diagnosis not present

## 2021-06-27 DIAGNOSIS — I10 Essential (primary) hypertension: Secondary | ICD-10-CM | POA: Diagnosis not present

## 2021-06-27 DIAGNOSIS — E7800 Pure hypercholesterolemia, unspecified: Secondary | ICD-10-CM | POA: Diagnosis not present

## 2021-06-27 DIAGNOSIS — Z79899 Other long term (current) drug therapy: Secondary | ICD-10-CM | POA: Diagnosis not present

## 2021-06-30 DIAGNOSIS — E039 Hypothyroidism, unspecified: Secondary | ICD-10-CM | POA: Diagnosis not present

## 2021-06-30 DIAGNOSIS — E1043 Type 1 diabetes mellitus with diabetic autonomic (poly)neuropathy: Secondary | ICD-10-CM | POA: Diagnosis not present

## 2021-06-30 DIAGNOSIS — Z79899 Other long term (current) drug therapy: Secondary | ICD-10-CM | POA: Diagnosis not present

## 2021-06-30 DIAGNOSIS — D696 Thrombocytopenia, unspecified: Secondary | ICD-10-CM | POA: Diagnosis not present

## 2021-06-30 DIAGNOSIS — E7800 Pure hypercholesterolemia, unspecified: Secondary | ICD-10-CM | POA: Diagnosis not present

## 2021-06-30 DIAGNOSIS — J431 Panlobular emphysema: Secondary | ICD-10-CM | POA: Diagnosis not present

## 2021-06-30 DIAGNOSIS — I10 Essential (primary) hypertension: Secondary | ICD-10-CM | POA: Diagnosis not present

## 2021-06-30 DIAGNOSIS — J449 Chronic obstructive pulmonary disease, unspecified: Secondary | ICD-10-CM | POA: Diagnosis not present

## 2021-06-30 DIAGNOSIS — J439 Emphysema, unspecified: Secondary | ICD-10-CM | POA: Diagnosis not present

## 2021-07-04 DIAGNOSIS — Z9641 Presence of insulin pump (external) (internal): Secondary | ICD-10-CM | POA: Diagnosis not present

## 2021-07-04 DIAGNOSIS — E10649 Type 1 diabetes mellitus with hypoglycemia without coma: Secondary | ICD-10-CM | POA: Diagnosis not present

## 2021-07-04 DIAGNOSIS — F17200 Nicotine dependence, unspecified, uncomplicated: Secondary | ICD-10-CM | POA: Diagnosis not present

## 2021-07-04 DIAGNOSIS — E1043 Type 1 diabetes mellitus with diabetic autonomic (poly)neuropathy: Secondary | ICD-10-CM | POA: Diagnosis not present

## 2021-07-08 DIAGNOSIS — E1065 Type 1 diabetes mellitus with hyperglycemia: Secondary | ICD-10-CM | POA: Diagnosis not present

## 2021-07-08 NOTE — Progress Notes (Signed)
Remote pacemaker transmission.   

## 2021-07-24 DIAGNOSIS — E1043 Type 1 diabetes mellitus with diabetic autonomic (poly)neuropathy: Secondary | ICD-10-CM | POA: Diagnosis not present

## 2021-07-24 DIAGNOSIS — Z794 Long term (current) use of insulin: Secondary | ICD-10-CM | POA: Diagnosis not present

## 2021-07-24 DIAGNOSIS — E1065 Type 1 diabetes mellitus with hyperglycemia: Secondary | ICD-10-CM | POA: Diagnosis not present

## 2021-08-24 DIAGNOSIS — Z794 Long term (current) use of insulin: Secondary | ICD-10-CM | POA: Diagnosis not present

## 2021-08-24 DIAGNOSIS — E1043 Type 1 diabetes mellitus with diabetic autonomic (poly)neuropathy: Secondary | ICD-10-CM | POA: Diagnosis not present

## 2021-08-24 DIAGNOSIS — E1065 Type 1 diabetes mellitus with hyperglycemia: Secondary | ICD-10-CM | POA: Diagnosis not present

## 2021-08-25 DIAGNOSIS — T63421A Toxic effect of venom of ants, accidental (unintentional), initial encounter: Secondary | ICD-10-CM | POA: Diagnosis not present

## 2021-08-25 DIAGNOSIS — E039 Hypothyroidism, unspecified: Secondary | ICD-10-CM | POA: Diagnosis not present

## 2021-08-25 DIAGNOSIS — E1043 Type 1 diabetes mellitus with diabetic autonomic (poly)neuropathy: Secondary | ICD-10-CM | POA: Diagnosis not present

## 2021-08-27 ENCOUNTER — Other Ambulatory Visit (HOSPITAL_COMMUNITY): Payer: HMO

## 2021-08-27 ENCOUNTER — Encounter (HOSPITAL_COMMUNITY): Payer: Self-pay | Admitting: Internal Medicine

## 2021-09-04 ENCOUNTER — Telehealth (HOSPITAL_COMMUNITY): Payer: Self-pay | Admitting: Internal Medicine

## 2021-09-04 NOTE — Telephone Encounter (Signed)
Will send this message to the pts ordering Provider and covering RN, as a general FYI.

## 2021-09-04 NOTE — Telephone Encounter (Signed)
Just an FYI. We have made several attempts to contact this patient including sending a letter to schedule or reschedule their echocardiogram. We will be removing the patient from the echo WQ.  08/27/21 NO SHOWED-MAILED LETTER [facility]      Thank you  

## 2021-09-12 ENCOUNTER — Ambulatory Visit (INDEPENDENT_AMBULATORY_CARE_PROVIDER_SITE_OTHER): Payer: HMO

## 2021-09-12 DIAGNOSIS — I442 Atrioventricular block, complete: Secondary | ICD-10-CM

## 2021-09-12 LAB — CUP PACEART REMOTE DEVICE CHECK
Battery Remaining Longevity: 36 mo
Battery Voltage: 2.93 V
Brady Statistic AP VP Percent: 0.74 %
Brady Statistic AP VS Percent: 0 %
Brady Statistic AS VP Percent: 99.22 %
Brady Statistic AS VS Percent: 0.04 %
Brady Statistic RA Percent Paced: 0.73 %
Brady Statistic RV Percent Paced: 99.96 %
Date Time Interrogation Session: 20221027205718
Implantable Lead Implant Date: 20200131
Implantable Lead Implant Date: 20200131
Implantable Lead Location: 753859
Implantable Lead Location: 753860
Implantable Lead Model: 3830
Implantable Lead Model: 5076
Implantable Pulse Generator Implant Date: 20200131
Lead Channel Impedance Value: 285 Ohm
Lead Channel Impedance Value: 361 Ohm
Lead Channel Impedance Value: 418 Ohm
Lead Channel Impedance Value: 475 Ohm
Lead Channel Pacing Threshold Amplitude: 0.5 V
Lead Channel Pacing Threshold Amplitude: 1.375 V
Lead Channel Pacing Threshold Pulse Width: 0.4 ms
Lead Channel Pacing Threshold Pulse Width: 0.4 ms
Lead Channel Sensing Intrinsic Amplitude: 2.25 mV
Lead Channel Sensing Intrinsic Amplitude: 2.25 mV
Lead Channel Sensing Intrinsic Amplitude: 5.875 mV
Lead Channel Sensing Intrinsic Amplitude: 5.875 mV
Lead Channel Setting Pacing Amplitude: 1.5 V
Lead Channel Setting Pacing Amplitude: 2.5 V
Lead Channel Setting Pacing Pulse Width: 0.5 ms
Lead Channel Setting Sensing Sensitivity: 4 mV

## 2021-09-22 NOTE — Progress Notes (Signed)
Remote pacemaker transmission.   

## 2021-09-24 DIAGNOSIS — Z794 Long term (current) use of insulin: Secondary | ICD-10-CM | POA: Diagnosis not present

## 2021-09-24 DIAGNOSIS — I10 Essential (primary) hypertension: Secondary | ICD-10-CM | POA: Diagnosis not present

## 2021-09-24 DIAGNOSIS — Z79899 Other long term (current) drug therapy: Secondary | ICD-10-CM | POA: Diagnosis not present

## 2021-09-24 DIAGNOSIS — E1065 Type 1 diabetes mellitus with hyperglycemia: Secondary | ICD-10-CM | POA: Diagnosis not present

## 2021-09-24 DIAGNOSIS — E039 Hypothyroidism, unspecified: Secondary | ICD-10-CM | POA: Diagnosis not present

## 2021-09-24 DIAGNOSIS — E1043 Type 1 diabetes mellitus with diabetic autonomic (poly)neuropathy: Secondary | ICD-10-CM | POA: Diagnosis not present

## 2021-10-01 ENCOUNTER — Other Ambulatory Visit: Payer: Self-pay | Admitting: Internal Medicine

## 2021-10-01 DIAGNOSIS — D696 Thrombocytopenia, unspecified: Secondary | ICD-10-CM | POA: Diagnosis not present

## 2021-10-01 DIAGNOSIS — Z79899 Other long term (current) drug therapy: Secondary | ICD-10-CM | POA: Diagnosis not present

## 2021-10-01 DIAGNOSIS — Z1231 Encounter for screening mammogram for malignant neoplasm of breast: Secondary | ICD-10-CM | POA: Diagnosis not present

## 2021-10-01 DIAGNOSIS — I10 Essential (primary) hypertension: Secondary | ICD-10-CM | POA: Diagnosis not present

## 2021-10-01 DIAGNOSIS — E7800 Pure hypercholesterolemia, unspecified: Secondary | ICD-10-CM | POA: Diagnosis not present

## 2021-10-01 DIAGNOSIS — J431 Panlobular emphysema: Secondary | ICD-10-CM | POA: Diagnosis not present

## 2021-10-01 DIAGNOSIS — E1065 Type 1 diabetes mellitus with hyperglycemia: Secondary | ICD-10-CM | POA: Diagnosis not present

## 2021-10-01 DIAGNOSIS — E039 Hypothyroidism, unspecified: Secondary | ICD-10-CM | POA: Diagnosis not present

## 2021-10-07 DIAGNOSIS — F17200 Nicotine dependence, unspecified, uncomplicated: Secondary | ICD-10-CM | POA: Diagnosis not present

## 2021-10-07 DIAGNOSIS — E1065 Type 1 diabetes mellitus with hyperglycemia: Secondary | ICD-10-CM | POA: Diagnosis not present

## 2021-10-07 DIAGNOSIS — Z9641 Presence of insulin pump (external) (internal): Secondary | ICD-10-CM | POA: Diagnosis not present

## 2021-10-07 DIAGNOSIS — E1043 Type 1 diabetes mellitus with diabetic autonomic (poly)neuropathy: Secondary | ICD-10-CM | POA: Diagnosis not present

## 2021-10-07 DIAGNOSIS — E10649 Type 1 diabetes mellitus with hypoglycemia without coma: Secondary | ICD-10-CM | POA: Diagnosis not present

## 2021-10-25 DIAGNOSIS — E1065 Type 1 diabetes mellitus with hyperglycemia: Secondary | ICD-10-CM | POA: Diagnosis not present

## 2021-10-25 DIAGNOSIS — Z794 Long term (current) use of insulin: Secondary | ICD-10-CM | POA: Diagnosis not present

## 2021-10-25 DIAGNOSIS — E1043 Type 1 diabetes mellitus with diabetic autonomic (poly)neuropathy: Secondary | ICD-10-CM | POA: Diagnosis not present

## 2021-11-25 DIAGNOSIS — E1043 Type 1 diabetes mellitus with diabetic autonomic (poly)neuropathy: Secondary | ICD-10-CM | POA: Diagnosis not present

## 2021-11-25 DIAGNOSIS — Z794 Long term (current) use of insulin: Secondary | ICD-10-CM | POA: Diagnosis not present

## 2021-11-25 DIAGNOSIS — E1065 Type 1 diabetes mellitus with hyperglycemia: Secondary | ICD-10-CM | POA: Diagnosis not present

## 2021-11-28 DIAGNOSIS — M79675 Pain in left toe(s): Secondary | ICD-10-CM | POA: Diagnosis not present

## 2021-11-28 DIAGNOSIS — M2012 Hallux valgus (acquired), left foot: Secondary | ICD-10-CM | POA: Diagnosis not present

## 2021-11-28 DIAGNOSIS — B351 Tinea unguium: Secondary | ICD-10-CM | POA: Diagnosis not present

## 2021-11-28 DIAGNOSIS — M2011 Hallux valgus (acquired), right foot: Secondary | ICD-10-CM | POA: Diagnosis not present

## 2021-11-28 DIAGNOSIS — L600 Ingrowing nail: Secondary | ICD-10-CM | POA: Diagnosis not present

## 2021-11-28 DIAGNOSIS — M79674 Pain in right toe(s): Secondary | ICD-10-CM | POA: Diagnosis not present

## 2021-11-28 DIAGNOSIS — E1042 Type 1 diabetes mellitus with diabetic polyneuropathy: Secondary | ICD-10-CM | POA: Diagnosis not present

## 2021-12-07 NOTE — Progress Notes (Signed)
Cardiology Office Note   Date:  12/09/2021   ID:  Madeline [name], DOB 1934-02-17, MRN [id number]  PCP:  Marguarite Arbour, MD  Cardiologist:   Dietrich Pates, MD   F/U of CAD   History of Present Illness: Madeline [name] is a 88 y.o. female with a history of CAD, s/p inferior STEMI in 2012  She had occlusion of the RCA and underwent PTCA with DES to the prox RCA. It was a right dominant system with dual PDAs. Other vessels had irregularities. She was d/cd home on Brilinta. The pt also has a history of bradycardia and is s/p PPM  I saw the pt in clinic in Jan 2022   Since then she has done OK   Breathing is OK  No CP  No dizziness Did fall in the past few months   Said her head got in front of her feet      Outpatient Medications Prior to Visit  Medication Sig Dispense Refill   acetaminophen (TYLENOL) 325 MG tablet Take 1-2 tablets (325-650 mg total) by mouth every 6 (six) hours as needed for mild pain or moderate pain.     atorvastatin (LIPITOR) 20 MG tablet Take 1 tablet (20 mg total) by mouth daily at 6 PM. 90 tablet 3   cetirizine (ZYRTEC) 10 MG tablet Take 10 mg by mouth daily.     citalopram (CELEXA) 20 MG tablet Take 20 mg by mouth daily.     CONTOUR NEXT TEST test strip USE 1 STRIP TO CHECK BLOOD SUGAR EIGHT TO NINE TIMES DAILY.     Insulin Human (INSULIN PUMP) SOLN Inject 1 each into the skin continuous. Used with Novolog insulin     Lancets 28G MISC Use as instructed. 7-8 times daily.     levothyroxine (SYNTHROID, LEVOTHROID) 50 MCG tablet Take 50 mcg by mouth daily before breakfast.   3   lisinopril (PRINIVIL,ZESTRIL) 20 MG tablet Take 1 tablet (20 mg total) by mouth daily. 30 tablet 1   Multiple Vitamin (MULTIVITAMIN WITH MINERALS) TABS tablet Take 1 tablet by mouth daily.     NOVOLOG 100 UNIT/ML injection Inject 0-50 Units into the skin continuous. Up to 50 units/ day via pump     atorvastatin (LIPITOR) 20 MG tablet Take 20 mg by mouth daily. (Patient not taking: Reported  on 12/09/2021)     levothyroxine (SYNTHROID) 100 MCG tablet Take 1 tablet by mouth daily before breakfast. (Patient not taking: Reported on 12/09/2021)     levothyroxine (SYNTHROID) 75 MCG tablet Take 75 mcg by mouth daily. (Patient not taking: Reported on 12/09/2021)     lisinopril (ZESTRIL) 20 MG tablet Take 10 mg by mouth daily. (Patient not taking: Reported on 12/09/2021)     No facility-administered medications prior to visit.     Allergies:   Darvon, Propoxyphene, and Sulfa antibiotics   Past Medical History:  Diagnosis Date   Bradycardia    Coronary artery disease    Diabetes mellitus    Dyslipidemia    GERD (gastroesophageal reflux disease)    Hypothyroid    Presence of permanent cardiac pacemaker 12/16/2018   Medtronic dual lead pacemaker    Past Surgical History:  Procedure Laterality Date   APPENDECTOMY     BREAST BIOPSY Right 1967   EXCISIONAL - NEG   CHOLECYSTECTOMY     CORONARY STENT INTERVENTION     INSERT / REPLACE / REMOVE PACEMAKER  12/16/2018   no surgical hx  PACEMAKER IMPLANT N/A 12/16/2018   Procedure: PACEMAKER IMPLANT;  Surgeon: Marinus Maw, MD;  Location: [facility] INVASIVE CV LAB;  Service: Cardiovascular;  Laterality: N/A;     Social History:  The patient  reports that she has been smoking cigarettes. She has a 50.00 pack-year smoking history. She has never used smokeless tobacco. She reports that she does not drink alcohol and does not use drugs.   Family History:  The patient's family history includes Breast cancer (age of onset: 97) in her sister; Breast cancer (age of onset: 53) in her sister; Cancer in her sister, sister, and sister; Diabetes in her son; Heart Problems in her mother; Other in her father.    ROS:  Please see the history of present illness. All other systems are reviewed and  Negative to the above problem except as noted.    PHYSICAL EXAM: VS:  BP 138/64    Pulse 73    Ht 5\' 2"  (1.575 m)    Wt 114 lb 9.6 oz (52 kg)    SpO2 96%     BMI 20.96 kg/m   GEN: Thin 88 yo in no acute distress  HEENT: normal  Neck: No JVD    Nobruits   Cardiac: RRR; Gr III/VI systolic m LSB      No LE edema   Respiratory:  clear to auscultation bilaterally, GI: soft, nontender, nondistended, + BS  No hepatomegaly  MS: no deformity Moving all extremities   Skin: warm and dry, no rash Neuro:  Strength and sensation are intact Psych: euthymic mood, full affect   EKG:  EKG ordered today   SR 73   Ventricular   Paced    Lipid Panel    Component Value Date/Time   CHOL 134 08/07/2019 0857   TRIG 51 08/07/2019 0857   HDL 71 08/07/2019 0857   CHOLHDL 1.9 08/07/2019 0857   CHOLHDL 1.7 08/03/2016 1519   VLDL 19 08/03/2016 1519   LDLCALC 51 08/07/2019 0857      Wt Readings from Last 3 Encounters:  12/09/21 114 lb 9.6 oz (52 kg)  12/23/20 113 lb 3.2 oz (51.3 kg)  12/09/20 113 lb 6.4 oz (51.4 kg)      ASSESSMENT AND PLAN:  1  CAD  Remote intervention   Remains symptom free    2  AS  MIld on echo in 2019   Will order repeat to reevaluate   3  HL   LDL in NOv was 57  HDL 71  Continue lipitor    4  Hx bradycardia   S/p PPM     5.  Tob   Counselled again on cessation  She continues to smoke  6  HTN  Good control  7  DM  A1C 7.9  Reviewed diet       F/U in 1 year   Current medicines are reviewed at length with the patient today.  The patient does not have concerns regarding medicines. Signed, 2020, MD  12/09/2021 5:30 PM    [facility] [address] [place], [place], [place]  [place] Phone: [phone]; Fax: [phone]

## 2021-12-09 ENCOUNTER — Other Ambulatory Visit: Payer: Self-pay

## 2021-12-09 ENCOUNTER — Encounter: Payer: Self-pay | Admitting: Internal Medicine

## 2021-12-09 ENCOUNTER — Ambulatory Visit (INDEPENDENT_AMBULATORY_CARE_PROVIDER_SITE_OTHER): Payer: HMO | Admitting: Internal Medicine

## 2021-12-09 VITALS — BP 138/64 | HR 73 | Ht 62.0 in | Wt 114.6 lb

## 2021-12-09 DIAGNOSIS — I442 Atrioventricular block, complete: Secondary | ICD-10-CM

## 2021-12-09 DIAGNOSIS — I2510 Atherosclerotic heart disease of native coronary artery without angina pectoris: Secondary | ICD-10-CM

## 2021-12-09 DIAGNOSIS — I350 Nonrheumatic aortic (valve) stenosis: Secondary | ICD-10-CM

## 2021-12-09 NOTE — Patient Instructions (Signed)
Medication Instructions:  °Your physician recommends that you continue on your current medications as directed. Please refer to the Current Medication list given to you today. ° °*If you need a refill on your cardiac medications before your next appointment, please call your pharmacy* ° ° °Lab Work: °none °If you have labs (blood work) drawn today and your tests are completely normal, you will receive your results only by: °MyChart Message (if you have MyChart) OR °A paper copy in the mail °If you have any lab test that is abnormal or we need to change your treatment, we will call you to review the results. ° ° °Testing/Procedures: °Your physician has requested that you have an echocardiogram. Echocardiography is a painless test that uses sound waves to create images of your heart. It provides your doctor with information about the size and shape of your heart and how well your heart’s chambers and valves are working. This procedure takes approximately one hour. There are no restrictions for this procedure. ° ° ° °Follow-Up: °At [facility], you and your health needs are our priority.  As part of our continuing mission to provide you with exceptional heart care, we have created designated Provider Care Teams.  These Care Teams include your primary Cardiologist (physician) and Advanced Practice Providers (APPs -  Physician Assistants and Nurse Practitioners) who all work together to provide you with the care you need, when you need it. ° °We recommend signing up for the patient portal called "MyChart".  Sign up information is provided on this After Visit Summary.  MyChart is used to connect with patients for Virtual Visits (Telemedicine).  Patients are able to view lab/test results, encounter notes, upcoming appointments, etc.  Non-urgent messages can be sent to your provider as well.   °To learn more about what you can do with MyChart, go to [email or web address].   ° °Your next appointment:   °1 year(s) ° °The  format for your next appointment:   °In Person ° °Provider:   °Paula Ross, MD   ° ° °Other Instructions °  °

## 2021-12-12 ENCOUNTER — Ambulatory Visit (INDEPENDENT_AMBULATORY_CARE_PROVIDER_SITE_OTHER): Payer: HMO

## 2021-12-12 DIAGNOSIS — I442 Atrioventricular block, complete: Secondary | ICD-10-CM | POA: Diagnosis not present

## 2021-12-12 LAB — CUP PACEART REMOTE DEVICE CHECK
Battery Remaining Longevity: 34 mo
Battery Voltage: 2.93 V
Brady Statistic AP VP Percent: 1.98 %
Brady Statistic AP VS Percent: 0 %
Brady Statistic AS VP Percent: 97.96 %
Brady Statistic AS VS Percent: 0.06 %
Brady Statistic RA Percent Paced: 1.96 %
Brady Statistic RV Percent Paced: 99.94 %
Date Time Interrogation Session: 20230126185619
Implantable Lead Implant Date: 20200131
Implantable Lead Implant Date: 20200131
Implantable Lead Location: 753859
Implantable Lead Location: 753860
Implantable Lead Model: 3830
Implantable Lead Model: 5076
Implantable Pulse Generator Implant Date: 20200131
Lead Channel Impedance Value: 304 Ohm
Lead Channel Impedance Value: 361 Ohm
Lead Channel Impedance Value: 437 Ohm
Lead Channel Impedance Value: 494 Ohm
Lead Channel Pacing Threshold Amplitude: 0.5 V
Lead Channel Pacing Threshold Amplitude: 1.625 V
Lead Channel Pacing Threshold Pulse Width: 0.4 ms
Lead Channel Pacing Threshold Pulse Width: 0.4 ms
Lead Channel Sensing Intrinsic Amplitude: 2.5 mV
Lead Channel Sensing Intrinsic Amplitude: 2.5 mV
Lead Channel Sensing Intrinsic Amplitude: 5.875 mV
Lead Channel Sensing Intrinsic Amplitude: 5.875 mV
Lead Channel Setting Pacing Amplitude: 1.5 V
Lead Channel Setting Pacing Amplitude: 2.5 V
Lead Channel Setting Pacing Pulse Width: 0.5 ms
Lead Channel Setting Sensing Sensitivity: 4 mV

## 2021-12-14 ENCOUNTER — Other Ambulatory Visit: Payer: Self-pay

## 2021-12-14 ENCOUNTER — Emergency Department
Admission: EM | Admit: 2021-12-14 | Discharge: 2021-12-15 | Disposition: A | Discharge status: Home / Self Care | Payer: HMO | Source: Home / Self Care | Attending: Emergency Medicine | Admitting: Emergency Medicine

## 2021-12-14 DIAGNOSIS — I2510 Atherosclerotic heart disease of native coronary artery without angina pectoris: Secondary | ICD-10-CM | POA: Insufficient documentation

## 2021-12-14 DIAGNOSIS — N179 Acute kidney failure, unspecified: Secondary | ICD-10-CM | POA: Diagnosis present

## 2021-12-14 DIAGNOSIS — Z20822 Contact with and (suspected) exposure to covid-19: Secondary | ICD-10-CM | POA: Insufficient documentation

## 2021-12-14 DIAGNOSIS — F32A Depression, unspecified: Secondary | ICD-10-CM | POA: Diagnosis present

## 2021-12-14 DIAGNOSIS — I442 Atrioventricular block, complete: Secondary | ICD-10-CM | POA: Diagnosis present

## 2021-12-14 DIAGNOSIS — E039 Hypothyroidism, unspecified: Secondary | ICD-10-CM | POA: Diagnosis present

## 2021-12-14 DIAGNOSIS — E871 Hypo-osmolality and hyponatremia: Secondary | ICD-10-CM | POA: Diagnosis present

## 2021-12-14 DIAGNOSIS — Z955 Presence of coronary angioplasty implant and graft: Secondary | ICD-10-CM | POA: Diagnosis not present

## 2021-12-14 DIAGNOSIS — E1110 Type 2 diabetes mellitus with ketoacidosis without coma: Secondary | ICD-10-CM | POA: Diagnosis present

## 2021-12-14 DIAGNOSIS — Z9049 Acquired absence of other specified parts of digestive tract: Secondary | ICD-10-CM | POA: Diagnosis not present

## 2021-12-14 DIAGNOSIS — E785 Hyperlipidemia, unspecified: Secondary | ICD-10-CM | POA: Diagnosis present

## 2021-12-14 DIAGNOSIS — J984 Other disorders of lung: Secondary | ICD-10-CM | POA: Diagnosis not present

## 2021-12-14 DIAGNOSIS — E861 Hypovolemia: Secondary | ICD-10-CM | POA: Diagnosis present

## 2021-12-14 DIAGNOSIS — R571 Hypovolemic shock: Secondary | ICD-10-CM | POA: Diagnosis present

## 2021-12-14 DIAGNOSIS — Z950 Presence of cardiac pacemaker: Secondary | ICD-10-CM | POA: Diagnosis not present

## 2021-12-14 DIAGNOSIS — Z760 Encounter for issue of repeat prescription: Secondary | ICD-10-CM | POA: Insufficient documentation

## 2021-12-14 DIAGNOSIS — K219 Gastro-esophageal reflux disease without esophagitis: Secondary | ICD-10-CM | POA: Diagnosis present

## 2021-12-14 DIAGNOSIS — R519 Headache, unspecified: Secondary | ICD-10-CM | POA: Insufficient documentation

## 2021-12-14 DIAGNOSIS — Z9641 Presence of insulin pump (external) (internal): Secondary | ICD-10-CM | POA: Diagnosis present

## 2021-12-14 DIAGNOSIS — F17210 Nicotine dependence, cigarettes, uncomplicated: Secondary | ICD-10-CM | POA: Diagnosis present

## 2021-12-14 DIAGNOSIS — E1010 Type 1 diabetes mellitus with ketoacidosis without coma: Secondary | ICD-10-CM | POA: Diagnosis present

## 2021-12-14 DIAGNOSIS — E1065 Type 1 diabetes mellitus with hyperglycemia: Secondary | ICD-10-CM | POA: Insufficient documentation

## 2021-12-14 DIAGNOSIS — R5381 Other malaise: Secondary | ICD-10-CM | POA: Insufficient documentation

## 2021-12-14 DIAGNOSIS — G9341 Metabolic encephalopathy: Secondary | ICD-10-CM | POA: Diagnosis present

## 2021-12-14 DIAGNOSIS — I9589 Other hypotension: Secondary | ICD-10-CM | POA: Diagnosis not present

## 2021-12-14 DIAGNOSIS — T383X6A Underdosing of insulin and oral hypoglycemic [antidiabetic] drugs, initial encounter: Secondary | ICD-10-CM | POA: Diagnosis present

## 2021-12-14 DIAGNOSIS — E875 Hyperkalemia: Secondary | ICD-10-CM | POA: Diagnosis present

## 2021-12-14 DIAGNOSIS — Z885 Allergy status to narcotic agent status: Secondary | ICD-10-CM | POA: Diagnosis not present

## 2021-12-14 DIAGNOSIS — D693 Immune thrombocytopenic purpura: Secondary | ICD-10-CM | POA: Diagnosis present

## 2021-12-14 DIAGNOSIS — I248 Other forms of acute ischemic heart disease: Secondary | ICD-10-CM | POA: Diagnosis present

## 2021-12-14 DIAGNOSIS — E860 Dehydration: Secondary | ICD-10-CM | POA: Diagnosis present

## 2021-12-14 DIAGNOSIS — R9431 Abnormal electrocardiogram [ECG] [EKG]: Secondary | ICD-10-CM | POA: Diagnosis not present

## 2021-12-14 DIAGNOSIS — R778 Other specified abnormalities of plasma proteins: Secondary | ICD-10-CM | POA: Diagnosis not present

## 2021-12-14 LAB — URINALYSIS, ROUTINE W REFLEX MICROSCOPIC
Bilirubin Urine: NEGATIVE
Glucose, UA: 500 mg/dL — AB
Ketones, ur: >160 mg/dL — AB
Leukocytes,Ua: NEGATIVE
Nitrite: NEGATIVE
Protein, ur: NEGATIVE mg/dL
Specific Gravity, Urine: 1.01 (ref 1.005–1.030)
pH: 5 (ref 5.0–8.0)

## 2021-12-14 LAB — COMPREHENSIVE METABOLIC PANEL
ALT: 20 U/L (ref 0–44)
AST: 28 U/L (ref 15–41)
Albumin: 3.7 g/dL (ref 3.5–5.0)
Alkaline Phosphatase: 89 U/L (ref 38–126)
Anion gap: 10 (ref 5–15)
BUN: 15 mg/dL (ref 8–23)
CO2: 26 mmol/L (ref 22–32)
Calcium: 9 mg/dL (ref 8.9–10.3)
Chloride: 99 mmol/L (ref 98–111)
Creatinine, Ser: 0.89 mg/dL (ref 0.44–1.00)
GFR, Estimated: >60 mL/min (ref >60)
Glucose, Bld: 421 mg/dL — ABNORMAL HIGH (ref 70–99)
Potassium: 5.1 mmol/L (ref 3.5–5.1)
Sodium: 135 mmol/L (ref 135–145)
Total Bilirubin: 0.8 mg/dL (ref 0.3–1.2)
Total Protein: 6.2 g/dL — ABNORMAL LOW (ref 6.5–8.1)

## 2021-12-14 LAB — CBC
HCT: 39.7 % (ref 36.0–46.0)
Hemoglobin: 12.7 g/dL (ref 12.0–15.0)
MCH: 30.2 pg (ref 26.0–34.0)
MCHC: 32 g/dL (ref 30.0–36.0)
MCV: 94.5 fL (ref 80.0–100.0)
Platelets: 66 10*3/uL — ABNORMAL LOW (ref 150–400)
RBC: 4.2 MIL/uL (ref 3.87–5.11)
RDW: 12.6 % (ref 11.5–15.5)
WBC: 7.5 10*3/uL (ref 4.0–10.5)
nRBC: 0 % (ref 0.0–0.2)

## 2021-12-14 LAB — CBG MONITORING, ED: Glucose-Capillary: 317 mg/dL — ABNORMAL HIGH (ref 70–99)

## 2021-12-14 LAB — URINALYSIS, MICROSCOPIC (REFLEX): Squamous Epithelial / HPF: NONE SEEN (ref 0–5)

## 2021-12-14 LAB — RESP PANEL BY RT-PCR (FLU A&B, COVID) ARPGX2
Influenza A by PCR: NEGATIVE
Influenza B by PCR: NEGATIVE
SARS Coronavirus 2 by RT PCR: NEGATIVE

## 2021-12-14 MED ORDER — LACTATED RINGERS IV SOLN: INTRAVENOUS | Status: DC

## 2021-12-14 MED ORDER — LACTATED RINGERS IV BOLUS
500.0000 mL | Freq: Once | INTRAVENOUS | Status: AC
Start: 2021-12-15 — End: 2021-12-15
  Administered 2021-12-15: 500 mL via INTRAVENOUS

## 2021-12-14 MED ORDER — LACTATED RINGERS IV BOLUS
1000.0000 mL | Freq: Once | INTRAVENOUS | Status: AC
  Administered 2021-12-14: 1000 mL via INTRAVENOUS

## 2021-12-14 MED ORDER — INSULIN ASPART 100 UNIT/ML IJ SOLN
6.0000 [IU] | Freq: Once | INTRAMUSCULAR | Status: AC
  Administered 2021-12-14: 6 [IU] via INTRAVENOUS
  Filled 2021-12-14: qty 1

## 2021-12-14 MED ORDER — NOVOLOG 100 UNIT/ML ~~LOC~~ SOLN: 0.0000 [IU] | SUBCUTANEOUS | 0 refills | Status: DC

## 2021-12-14 MED ORDER — INSULIN ASPART 100 UNIT/ML IJ SOLN
6.0000 [IU] | Freq: Once | INTRAMUSCULAR | Status: AC
Start: 2021-12-14 — End: 2021-12-14
  Administered 2021-12-14: 6 [IU] via INTRAVENOUS
  Filled 2021-12-14: qty 1

## 2021-12-14 NOTE — ED Provider Notes (Signed)
[facility] Provider Note    Event Date/Time   First MD Initiated Contact with Patient 12/14/21 2107     (approximate)   History   Hyperglycemia   HPI  Madeline [name] is a 88 y.o. female with a history of diabetes, heart block, CAD who comes ED complaining of hyperglycemia with a blood sugar over 500.  States that her insulin pump has run out 2 days ago, and she does not have any insulin refills to reload the reservoir.  With this she is having thirst, frequent urination, dry mouth.  Denies fever chest pain shortness of breath or cough.  Does have some mild generalized headache.  No other pain complaints.  No vision changes paresthesias or motor weakness.  No falls or trauma.  Eating and drinking normally.  She has her other insulin pump equipment and tubing.  Denies dysuria, denies body aches or chills.  No sick contacts.     Physical Exam   Triage Vital Signs: ED Triage Vitals [12/14/21 1905]  Enc Vitals Group     BP (!) 160/77     Pulse Rate 83     Resp 16     Temp 98.4 F (36.9 C)     Temp Source Oral     SpO2 96 %     Weight 114 lb 10.2 oz (52 kg)     Height 5\' 2"  (1.575 m)     Head Circumference      Peak Flow      Pain Score 6     Pain Loc      Pain Edu?      Excl. in GC?     Most recent vital signs: Vitals:   12/14/21 1905 12/14/21 2217  BP: (!) 160/77 (!) 124/39  Pulse: 83 98  Resp: 16 16  Temp: 98.4 F (36.9 C) 98.3 F (36.8 C)  SpO2: 96% 100%     General: Awake, no distress.  CV:  Good peripheral perfusion.  Normal pulses and heart sounds Resp:  Normal effort.  Clear to auscultation bilaterally Abd:  No distention.  Soft and nontender.  No suprapubic tenderness Other:  No soft tissue inflammatory changes.   ED Results / Procedures / Treatments   Labs (all labs ordered are listed, but only abnormal results are displayed) Labs Reviewed  CBC - Abnormal; Notable for the following components:      Result Value    Platelets 66 (*)    All other components within normal limits  COMPREHENSIVE METABOLIC PANEL - Abnormal; Notable for the following components:   Glucose, Bld 421 (*)    Total Protein 6.2 (*)    All other components within normal limits  CBG MONITORING, ED - Abnormal; Notable for the following components:   Glucose-Capillary 317 (*)    All other components within normal limits  RESP PANEL BY RT-PCR (FLU A&B, COVID) [id number]  URINALYSIS, ROUTINE W REFLEX MICROSCOPIC     EKG     RADIOLOGY     PROCEDURES:  Critical Care performed: Yes, see critical care procedure note(s)  .Critical Care Performed by: [id number], MD Authorized by: Sharman Cheek, MD   Critical care provider statement:    Critical care time (minutes):  32   Critical care time was exclusive of:  Separately billable procedures and treating other patients   Critical care was necessary to treat or prevent imminent or life-threatening deterioration of the following conditions:  Dehydration, endocrine crisis and metabolic crisis  Critical care was time spent personally by me on the following activities:  Development of treatment plan with patient or surrogate, discussions with consultants, evaluation of patient's response to treatment, examination of patient, obtaining history from patient or surrogate, ordering and performing treatments and interventions, ordering and review of laboratory studies, ordering and review of radiographic studies, pulse oximetry, re-evaluation of patient's condition and review of old charts   MEDICATIONS ORDERED IN ED: Medications  lactated ringers bolus 1,000 mL (0 mLs Intravenous Stopped 12/14/21 2237)  insulin aspart (novoLOG) injection 6 Units (6 Units Intravenous Given 12/14/21 2140)  insulin aspart (novoLOG) injection 6 Units (6 Units Intravenous Given 12/14/21 2300)     IMPRESSION / MDM / ASSESSMENT AND PLAN / ED COURSE  I reviewed the triage vital signs and the nursing  notes.                              Differential diagnosis includes, but is not limited to, electrolyte abnormality, dehydration, DKA, UTI, viral illness, medication noncompliance     Patient presents with malaise and headache in the setting of running out of her insulin and hyperglycemia.  Insulin pump does appear to be functional.  Patient given 1 L IV fluid bolus for rehydration.  Labs confirm hyperglycemia, but no acidosis.  No anemia.  Electrolytes are unremarkable.  Will check urinalysis and prescribe antibiotics as indicated if there is signs of cystitis.  Will check COVID/flu.  After 6 units IV aspart and IV fluid bolus, blood sugar improved to 317.  Will give additional 6 units IV insulin since she will not be able to obtain refill of insulin pump until tomorrow.  I have sent a refill to her pharmacy at [facility].  She does not require admission due to the reassuring serum labs showing no metabolic acidosis or severe electrolyte derangement.  Anticipate she will be stable for discharge after repeat CBG with the second dose of IV insulin.       FINAL CLINICAL IMPRESSION(S) / ED DIAGNOSES   Final diagnoses:  Type 1 diabetes mellitus with hyperglycemia (HCC)     Rx / DC Orders   ED Discharge Orders          Ordered    NOVOLOG 100 UNIT/ML injection  Continuous        12/14/21 2320             Note:  This document was prepared using Dragon voice recognition software and may include unintentional dictation errors.   Sharman Cheek, MD 12/14/21 [phone]

## 2021-12-14 NOTE — ED Triage Notes (Signed)
Pt states blood sugar over 500 today at home. Pt states she is having a headache, denies vomiting, nausea, diarrhea or other pain.

## 2021-12-15 ENCOUNTER — Inpatient Hospital Stay: Payer: HMO

## 2021-12-15 ENCOUNTER — Emergency Department: Payer: HMO

## 2021-12-15 ENCOUNTER — Encounter: Payer: Self-pay | Admitting: Emergency Medicine

## 2021-12-15 ENCOUNTER — Inpatient Hospital Stay
Admission: EM | Admit: 2021-12-15 | Discharge: 2021-12-19 | DRG: 637 | Disposition: A | Discharge status: Home Health | Payer: HMO | Attending: Internal Medicine | Admitting: Internal Medicine

## 2021-12-15 ENCOUNTER — Other Ambulatory Visit: Payer: Self-pay

## 2021-12-15 DIAGNOSIS — Z20822 Contact with and (suspected) exposure to covid-19: Secondary | ICD-10-CM | POA: Diagnosis present

## 2021-12-15 DIAGNOSIS — Z888 Allergy status to other drugs, medicaments and biological substances status: Secondary | ICD-10-CM

## 2021-12-15 DIAGNOSIS — Z9641 Presence of insulin pump (external) (internal): Secondary | ICD-10-CM | POA: Diagnosis present

## 2021-12-15 DIAGNOSIS — E861 Hypovolemia: Secondary | ICD-10-CM | POA: Diagnosis present

## 2021-12-15 DIAGNOSIS — Z955 Presence of coronary angioplasty implant and graft: Secondary | ICD-10-CM | POA: Diagnosis not present

## 2021-12-15 DIAGNOSIS — R571 Hypovolemic shock: Secondary | ICD-10-CM | POA: Diagnosis present

## 2021-12-15 DIAGNOSIS — I252 Old myocardial infarction: Secondary | ICD-10-CM

## 2021-12-15 DIAGNOSIS — G9341 Metabolic encephalopathy: Secondary | ICD-10-CM | POA: Diagnosis present

## 2021-12-15 DIAGNOSIS — E871 Hypo-osmolality and hyponatremia: Secondary | ICD-10-CM | POA: Diagnosis present

## 2021-12-15 DIAGNOSIS — Z950 Presence of cardiac pacemaker: Secondary | ICD-10-CM | POA: Diagnosis not present

## 2021-12-15 DIAGNOSIS — T383X6A Underdosing of insulin and oral hypoglycemic [antidiabetic] drugs, initial encounter: Secondary | ICD-10-CM | POA: Diagnosis present

## 2021-12-15 DIAGNOSIS — E785 Hyperlipidemia, unspecified: Secondary | ICD-10-CM | POA: Diagnosis present

## 2021-12-15 DIAGNOSIS — I2510 Atherosclerotic heart disease of native coronary artery without angina pectoris: Secondary | ICD-10-CM | POA: Diagnosis present

## 2021-12-15 DIAGNOSIS — I442 Atrioventricular block, complete: Secondary | ICD-10-CM | POA: Diagnosis present

## 2021-12-15 DIAGNOSIS — E1010 Type 1 diabetes mellitus with ketoacidosis without coma: Secondary | ICD-10-CM | POA: Diagnosis not present

## 2021-12-15 DIAGNOSIS — F17210 Nicotine dependence, cigarettes, uncomplicated: Secondary | ICD-10-CM | POA: Diagnosis present

## 2021-12-15 DIAGNOSIS — E1110 Type 2 diabetes mellitus with ketoacidosis without coma: Secondary | ICD-10-CM | POA: Diagnosis present

## 2021-12-15 DIAGNOSIS — Z885 Allergy status to narcotic agent status: Secondary | ICD-10-CM

## 2021-12-15 DIAGNOSIS — D693 Immune thrombocytopenic purpura: Secondary | ICD-10-CM | POA: Diagnosis present

## 2021-12-15 DIAGNOSIS — I248 Other forms of acute ischemic heart disease: Secondary | ICD-10-CM | POA: Diagnosis present

## 2021-12-15 DIAGNOSIS — R7989 Other specified abnormal findings of blood chemistry: Secondary | ICD-10-CM | POA: Diagnosis present

## 2021-12-15 DIAGNOSIS — Z9049 Acquired absence of other specified parts of digestive tract: Secondary | ICD-10-CM | POA: Diagnosis not present

## 2021-12-15 DIAGNOSIS — F32A Depression, unspecified: Secondary | ICD-10-CM | POA: Diagnosis present

## 2021-12-15 DIAGNOSIS — I959 Hypotension, unspecified: Secondary | ICD-10-CM | POA: Diagnosis present

## 2021-12-15 DIAGNOSIS — Z9114 Patient's other noncompliance with medication regimen: Secondary | ICD-10-CM

## 2021-12-15 DIAGNOSIS — Z794 Long term (current) use of insulin: Secondary | ICD-10-CM

## 2021-12-15 DIAGNOSIS — E875 Hyperkalemia: Secondary | ICD-10-CM | POA: Diagnosis present

## 2021-12-15 DIAGNOSIS — K219 Gastro-esophageal reflux disease without esophagitis: Secondary | ICD-10-CM | POA: Diagnosis present

## 2021-12-15 DIAGNOSIS — E039 Hypothyroidism, unspecified: Secondary | ICD-10-CM | POA: Diagnosis present

## 2021-12-15 DIAGNOSIS — Z833 Family history of diabetes mellitus: Secondary | ICD-10-CM

## 2021-12-15 DIAGNOSIS — I9589 Other hypotension: Secondary | ICD-10-CM | POA: Diagnosis not present

## 2021-12-15 DIAGNOSIS — E860 Dehydration: Secondary | ICD-10-CM | POA: Diagnosis present

## 2021-12-15 DIAGNOSIS — Z882 Allergy status to sulfonamides status: Secondary | ICD-10-CM

## 2021-12-15 DIAGNOSIS — N179 Acute kidney failure, unspecified: Secondary | ICD-10-CM | POA: Diagnosis present

## 2021-12-15 DIAGNOSIS — R778 Other specified abnormalities of plasma proteins: Secondary | ICD-10-CM | POA: Diagnosis not present

## 2021-12-15 DIAGNOSIS — Z79899 Other long term (current) drug therapy: Secondary | ICD-10-CM

## 2021-12-15 DIAGNOSIS — Z79890 Hormone replacement therapy (postmenopausal): Secondary | ICD-10-CM

## 2021-12-15 LAB — BETA-HYDROXYBUTYRIC ACID
Beta-Hydroxybutyric Acid: 4.79 mmol/L — ABNORMAL HIGH (ref 0.05–0.27)
Beta-Hydroxybutyric Acid: 5.27 mmol/L — ABNORMAL HIGH (ref 0.05–0.27)

## 2021-12-15 LAB — CBC WITH DIFFERENTIAL/PLATELET
Abs Immature Granulocytes: 0.14 10*3/uL — ABNORMAL HIGH (ref 0.00–0.07)
Basophils Absolute: 0 10*3/uL (ref 0.0–0.1)
Basophils Relative: 0 %
Eosinophils Absolute: 0 10*3/uL (ref 0.0–0.5)
Eosinophils Relative: 0 %
HCT: 33.3 % — ABNORMAL LOW (ref 36.0–46.0)
Hemoglobin: 10.4 g/dL — ABNORMAL LOW (ref 12.0–15.0)
Immature Granulocytes: 1 %
Lymphocytes Relative: 7 %
Lymphs Abs: 1.1 10*3/uL (ref 0.7–4.0)
MCH: 30.5 pg (ref 26.0–34.0)
MCHC: 31.2 g/dL (ref 30.0–36.0)
MCV: 97.7 fL (ref 80.0–100.0)
Monocytes Absolute: 0.8 10*3/uL (ref 0.1–1.0)
Monocytes Relative: 5 %
Neutro Abs: 13.3 10*3/uL — ABNORMAL HIGH (ref 1.7–7.7)
Neutrophils Relative %: 87 %
Platelets: 97 10*3/uL — ABNORMAL LOW (ref 150–400)
RBC: 3.41 MIL/uL — ABNORMAL LOW (ref 3.87–5.11)
RDW: 13 % (ref 11.5–15.5)
WBC: 15.5 10*3/uL — ABNORMAL HIGH (ref 4.0–10.5)
nRBC: 0 % (ref 0.0–0.2)

## 2021-12-15 LAB — BASIC METABOLIC PANEL
Anion gap: 21 — ABNORMAL HIGH (ref 5–15)
Anion gap: 18 — ABNORMAL HIGH (ref 5–15)
Anion gap: 17 — ABNORMAL HIGH (ref 5–15)
BUN: 43 mg/dL — ABNORMAL HIGH (ref 8–23)
BUN: 44 mg/dL — ABNORMAL HIGH (ref 8–23)
BUN: 47 mg/dL — ABNORMAL HIGH (ref 8–23)
CO2: 16 mmol/L — ABNORMAL LOW (ref 22–32)
CO2: 15 mmol/L — ABNORMAL LOW (ref 22–32)
CO2: 16 mmol/L — ABNORMAL LOW (ref 22–32)
Calcium: 8.7 mg/dL — ABNORMAL LOW (ref 8.9–10.3)
Calcium: 7.9 mg/dL — ABNORMAL LOW (ref 8.9–10.3)
Calcium: 7.9 mg/dL — ABNORMAL LOW (ref 8.9–10.3)
Chloride: 91 mmol/L — ABNORMAL LOW (ref 98–111)
Chloride: 95 mmol/L — ABNORMAL LOW (ref 98–111)
Chloride: 98 mmol/L (ref 98–111)
Creatinine, Ser: 1.67 mg/dL — ABNORMAL HIGH (ref 0.44–1.00)
Creatinine, Ser: 1.62 mg/dL — ABNORMAL HIGH (ref 0.44–1.00)
Creatinine, Ser: 1.75 mg/dL — ABNORMAL HIGH (ref 0.44–1.00)
GFR, Estimated: 30 mL/min — ABNORMAL LOW (ref >60)
GFR, Estimated: 31 mL/min — ABNORMAL LOW (ref >60)
GFR, Estimated: 28 mL/min — ABNORMAL LOW (ref >60)
Glucose, Bld: 825 mg/dL (ref 70–99)
Glucose, Bld: 819 mg/dL (ref 70–99)
Glucose, Bld: 701 mg/dL (ref 70–99)
Potassium: 5.8 mmol/L — ABNORMAL HIGH (ref 3.5–5.1)
Potassium: 5.1 mmol/L (ref 3.5–5.1)
Potassium: 3.9 mmol/L (ref 3.5–5.1)
Sodium: 128 mmol/L — ABNORMAL LOW (ref 135–145)
Sodium: 128 mmol/L — ABNORMAL LOW (ref 135–145)
Sodium: 131 mmol/L — ABNORMAL LOW (ref 135–145)

## 2021-12-15 LAB — CBG MONITORING, ED
Glucose-Capillary: 208 mg/dL — ABNORMAL HIGH (ref 70–99)
Glucose-Capillary: 367 mg/dL — ABNORMAL HIGH (ref 70–99)
Glucose-Capillary: 427 mg/dL — ABNORMAL HIGH (ref 70–99)
Glucose-Capillary: 478 mg/dL — ABNORMAL HIGH (ref 70–99)
Glucose-Capillary: 511 mg/dL (ref 70–99)
Glucose-Capillary: >600 mg/dL (ref 70–99)
Glucose-Capillary: >600 mg/dL (ref 70–99)
Glucose-Capillary: >600 mg/dL (ref 70–99)

## 2021-12-15 LAB — BLOOD GAS, VENOUS
Acid-base deficit: 12.8 mmol/L — ABNORMAL HIGH (ref 0.0–2.0)
Bicarbonate: 14.9 mmol/L — ABNORMAL LOW (ref 20.0–28.0)
O2 Saturation: 62.8 %
Patient temperature: 37
pCO2, Ven: 40 mmHg — ABNORMAL LOW (ref 44.0–60.0)
pH, Ven: 7.18 — CL (ref 7.250–7.430)
pO2, Ven: 42 mmHg (ref 32.0–45.0)

## 2021-12-15 LAB — TROPONIN I (HIGH SENSITIVITY)
Troponin I (High Sensitivity): 69 ng/L — ABNORMAL HIGH (ref <18)
Troponin I (High Sensitivity): 82 ng/L — ABNORMAL HIGH (ref <18)

## 2021-12-15 LAB — TSH: TSH: 0.087 u[IU]/mL — ABNORMAL LOW (ref 0.350–4.500)

## 2021-12-15 LAB — RESP PANEL BY RT-PCR (FLU A&B, COVID) ARPGX2
Influenza A by PCR: NEGATIVE
Influenza B by PCR: NEGATIVE
SARS Coronavirus 2 by RT PCR: NEGATIVE

## 2021-12-15 MED ORDER — LACTATED RINGERS IV SOLN: INTRAVENOUS | Status: DC

## 2021-12-15 MED ORDER — DEXTROSE IN LACTATED RINGERS 5 % IV SOLN: INTRAVENOUS | Status: DC

## 2021-12-15 MED ORDER — POTASSIUM CHLORIDE IN NACL 20-0.45 MEQ/L-% IV SOLN
INTRAVENOUS | Status: DC
  Filled 2021-12-15 (×9): qty 1000

## 2021-12-15 MED ORDER — ATORVASTATIN CALCIUM 20 MG PO TABS
20.0000 mg | ORAL_TABLET | Freq: Every day | ORAL | Status: DC
  Administered 2021-12-16: 20 mg via ORAL
  Filled 2021-12-15: qty 1

## 2021-12-15 MED ORDER — INSULIN REGULAR(HUMAN) IN NACL 100-0.9 UT/100ML-% IV SOLN
INTRAVENOUS | Status: DC
  Administered 2021-12-15: 6.5 [IU]/h via INTRAVENOUS
  Filled 2021-12-15: qty 100

## 2021-12-15 MED ORDER — HEPARIN SODIUM (PORCINE) 5000 UNIT/ML IJ SOLN
5000.0000 [IU] | Freq: Three times a day (TID) | INTRAMUSCULAR | Status: DC
  Administered 2021-12-16 – 2021-12-18 (×7): 5000 [IU] via SUBCUTANEOUS
  Filled 2021-12-15 (×7): qty 1

## 2021-12-15 MED ORDER — MIDODRINE HCL 5 MG PO TABS
5.0000 mg | ORAL_TABLET | Freq: Once | ORAL | Status: AC
  Administered 2021-12-15: 5 mg via ORAL

## 2021-12-15 MED ORDER — INSULIN REGULAR(HUMAN) IN NACL 100-0.9 UT/100ML-% IV SOLN
INTRAVENOUS | Status: DC
  Administered 2021-12-15: 6.5 [IU]/h via INTRAVENOUS
  Filled 2021-12-15 (×2): qty 100

## 2021-12-15 MED ORDER — LACTATED RINGERS IV BOLUS: 20.0000 mL/kg | Freq: Once | INTRAVENOUS | Status: DC

## 2021-12-15 MED ORDER — DEXTROSE 50 % IV SOLN: 0.0000 mL | INTRAVENOUS | Status: DC | PRN

## 2021-12-15 MED ORDER — SODIUM CHLORIDE 0.9 % IV BOLUS
500.0000 mL | Freq: Once | INTRAVENOUS | Status: AC
  Administered 2021-12-16: 500 mL via INTRAVENOUS

## 2021-12-15 MED ORDER — SODIUM CHLORIDE 0.9 % IV BOLUS
1000.0000 mL | Freq: Once | INTRAVENOUS | Status: AC
  Administered 2021-12-15: 1000 mL via INTRAVENOUS

## 2021-12-15 MED ORDER — SODIUM CHLORIDE 0.45 % IV SOLN: INTRAVENOUS | Status: DC

## 2021-12-15 MED ORDER — ONDANSETRON HCL 4 MG/2ML IJ SOLN
4.0000 mg | Freq: Once | INTRAMUSCULAR | Status: AC
  Administered 2021-12-15: 4 mg via INTRAVENOUS
  Filled 2021-12-15: qty 2

## 2021-12-15 MED ORDER — LEVOTHYROXINE SODIUM 50 MCG PO TABS
50.0000 ug | ORAL_TABLET | Freq: Every day | ORAL | Status: DC
  Administered 2021-12-16 – 2021-12-19 (×4): 50 ug via ORAL
  Filled 2021-12-15 (×4): qty 1

## 2021-12-15 NOTE — H&P (Signed)
History and Physical    Madeline [name] WUJ:[id number] DOB: 11/24/1933 DOA: 12/15/2021  PCP: Marguarite ArbourSparks, Jeffrey D, MD  Patient coming from: home   Chief Complaint: confusion, hyperglycemia  HPI: Madeline NimrodShirley S [name] is a 88 y.o. female with medical history significant for t1dm on insulin pump, cad s/p stemi in 2012, high degree heart block w/ ppm, itp, presneting with the above.  Patient is confused and so is patient's husband. She reports problems with her insulin pump. Presented to the ED last night for hyperglycemia, wasn't in dka so was discharged. Presents today confused, with severe hyperglycemia to the 800s with acidosis. Patient denies cough. Has some nausea, no abdominal pain or diarrhea. Denies dysuria.    Review of Systems: As per HPI otherwise 10 point review of systems negative.    Past Medical History:  Diagnosis Date   Bradycardia    Coronary artery disease    Diabetes mellitus    Dyslipidemia    GERD (gastroesophageal reflux disease)    Hypothyroid    Presence of permanent cardiac pacemaker 12/16/2018   Medtronic dual lead pacemaker    Past Surgical History:  Procedure Laterality Date   APPENDECTOMY     BREAST BIOPSY Right 1967   EXCISIONAL - NEG   CHOLECYSTECTOMY     CORONARY STENT INTERVENTION     INSERT / REPLACE / REMOVE PACEMAKER  12/16/2018   no surgical hx     PACEMAKER IMPLANT N/A 12/16/2018   Procedure: PACEMAKER IMPLANT;  Surgeon: Marinus Mawaylor, Gregg W, MD;  Location: [facility] INVASIVE CV LAB;  Service: Cardiovascular;  Laterality: N/A;     reports that she has been smoking cigarettes. She has a 50.00 pack-year smoking history. She has never used smokeless tobacco. She reports that she does not drink alcohol and does not use drugs.  Allergies  Allergen Reactions   Darvon     "FEEL SICK"   Propoxyphene Nausea Only and Nausea And Vomiting   Sulfa Antibiotics Nausea Only and Nausea And Vomiting    "FEEL SICK"     Family History  Problem Relation Age of Onset    Heart Problems Mother    Other Father        UNK   Cancer Sister        BREAST   Breast cancer Sister [phone]   Cancer Sister        BREAST   Breast cancer Sister [id number]   Cancer Sister        BREAST   Diabetes Son     Prior to Admission medications   Medication Sig Start Date End Date Taking? Authorizing Provider  acetaminophen (TYLENOL) 325 MG tablet Take 1-2 tablets (325-650 mg total) by mouth every 6 (six) hours as needed for mild pain or moderate pain. 12/17/18   Barrett, Joline Salthonda G, PA-C  atorvastatin (LIPITOR) 20 MG tablet Take 1 tablet (20 mg total) by mouth daily at 6 PM. 12/09/20   Pricilla Riffleoss, Paula V, MD  cetirizine (ZYRTEC) 10 MG tablet Take 10 mg by mouth daily.    [provider]  citalopram (CELEXA) 20 MG tablet Take 20 mg by mouth daily.    [provider]  CONTOUR NEXT TEST test strip USE 1 STRIP TO CHECK BLOOD SUGAR EIGHT TO NINE TIMES DAILY. 03/27/19   [provider]  Insulin Human (INSULIN PUMP) SOLN Inject 1 each into the skin continuous. Used with Novolog insulin    [provider]  Lancets 28G MISC Use as instructed. 7-8 times  daily. 06/02/19   [provider]  levothyroxine (SYNTHROID, LEVOTHROID) 50 MCG tablet Take 50 mcg by mouth daily before breakfast.  05/16/18   [provider]  lisinopril (PRINIVIL,ZESTRIL) 20 MG tablet Take 1 tablet (20 mg total) by mouth daily. 09/22/18 12/14/21  Houston Siren, MD  Multiple Vitamin (MULTIVITAMIN WITH MINERALS) TABS tablet Take 1 tablet by mouth daily.    [provider]  NOVOLOG 100 UNIT/ML injection Inject 0-50 Units into the skin continuous. Up to 50 units/ day via pump 12/14/21   Sharman Cheek, MD    Physical Exam: Vitals:   12/15/21 1610 12/15/21 1620 12/15/21 1630 12/15/21 1640  BP: (!) 102/35 (!) 93/47 100/68 (!) 100/35  Pulse: 66 66 79 62  Resp: (!) 22 (!) 22 (!) 22 19  Temp:      TempSrc:      SpO2: 98% 100% 94% 98%  Weight:      Height:         Constitutional: chronically ill appearing Head: Atraumatic Eyes: Conjunctiva clear ENM: Moist mucous membranes. Normal dentition.  Neck: Supple Respiratory: Clear to auscultation bilaterally, no wheezing/rales/rhonchi. Normal respiratory effort. No accessory muscle use. . Cardiovascular: Regular rate and rhythm. No murmurs/rubs/gallops. Abdomen: Non-tender, non-distended. No masses. No rebound or guarding. Positive bowel sounds. Musculoskeletal: No joint deformity upper and lower extremities. Normal ROM, no contractures. Normal muscle tone.  Skin: No rashes, lesions, or ulcers.  Extremities: No peripheral edema. Palpable peripheral pulses. Neurologic: moving all 4 Psychiatric: confused   Labs on Admission: I have personally reviewed following labs and imaging studies  CBC: Recent Labs  Lab 12/14/21 1912 12/15/21 1353 12/15/21 1415  WBC 7.5 16.1* 15.5*  NEUTROABS  --   --  13.3*  HGB 12.7 10.4* 10.4*  HCT 39.7 34.6* 33.3*  MCV 94.5 98.3 97.7  PLT 66* 99* 97*   Basic Metabolic Panel: Recent Labs  Lab 12/14/21 1912 12/15/21 1415  NA 135 128*  K 5.1 5.8*  CL 99 91*  CO2 26 16*  GLUCOSE 421* 825*  BUN 15 43*  CREATININE 0.89 1.67*  CALCIUM 9.0 8.7*   GFR: Estimated Creatinine Clearance: 19.5 mL/min (A) (by C-G formula based on SCr of 1.67 mg/dL (H)). Liver Function Tests: Recent Labs  Lab 12/14/21 1912  AST 28  ALT 20  ALKPHOS 89  BILITOT 0.8  PROT 6.2*  ALBUMIN 3.7   No results for input(s): LIPASE, AMYLASE in the last 168 hours. No results for input(s): AMMONIA in the last 168 hours. Coagulation Profile: No results for input(s): INR, PROTIME in the last 168 hours. Cardiac Enzymes: No results for input(s): CKTOTAL, CKMB, CKMBINDEX, TROPONINI in the last 168 hours. BNP (last 3 results) No results for input(s): PROBNP in the last 8760 hours. HbA1C: No results for input(s): HGBA1C in the last 72 hours. CBG: Recent Labs  Lab 12/14/21 2239  12/15/21 0004 12/15/21 1351 12/15/21 1608  GLUCAP 317* 208* >600* >600*   Lipid Profile: No results for input(s): CHOL, HDL, LDLCALC, TRIG, CHOLHDL, LDLDIRECT in the last 72 hours. Thyroid Function Tests: No results for input(s): TSH, T4TOTAL, FREET4, T3FREE, THYROIDAB in the last 72 hours. Anemia Panel: No results for input(s): VITAMINB12, FOLATE, FERRITIN, TIBC, IRON, RETICCTPCT in the last 72 hours. Urine analysis:    Component Value Date/Time   COLORURINE YELLOW 12/14/2021 2132   APPEARANCEUR CLEAR 12/14/2021 2132   LABSPEC 1.010 12/14/2021 2132   PHURINE 5.0 12/14/2021 2132   GLUCOSEU 500 (A) 12/14/2021 2132   HGBUR  TRACE (A) 12/14/2021 2132   BILIRUBINUR NEGATIVE 12/14/2021 2132   KETONESUR >160 (A) 12/14/2021 2132   PROTEINUR NEGATIVE 12/14/2021 2132   NITRITE NEGATIVE 12/14/2021 2132   LEUKOCYTESUR NEGATIVE 12/14/2021 2132    Radiological Exams on Admission: CT HEAD WO CONTRAST ( )  Result Date: 12/15/2021 CLINICAL DATA:  Sudden severe headache EXAM: CT HEAD WITHOUT CONTRAST TECHNIQUE: Contiguous axial images were obtained from the base of the skull through the vertex without intravenous contrast. RADIATION DOSE REDUCTION: This exam was performed according to the departmental dose-optimization program which includes automated exposure control, adjustment of the mA and/or kV according to patient size and/or use of iterative reconstruction technique. COMPARISON:  CT head dated September 06, 2012 FINDINGS: Brain: No evidence of acute infarction, hemorrhage, hydrocephalus, extra-axial collection or mass lesion/mass effect. Low attenuation of periventricular and subcortical white matter presumed chronic microvascular ischemic changes. Minimal scattered bilateral dystrophic basal ganglia calcifications. Vascular: No hyperdense vessel or unexpected calcification. Skull: Normal. Negative for fracture or focal lesion. Sinuses/Orbits: No acute finding. Other: None IMPRESSION: 1.  No  acute intracranial abnormality. 2.  Mild chronic microvascular ischemic changes of the white matter. Electronically Signed   By: Larose Hires D.O.   On: 12/15/2021 14:47    EKG: Independently reviewed. No overt ischemic changes  Assessment/Plan Principal Problem:   DKA (diabetic ketoacidosis) (HCC) Active Problems:   CAD (coronary artery disease)   Chronic ITP (idiopathic thrombocytopenia) (HCC)   Complete heart block (HCC)   Hypothyroidism   # DKA Here confused, with glucose in the 800s, vgb ph 7.18. has received 2 L fluid resuscitation in the ED. Appears to be 2/2 problems with insulin pump, or running out of insulin - step-down status - continue IVF 0.45% ns @ 175 - cont insulin gtt per endotool - bmp q4, beta-h q8 - f/u repeat ua with culture, cxr - npo - dm educator consult  # Acute encephalopathy 2/2 dka. CT head w/o acute findings - monitor  # Aki Cr 1.67, baseline is normal. 2/2 dehydration from dka - fluid resuscitation as above  # Hyperkalemia 2/2 dka. K 5.8. likely underlying hypokalemia - insulin/fluids as above - monitor as above - tele  # CAD # Elevated troponin Hx nstemi, has DES. Mild trop elevation on presentation to 69, no overt ischemia seen on ekg and patient denies chest pain - home statin  # HTN # MDD F/u home meds after med rec performed  # Hypothyroid - f/u tsh  # ITP, chronic Plts 97, at baseline - monitor  DVT prophylaxis: heparin Code Status: full  Family Communication:  husband updated @ bedside  Consults called: none   Level of care: Stepdown Status is: Inpatient Remains inpatient appropriate because: severity of illness  Planned Discharge Destination: tbd          Silvano Bilis MD [facility] Pager [phone]  If 7PM-7AM, please contact night-coverage [email or web address] Password [email or web address]  12/15/2021, 4:50 PM

## 2021-12-15 NOTE — ED Triage Notes (Signed)
Pt via POV from home. Pt c/o headache and hyperglycemia. Pt states that she was here last night. Pt checked her sugar this AM and it was 150s. Pt is A&OX4 and NAD. Pt is HOH.

## 2021-12-15 NOTE — ED Provider Notes (Signed)
[facility] Provider Note    Event Date/Time   First MD Initiated Contact with Patient 12/15/21 1414     (approximate)   History   Hyperglycemia   HPI  Madeline [name] is a 88 y.o. female with diabetes, heart block, coronary disease who comes in with concerns for elevated blood sugars.  Patient reports that her insulin pump has run out 2 days ago.  Patient was here yesterday for hyperglycemia but was not in DKA and they gave her refills for her insulin pump given there was no DKA patient was discharged.  Patient is not able to tell me exactly what is going on.  She reported a headache in triage and her sugars are significantly elevated here but I am able to get full HPI  Triage Vital Signs: ED Triage Vitals [12/15/21 1345]  Enc Vitals Group     BP (!) 107/49     Pulse Rate 92     Resp 20     Temp 98.1 F (36.7 C)     Temp Source Oral     SpO2 98 %     Weight 115 lb (52.2 kg)     Height 5\' 2"  (1.575 m)     Head Circumference      Peak Flow      Pain Score 10     Pain Loc      Pain Edu?      Excl. in GC?     Most recent vital signs: Vitals:   12/15/21 1345  BP: (!) 107/49  Pulse: 92  Resp: 20  Temp: 98.1 F (36.7 C)  SpO2: 98%     General: Patient is sleepy but able to wake up and squeeze my hand CV:  Good peripheral perfusion.  Resp:  Normal effort.  Abd:  No distention.  Nontender    ED Results / Procedures / Treatments   Labs (all labs ordered are listed, but only abnormal results are displayed) Labs Reviewed  CBG MONITORING, ED - Abnormal; Notable for the following components:      Result Value   Glucose-Capillary >600 (*)    All other components within normal limits  BASIC METABOLIC PANEL  CBC  CBG MONITORING, ED     EKG  My interpretation of EKG:  Patient with some ST depression 2 3 aVF with what looks like a sinus rhythm, QTC may be a little elonged.    RADIOLOGY I have reviewed theCT  personally and  agree with radiology read ct head negative   PROCEDURES:  Critical Care performed: Yes, see critical care procedure note(s)  .1-3 Lead EKG Interpretation Performed by: 12/17/21, MD Authorized by: Concha Se, MD     Interpretation: normal     ECG rate:  90   ECG rate assessment: normal     Rhythm: sinus rhythm     Ectopy: none     Conduction: normal   .Critical Care Performed by: Concha Se, MD Authorized by: Concha Se, MD   Critical care provider statement:    Critical care time (minutes):  30   Critical care was necessary to treat or prevent imminent or life-threatening deterioration of the following conditions:  Endocrine crisis   Critical care was time spent personally by me on the following activities:  Development of treatment plan with patient or surrogate, discussions with consultants, evaluation of patient's response to treatment, examination of patient, ordering and review of laboratory studies, ordering  and review of radiographic studies, ordering and performing treatments and interventions, pulse oximetry, re-evaluation of patient's condition and review of old charts   MEDICATIONS ORDERED IN ED: Medications  lactated ringers bolus 1,044 mL (has no administration in time range)  insulin regular, human (MYXREDLIN) 100 units/ 100 mL infusion (has no administration in time range)  lactated ringers infusion (has no administration in time range)  dextrose 5 % in lactated ringers infusion (has no administration in time range)  dextrose 50 % solution 0-50 mL (has no administration in time range)  sodium chloride 0.9 % bolus 1,000 mL (1,000 mLs Intravenous New Bag/Given 12/15/21 1429)  ondansetron (ZOFRAN) injection 4 mg (4 mg Intravenous Given 12/15/21 1520)     IMPRESSION / MDM / ASSESSMENT AND PLAN / ED COURSE  I reviewed the triage vital signs and the nursing notes.                             Patient with diabetes who comes in with concerns for some  confusion and possible headaches and elevated sugars Differential diagnosis includes, but is not limited to DKA, dehydration, AKI, COVID, flu.  Will get CT head evaluate for intercranial hemorrhage  CT head negative  Initial point-of-care glucose showed greater than 600 however when we got initial blood work are not sure where that came from because it was being reported always normal.  I am concerned that this blood work was not from her.  We did ultrasound-guided IV and got additional blood work that I think is the right blood work.  Point-of-care glucose greater than 600 BMP glucose significantly elevated with anion gap CBC shows stable hemoglobin and white count slightly elevated but no other infectious symptoms Patient's EKG did look a lot a little abnormal so added on some troponins  Patient's labs consistent with DKA will start on insulin.  Given K is fine we will hold off on potassium.  Will discuss with hospital team for admission.  Patient did have some vomiting so given some IV Zofran and IV fluids   The patient is on the cardiac monitor to evaluate for evidence of arrhythmia and/or significant heart rate changes.   FINAL CLINICAL IMPRESSION(S) / ED DIAGNOSES   Final diagnoses:  Diabetic ketoacidosis without coma associated with type 1 diabetes mellitus (HCC)     Rx / DC Orders   ED Discharge Orders     None        Note:  This document was prepared using Dragon voice recognition software and may include unintentional dictation errors.   Concha Se, MD 12/15/21 [phone]

## 2021-12-15 NOTE — Progress Notes (Signed)
Inpatient Diabetes Program Recommendations  AACE/ADA: New Consensus Statement on Inpatient Glycemic Control (2015)  Target Ranges:  Prepandial:   less than 140 mg/dL      Peak postprandial:   less than 180 mg/dL (1-2 hours)      Critically ill patients:  140 - 180 mg/dL    Latest Reference Range & Units 12/15/21 13:51  Glucose-Capillary 70 - 99 mg/dL >600 (HH)     To ED with Headache and Hyperglycemia Was in the ED last PM for same issues--Told ED MD that she ran out of Insulin for her Insulin Pump and did not have any refills--Unsure why she did not contact ENDO  Back to ED this afternoon--Labs pending--Pt actively vomiting in the ED  Type 1 diabetes  Home: Insulin Pump  Freestyle Libre 2 CGM   I met with pt down in the ED this afternoon.  She stated to me she was not wearing her insulin pump--stated it was in her purse with her husband who was in a chair next to her.  I felt around her abdomen but did not see or feel the pump.  I asked husband to look in pt's purse but we could not find the pump.  Pt told me the pump has been OFF for days.  Given this info I suspect pt is currently in DKA (CBG >600, vomiting).  RN told me IVF are running.  Labs pending.  Asked RN to please make sure to take pt's shirt/ and or/ pants off and look for her insulin pump prior to starting the IV Insulin Drip (if IV Insulin is ordered).  Pump needs to be removed while IV Insulin Drip running.   Endocrinologist: Dr. Gabriel Carina with [facility]--last seen 10/07/2021 Pump settings at that visit were as follows:  Basal settings 12 AM 0.45 units/hr 6 am 0.925 units/hr 8 am 0.6 units/hr 5 pm 0.7 units/hr 24-hr basal = 14.85 units NovoLog   Bolus settings I:C 12AM 15, 11AM 12 Sensitivity 65 Target 100-120 Active insulin time 5 hrs    --Will follow patient during hospitalization--  Wyn Quaker RN, MSN, CDE Diabetes Coordinator Inpatient Glycemic Control Team Team Pager: [phone] (8a-5p)

## 2021-12-15 NOTE — ED Provider Notes (Signed)
12:15 AM  Assumed care at shift change.  Patient here with hyperglycemia but not in DKA.  Received IV fluids, insulin.  Blood sugar has improved to the 200s.  She has no acute complaints at this time.  Refill given for insulin for her insulin pump.  Recommended increase fluid intake at home as her urine does show large ketones but no sign of infection.  Normal bicarb and anion gap today.  Patient comfortable with plan as is family.  Does not need admission at this time given blood sugar is under control and she is not in DKA.   At this time, I do not feel there is any life-threatening condition present. I reviewed all nursing notes, vitals, pertinent previous records.  All lab and urine results, EKGs, imaging ordered have been independently reviewed and interpreted by myself.  I reviewed all available radiology reports from any imaging ordered this visit.  Based on my assessment, I feel the patient is safe to be discharged home without further emergent workup and can continue workup as an outpatient as needed. Discussed all findings, treatment plan as well as usual and customary return precautions with patient and family.  They verbalize understanding and are comfortable with this plan.  Outpatient follow-up has been provided as needed.  All questions have been answered.    Damari Hiltz, Delice Bison, DO 12/15/21 [phone]

## 2021-12-15 NOTE — ED Notes (Signed)
Pt more aware and lifted HOB and assisted pt to take Midodrine with water with straw due to hypotension. Pt able to swallow well

## 2021-12-15 NOTE — ED Notes (Signed)
Unable to draw CBG previous hour due to managing hypotension and messaging provider

## 2021-12-16 DIAGNOSIS — G9341 Metabolic encephalopathy: Secondary | ICD-10-CM | POA: Diagnosis present

## 2021-12-16 DIAGNOSIS — F32A Depression, unspecified: Secondary | ICD-10-CM | POA: Diagnosis present

## 2021-12-16 DIAGNOSIS — N179 Acute kidney failure, unspecified: Secondary | ICD-10-CM

## 2021-12-16 DIAGNOSIS — I959 Hypotension, unspecified: Secondary | ICD-10-CM | POA: Diagnosis present

## 2021-12-16 DIAGNOSIS — E1010 Type 1 diabetes mellitus with ketoacidosis without coma: Principal | ICD-10-CM

## 2021-12-16 DIAGNOSIS — E875 Hyperkalemia: Secondary | ICD-10-CM | POA: Diagnosis present

## 2021-12-16 DIAGNOSIS — R778 Other specified abnormalities of plasma proteins: Secondary | ICD-10-CM

## 2021-12-16 DIAGNOSIS — I9589 Other hypotension: Secondary | ICD-10-CM

## 2021-12-16 DIAGNOSIS — E861 Hypovolemia: Secondary | ICD-10-CM

## 2021-12-16 LAB — BASIC METABOLIC PANEL
Anion gap: 8 (ref 5–15)
Anion gap: 7 (ref 5–15)
BUN: 50 mg/dL — ABNORMAL HIGH (ref 8–23)
BUN: 51 mg/dL — ABNORMAL HIGH (ref 8–23)
CO2: 21 mmol/L — ABNORMAL LOW (ref 22–32)
CO2: 22 mmol/L (ref 22–32)
Calcium: 7.7 mg/dL — ABNORMAL LOW (ref 8.9–10.3)
Calcium: 7.9 mg/dL — ABNORMAL LOW (ref 8.9–10.3)
Chloride: 106 mmol/L (ref 98–111)
Chloride: 107 mmol/L (ref 98–111)
Creatinine, Ser: 1.7 mg/dL — ABNORMAL HIGH (ref 0.44–1.00)
Creatinine, Ser: 1.5 mg/dL — ABNORMAL HIGH (ref 0.44–1.00)
GFR, Estimated: 29 mL/min — ABNORMAL LOW (ref >60)
GFR, Estimated: 34 mL/min — ABNORMAL LOW (ref >60)
Glucose, Bld: 267 mg/dL — ABNORMAL HIGH (ref 70–99)
Glucose, Bld: 173 mg/dL — ABNORMAL HIGH (ref 70–99)
Potassium: 3.8 mmol/L (ref 3.5–5.1)
Potassium: 4.3 mmol/L (ref 3.5–5.1)
Sodium: 135 mmol/L (ref 135–145)
Sodium: 136 mmol/L (ref 135–145)

## 2021-12-16 LAB — GLUCOSE, CAPILLARY
Glucose-Capillary: 162 mg/dL — ABNORMAL HIGH (ref 70–99)
Glucose-Capillary: 186 mg/dL — ABNORMAL HIGH (ref 70–99)
Glucose-Capillary: 186 mg/dL — ABNORMAL HIGH (ref 70–99)
Glucose-Capillary: 186 mg/dL — ABNORMAL HIGH (ref 70–99)
Glucose-Capillary: 173 mg/dL — ABNORMAL HIGH (ref 70–99)
Glucose-Capillary: 176 mg/dL — ABNORMAL HIGH (ref 70–99)
Glucose-Capillary: 185 mg/dL — ABNORMAL HIGH (ref 70–99)
Glucose-Capillary: 188 mg/dL — ABNORMAL HIGH (ref 70–99)
Glucose-Capillary: 178 mg/dL — ABNORMAL HIGH (ref 70–99)
Glucose-Capillary: 203 mg/dL — ABNORMAL HIGH (ref 70–99)
Glucose-Capillary: 180 mg/dL — ABNORMAL HIGH (ref 70–99)

## 2021-12-16 LAB — MAGNESIUM: Magnesium: 2 mg/dL (ref 1.7–2.4)

## 2021-12-16 LAB — BLOOD GAS, VENOUS
Acid-base deficit: 3 mmol/L — ABNORMAL HIGH (ref 0.0–2.0)
Bicarbonate: 22 mmol/L (ref 20.0–28.0)
O2 Saturation: 90.6 %
Patient temperature: 37
pCO2, Ven: 38 mmHg — ABNORMAL LOW (ref 44.0–60.0)
pH, Ven: 7.37 (ref 7.250–7.430)
pO2, Ven: 62 mmHg — ABNORMAL HIGH (ref 32.0–45.0)

## 2021-12-16 LAB — TROPONIN I (HIGH SENSITIVITY)
Troponin I (High Sensitivity): 636 ng/L (ref <18)
Troponin I (High Sensitivity): 634 ng/L (ref <18)
Troponin I (High Sensitivity): 595 ng/L (ref <18)

## 2021-12-16 LAB — CBG MONITORING, ED
Glucose-Capillary: 257 mg/dL — ABNORMAL HIGH (ref 70–99)
Glucose-Capillary: 202 mg/dL — ABNORMAL HIGH (ref 70–99)

## 2021-12-16 LAB — PHOSPHORUS: Phosphorus: 5.8 mg/dL — ABNORMAL HIGH (ref 2.5–4.6)

## 2021-12-16 LAB — BETA-HYDROXYBUTYRIC ACID
Beta-Hydroxybutyric Acid: 0.09 mmol/L (ref 0.05–0.27)
Beta-Hydroxybutyric Acid: 0.38 mmol/L — ABNORMAL HIGH (ref 0.05–0.27)

## 2021-12-16 LAB — MRSA NEXT GEN BY PCR, NASAL: MRSA by PCR Next Gen: NOT DETECTED

## 2021-12-16 LAB — PROCALCITONIN
Procalcitonin: 5.72 ng/mL
Procalcitonin: 6.38 ng/mL

## 2021-12-16 LAB — BRAIN NATRIURETIC PEPTIDE: B Natriuretic Peptide: 968.7 pg/mL — ABNORMAL HIGH (ref 0.0–100.0)

## 2021-12-16 LAB — LACTIC ACID, PLASMA: Lactic Acid, Venous: 1.6 mmol/L (ref 0.5–1.9)

## 2021-12-16 LAB — CK: Total CK: 483 U/L — ABNORMAL HIGH (ref 38–234)

## 2021-12-16 MED ORDER — SODIUM CHLORIDE 0.9 % IV SOLN: 250.0000 mL | INTRAVENOUS | Status: DC

## 2021-12-16 MED ORDER — INSULIN ASPART 100 UNIT/ML IJ SOLN
0.0000 [IU] | Freq: Three times a day (TID) | INTRAMUSCULAR | Status: DC
  Administered 2021-12-16: 3 [IU] via SUBCUTANEOUS
  Administered 2021-12-16: 2 [IU] via SUBCUTANEOUS
  Filled 2021-12-16 (×2): qty 1

## 2021-12-16 MED ORDER — PANTOPRAZOLE SODIUM 40 MG IV SOLR: 40.0000 mg | INTRAVENOUS | Status: DC

## 2021-12-16 MED ORDER — NOREPINEPHRINE 4 MG/250ML-% IV SOLN
2.0000 ug/min | INTRAVENOUS | Status: DC
  Administered 2021-12-16: 3 ug/min via INTRAVENOUS
  Filled 2021-12-16: qty 250

## 2021-12-16 MED ORDER — SODIUM CHLORIDE 0.9 % IV SOLN: INTRAVENOUS | Status: DC

## 2021-12-16 MED ORDER — LACTATED RINGERS IV BOLUS
500.0000 mL | Freq: Once | INTRAVENOUS | Status: AC
  Administered 2021-12-16: 500 mL via INTRAVENOUS

## 2021-12-16 MED ORDER — CHLORHEXIDINE GLUCONATE CLOTH 2 % EX PADS
6.0000 | MEDICATED_PAD | Freq: Every day | CUTANEOUS | Status: DC
  Administered 2021-12-16 – 2021-12-19 (×4): 6 via TOPICAL

## 2021-12-16 MED ORDER — LACTATED RINGERS IV BOLUS
250.0000 mL | Freq: Once | INTRAVENOUS | Status: AC
  Administered 2021-12-16: 250 mL via INTRAVENOUS

## 2021-12-16 MED ORDER — INSULIN GLARGINE-YFGN 100 UNIT/ML ~~LOC~~ SOLN
14.0000 [IU] | Freq: Every day | SUBCUTANEOUS | Status: DC
  Administered 2021-12-16: 14 [IU] via SUBCUTANEOUS
  Filled 2021-12-16 (×2): qty 0.14

## 2021-12-16 MED ORDER — INSULIN ASPART 100 UNIT/ML IJ SOLN
3.0000 [IU] | Freq: Three times a day (TID) | INTRAMUSCULAR | Status: DC
  Filled 2021-12-16: qty 1

## 2021-12-16 MED ORDER — CITALOPRAM HYDROBROMIDE 20 MG PO TABS
20.0000 mg | ORAL_TABLET | Freq: Every day | ORAL | Status: DC
Start: 2021-12-16 — End: 2021-12-19
  Administered 2021-12-16 – 2021-12-19 (×4): 20 mg via ORAL
  Filled 2021-12-16 (×4): qty 1

## 2021-12-16 MED ORDER — LORATADINE 10 MG PO TABS
10.0000 mg | ORAL_TABLET | Freq: Every day | ORAL | Status: DC
  Administered 2021-12-16 – 2021-12-19 (×4): 10 mg via ORAL
  Filled 2021-12-16 (×4): qty 1

## 2021-12-16 MED ORDER — INSULIN ASPART 100 UNIT/ML IJ SOLN: 0.0000 [IU] | Freq: Every day | INTRAMUSCULAR | Status: DC

## 2021-12-16 MED ORDER — PANTOPRAZOLE SODIUM 40 MG PO TBEC
40.0000 mg | DELAYED_RELEASE_TABLET | Freq: Every day | ORAL | Status: DC
  Administered 2021-12-17 – 2021-12-19 (×3): 40 mg via ORAL
  Filled 2021-12-16 (×3): qty 1

## 2021-12-16 NOTE — Progress Notes (Signed)
An USGPIV (ultrasound guided PIV) has been placed for short-term vasopressor infusion. A correctly placed ivWatch must be used when administering Vasopressors. Should this treatment be needed beyond 72 hours, central line access should be obtained.  It will be the responsibility of the bedside nurse to follow best practice to prevent extravasations.   ?

## 2021-12-16 NOTE — Assessment & Plan Note (Signed)
Improved.  Required ICU for Levophed per PCCM.  Weaned off this AM. BP's improved and stable today. --Maintain MAP > 65 --Stable for transfer to floor

## 2021-12-16 NOTE — Assessment & Plan Note (Signed)
POA, now resolved.   Off insulin drip this morning.

## 2021-12-16 NOTE — Progress Notes (Signed)
Progress Note   Patient: Madeline [name] [id number] DOB: June 15, 1934 DOA: 12/15/2021     1 DOS: the patient was seen and examined on 12/16/2021   Brief hospital course: 88 y.o. female with medical history significant for type 1 diabetes on insulin pump, CAD s/p STEMI in 2012, high degree heart block w/ PPM, ITP, presented to the ED on evening of 12/14/2021 with confusion and hyperglycemia with glucose of 825 with DKA.  She had been seen in the ED the night before for hyperglycemia, wasn't in DKA at the time, so was discharged.  Reportedly family were unable to obtain pump supplies from pharmacy before they closed.    Admitted to medicine service on insulin drip. Subsequently developed hypotension requiring Levophed in ICU with PCCM consulted.   This morning, sugars controlled and transitioned from infusion to subcutaneous insulin until pump and supplies can be brought in to transition.  Hypotension improved and Levophed was weaned off this morning.  BP's stable.  Transfer to med/surg pending.   Assessment and Plan: * DKA (diabetic ketoacidosis) (HCC)- (present on admission) POA, now resolved.   Off insulin drip this morning.   Depression- (present on admission) Continue Celexa  Hyperkalemia- (present on admission) POA 2/2 dka. K 5.8.  Treated with insulin drip.  K this morning normalized at 4.3. Monitor BMP.   Elevated troponin- (present on admission) Likely demand ischemia with hypotension.  Patient has no chest pain or ischemic symptoms. Trend until declining. Monitor for s/sx's of ischemia and get stat EKG and repeat troponin.  AKI (acute kidney injury) (HCC)- (present on admission) POA with Cr 1.67, baseline is normal. 2/2 dehydration from DKA. Cr improving with fluids -- Monitor BMP  Acute metabolic encephalopathy- (present on admission) POA. Due to DKA and hypotension.  Resolved. --Delirium precautions  Hypotension- (present on admission) Improved.  Required ICU  for Levophed per PCCM.  Weaned off this AM. BP's improved and stable today. --Maintain MAP > 65 --Stable for transfer to floor  Hypothyroidism- (present on admission) Continue levothyroxine  Complete heart block (HCC)- (present on admission) S/p PPM.    Chronic ITP (idiopathic thrombocytopenia) (HCC)- (present on admission) Platelets near baseline. --Monitor CBC  CAD (coronary artery disease)- (present on admission) Stable.  No chest pain. Hx of NTEMI with stent. Mild troponin elevation likely demand ischemia in setting of hypotension and DKA --Continue statin        Subjective: Pt seen with ICU this AM with husband and daughter present.  She reports feeling okay, tired but offers no other acute complaints.  Her son is supposed to bring pump and supplies.  Denies any chest pain, SOB, fever, chills, cough, congestion, sore throat, abdomianl pain, N/V/D, or dysuria.  Has not felt sick recently.    Physical Exam: Vitals:   12/16/21 1600 12/16/21 1700 12/16/21 1800 12/16/21 1900  BP: (!) 147/58 (!) 148/57 (!) 139/57 (!) 119/108  Pulse: 78 81 78 81  Resp: (!) 23 19 (!) 21 (!) 29  Temp: 98.1 F (36.7 C)     TempSrc: Oral     SpO2: 96% 92% 93% 91%  Weight:      Height:       General exam: awake, alert, no acute distress HEENT: moist mucus membranes, hearing grossly normal  Respiratory system: CTAB, no wheezes, rales or rhonchi, normal respiratory effort. Cardiovascular system: normal S1/S2, RRR, no pedal edema.   Gastrointestinal system: soft, NT, ND, no HSM felt, +bowel sounds. Central nervous system: no gross focal neurologic deficits,  normal speech Extremities: moves all , no edema, normal tone Skin: dry, intact, normal temperature Psychiatry: normal mood, congruent affect, judgement and insight appear normal   Data Reviewed:  Labs reviewed and notable for troponin 69>>82>>636, CBG's recent 173>>176>>185 - at goal, Cr 1.50, CK 463, procal 6.38,     Family  Communication: husband and son at bedside on rounds  Disposition: Status is: Inpatient Remains inpatient appropriate because: severity of illness as outlined above.    Planned Discharge Destination: Home           Time spent: 35 minutes  Author: Pennie Banter, DO 12/16/2021 7:35 PM  For on call review [email or web address].

## 2021-12-16 NOTE — Assessment & Plan Note (Signed)
Likely demand ischemia with hypotension.  Patient has no chest pain or ischemic symptoms. Trend until declining. Monitor for s/sx's of ischemia and get stat EKG and repeat troponin.

## 2021-12-16 NOTE — Assessment & Plan Note (Signed)
S/p PPM 

## 2021-12-16 NOTE — Consult Note (Signed)
NAME:  Madeline [name], MRN:  [id number], DOB:  Nov 14, 1934, LOS: 1 ADMISSION DATE:  12/15/2021, CONSULTATION DATE:  12/16/21 REFERRING MD:  Morton Amy, NP, CHIEF COMPLAINT:  AMS & Hyperglycemia  History of Present Illness:  88 yo F presenting to [facility] ED from home on 12/14/21 with complaints that her insulin pump ran out 2 days ago & she did not have insulin refills with a blood glucose of > 500. At that time she was not in DKA, was given IVF & insulin. She was also provided a prescription for refill for her insulin pump & discharged from ED in the early morning of 12/15/21. About 12 hours later on 12/15/21 the patient returned to the ED with complaints of a headache and blood glucose in the 800's with DKA.   Per the patient's son who is bedside, there was an issue with the refill that was entered and the pharmacy would not dispense the insulin.  ED course: Patient treated for vomiting and was worked up for DKA and [facility] consulted for admission & diabetes coordinator consulted.  Medications given: zofran IV, NS bolus 1L, insulin drip and 0.45%NS with 20 meq of K+ started Initial Vitals: T-98.1, RR- 20, HR- Significant labs on admission: (Labs/ Imaging personally reviewed) Chemistry: Hyponatremic Na+:128, hyperkalemic K+: 5.8, AKI with BUN/Cr.: 43/1.67, AGMA with Serum CO2/ AG: 16/ 21,  hyperglycemic at 825, beta-hyrox: 4.79, TSH 0.087 Hematology: leukocytosis WBC: 15.5 with mild left shift, Hgb: 1.4,  Troponin: 69 > 82, COVID-19 & Influenza A/B: negative VBG: 7.18/ 40/ 42/ 14.9 UA 1/29: +trace glucose, >160 ketones, +rare bacteria (pending this admit) CXR 12/15/21: no active disease CT head wo contrast 12/15/21: no acute intracranial abnormality, mild microvascular ischemic changes  Overnight, patient became hypotensive receiving an additional 1.5 L NS & midodrine without effect. Lactic, BNP & repeat BMP pending. BP dropped into the 70's/30's. PCCM consulted due to vasopressor  administration.  Pertinent  Medical History  High degree HB s/p PPM T1DM on insulin pump CAD s/p STEMI 2012 HLD Hypothyroidism ITP  Significant Hospital Events: Including procedures, antibiotic start and stop dates in addition to other pertinent events   12/15/21- admit to [facility] for DKA, overnight became hypotensive requiring vasopressor administration- PCCM consulted  Interim History / Subjective:  Patient significantly HOH with husband and son bedside. She has no current complaints, hemodynamically stable on 3 mcg of levophed  Objective   Blood pressure (!) 95/40, pulse 90, temperature 98.6 F (37 C), temperature source Oral, resp. rate (!) 21, height 5\' 2"  (1.575 m), weight 52.2 kg, SpO2 96 %.        Intake/Output Summary (Last 24 hours) at 12/16/2021 0041 Last data filed at 12/15/2021 1558 Gross per 24 hour  Intake 2000 ml  Output --  Net 2000 ml   Filed Weights   12/15/21 1345  Weight: 52.2 kg    Examination: General: Adult female, critically ill, lying in bed, NAD HEENT: MM pink/dry, anicteric, atraumatic, neck supple Neuro: A&O x 4, able to follow commands, PERRL +3, MAE- generalized weakness, very HOH CV: s1s2 RRR, paced on monitor, +murmur, no r/g Pulm: Regular, non labored on room air, breath sounds clear-BUL & diminished-BLL GI: soft, rounded, non tender, bs x 4 Skin: limited exam- no rashes/lesions noted Extremities: warm/dry, pulses + 2 R/P, no edema noted  Resolved Hospital Problem list   Acute Encephalopathy Hyperkalemia  Assessment & Plan:  Circulatory Shock secondary to hypovolemia in the setting of DKA - continue peripheral vasopressors -  continue midodrine - continue aggressive IVF   Diabetic ketoacidosis  PMHx: T1DM with insulin pump No symptoms suggestive of occult infection. Pt unable to obtain insulin for pump - Continue DKA protocol - Continue Aggressive IV hydration, when blood glucose falls below 250 add D5 to IV fluids - Insulin drip  per Endo tool protocol - Strict I/O's: alert provider if UOP < 0.5 mL/kg/hr - Q 4 BMP, closely monitor potassium, replace electrolytes PRN - monitor blood glucose every 1 hour, per Endo tool protocol - STAT lactic, VBG, CK, trend serum CO2/ AG/ Lactic, f/u A1C - Diabetes coordinator following - consult TOC to assist with medication issues - Resume home insulin regiment once appropriate  Acute Kidney Injury secondary to hypovolemia in the setting of DKA Baseline Cr: 0.89, Cr on admission: 1.67 - Strict I/O's: alert provider if UOP < 0.5 mL/kg/hr - IVF hydration as above - Daily BMP, replace electrolytes PRN - Avoid nephrotoxic agents as able, ensure adequate renal perfusion   Best Practice (right click and "Reselect all SmartList Selections" daily)  Diet/type: NPO w/ oral meds DVT prophylaxis: prophylactic heparin  GI prophylaxis: PPI Lines: N/A Foley:  N/A Code Status:  full code Last date of multidisciplinary goals of care discussion [per primary]  Labs   CBC: Recent Labs  Lab 12/14/21 1912 12/15/21 1353 12/15/21 1415  WBC 7.5 16.1* 15.5*  NEUTROABS  --   --  13.3*  HGB 12.7 10.4* 10.4*  HCT 39.7 34.6* 33.3*  MCV 94.5 98.3 97.7  PLT 66* 99* 97*    Basic Metabolic Panel: Recent Labs  Lab 12/14/21 1912 12/15/21 1415 12/15/21 1611 12/15/21 1750  NA 135 128* 128* 131*  K 5.1 5.8* 5.1 3.9  CL 99 91* 95* 98  CO2 26 16* 15* 16*  GLUCOSE 421* 825* 819* 701*  BUN 15 43* 44* 47*  CREATININE 0.89 1.67* 1.62* 1.75*  CALCIUM 9.0 8.7* 7.9* 7.9*   GFR: Estimated Creatinine Clearance: 18.6 mL/min (A) (by C-G formula based on SCr of 1.75 mg/dL (H)). Recent Labs  Lab 12/14/21 1912 12/15/21 1353 12/15/21 1415  WBC 7.5 16.1* 15.5*    Liver Function Tests: Recent Labs  Lab 12/14/21 1912  AST 28  ALT 20  ALKPHOS 89  BILITOT 0.8  PROT 6.2*  ALBUMIN 3.7   No results for input(s): LIPASE, AMYLASE in the last 168 hours. No results for input(s): AMMONIA in the  last 168 hours.  ABG    Component Value Date/Time   HCO3 14.9 (L) 12/15/2021 1507   TCO2 15 07/17/2011 1226   ACIDBASEDEF 12.8 (H) 12/15/2021 1507   O2SAT 62.8 12/15/2021 1507     Coagulation Profile: No results for input(s): INR, PROTIME in the last 168 hours.  Cardiac Enzymes: No results for input(s): CKTOTAL, CKMB, CKMBINDEX, TROPONINI in the last 168 hours.  HbA1C: Hemoglobin A1C  Date/Time Value Ref Range Status  03/24/2019 12:00 AM 7.9  Final   Hgb A1c MFr Bld  Date/Time Value Ref Range Status  08/07/2019 08:57 AM 8.0 (H) 4.8 - 5.6 % Final    Comment:             Prediabetes: 5.7 - 6.4          Diabetes: >6.4          Glycemic control for adults with diabetes: <7.0   08/12/2018 11:18 AM 9.0 (H) 4.8 - 5.6 % Final    Comment:  Prediabetes: 5.7 - 6.4          Diabetes: >6.4          Glycemic control for adults with diabetes: <7.0     CBG: Recent Labs  Lab 12/15/21 1843 12/15/21 2014 12/15/21 2127 12/15/21 2219 12/15/21 2318  GLUCAP >600* 511* 478* 427* 367*    Review of Systems: Positives in BOLD  Gen: Denies fever, chills, weight change, fatigue, night sweats HEENT: Denies blurred vision, double vision, hearing loss, tinnitus, sinus congestion, rhinorrhea, sore throat, neck stiffness, dysphagia PULM: Denies shortness of breath, cough, sputum production, hemoptysis, wheezing CV: Denies chest pain, edema, orthopnea, paroxysmal nocturnal dyspnea, palpitations GI: Denies (on admission) abdominal pain, nausea, vomiting, diarrhea, hematochezia, melena, constipation, change in bowel habits GU: Denies dysuria, hematuria, polyuria, oliguria, urethral discharge Endocrine: Denies hot or cold intolerance, polyuria, polyphagia or appetite change Derm: Denies rash, dry skin, scaling or peeling skin change Heme: Denies easy bruising, bleeding, bleeding gums Neuro: Denies headache, numbness, weakness, slurred speech, loss of memory or consciousness  Past  Medical History:  She,  has a past medical history of Bradycardia, Coronary artery disease, Diabetes mellitus, Dyslipidemia, GERD (gastroesophageal reflux disease), Hypothyroid, and Presence of permanent cardiac pacemaker (12/16/2018).   Surgical History:   Past Surgical History:  Procedure Laterality Date   APPENDECTOMY     BREAST BIOPSY Right 1967   EXCISIONAL - NEG   CHOLECYSTECTOMY     CORONARY STENT INTERVENTION     INSERT / REPLACE / REMOVE PACEMAKER  12/16/2018   no surgical hx     PACEMAKER IMPLANT N/A 12/16/2018   Procedure: PACEMAKER IMPLANT;  Surgeon: Evans Lance, MD;  Location: [place] [facility];  Service: Cardiovascular;  Laterality: N/A;     Social History:   reports that she has been smoking cigarettes. She has a 50.00 pack-year smoking history. She has never used smokeless tobacco. She reports that she does not drink alcohol and does not use drugs.   Family History:  Her family history includes Breast cancer (age of onset: 71) in her sister; Breast cancer (age of onset: 13) in her sister; Cancer in her sister, sister, and sister; Diabetes in her son; Heart Problems in her mother; Other in her father.   Allergies Allergies  Allergen Reactions   Darvon     "FEEL SICK"   Propoxyphene Nausea Only and Nausea And Vomiting   Sulfa Antibiotics Nausea Only and Nausea And Vomiting    "FEEL SICK"      Home Medications  Prior to Admission medications   Medication Sig Start Date End Date Taking? Authorizing Provider  atorvastatin (LIPITOR) 20 MG tablet Take 1 tablet (20 mg total) by mouth daily at 6 PM. 12/09/20  Yes Fay Records, MD  cetirizine (ZYRTEC) 10 MG tablet Take 10 mg by mouth daily.   Yes [provider]  citalopram (CELEXA) 20 MG tablet Take 20 mg by mouth daily.   Yes [provider]  levothyroxine (SYNTHROID, LEVOTHROID) 50 MCG tablet Take 50 mcg by mouth daily before breakfast.  05/16/18  Yes [provider]  lisinopril  (PRINIVIL,ZESTRIL) 20 MG tablet Take 1 tablet (20 mg total) by mouth daily. 09/22/18 12/15/21 Yes Sainani, Belia Heman, MD  Multiple Vitamin (MULTIVITAMIN WITH MINERALS) TABS tablet Take 1 tablet by mouth daily.   Yes [provider]  NOVOLOG 100 UNIT/ML injection Inject 0-50 Units into the skin continuous. Up to 50 units/ day via pump 12/14/21  Yes Carrie Mew, MD  acetaminophen (TYLENOL) 325 MG tablet Take 1-2 tablets (325-650 mg total) by mouth every 6 (six) hours as needed for mild pain or moderate pain. 12/17/18   Barrett, Evelene Croon, PA-C  CONTOUR NEXT TEST test strip USE 1 STRIP TO CHECK BLOOD SUGAR EIGHT TO NINE TIMES DAILY. 03/27/19   [provider]  Insulin Human (INSULIN PUMP) SOLN Inject 1 each into the skin continuous. Used with Novolog insulin    [provider]  Lancets 28G MISC Use as instructed. 7-8 times daily. 06/02/19   [provider]     Critical care time: 50 minutes     Venetia Night, AGACNP-BC Acute Care Nurse Practitioner [facility]   [phone] / [phone] Please see Amion for pager details.    Domingo Pulse Rust[place], AGACNP-BC Acute Care Nurse Practitioner [facility]   [phone] / [phone] Please see Amion for pager details.

## 2021-12-16 NOTE — Progress Notes (Signed)
Inpatient Diabetes Program Recommendations ° °AACE/ADA: New Consensus Statement on Inpatient Glycemic Control (2015) ° °Target Ranges:  Prepandial:   less than 140 mg/dL °     Peak postprandial:   less than 180 mg/dL (1-2 hours) °     Critically ill patients:  140 - 180 mg/dL  ° ° Latest Reference Range & Units 12/15/21 14:15  °Sodium 135 - 145 mmol/L 128 (L)  °Potassium 3.5 - 5.1 mmol/L 5.8 (H)  °Chloride 98 - 111 mmol/L 91 (L)  °CO2 22 - 32 mmol/L 16 (L)  °Glucose 70 - 99 mg/dL 825 (HH)  °BUN 8 - 23 mg/dL 43 (H)  °Creatinine 0.44 - 1.00 mg/dL 1.67 (H)  °Calcium 8.9 - 10.3 mg/dL 8.7 (L)  °Anion gap 5 - 15  21 (H)  ° ° Latest Reference Range & Units 12/15/21 20:14 12/15/21 21:27 12/15/21 22:19 12/15/21 23:18 12/16/21 01:39 12/16/21 02:44 12/16/21 03:15 12/16/21 04:46 12/16/21 05:52  °Glucose-Capillary 70 - 99 mg/dL 511 (HH) 478 (H) 427 (H) 367 (H) 257 (H) 202 (H) 162 (H) 186 (H) 186 (H)  ° ° ° °Admit DKA--Pt's Insulin Pump had been off for day per pt report ° °Type 1 diabetes °  °Home: Insulin Pump (see below for settings from ENDO office) °            Freestyle Libre 2 CGM ° ° °MD- Note 3:28am BMET shows the following: °Glucose 173 °Anion gap 7 °CO2 level 22 ° °I met w/ pt down in the ED yesterday afternoon and she did NOT have her insulin pump nor her insulin pump supplies.  If she is not completely alert and oriented and does not have all new pump supplies (ie. Reservoir, tubing, insertion site etc), I do not recommend we have pt restart her insulin pump yet at time of transition.  Instead, would recommend we have pt transition to SQ Insulin until we can assess what supplies she has and if she can independently operate the pump. ° °For transition, recommend the following based on her latest insulin pump settings: ° °1. Start Semglee 14 units Daily (make sure to continue the IV Insulin Drip for 2 hours after 1st dose Semglee on board) ° °2. Start Novolog Sensitive Correction Scale/ SSI (0-9 units) TID AC +  HS °Choose Q4 hours if pt will remain NPO ° °3. If PO diet started today, please also order Novolog Meal Coverage: Novolog 3 units TID with meals °HOLD if pt eats <50% meals ° ° ° ° °Endocrinologist: Dr. Solum with [facility]--last seen 10/07/2021 °Pump settings at that visit were as follows:  °Basal settings °12 AM 0.45 units/hr °6 am 0.925 units/hr °8 am 0.6 units/hr °5 pm 0.7 units/hr 24-hr basal = 14.85 units NovoLog  ° °Bolus settings °I:C 12AM 15, 11AM 12 °Sensitivity 65 °Target 100-120 °Active insulin time 5 hrs ° °--Will follow patient during hospitalization-- ° ° [place]  RN, MSN, CDE °Diabetes Coordinator °Inpatient Glycemic Control Team °Team Pager: [phone] (8a-5p) ° °  ° °

## 2021-12-16 NOTE — ED Notes (Signed)
Called lab for BMP and BNP and Lactic pull

## 2021-12-16 NOTE — Progress Notes (Addendum)
Met w/ pt's daughter at bedside this AM Cinda Quest).  Dtr told me she thinks pt tried to go to pharmacy and get Novolog filled and the pharmacy was closed.  Unsure how long insulin pump was not running.  We discussed all the DKA treatments given and the plan to transition to SQ Insulin injections for now.  I explained how the insulin we will give SQ will try to closely match what pt gets on her insulin pump at home.  I have placed a call to pt's son Ronalee Belts as son lives close to pt and may know more about pt's ability to self-manage her pump.  Also asked son to please bring pump and all new pump supplies to hospital as MD may allow pt to restart her insulin pump at some point (this will need to take place 24 hours after last dose Semglee given so as not to overlap basal insulin injection and basal insulin on pump).  Dtr appreciative of all info.  Waiting for call back from son.    Addendum 3pm--RN sent me [place] that son has brought insulin pump and supplies to hospital.  RN to secure pump and supplies with pt belongings.  Son told RN that he does not know how to use the pump and pt is supposed ot be able to use the pump independently.  I will plan to re-assess pt's status and ability to use insulin pump tomorrow 02/01.    --Will follow patient during hospitalization--  Wyn Quaker RN, MSN, CDE Diabetes Coordinator Inpatient Glycemic Control Team Team Pager: [phone] (8a-5p)

## 2021-12-16 NOTE — Progress Notes (Signed)
Patient currently resting, NSR, room air.  Patient weaned off pressors this morning. Insulin IV stopped, Patient started on ACHS therapy. Patient's son brought in pump/supplies, bagged with label in patients room. Critical value reported to MD, Trop 636. Patient had 3 unmeasured voids. Will continue to monitor.

## 2021-12-16 NOTE — Plan of Care (Signed)
Discussed with patient plan of care for the evening, pain management and admission questions with some teach back displayed. Son and husband in room with patient.  Problem: Activity: Goal: Risk for activity intolerance will decrease Outcome: Progressing   Problem: Pain Managment: Goal: General experience of comfort will improve Outcome: Progressing   Problem: Education: Goal: Ability to describe self-care measures that may prevent or decrease complications (Diabetes Survival Skills Education) will improve Outcome: Progressing

## 2021-12-16 NOTE — Assessment & Plan Note (Signed)
Platelets near baseline. --Monitor CBC

## 2021-12-16 NOTE — Assessment & Plan Note (Signed)
Continue levothyroxine 

## 2021-12-16 NOTE — Assessment & Plan Note (Signed)
POA 2/2 dka. K 5.8.  Treated with insulin drip.  K this morning normalized at 4.3. Monitor BMP.

## 2021-12-16 NOTE — Progress Notes (Signed)
Patient weaned off pressors, alert and awake and eating. No further PCCM recs  Will sign off at this time

## 2021-12-16 NOTE — Assessment & Plan Note (Signed)
POA with Cr 1.67, baseline is normal. 2/2 dehydration from DKA. Cr improving with fluids -- Monitor BMP

## 2021-12-16 NOTE — Progress Notes (Signed)
PHARMACIST - PHYSICIAN COMMUNICATION  CONCERNING: IV to Oral Route Change Policy  RECOMMENDATION: This patient is receiving pantoprazole by the intravenous route.  Based on criteria approved by the Pharmacy and Therapeutics Committee, the intravenous medication(s) is/are being converted to the equivalent oral dose form(s).   DESCRIPTION: These criteria include: The patient is eating (either orally or via tube) and/or has been taking other orally administered medications for a least 24 hours The patient has no evidence of active gastrointestinal bleeding or impaired GI absorption (gastrectomy, short bowel, patient on TNA or NPO).  If you have questions about this conversion, please contact the Pharmacy Department   Tressie Ellis, [facility] 12/16/2021 11:42 AM

## 2021-12-16 NOTE — Assessment & Plan Note (Signed)
Stable.  No chest pain. Hx of NTEMI with stent. Mild troponin elevation likely demand ischemia in setting of hypotension and DKA --Continue statin

## 2021-12-16 NOTE — ED Notes (Signed)
Messaged provider re: MAP dropping again- per provider administer additional 500 ml 0.9% NaCl bolus

## 2021-12-16 NOTE — ED Notes (Signed)
CBG collection for endotool overdue by 80 minutes due to other critical patients in other rooms

## 2021-12-16 NOTE — Assessment & Plan Note (Signed)
POA. Due to DKA and hypotension.  Resolved. --Delirium precautions

## 2021-12-16 NOTE — TOC Initial Note (Addendum)
Transition of Care [facility]) - Initial/Assessment Note    Patient Details  Name: Madeline [name] MRN: [id number] Date of Birth: [birth year]  Transition of Care [facility]) CM/SW Contact:    Allayne Butcher, RN Phone Number: 12/16/2021, 10:54 AM  Clinical Narrative:                 Patient admitted to the hospital with DKA, patient is a type 1 diabetic on an insulin pump.  Per daughter, Darnelle Catalan patient ran out of her insulin in her pump and when she went to refill it there was a problem at the pharmacy and she couldn't get her medicine.  Daughter reports that he patient shouldn't have any issues continuing to manage her diabetes and insulin pump.   Patient is from home with her husband, she is independent at home and drives sometimes but husband mostly provides transportation.  They have a son that lives about 5 minutes from them and a daughter that is about 20 min away.   Patient and family agree with home health services being set up.  HH referral for RN, PT and OT given to [facility] with [facility].    1311Barbara Cower with [facility] accepted referral for [facility] services.    Expected Discharge Plan: Home w Home Health Services Barriers to Discharge: Continued Medical Work up   Patient Goals and CMS Choice Patient states their goals for this hospitalization and ongoing recovery are:: Patient wants to get well and get home CMS Medicare.gov Compare Post Acute Care list provided to:: Patient Choice offered to / list presented to : Patient  Expected Discharge Plan and Services Expected Discharge Plan: Home w Home Health Services   Discharge Planning Services: CM Consult Post Acute Care Choice: Home Health Living arrangements for the past 2 months: Single Family Home                 DME Arranged: N/A DME Agency: NA       HH Arranged: RN, PT, OT HH Agency: [facility] Date HH Agency Contacted: 12/16/21 Time HH Agency Contacted: 1052 Representative spoke with at [facility] Agency: Feliberto Gottron  Prior Living Arrangements/Services Living arrangements for the past 2 months: Single Family Home Lives with:: Spouse Patient language and need for interpreter reviewed:: Yes Do you feel safe going back to the place where you live?: Yes      Need for Family Participation in Patient Care: Yes (Comment) (Type 1 DM) Care giver support system in place?: Yes (comment) (husband, daughter, son)   Criminal Activity/Legal Involvement Pertinent to Current Situation/Hospitalization: No - Comment as needed  Activities of Daily Living Home Assistive Devices/Equipment: CBG Meter, Dentures (specify type), Eyeglasses ADL Screening (condition at time of admission) Patient's cognitive ability adequate to safely complete daily activities?: Yes Is the patient deaf or have difficulty hearing?: Yes Does the patient have difficulty seeing, even when wearing glasses/contacts?: Yes Does the patient have difficulty concentrating, remembering, or making decisions?: No Patient able to express need for assistance with ADLs?: Yes Does the patient have difficulty dressing or bathing?: Yes Independently performs ADLs?: Yes (appropriate for developmental age) Does the patient have difficulty walking or climbing stairs?: No Weakness of Legs: None Weakness of Arms/Hands: None  Permission Sought/Granted Permission sought to share information with : Case Manager, Family Supports Permission granted to share information with : Yes, Verbal Permission Granted  Share Information with NAME: Phillis Knack Siharath  Permission granted to share info w AGENCY: [facility] agencies  Permission  granted to share info w Relationship: spouse  Permission granted to share info w Contact Information: [phone]  Emotional Assessment Appearance:: Appears stated age Attitude/Demeanor/Rapport: Engaged Affect (typically observed): Accepting Orientation: : Oriented to Self, Oriented to  Time Alcohol / Substance Use: Not Applicable Psych  Involvement: No (comment)  Admission diagnosis:  DKA (diabetic ketoacidosis) (HCC) [E11.10] Diabetic ketoacidosis without coma associated with type 1 diabetes mellitus (HCC) [E10.10] Patient Active Problem List   Diagnosis Date Noted   DKA (diabetic ketoacidosis) (HCC) 12/15/2021   Hypothyroidism 12/15/2021   Complete heart block (HCC) 12/16/2018   AV block, Mobitz 2 12/16/2018   Bradycardia 09/21/2018   Diabetes (HCC) 09/21/2018   Chronic ITP (idiopathic thrombocytopenia) (HCC) 02/04/2018   Thrombocytopenia (HCC) 10/28/2016   COPD, moderate (HCC) 04/26/2014   CAD (coronary artery disease) 08/14/2011   Dyslipidemia 08/14/2011   Tobacco abuse 08/14/2011   PCP:  Marguarite Arbour, MD Pharmacy:   [facility] [address] [place] - [address] [place] [address] Phone: [phone] Fax: [phone]     Social Determinants of Health (SDOH) Interventions    Readmission Risk Interventions Readmission Risk Prevention Plan 12/16/2021  Transportation Screening Complete  PCP or Specialist Appt within 5-7 Days Complete  Home Care Screening Complete  Medication Review (RN CM) Complete  Some recent data might be hidden

## 2021-12-16 NOTE — Progress Notes (Signed)
° °      CROSS COVER NOTE  NAME: Madeline [name] MRN: [id number] DOB : 06-08-1934  Secure chat received from RN reporting BP 91/35 and MAP in the 50s.  Patient admitted tonight in DKA with acute encephalopathy.  PMH significant for type 1 diabetes on insulin pump, coronary artery disease status post STEMI in 2012, high degree heart block s/p PPM, and ITP.  1L fluid bolus ordered, as patient thought to be hypovolemic /dehydrated in the setting of DKA.. Follow up BP 94/79.  2330: Another message received from RN regarding hypotension. bolus and 5 mg of PO midodrine ordered.  0030: BP 79/36 ; PCCM consulted for pressor management.  Patient upgraded to ICU level of care.  [name] MHA, MSN, FNP-BC Nurse Practitioner [facility] Pager [phone]

## 2021-12-16 NOTE — Hospital Course (Signed)
88 y.o. female with medical history significant for type 1 diabetes on insulin pump, CAD s/p STEMI in 2012, high degree heart block w/ PPM, ITP, presented to the ED on evening of 12/14/2021 with confusion and hyperglycemia with glucose of 825 with DKA.  She had been seen in the ED the night before for hyperglycemia, wasn't in DKA at the time, so was discharged.  Reportedly family were unable to obtain pump supplies from pharmacy before they closed.    Admitted to medicine service on insulin drip. Subsequently developed hypotension requiring Levophed in ICU with PCCM consulted.   This morning, sugars controlled and transitioned from infusion to subcutaneous insulin until pump and supplies can be brought in to transition.  Hypotension improved and Levophed was weaned off this morning.  BP's stable.  Transfer to med/surg pending.

## 2021-12-16 NOTE — Assessment & Plan Note (Signed)
Continue Celexa

## 2021-12-17 LAB — CBC
HCT: 31.6 % — ABNORMAL LOW (ref 36.0–46.0)
Hemoglobin: 10.5 g/dL — ABNORMAL LOW (ref 12.0–15.0)
MCH: 31 pg (ref 26.0–34.0)
MCHC: 33.2 g/dL (ref 30.0–36.0)
MCV: 93.2 fL (ref 80.0–100.0)
Platelets: 48 10*3/uL — ABNORMAL LOW (ref 150–400)
RBC: 3.39 MIL/uL — ABNORMAL LOW (ref 3.87–5.11)
RDW: 13.2 % (ref 11.5–15.5)
WBC: 11.8 10*3/uL — ABNORMAL HIGH (ref 4.0–10.5)
nRBC: 0 % (ref 0.0–0.2)

## 2021-12-17 LAB — GLUCOSE, CAPILLARY
Glucose-Capillary: 400 mg/dL — ABNORMAL HIGH (ref 70–99)
Glucose-Capillary: 462 mg/dL — ABNORMAL HIGH (ref 70–99)
Glucose-Capillary: 308 mg/dL — ABNORMAL HIGH (ref 70–99)

## 2021-12-17 LAB — URINALYSIS, ROUTINE W REFLEX MICROSCOPIC
Bilirubin Urine: NEGATIVE
Glucose, UA: >1000 mg/dL — AB
Ketones, ur: 40 mg/dL — AB
Leukocytes,Ua: NEGATIVE
Nitrite: NEGATIVE
Protein, ur: NEGATIVE mg/dL
Specific Gravity, Urine: 1.01 (ref 1.005–1.030)
pH: 5.5 (ref 5.0–8.0)

## 2021-12-17 LAB — BASIC METABOLIC PANEL
Anion gap: 5 (ref 5–15)
BUN: 25 mg/dL — ABNORMAL HIGH (ref 8–23)
CO2: 23 mmol/L (ref 22–32)
Calcium: 8 mg/dL — ABNORMAL LOW (ref 8.9–10.3)
Chloride: 110 mmol/L (ref 98–111)
Creatinine, Ser: 0.82 mg/dL (ref 0.44–1.00)
GFR, Estimated: >60 mL/min (ref >60)
Glucose, Bld: 320 mg/dL — ABNORMAL HIGH (ref 70–99)
Potassium: 3.7 mmol/L (ref 3.5–5.1)
Sodium: 138 mmol/L (ref 135–145)

## 2021-12-17 LAB — HEMOGLOBIN A1C
Hgb A1c MFr Bld: 8.2 % — ABNORMAL HIGH (ref 4.8–5.6)
Mean Plasma Glucose: 189 mg/dL

## 2021-12-17 LAB — PROCALCITONIN: Procalcitonin: 3.19 ng/mL

## 2021-12-17 LAB — MAGNESIUM: Magnesium: 2 mg/dL (ref 1.7–2.4)

## 2021-12-17 MED ORDER — LORAZEPAM 2 MG/ML IJ SOLN
0.5000 mg | Freq: Once | INTRAMUSCULAR | Status: AC
  Administered 2021-12-17: 0.5 mg via INTRAVENOUS
  Filled 2021-12-17: qty 1

## 2021-12-17 MED ORDER — INSULIN PUMP
Freq: Three times a day (TID) | SUBCUTANEOUS | Status: DC
  Filled 2021-12-17: qty 1

## 2021-12-17 MED ORDER — INSULIN ASPART 100 UNIT/ML IJ SOLN
0.0000 [IU] | Freq: Every day | INTRAMUSCULAR | Status: DC
  Administered 2021-12-17: 4 [IU] via SUBCUTANEOUS
  Filled 2021-12-17: qty 1

## 2021-12-17 MED ORDER — INSULIN ASPART 100 UNIT/ML IJ SOLN
0.0000 [IU] | Freq: Three times a day (TID) | INTRAMUSCULAR | Status: DC
  Administered 2021-12-17: 10 [IU] via SUBCUTANEOUS
  Administered 2021-12-18 (×2): 2 [IU] via SUBCUTANEOUS
  Administered 2021-12-19: 7 [IU] via SUBCUTANEOUS
  Administered 2021-12-19: 3 [IU] via SUBCUTANEOUS
  Filled 2021-12-17 (×5): qty 1

## 2021-12-17 MED ORDER — INSULIN ASPART 100 UNIT/ML IJ SOLN
3.0000 [IU] | Freq: Three times a day (TID) | INTRAMUSCULAR | Status: DC
  Administered 2021-12-17 – 2021-12-19 (×6): 3 [IU] via SUBCUTANEOUS
  Filled 2021-12-17 (×5): qty 1

## 2021-12-17 MED ORDER — INSULIN GLARGINE-YFGN 100 UNIT/ML ~~LOC~~ SOLN
14.0000 [IU] | Freq: Every day | SUBCUTANEOUS | Status: DC
  Administered 2021-12-17 – 2021-12-19 (×3): 14 [IU] via SUBCUTANEOUS
  Filled 2021-12-17 (×6): qty 0.14

## 2021-12-17 MED ORDER — INSULIN ASPART 100 UNIT/ML IJ SOLN
7.0000 [IU] | Freq: Once | INTRAMUSCULAR | Status: AC
  Administered 2021-12-17: 7 [IU] via SUBCUTANEOUS

## 2021-12-17 NOTE — Progress Notes (Signed)
PROGRESS NOTE  Madeline [name]  DOB: [birth year]  PCP: Marguarite Arbour, MD LKJ:[id number]  DOA: 12/15/2021  LOS: 2 days  Hospital Day: 3  Chief Complaint  Patient presents with   Hyperglycemia    Brief narrative: ROREY HODGES is a 88 y.o. female with PMH significant for DM 1 on insulin pump, CAD s/p STEMI in 2012, high degree heart block w/ PPM, ITP. Patient presented to the ED on evening of 12/14/2021 with confusion and hyperglycemia. She was seen in the ED the night before for hyperglycemia, wasn't in DKA at the time, so was discharged.  However family were unable to obtain pump supplies from pharmacy before they closed.  Await patient continued to become hypoglycemic. In the ED, she had a glucose level of 825 and was in DKA. Admitted to hospitalist service on insulin drip Patient was in circulatory shock because of severe dehydration and hence required peripheral Levophed. After blood sugar level was controlled, patient was switched to subcutaneous insulin.   Subjective: Patient was seen and examined this afternoon.  Pleasant elderly Caucasian female.  Lying on bed.  Sleeping, opens eyes on verbal command.  Able to engage in conversation but hard of hearing.  Husband and daughter were at bedside.  Assessment/Plan: DKA -Presented with blood sugar level more than 800, bicarbonate less than 15 -Improved with DKA protocol with IV fluid, insulin drip, electrolyte monitoring -Currently on subcu his insulin  Circulatory shock due to hypovolemia -Patient briefly required Vasopressors in the ICU.  Weaned off  Type 2 diabetes mellitus -A1c 8.2 on 12/15/2021 -Home meds include insulin pump -Currently on subcutaneous insulin.  We had a plan to switch her back to insulin pump today but in the evaluation by diabetes coordinator, patient was not alert and cooperative enough to manage the pump.  We will continue supportive insulin for now.  To me, patient states that she likes insulin pump  and feels confident to manage it. Once her mental status improves further, we may be to able to switch her to insulin pump. Recent Labs  Lab 12/16/21 1145 12/16/21 1239 12/16/21 1651 12/16/21 2112 12/17/21 1104  GLUCAP 188* 178* 203* 180* 400*   Elevated troponin- (present on admission) -Likely demand ischemia with hypotension.  Patient has no chest pain or ischemic symptoms. -Obtain echocardiogram to rule out wall motion abnormality. Recent Labs    12/16/21 0328 12/16/21 0753 12/16/21 1952 12/16/21 2126  CKTOTAL 483*  --   --   --   TROPONINIHS  --  636* 634* 595*    AKI  -Creatinine normal at baseline, peaked at 1.75 with DKA.  Improved down to normal now Recent Labs    12/14/21 1912 12/15/21 1415 12/15/21 1611 12/15/21 1750 12/16/21 0008 12/16/21 0328 12/17/21 0450  BUN 15 43* 44* 47* 50* 51* 25*  CREATININE 0.89 1.67* 1.62* 1.75* 1.70* 1.50* 0.82   Acute metabolic encephalopathy -due to DKA.  Gradually improving.  Continue delirium precautions  CAD (coronary artery disease) -Hx of NTEMI with stent.  Continue statin. ?  Not on any antiplatelet anticoagulant. -Stable.  No chest pain.  Complete heart block  S/p PPM.    Depression -Continue Celexa   Hyponatremia -Likely hypovolemic with DKA.  Improved Recent Labs  Lab 12/14/21 1912 12/15/21 1415 12/15/21 1611 12/15/21 1750 12/16/21 0008 12/16/21 0328 12/17/21 0450  NA 135 128* 128* 131* 135 136 138   Hypothyroidism -Continue levothyroxine    Chronic ITP (idiopathic thrombocytopenia) Platelets near baseline. --Monitor CBC  Mobility: Encourage ambulation.  PT eval ordered Living condition: Lives at home with husband Goals of care:   Code Status: Full Code  Nutritional status: Body mass index is 23.23 kg/m.      Diet:  Diet Order             Diet Carb Modified Fluid consistency: Thin; Room service appropriate? Yes  Diet effective now                  DVT prophylaxis:  heparin  injection 5,000 Units Start: 12/15/21 2200   Antimicrobials: none Fluid: Currently on 75 mill per hour of normal saline Consultants: None Family Communication: Daughter and husband at bedside  Status is: Inpatient  Continue in-hospital care because: Pending PT eval, pending echocardiogram, pending transition to insulin pump Level of care: Med-Surg   Dispo: The patient is from: Home              Anticipated d/c is to: Pending PT eval              Patient currently is not medically stable to d/c.   Difficult to place patient No     Infusions:   sodium chloride Stopped (12/16/21 0300)   sodium chloride 75 mL/hr at 12/17/21 0435   norepinephrine (LEVOPHED) Adult infusion Stopped (12/16/21 0916)    Scheduled Meds:  Chlorhexidine Gluconate Cloth  6 each Topical Daily   citalopram  20 mg Oral Daily   heparin  5,000 Units Subcutaneous Q8H   insulin aspart  0-5 Units Subcutaneous QHS   insulin aspart  0-9 Units Subcutaneous TID WC   insulin aspart  3 Units Subcutaneous TID WC   insulin glargine-yfgn  14 Units Subcutaneous Daily   levothyroxine  50 mcg Oral QAC breakfast   loratadine  10 mg Oral Daily   pantoprazole  40 mg Oral Daily    PRN meds: dextrose   Antimicrobials: Anti-infectives (From admission, onward)    None       Objective: Vitals:   12/17/21 0822 12/17/21 1527  BP: (!) 178/78 (!) 136/56  Pulse: 83 80  Resp: 16 16  Temp: 98.8 F (37.1 C) 98.3 F (36.8 C)  SpO2: 96% 96%    Intake/Output Summary (Last 24 hours) at 12/17/2021 1620 Last data filed at 12/17/2021 1409 Gross per 24 hour  Intake 676.37 ml  Output 1300 ml  Net -623.63 ml   Filed Weights   12/15/21 1345 12/16/21 0400  Weight: 52.2 kg 57.6 kg   Weight change:  Body mass index is 23.23 kg/m.   Physical Exam: General exam: Pleasant, elderly Caucasian female.  Not in distress Skin: No rashes, lesions or ulcers. HEENT: Atraumatic, normocephalic, no obvious bleeding Lungs: Clear to  auscultation bilaterally CVS: Regular rate and rhythm, no murmur GI/Abd soft, nontender, nondistended, bowel sound present CNS: Alert, awake, oriented to place and person, states this is January 2023 Psychiatry: Mood appropriate Extremities: No pedal edema, no calf tenderness  Data Review: I have personally reviewed the laboratory data and studies available.  F/u labs ordered Unresulted Labs (From admission, onward)     Start     Ordered   12/18/21 0500  CBC with Differential/Platelet  Tomorrow morning,   R       Question:  Specimen collection method  Answer:  Lab=Lab collect   12/17/21 1612   12/17/21 1132  Urine Culture  ONCE - STAT,   STAT        12/17/21 1131   12/17/21  0500  Procalcitonin  Daily,   STAT      12/16/21 0105   12/17/21 0500  Basic metabolic panel  Daily,   R     Question:  Specimen collection method  Answer:  Lab=Lab collect   12/16/21 2536            Signed, Lorin Glass, MD [facility] 12/17/2021

## 2021-12-17 NOTE — Progress Notes (Signed)
Inpatient Diabetes Program Recommendations  AACE/ADA: New Consensus Statement on Inpatient Glycemic Control (2015)  Target Ranges:  Prepandial:   less than 140 mg/dL      Peak postprandial:   less than 180 mg/dL (1-2 hours)      Critically ill patients:  140 - 180 mg/dL    Latest Reference Range & Units 12/16/21 08:33 12/16/21 09:39 12/16/21 10:50 12/16/21 11:45 12/16/21 12:39 12/16/21 16:51 12/16/21 21:12  Glucose-Capillary 70 - 99 mg/dL 093 (H) 818 (H) 299 (H) 188 (H) 178 (H) 203 (H) 180 (H)  (H): Data is abnormally high    Admit DKA--Pt's Insulin Pump had been off for day per pt report   Type 1 diabetes   Home: Insulin Pump (see below for settings from ENDO office)             Freestyle Libre 2 CGM  Current Orders: Semglee 14 units Daily     Novolog Sensitive Correction Scale/ SSI (0-9 units) TID AC + HS      Novolog 3 units TID with meals     MD- If you allow pt to restart her home Insulin Pump this AM  (if pt Alert and Oriented and able to independently resume her pump--supplies and pump are at bedside)  Do not give Semglee this AM Have pt restart home Insulin Pump by 10am Once pump restarted, d/c all SQ Insulins Place Insulin Pump Order set     --Will follow patient during hospitalization--  Ambrose Finland RN, MSN, CDE Diabetes Coordinator Inpatient Glycemic Control Team Team Pager: [phone] (8a-5p)

## 2021-12-17 NOTE — Progress Notes (Signed)
Inpatient Diabetes Program Recommendations  AACE/ADA: New Consensus Statement on Inpatient Glycemic Control (2015)  Target Ranges:  Prepandial:   less than 140 mg/dL      Peak postprandial:   less than 180 mg/dL (1-2 hours)      Critically ill patients:  140 - 180 mg/dL    Latest Reference Range & Units 12/17/21 11:04  Glucose-Capillary 70 - 99 mg/dL 453 (H)  7 units Novolog   (H): Data is abnormally high   Went by to see pt this AM.  Son in room--Son brought all pump supplies but pt seemed confused stating a piece was missing from her pump.  Upon inspection, all pieces of the pump and insertion pieces were present.  Pt kept fumbling around looking for other items and did not appear to be completely aware of what she was doing.  Son and I assisted pt to prime the tubing of the pump and pt was able to insert her insertion site independently, however, she kept picking at the insertion site after she placed it and telling me a piece was missing--she then told her husband to look in the trash and husband began to root thru the trash and I had to re-direct the husband to stop sorting thru the trash.  Pt did not appear to be completely aware of how to operate her pump and son and I both felt that pt is NOT safe to use pump in the hospital.  CBG was 400 at 11am--note that pt did NOT get any Novolog this AM and NO CBG was taken this AM either.  Called Dr. Pola Corn to discuss.  Relayed my concern that pt does not seem to be safe to independently manage her pump.  Received order for Novolog 7 units X 1 dose to be given for CBG of 400 and then to start Semglee 14 units daily (Start ASAP today) + Novolog 0-9 units TID AC + HS per SSI + Novolog 3 units meal coverage + Stop Home Insulin Pump.  Orders entered and RN made aware of new orders thru [facility].  Home Insulin pump removed from pt's abdomen and given to son to place in pt's purse.  Son aware of plan and agreeable to current SQ Insulin plan.    --Will follow  patient during hospitalization--  Ambrose Finland RN, MSN, CDE Diabetes Coordinator Inpatient Glycemic Control Team Team Pager: [phone] (8a-5p)

## 2021-12-18 ENCOUNTER — Inpatient Hospital Stay (HOSPITAL_COMMUNITY)
Admit: 2021-12-18 | Discharge: 2021-12-18 | Disposition: A | Discharge status: Home / Self Care | Payer: HMO | Attending: Internal Medicine | Admitting: Internal Medicine

## 2021-12-18 DIAGNOSIS — I9589 Other hypotension: Secondary | ICD-10-CM

## 2021-12-18 DIAGNOSIS — R778 Other specified abnormalities of plasma proteins: Secondary | ICD-10-CM

## 2021-12-18 LAB — CBC WITH DIFFERENTIAL/PLATELET
Abs Immature Granulocytes: 0.03 10*3/uL (ref 0.00–0.07)
Abs Immature Granulocytes: 0.04 10*3/uL (ref 0.00–0.07)
Basophils Absolute: 0 10*3/uL (ref 0.0–0.1)
Basophils Absolute: 0 10*3/uL (ref 0.0–0.1)
Basophils Relative: 0 %
Basophils Relative: 0 %
Eosinophils Absolute: 0 10*3/uL (ref 0.0–0.5)
Eosinophils Absolute: 0 10*3/uL (ref 0.0–0.5)
Eosinophils Relative: 1 %
Eosinophils Relative: 1 %
HCT: 28.9 % — ABNORMAL LOW (ref 36.0–46.0)
HCT: 29.2 % — ABNORMAL LOW (ref 36.0–46.0)
Hemoglobin: 9.7 g/dL — ABNORMAL LOW (ref 12.0–15.0)
Hemoglobin: 9.8 g/dL — ABNORMAL LOW (ref 12.0–15.0)
Immature Granulocytes: 0 %
Immature Granulocytes: 1 %
Lymphocytes Relative: 19 %
Lymphocytes Relative: 14 %
Lymphs Abs: 1.6 10*3/uL (ref 0.7–4.0)
Lymphs Abs: 1.2 10*3/uL (ref 0.7–4.0)
MCH: 30.8 pg (ref 26.0–34.0)
MCH: 30.8 pg (ref 26.0–34.0)
MCHC: 33.6 g/dL (ref 30.0–36.0)
MCHC: 33.6 g/dL (ref 30.0–36.0)
MCV: 91.7 fL (ref 80.0–100.0)
MCV: 91.8 fL (ref 80.0–100.0)
Monocytes Absolute: 0.6 10*3/uL (ref 0.1–1.0)
Monocytes Absolute: 0.6 10*3/uL (ref 0.1–1.0)
Monocytes Relative: 7 %
Monocytes Relative: 7 %
Neutro Abs: 6.3 10*3/uL (ref 1.7–7.7)
Neutro Abs: 6.4 10*3/uL (ref 1.7–7.7)
Neutrophils Relative %: 73 %
Neutrophils Relative %: 77 %
Platelets: 14 10*3/uL — CL (ref 150–400)
Platelets: 11 10*3/uL — CL (ref 150–400)
RBC: 3.15 MIL/uL — ABNORMAL LOW (ref 3.87–5.11)
RBC: 3.18 MIL/uL — ABNORMAL LOW (ref 3.87–5.11)
RDW: 12.7 % (ref 11.5–15.5)
RDW: 12.7 % (ref 11.5–15.5)
Smear Review: NORMAL
WBC: 8.6 10*3/uL (ref 4.0–10.5)
WBC: 8.3 10*3/uL (ref 4.0–10.5)
nRBC: 0 % (ref 0.0–0.2)
nRBC: 0 % (ref 0.0–0.2)

## 2021-12-18 LAB — PROCALCITONIN: Procalcitonin: 1.22 ng/mL

## 2021-12-18 LAB — BASIC METABOLIC PANEL
Anion gap: 6 (ref 5–15)
Anion gap: 4 — ABNORMAL LOW (ref 5–15)
BUN: 13 mg/dL (ref 8–23)
BUN: 14 mg/dL (ref 8–23)
CO2: 27 mmol/L (ref 22–32)
CO2: 26 mmol/L (ref 22–32)
Calcium: 7.8 mg/dL — ABNORMAL LOW (ref 8.9–10.3)
Calcium: 7.6 mg/dL — ABNORMAL LOW (ref 8.9–10.3)
Chloride: 110 mmol/L (ref 98–111)
Chloride: 108 mmol/L (ref 98–111)
Creatinine, Ser: 0.64 mg/dL (ref 0.44–1.00)
Creatinine, Ser: 0.82 mg/dL (ref 0.44–1.00)
GFR, Estimated: >60 mL/min (ref >60)
GFR, Estimated: >60 mL/min (ref >60)
Glucose, Bld: 85 mg/dL (ref 70–99)
Glucose, Bld: 245 mg/dL — ABNORMAL HIGH (ref 70–99)
Potassium: 2.9 mmol/L — ABNORMAL LOW (ref 3.5–5.1)
Potassium: 3.8 mmol/L (ref 3.5–5.1)
Sodium: 143 mmol/L (ref 135–145)
Sodium: 138 mmol/L (ref 135–145)

## 2021-12-18 LAB — ECHOCARDIOGRAM COMPLETE
AR max vel: 1.63 cm2
AV Area VTI: 1.73 cm2
AV Area mean vel: 1.72 cm2
AV Mean grad: 7 mmHg
AV Peak grad: 13.1 mmHg
Ao pk vel: 1.81 m/s
Area-P 1/2: 6.54 cm2
Calc EF: 45.7 %
Height: 62 in
MV VTI: 1.57 cm2
S' Lateral: 3.64 cm
Single Plane A2C EF: 45.2 %
Single Plane A4C EF: 47.2 %
Weight: 2031.76 oz

## 2021-12-18 LAB — ABO/RH: ABO/RH(D): B POS

## 2021-12-18 LAB — GLUCOSE, CAPILLARY
Glucose-Capillary: 103 mg/dL — ABNORMAL HIGH (ref 70–99)
Glucose-Capillary: 112 mg/dL — ABNORMAL HIGH (ref 70–99)
Glucose-Capillary: 194 mg/dL — ABNORMAL HIGH (ref 70–99)
Glucose-Capillary: 163 mg/dL — ABNORMAL HIGH (ref 70–99)
Glucose-Capillary: 187 mg/dL — ABNORMAL HIGH (ref 70–99)

## 2021-12-18 LAB — TYPE AND SCREEN
ABO/RH(D): B POS
Antibody Screen: NEGATIVE

## 2021-12-18 LAB — PATHOLOGIST SMEAR REVIEW

## 2021-12-18 LAB — TROPONIN I (HIGH SENSITIVITY): Troponin I (High Sensitivity): 187 ng/L (ref <18)

## 2021-12-18 MED ORDER — DEXAMETHASONE 6 MG PO TABS
40.0000 mg | ORAL_TABLET | Freq: Every day | ORAL | Status: DC
  Administered 2021-12-18: 40 mg via ORAL
  Filled 2021-12-18 (×2): qty 1

## 2021-12-18 MED ORDER — SODIUM CHLORIDE 0.9% IV SOLUTION: Freq: Once | INTRAVENOUS | Status: DC

## 2021-12-18 MED ORDER — LABETALOL HCL 5 MG/ML IV SOLN
20.0000 mg | INTRAVENOUS | Status: DC | PRN
  Administered 2021-12-18: 20 mg via INTRAVENOUS
  Filled 2021-12-18: qty 4

## 2021-12-18 MED ORDER — POTASSIUM CHLORIDE 10 MEQ/100ML IV SOLN
10.0000 meq | INTRAVENOUS | Status: DC
  Administered 2021-12-18 (×2): 10 meq via INTRAVENOUS
  Filled 2021-12-18 (×2): qty 100

## 2021-12-18 MED ORDER — POTASSIUM CHLORIDE CRYS ER 20 MEQ PO TBCR
40.0000 meq | EXTENDED_RELEASE_TABLET | Freq: Once | ORAL | Status: AC
  Administered 2021-12-18: 40 meq via ORAL
  Filled 2021-12-18: qty 2

## 2021-12-18 MED ORDER — IPRATROPIUM-ALBUTEROL 0.5-2.5 (3) MG/3ML IN SOLN
3.0000 mL | RESPIRATORY_TRACT | Status: DC | PRN
  Administered 2021-12-18: 3 mL via RESPIRATORY_TRACT
  Filled 2021-12-18: qty 3

## 2021-12-18 MED ORDER — HYDRALAZINE HCL 20 MG/ML IJ SOLN
10.0000 mg | Freq: Four times a day (QID) | INTRAMUSCULAR | Status: DC | PRN
  Administered 2021-12-19: 10 mg via INTRAVENOUS
  Filled 2021-12-18: qty 1

## 2021-12-18 MED ORDER — IMMUNE GLOBULIN (HUMAN) 10 GM/100ML IV SOLN
1.0000 g/kg | Freq: Once | INTRAVENOUS | Status: AC
Start: 2021-12-18 — End: 2021-12-18
  Administered 2021-12-18: 60 g via INTRAVENOUS
  Filled 2021-12-18 (×2): qty 600

## 2021-12-18 NOTE — Progress Notes (Signed)
Discussed plan for IVIG with RN. She requested I wait until vital signs stabilize before initiating IVIG infusion. She will re-enter consult when ready to start the medication.

## 2021-12-18 NOTE — Progress Notes (Signed)
Patient's platelet infusion complete @1845 .  After administration,  patient was assessed by primary RN with vital signs outside normal parameters with BP 176/80 and O2 sat 89% on RA with R-sided expiratory wheezing on auscultation.  This Primary RN paged on-call hospitalist @1858  regarding change in clinical presentation with orders placed for PRN IV labetolol and PRN neb treatments.  MD also instructed this RN OK to administer IVIG as long as vital signs stabilized and patient's presentation was improved.  Primary RN administered both PRNs w/o improvement of BP after administration BP 165/74 and persistent wheezing noted after breathing treatment.  Patient also with low-grade fever @1953  of 100.4 elevated from previous temperature of 98.6 taken at 1845.  Oncoming shift RN notified of clinical changes and patient left sitting in elevated position in bed with no signs of acute distress at this time.

## 2021-12-18 NOTE — Progress Notes (Signed)
Cross Cover Critical low potassium supplemented with 40 meq oral and 40 IV/  Platelets of [address]. No active bleeding. Big drop from yesterday at 48000

## 2021-12-18 NOTE — Significant Event (Signed)
Discussed with patient's oncologist Dr. Ellin Saba. Patient received 1 unit of platelet transfusion this afternoon He recommended a course of dexamethasone 40 mg daily for 4 days starting today Also recommended 1 dose of IVIG 1 g/kg.  Ordered. Called and updated patient's nurse as well as her daughter Ms. [name] at bedside. Tentative plan is to discharge her home tomorrow if platelet count more than 20,000 and not bleeding to complete remaining course of dexamethasone at home

## 2021-12-18 NOTE — Progress Notes (Signed)
PROGRESS NOTE  Madeline [name]  DOB: 1934/09/22  PCP: Marguarite Arbour, MD OMV:[id number]  DOA: 12/15/2021  LOS: 3 days  Hospital Day: 4  Chief Complaint  Patient presents with   Hyperglycemia    Brief narrative: Madeline [name] is a 88 y.o. female with PMH significant for DM 1 on insulin pump, CAD s/p STEMI in 2012, high degree heart block w/ PPM, ITP. Patient presented to the ED on evening of 12/14/2021 with confusion and hyperglycemia. She was seen in the ED the night before for hyperglycemia, wasn't in DKA at the time, so was discharged.  However family were unable to obtain pump supplies from pharmacy before they closed.  Await patient continued to become hypoglycemic. In the ED, she had a glucose level of 825 and was in DKA. Admitted to hospitalist service on insulin drip. Patient was in circulatory shock because of severe dehydration and hence required peripheral Levophed. After blood sugar level was controlled, patient was switched to subcutaneous insulin.   Subjective: Patient was seen and examined this morning.  Pleasant elderly Caucasian female.  Lying down in bed.  Not in distress.   Pending PT eval Blood sugar level running less than 200 today, much better than yesterday Lab from this morning with platelet low at 11.  Assessment/Plan: DKA -Presented with blood sugar level more than 800, bicarbonate less than 15 -Improved with DKA protocol with IV fluid, insulin drip, electrolyte monitoring -Currently on subcutaneous insulin.  Circulatory shock due to hypovolemia -Patient briefly required Vasopressors in the ICU.  Weaned off  Type 2 diabetes mellitus -A1c 8.2 on 12/15/2021 -Home meds include insulin pump -Currently on subcutaneous insulin with Semglee 14 units daily, insulin aspart 3 units 3 times daily Premeal and sliding scale insulin.  Per family, patient is ran out of insulin for her pump, probably how she ended up in DKA.  We will continue subcutaneous  insulin for now. Recent Labs  Lab 12/17/21 1617 12/17/21 2003 12/18/21 0318 12/18/21 0809 12/18/21 1131  GLUCAP 462* 308* 103* 112* 194*    Severe acute on chronic thrombocytopenia Chronic ITP -Patient has chronic ITP followed up with oncology Dr Ellin Saba, but last follow-up seems to be 3 years ago in March 2020.  In the past she had required platelet transfusion as well. Her platelet count has significantly down trended in last 3 days, as low as 11 today.  She does not have active bleeding fortunately. Plan to give her 1 unit of platelet transfusion today.  Repeat CBC tomorrow. -I will discuss with her oncologist Dr. Ellin Saba. Recent Labs  Lab 12/14/21 1912 12/15/21 1415 12/17/21 0450 12/18/21 0452 12/18/21 0951  PLT 66* 97* 48* 14* 11*    Elevated troponin- (present on admission) -Likely demand ischemia with hypotension.  Patient has no chest pain or ischemic symptoms. -Repeat troponin this morning shows significant improvement.  Pending echocardiogram for any wall motion abnormality. Recent Labs    12/16/21 0328 12/16/21 0753 12/16/21 1952 12/16/21 2126 12/18/21 0452  CKTOTAL 483*  --   --   --   --   TROPONINIHS  --    < > 634* 595* 187*   < > = values in this interval not displayed.     AKI  -Creatinine normal at baseline, peaked at 1.75 with DKA.  Improved down to normal now Recent Labs    12/14/21 1912 12/15/21 1415 12/15/21 1611 12/15/21 1750 12/16/21 0008 12/16/21 0328 12/17/21 0450 12/18/21 0452 12/18/21 0951  BUN 15 43*  44* 47* 50* 51* 25* 13 14  CREATININE 0.89 1.67* 1.62* 1.75* 1.70* 1.50* 0.82 0.64 0.82    Acute metabolic encephalopathy -due to DKA.  Gradually improving.  Continue delirium precautions  CAD (coronary artery disease) -Hx of NTEMI with stent.  Continue statin. ?  Not on any antiplatelet anticoagulant, presumably because of chronic thrombocytopenia -Stable.  No chest pain.  Complete heart block  S/p PPM.     Depression -Continue Celexa   Hypothyroidism -Continue levothyroxine    Chronic ITP (idiopathic thrombocytopenia) -Platelets near baseline. -Monitor CBC   Mobility: Encourage ambulation.  PT eval ordered Living condition: Lives at home with husband Goals of care:   Code Status: Full Code  Nutritional status: Body mass index is 23.23 kg/m.      Diet:  Diet Order             Diet Carb Modified Fluid consistency: Thin; Room service appropriate? Yes  Diet effective now                  DVT prophylaxis:     Antimicrobials: none Fluid: Currently on 75 mill per hour of normal saline.  Can stop IV fluid today Consultants: None Family Communication: Daughter and husband at bedside  Status is: Inpatient  Continue in-hospital care because: Pending PT eval, pending echocardiogram, platelet level acutely low. Level of care: Med-Surg   Dispo: The patient is from: Home              Anticipated d/c is to: Pending PT eval              Patient currently is not medically stable to d/c.   Difficult to place patient No     Infusions:   sodium chloride Stopped (12/16/21 0300)   sodium chloride 75 mL/hr at 12/18/21 0040   norepinephrine (LEVOPHED) Adult infusion Stopped (12/16/21 0916)    Scheduled Meds:  sodium chloride   Intravenous Once   Chlorhexidine Gluconate Cloth  6 each Topical Daily   citalopram  20 mg Oral Daily   insulin aspart  0-5 Units Subcutaneous QHS   insulin aspart  0-9 Units Subcutaneous TID WC   insulin aspart  3 Units Subcutaneous TID WC   insulin glargine-yfgn  14 Units Subcutaneous Daily   levothyroxine  50 mcg Oral QAC breakfast   loratadine  10 mg Oral Daily   pantoprazole  40 mg Oral Daily    PRN meds: dextrose   Antimicrobials: Anti-infectives (From admission, onward)    None       Objective: Vitals:   12/18/21 0754 12/18/21 1459  BP: (!) 157/68 (!) 157/63  Pulse: 72 76  Resp: 20 18  Temp: 98.7 F (37.1 C) 99.2 F (37.3  C)  SpO2: 92% 97%    Intake/Output Summary (Last 24 hours) at 12/18/2021 1515 Last data filed at 12/18/2021 0800 Gross per 24 hour  Intake 120 ml  Output 750 ml  Net -630 ml    Filed Weights   12/15/21 1345 12/16/21 0400  Weight: 52.2 kg 57.6 kg   Weight change:  Body mass index is 23.23 kg/m.   Physical Exam: General exam: Pleasant, elderly Caucasian female.  Not in distress Skin: No rashes, lesions or ulcers. HEENT: Atraumatic, normocephalic, no obvious bleeding Lungs: Clear to auscultation bilaterally CVS: Regular rate and rhythm, no murmur GI/Abd soft, nontender, nondistended, bowel sound present CNS: Alert, awake, oriented to place and person, states this is January 2023 Psychiatry: Mood appropriate Extremities: No pedal  edema, no calf tenderness  Data Review: I have personally reviewed the laboratory data and studies available.  F/u labs ordered Unresulted Labs (From admission, onward)     Start     Ordered   12/19/21 0500  CBC with Differential/Platelet  Daily,   R     Question:  Specimen collection method  Answer:  Lab=Lab collect   12/18/21 0839   12/17/21 0500  Basic metabolic panel  Daily,   R     Question:  Specimen collection method  Answer:  Lab=Lab collect   12/16/21 0160            Signed, Lorin Glass, MD [facility] 12/18/2021

## 2021-12-18 NOTE — Progress Notes (Signed)
Patient is sating at 93% on room air at rest.  Will continue to monitor.  Christene Slates

## 2021-12-18 NOTE — Progress Notes (Signed)
PT Cancellation Note  Patient Details Name: Madeline [name] MRN: [id number] DOB: 1933/12/09   Cancelled Treatment:    Reason Eval/Treat Not Completed: Medical issues which prohibited therapy. PT will hold on mobility at this time per discussion with MD as patient with low platelet level and awaiting repeat labs. PT to follow up as appropriate.   Donna Bernard, PT, MPT   Ina Homes 12/18/2021, 9:51 AM

## 2021-12-18 NOTE — Progress Notes (Signed)
Inpatient Diabetes Program Recommendations  AACE/ADA: New Consensus Statement on Inpatient Glycemic Control (2015)  Target Ranges:  Prepandial:   less than 140 mg/dL      Peak postprandial:   less than 180 mg/dL (1-2 hours)      Critically ill patients:  140 - 180 mg/dL   Lab Results  Component Value Date   GLUCAP 194 (H) 12/18/2021   HGBA1C 8.2 (H) 12/15/2021    Review of Glycemic Control  Latest Reference Range & Units 12/17/21 16:17 12/17/21 20:03 12/18/21 03:18 12/18/21 08:09 12/18/21 11:31  Glucose-Capillary 70 - 99 mg/dL 809 (H) 983 (H) 382 (H) 112 (H) 194 (H)  Admit DKA--Pt's Insulin Pump had been off for day per pt report   Type 1 diabetes   Home: Insulin Pump (see below for settings from ENDO office)             Freestyle Libre 2 CGM   Current Orders: Semglee 14 units Daily                           Novolog Sensitive Correction Scale/ SSI (0-9 units) TID AC + HS                            Novolog 3 units TID with meals  Inpatient Diabetes Program Recommendations:    Blood sugars are much better.  Visited with patient and she is okay with not being on the insulin pump while in the hospital. Per family member, patient had run out of insulin to be put in the insulin pump. May need to d/c on basal/bolus insulin at discharge until she can follow-up with Dr. Tedd Sias.   Thanks,  Beryl Meager, RN, BC-ADM Inpatient Diabetes Coordinator Pager [phone]  (8a-5p)

## 2021-12-18 NOTE — Progress Notes (Signed)
*  PRELIMINARY RESULTS* Echocardiogram 2D Echocardiogram has been performed.  Madeline [name] 12/18/2021, 10:37 AM

## 2021-12-18 NOTE — Care Management Important Message (Signed)
Important Message  Patient Details  Name: Madeline [name] MRN: [id number] Date of Birth: 18-Jun-1934   Medicare Important Message Given:  Yes     Johnell Comings 12/18/2021, 2:02 PM

## 2021-12-19 ENCOUNTER — Other Ambulatory Visit (HOSPITAL_COMMUNITY): Payer: Self-pay

## 2021-12-19 DIAGNOSIS — D649 Anemia, unspecified: Secondary | ICD-10-CM

## 2021-12-19 LAB — BPAM PLATELET PHERESIS
Blood Product Expiration Date: 202302042359
ISSUE DATE / TIME: 202302021451
Unit Type and Rh: 6200

## 2021-12-19 LAB — CBC WITH DIFFERENTIAL/PLATELET
Abs Immature Granulocytes: 0.04 10*3/uL (ref 0.00–0.07)
Basophils Absolute: 0 10*3/uL (ref 0.0–0.1)
Basophils Relative: 0 %
Eosinophils Absolute: 0 10*3/uL (ref 0.0–0.5)
Eosinophils Relative: 0 %
HCT: 27.9 % — ABNORMAL LOW (ref 36.0–46.0)
Hemoglobin: 9.2 g/dL — ABNORMAL LOW (ref 12.0–15.0)
Immature Granulocytes: 1 %
Lymphocytes Relative: 7 %
Lymphs Abs: 0.4 10*3/uL — ABNORMAL LOW (ref 0.7–4.0)
MCH: 30.4 pg (ref 26.0–34.0)
MCHC: 33 g/dL (ref 30.0–36.0)
MCV: 92.1 fL (ref 80.0–100.0)
Monocytes Absolute: 0.1 10*3/uL (ref 0.1–1.0)
Monocytes Relative: 1 %
Neutro Abs: 5.2 10*3/uL (ref 1.7–7.7)
Neutrophils Relative %: 91 %
Platelets: 24 10*3/uL — CL (ref 150–400)
RBC: 3.03 MIL/uL — ABNORMAL LOW (ref 3.87–5.11)
RDW: 12.4 % (ref 11.5–15.5)
WBC: 5.7 10*3/uL (ref 4.0–10.5)
nRBC: 0 % (ref 0.0–0.2)

## 2021-12-19 LAB — GLUCOSE, CAPILLARY
Glucose-Capillary: 380 mg/dL — ABNORMAL HIGH (ref 70–99)
Glucose-Capillary: 302 mg/dL — ABNORMAL HIGH (ref 70–99)
Glucose-Capillary: 225 mg/dL — ABNORMAL HIGH (ref 70–99)

## 2021-12-19 LAB — BASIC METABOLIC PANEL
Anion gap: 8 (ref 5–15)
BUN: 15 mg/dL (ref 8–23)
CO2: 22 mmol/L (ref 22–32)
Calcium: 7.6 mg/dL — ABNORMAL LOW (ref 8.9–10.3)
Chloride: 103 mmol/L (ref 98–111)
Creatinine, Ser: 0.7 mg/dL (ref 0.44–1.00)
GFR, Estimated: >60 mL/min (ref >60)
Glucose, Bld: 357 mg/dL — ABNORMAL HIGH (ref 70–99)
Potassium: 3.8 mmol/L (ref 3.5–5.1)
Sodium: 133 mmol/L — ABNORMAL LOW (ref 135–145)

## 2021-12-19 LAB — PREPARE PLATELET PHERESIS: Unit division: 0

## 2021-12-19 LAB — URINE CULTURE: Culture: 30000 — AB

## 2021-12-19 MED ORDER — DEXAMETHASONE 6 MG PO TABS
40.0000 mg | ORAL_TABLET | Freq: Every day | ORAL | Status: AC
  Administered 2021-12-19: 40 mg via ORAL
  Filled 2021-12-19: qty 1

## 2021-12-19 MED ORDER — INSULIN ASPART 100 UNIT/ML IJ SOLN
15.0000 [IU] | Freq: Once | INTRAMUSCULAR | Status: AC
  Administered 2021-12-19: 15 [IU] via SUBCUTANEOUS
  Filled 2021-12-19: qty 1

## 2021-12-19 MED ORDER — IMMUNE GLOBULIN (HUMAN) 10 GM/100ML IV SOLN
1.0000 g/kg | Freq: Once | INTRAVENOUS | Status: AC
  Administered 2021-12-19: 60 g via INTRAVENOUS
  Filled 2021-12-19: qty 600

## 2021-12-19 NOTE — Evaluation (Addendum)
Physical Therapy Evaluation Patient Details Name: Madeline [name] MRN: [id number] DOB: 1934-08-11 Today's Date: 12/19/2021  History of Present Illness  Patient is an 88 year old women who reports to [facility] for diabetic ketoacidosis. PMH(+) for diabetes, heart block, and CAD.  Clinical Impression  Patient tolerated session well and was agreeable to treatment. Spoke with MD prior to session. MD cleared for therapy despite low platelet count (24) and high glucose (357). Upon entering room patient was supine with HOB elevated and husband and daughter present. Patient reported no pain throughout session. HR ranged from 78-90bpm throughout session. O2 remained >90% on 2L O2 via [place] throughout session. Prior to hospitalization, patient reported she was independent with all ADLs and mobility. Her husband they have a 2 wheeled RW at home if needed. Patient lives in a 1 story home with 2-3 STE and R handrail. Husband able to give 24/7 assistance and daughter able to give PRN assistance. Per daughter they are looking into getting a home aide for increased assistance at home.   Patient was able to demonstrate Mod I with all bed mobility. BUE and BLE strength at least 4+/5. When attempting sit to stand without an AD, increase unsteadiness noted that required L HHA for stability. With RW patient was able to complete Mod I with verbal cueing on hand placement. With RW and O2 via [place] patient was able to complete 1 lap around the nurses station Mod I with no reports of fatigue or unsteadiness noted. Gait speed WFL. When completing 2 steps in stair, increased unsteadiness noted when descending steps vs ascending steps. Patient required R hand rail assist and L HHA for stability (step to pattern to descend, step through pattern to ascend). Patient and family was educated on utilizing both hand rail and HHA to enter the home, and to utilize RW at home for stability. Patient and family both verbalized understanding. Per patient and  patient's family, she is demonstrating at near/at baseline level of function. Patient does not require any additional skilled physical therapy. Signing off.      Recommendations for follow up therapy are one component of a multi-disciplinary discharge planning process, led by the attending physician.  Recommendations may be updated based on patient status, additional functional criteria and insurance authorization.  Follow Up Recommendations No PT follow up    Assistance Recommended at Discharge Frequent or constant Supervision/Assistance  Patient can return home with the following  Help with stairs or ramp for entrance    Equipment Recommendations None recommended by PT (at this time)  Recommendations for Other Services       Functional Status Assessment Patient has had a recent decline in their functional status and demonstrates the ability to make significant improvements in function in a reasonable and predictable amount of time.     Precautions / Restrictions Precautions Precautions: Fall Restrictions Weight Bearing Restrictions: No      Mobility  Bed Mobility Overal bed mobility: Modified Independent                  Transfers Overall transfer level: Modified independent Equipment used: Rolling walker (2 wheels)               General transfer comment: Required L HHA to complete wit to stand without any AD, completed Mod I with RW    Ambulation/Gait Ambulation/Gait assistance: Modified independent (Device/Increase time) Gait Distance (Feet): 240 Feet Assistive device: Rolling walker (2 wheels) Gait Pattern/deviations: WFL(Within Functional Limits)  General Gait Details: good, safe speed, per daughter Elita Quick([name]) patient is at baseline speed with RW  Stairs Stairs: Yes Stairs assistance: Min guard (HHA) Stair Management: One rail Right (R hand rail) Number of Stairs: 2 General stair comments: increased unsteadiness with steps, descending steps  completed step to pattern, ascending steps completed step through pattern with increased time and mild unsteadiness, patient and daughter were educated on utilizing hand rail and hand held assist to enter and exit home and to hold off on going down into the basement for now  Wheelchair Mobility    Modified Rankin (Stroke Patients Only)       Balance Overall balance assessment: Needs assistance Sitting-balance support: No upper extremity supported, Feet supported Sitting balance-Leahy Scale: Good Sitting balance - Comments: patient able to sit EOB and maintain good balance while independently putting on socks   Standing balance support: Bilateral upper extremity supported, During functional activity, Reliant on assistive device for balance Standing balance-Leahy Scale: Fair Standing balance comment: reliant on AD for balance                             Pertinent Vitals/Pain Pain Assessment Pain Assessment: No/denies pain    Home Living Family/patient expects to be discharged to:: Private residence Living Arrangements: Spouse/significant other Available Help at Discharge: Available 24 hours/day;Available PRN/intermittently Type of Home: House Home Access: Stairs to enter Entrance Stairs-Rails: Left Entrance Stairs-Number of Steps: 2-3   Home Layout: One level;Laundry or work area in basement (12 steps to get into the basement (R hand rail)) Home Equipment: Scientist, research (life sciences)one;Rolling Walker (2 wheels)      Prior Function Prior Level of Function : Independent/Modified Independent             Mobility Comments: no AD ADLs Comments: independent     Hand Dominance   Dominant Hand: Right    Extremity/Trunk Assessment   Upper Extremity Assessment Upper Extremity Assessment: Overall WFL for tasks assessed    Lower Extremity Assessment Lower Extremity Assessment: Generalized weakness (overall strengthe in 4+/5 bilaterally, however increased unsteadiness with ascending  and descending steps with HR support and UE support; ROM bilaterally WNL)       Communication   Communication: No difficulties;HOH  Cognition Arousal/Alertness: Awake/alert Behavior During Therapy: WFL for tasks assessed/performed Overall Cognitive Status: Within Functional Limits for tasks assessed                                 General Comments: A&Ox3 situation, location, and year        General Comments      Exercises     Assessment/Plan    PT Assessment Patient does not need any further PT services  PT Problem List Decreased activity tolerance;Decreased balance       PT Treatment Interventions      PT Goals (Current goals can be found in the Care Plan section)  Acute Rehab PT Goals Patient Stated Goal: to go home PT Goal Formulation: With patient Time For Goal Achievement: 12/19/21 Potential to Achieve Goals: Good    Frequency       Co-evaluation               AM-PAC PT "6 Clicks" Mobility  Outcome Measure Help needed turning from your back to your side while in a flat bed without using bedrails?: None Help needed moving from lying on your back  to sitting on the side of a flat bed without using bedrails?: None Help needed moving to and from a bed to a chair (including a wheelchair)?: None Help needed standing up from a chair using your arms (e.g., wheelchair or bedside chair)?: None Help needed to walk in hospital room?: None Help needed climbing 3-5 steps with a railing? : A Little 6 Click Score: 23    End of Session Equipment Utilized During Treatment: Gait belt;Oxygen Activity Tolerance: Patient tolerated treatment well Patient left: in bed;with call bell/phone within reach;with bed alarm set;with family/visitor present Nurse Communication: Mobility status PT Visit Diagnosis: Unsteadiness on feet (R26.81)    Time: 7106-2694 PT Time Calculation (min) (ACUTE ONLY): 32 min   Charges:   PT Evaluation $PT Eval Low Complexity: 1  Low PT Treatments $Therapeutic Activity: 8-22 mins        Angelica Ran, PT  12/19/21. 9:52 AM

## 2021-12-19 NOTE — Progress Notes (Signed)
All three bottles of IVIG completed. Vitals stable.

## 2021-12-19 NOTE — Progress Notes (Signed)
Spoke with patient's daughter.  They plan to put insulin pump back on tonight.  Reminded her that patient did get basal insulin this AM however she also got steroids. She states that the plan to replace CGM when they get home and check blood sugars closely.  She did have question regarding Novolog that was opened yesterday and whether it could still be used.  Explained that Novolog could be at room temperature for 28 days and should be fine.    Thanks  Beryl Meager, RN, BC-ADM Inpatient Diabetes Coordinator Pager [phone]  (8a-5p)

## 2021-12-19 NOTE — Plan of Care (Signed)
°  Problem: Activity: Goal: Risk for activity intolerance will decrease Outcome: Progressing   Problem: Pain Managment: Goal: General experience of comfort will improve Outcome: Progressing   Problem: Safety: Goal: Ability to remain free from injury will improve Outcome: Progressing   Problem: Skin Integrity: Goal: Risk for impaired skin integrity will decrease Outcome: Progressing   Problem: Education: Goal: Ability to describe self-care measures that may prevent or decrease complications (Diabetes Survival Skills Education) will improve Outcome: Progressing Goal: Individualized Educational Video(s) Outcome: Progressing

## 2021-12-19 NOTE — Progress Notes (Signed)
Patient has been cleared to discharge home with orders placed by MD and transportation provided by family to home.  Patient received AVS prior to discharge including follow-up appointments and changes to medications with patient and family verbalizing understanding of reviewed material by primary RN.  PIVs removed prior to discharge with patient awaiting transportation by volunteer via wheelchair off the unit at this time.

## 2021-12-19 NOTE — TOC Transition Note (Addendum)
Transition of Care [facility]) - CM/SW Discharge Note   Patient Details  Name: Madeline [name] MRN: [id number] Date of Birth: Nov 09, 1934  [facility] CM/SW Contact:  Margarito Liner, LCSW Phone Number: 12/19/2021, 2:18 PM   Clinical Narrative:   Patient has orders to discharge home today. PT said no follow up needed but [facility] can still see her for RN and aide services. Start of care Monday 2/6. They will call patient to notify. Per RN in morning progression rounds, patient weaned to room air. No further concerns. CSW signing off.  Final next level of care: Home w Home Health Services Barriers to Discharge: Barriers Resolved   Patient Goals and CMS Choice Patient states their goals for this hospitalization and ongoing recovery are:: Patient wants to get well and get home CMS Medicare.gov Compare Post Acute Care list provided to:: Patient Choice offered to / list presented to : Patient  Discharge Placement                    Patient and family notified of of transfer: 12/19/21  Discharge Plan and Services   Discharge Planning Services: CM Consult Post Acute Care Choice: Home Health          DME Arranged: N/A DME Agency: NA       HH Arranged: RN, Nurse's Aide HH Agency: [facility] Date HH Agency Contacted: 12/19/21 Time HH Agency Contacted: 1052 Representative spoke with at [facility] Agency: Feliberto Gottron  Social Determinants of Health (SDOH) Interventions     Readmission Risk Interventions Readmission Risk Prevention Plan 12/16/2021  Transportation Screening Complete  PCP or Specialist Appt within 5-7 Days Complete  Home Care Screening Complete  Medication Review (RN CM) Complete  Some recent data might be hidden

## 2021-12-19 NOTE — Discharge Summary (Signed)
Physician Discharge Summary  Madeline [name] [id number] DOB: 1934-10-27 DOA: 12/15/2021  PCP: Idelle Crouch, MD  Admit date: 12/15/2021 Discharge date: 12/19/2021  Admitted From: Home Discharge disposition: Home  Recommendations at discharge:  Currently you are getting subcutaneous insulin in the hospital.  Please switch back to insulin pump at your home. Your platelet count is running low because of an acute flareup of ITP.  Please watch out for any signs of bleeding or easy bruisability.  You have a follow-up appointment with Dr. Delton Coombes on Monday 2/6. Keep your appointment with endocrinologist next week as you have previously reported  Discharge Diagnosis:   Principal Problem:   DKA (diabetic ketoacidosis) ([place]) Active Problems:   CAD (coronary artery disease)   Chronic ITP (idiopathic thrombocytopenia) ([facility])   Complete heart block ([facility])   Hypothyroidism   Hypotension   Acute metabolic encephalopathy   AKI (acute kidney injury) ([place])   Elevated troponin   Hyperkalemia   Depression   Brief narrative: Madeline [name] is a 88 y.o. female with PMH significant for DM 1 on insulin pump, CAD s/p STEMI in 2012, high degree heart block w/ PPM, ITP. Patient presented to the ED on evening of 12/14/2021 with confusion and hyperglycemia. She was seen in the ED the night before for hyperglycemia, wasn't in DKA at the time, so was discharged.  However family were unable to obtain pump supplies from pharmacy before they closed.  Await patient continued to become hypoglycemic. In the ED, she had a glucose level of 825 and was in DKA. Admitted to hospitalist service on insulin drip. Patient was in circulatory shock because of severe dehydration and hence required peripheral Levophed. After blood sugar level was controlled, patient was switched to subcutaneous insulin.   Subjective: Patient was seen and examined this morning.  Elderly Caucasian female.  Lying on bed.  Not in  distress.  Husband and daughter at bedside.  Not on supplemental oxygen.  She was able to ambulate in the hallway with PT. Labs from this morning with platelet improved to 24,000 today Blood sugar level was elevated to 300 this morning, on steroids.  Hospital course: DKA -Presented with blood sugar level more than 800, bicarbonate less than 15 -Improved with DKA protocol with IV fluid, insulin drip, electrolyte monitoring -Currently on subcutaneous insulin. -Per family, patient did not realize that she had already ran out of insulin for her pump leading to insulin deficiency and DKA  Circulatory shock due to hypovolemia -Patient briefly required Vasopressors in the ICU.  Weaned off  Type 2 diabetes mellitus -A1c 8.2 on 12/15/2021 -Patient follows up with endocrinologist Dr. Gabriel Carina with cardiology clinic.  Patient was on insulin pump prior to presentation. Per family, patient did not realize that she had already ran out of insulin for her pump leading to insulin deficiency and DKA. -Currently on subcutaneous insulin with long-acting insulin short-acting bolus insulin.   -Patient wants to stick with insulin pump rather than switching to subcutaneous insulin at home. I had a long conversation with the patient, her husband and daughter at bedside today.  Her daughter is going to help the patient out with setting of insulin pump again.   She states that she has a follow-up with her endocrinologist next week. Recent Labs  Lab 12/18/21 1650 12/18/21 2057 12/19/21 0358 12/19/21 0830 12/19/21 1149  GLUCAP 163* 187* 380* 302* 225*   Severe acute on chronic thrombocytopenia Chronic ITP -Patient has chronic ITP followed up with oncology Dr  Delton Coombes, but last follow-up seems to be 3 years ago in March 2020.   -Her platelet count significantly down trended to the lowest of 11 on 12/18/2021.  1 unit of PRBC transfusion was given.   -I discussed the case with her hematologist Dr. Delton Coombes.  He  recommended dexamethasone 40 mg daily for 2 days  (ideally for disease regimen but because of patient's uncontrolled hyperglycemia, it was given only for 2 days. ).  Patient also given a dose of IVIG.   -Platelet level improved to 24 this morning.  No episode of bleeding or easy bruisability at this time. -Patient has an appointment with Dr. Delton Coombes on Monday, 12/22/2021. Recent Labs  Lab 12/14/21 1912 12/15/21 1415 12/17/21 0450 12/18/21 0452 12/18/21 0951 12/19/21 0622  PLT 66* 97* 48* 14* 11* 24*   Elevated troponin- (present on admission) -Likely demand ischemia with hypotension.  Patient has no chest pain or ischemic symptoms. -Repeat troponin this morning showed significant improvement.  Echocardiogram with EF 60 to 65%, mild LVH, grade 1 diastolic dysfunction. Recent Labs    12/16/21 1952 12/16/21 2126 12/18/21 0452  TROPONINIHS 634* 595* 187*    Acute metabolic encephalopathy -due to DKA.  Gradually improving.  Continue delirium precautions  CAD (coronary artery disease) -Hx of NTEMI with stent.  Continue statin.  She is not on any antiplatelet anticoagulant, presumably because of chronic thrombocytopenia -Stable.  No chest pain.  Complete heart block  S/p PPM.    Depression -Continue Celexa   Hypothyroidism -Continue levothyroxine    Chronic ITP (idiopathic thrombocytopenia) -Platelets near baseline. -Monitor CBC   Mobility: Encourage ambulation.  PT eval ordered Living condition: Lives at home with husband Goals of care:   Code Status: Full Code  Nutritional status: Body mass index is 23.23 kg/m.        Discharge Exam:   Vitals:   12/19/21 0442 12/19/21 0528 12/19/21 0828 12/19/21 1039  BP: (!) 142/67 139/66 140/63   Pulse: 87 87 80 85  Resp:   18   Temp: 97.7 F (36.5 C) 97.9 F (36.6 C) 98.6 F (37 C)   TempSrc: Oral Oral Oral   SpO2: 100% 98% 97% 95%  Weight:      Height:        Body mass index is 23.23 kg/m.   General exam:  Pleasant, elderly Caucasian female.  Not in distress Skin: No rashes, lesions or ulcers. HEENT: Atraumatic, normocephalic, no obvious bleeding Lungs: Clear to auscultation bilaterally CVS: Regular rate and rhythm, no murmur GI/Abd soft, nontender, nondistended, bowel sound present CNS: Alert, awake, oriented to place and person, states this is January 2023 Psychiatry: Mood appropriate Extremities: No pedal edema, no calf tenderness  Follow ups:    Follow-up Information     Sparks, Leonie Douglas, MD Follow up.   Specialty: Internal Medicine Contact information: [facility] [place] [address] [phone]         Fay Records, MD .   Specialty: Cardiology Contact information: [address] [place] [address] [phone]         Evans Lance, MD .   Specialty: Cardiology Contact information: [phone] [address] [phone]         Derek Jack, MD Follow up.   Specialty: Hematology Contact information: [place] [address] [phone]         Judi Cong, MD Follow up.   Specialty:  Endocrinology Contact information: [address] [place] [address] [phone]                 Discharge Instructions:   Discharge Instructions     Call MD for:  difficulty breathing, headache or visual disturbances   Complete by: As directed    Call MD for:  extreme fatigue   Complete by: As directed    Call MD for:  hives   Complete by: As directed    Call MD for:  persistant dizziness or light-headedness   Complete by: As directed    Call MD for:  persistant nausea and vomiting   Complete by: As directed    Call MD for:  severe uncontrolled pain   Complete by: As directed    Call MD for:  temperature >100.4   Complete by: As directed    Diet - low sodium heart healthy   Complete by: As directed    Diet Carb  Modified   Complete by: As directed    Discharge instructions   Complete by: As directed    1. Your platelet count is running low because of an acute flareup of ITP.  Please watch out for any signs of bleeding or easy bruisability.  You have a follow-up appointment with Dr. Delton Coombes on Monday 2/6. 2. Keep your appointment with endocrinologist next week as you have previously reported  Discharge instructions for diabetes mellitus: Check blood sugar 3 times a day and bedtime at home. If blood sugar running above 200 or less than 70 please call your MD to adjust insulin. If you notice signs and symptoms of hypoglycemia (low blood sugar) like jitteriness, confusion, thirst, tremor and sweating, please check blood sugar, drink sugary drink/biscuits/sweets to increase sugar level and call MD or return to ER.    General discharge instructions: Follow with Primary MD Idelle Crouch, MD in 7 days  Please request your PCP  to go over your hospital tests, procedures, radiology results at the follow up. Please get your medicines reviewed and adjusted.  Your PCP may decide to repeat certain labs or tests as needed. Do not drive, operate heavy machinery, perform activities at heights, swimming or participation in water activities or provide baby sitting services if your were admitted for syncope or siezures until you have seen by Primary MD or a Neurologist and advised to do so again. [place] Controlled Substance Reporting System database was reviewed. Do not drive, operate heavy machinery, perform activities at heights, swim, participate in water activities or provide baby-sitting services while on medications for pain, sleep and mood until your outpatient physician has reevaluated you and advised to do so again.  You are strongly recommended to comply with the dose, frequency and duration of prescribed medications. Activity: As tolerated with Full fall precautions use walker/cane & assistance as  needed Avoid using any recreational substances like cigarette, tobacco, alcohol, or non-prescribed drug. If you experience worsening of your admission symptoms, develop shortness of breath, life threatening emergency, suicidal or homicidal thoughts you must seek medical attention immediately by calling 911 or calling your MD immediately  if symptoms less severe. You must read complete instructions/literature along with all the possible adverse reactions/side effects for all the medicines you take and that have been prescribed to you. Take any new medicine only after you have completely understood and accepted all the possible adverse reactions/side effects.  Wear Seat belts while driving. You were cared for by a hospitalist  during your hospital stay. If you have any questions about your discharge medications or the care you received while you were in the hospital after you are discharged, you can call the unit and ask to speak with the hospitalist or the covering physician. Once you are discharged, your primary care physician will handle any further medical issues. Please note that NO REFILLS for any discharge medications will be authorized once you are discharged, as it is imperative that you return to your primary care physician (or establish a relationship with a primary care physician if you do not have one).   Increase activity slowly   Complete by: As directed        Discharge Medications:   Allergies as of 12/19/2021       Reactions   [place]    "FEEL SICK"   Propoxyphene Nausea Only, Nausea And Vomiting   Sulfa Antibiotics Nausea Only, Nausea And Vomiting   "FEEL SICK"         Medication List     TAKE these medications    acetaminophen 325 MG tablet Commonly known as: TYLENOL Take 1-2 tablets (325-650 mg total) by mouth every 6 (six) hours as needed for mild pain or moderate pain.   atorvastatin 20 MG tablet Commonly known as: LIPITOR Take 1 tablet (20 mg total) by mouth  daily at 6 PM.   cetirizine 10 MG tablet Commonly known as: ZYRTEC Take 10 mg by mouth daily.   citalopram 20 MG tablet Commonly known as: CELEXA Take 20 mg by mouth daily.   Contour Next Test test strip Generic drug: glucose blood USE 1 STRIP TO CHECK BLOOD SUGAR EIGHT TO NINE TIMES DAILY.   insulin pump Soln Inject 1 each into the skin continuous. Used with Novolog insulin   Lancets 28G Misc Use as instructed. 7-8 times daily.   levothyroxine 50 MCG tablet Commonly known as: SYNTHROID Take 50 mcg by mouth daily before breakfast.   lisinopril 20 MG tablet Commonly known as: ZESTRIL Take 1 tablet (20 mg total) by mouth daily.   multivitamin with minerals Tabs tablet Take 1 tablet by mouth daily.   NovoLOG 100 UNIT/ML injection Generic drug: insulin aspart Inject 0-50 Units into the skin continuous. Up to 50 units/ day via pump         The results of significant diagnostics from this hospitalization (including imaging, microbiology, ancillary and laboratory) are listed below for reference.    Procedures and Diagnostic Studies:   ECHOCARDIOGRAM COMPLETE  Result Date: 12/18/2021    ECHOCARDIOGRAM REPORT   Patient Name:   PEGGE NIPPLE Date of Exam: 12/18/2021 Medical Rec #:  [id number]        Height:       62.0 in Accession #:    [id number]       Weight:       127.0 lb Date of Birth:  Aug 04, 1934         BSA:          1.576 m Patient Age:    54 years         BP:           157/68 mmHg Patient Gender: F                HR:           77 bpm. Exam Location:  [facility] Procedure: 2D Echo, Color Doppler and Cardiac Doppler Indications:     Elevated troponin  History:  Patient has prior history of Echocardiogram examinations. CAD,                  Pacemaker; Risk Factors:Diabetes and Dyslipidemia.  Sonographer:     Charmayne Sheer Referring Phys:  [id number] Terrilee Croak Diagnosing Phys: Ida Rogue MD  Sonographer Comments: Suboptimal parasternal window and no subcostal window.  IMPRESSIONS  1. Left ventricular ejection fraction, by estimation, is 60 to 65%. The left ventricle has normal function. The left ventricle has no regional wall motion abnormalities. There is mild left ventricular hypertrophy. Left ventricular diastolic parameters are consistent with Grade I diastolic dysfunction (impaired relaxation).  2. Right ventricular systolic function is normal. The right ventricular size is normal.  3. The mitral valve is normal in structure. No evidence of mitral valve regurgitation. Mild mitral stenosis. The mean mitral valve gradient is 6.0 mmHg. Moderate mitral annular calcification.  4. The aortic valve is normal in structure. Aortic valve regurgitation is not visualized. No aortic stenosis is present.  5. The inferior vena cava is normal in size with greater than 50% respiratory variability, suggesting right atrial pressure of 3 mmHg. FINDINGS  Left Ventricle: Left ventricular ejection fraction, by estimation, is 60 to 65%. The left ventricle has normal function. The left ventricle has no regional wall motion abnormalities. The left ventricular internal cavity size was normal in size. There is  mild left ventricular hypertrophy. Left ventricular diastolic parameters are consistent with Grade I diastolic dysfunction (impaired relaxation). Right Ventricle: The right ventricular size is normal. No increase in right ventricular wall thickness. Right ventricular systolic function is normal. Left Atrium: Left atrial size was normal in size. Right Atrium: Right atrial size was normal in size. Pericardium: There is no evidence of pericardial effusion. Mitral Valve: The mitral valve is normal in structure. Moderate mitral annular calcification. No evidence of mitral valve regurgitation. Mild mitral valve stenosis. MV peak gradient, 11.7 mmHg. The mean mitral valve gradient is 6.0 mmHg. Tricuspid Valve: The tricuspid valve is normal in structure. Tricuspid valve regurgitation is not  demonstrated. No evidence of tricuspid stenosis. Aortic Valve: The aortic valve is normal in structure. Aortic valve regurgitation is not visualized. No aortic stenosis is present. Aortic valve mean gradient measures 7.0 mmHg. Aortic valve peak gradient measures 13.1 mmHg. Aortic valve area, by VTI measures 1.73 cm. Pulmonic Valve: The pulmonic valve was normal in structure. Pulmonic valve regurgitation is not visualized. No evidence of pulmonic stenosis. Aorta: The aortic root is normal in size and structure. Venous: The inferior vena cava is normal in size with greater than 50% respiratory variability, suggesting right atrial pressure of 3 mmHg. IAS/Shunts: No atrial level shunt detected by color flow Doppler.  LEFT VENTRICLE PLAX 2D LVIDd:         4.26 cm     Diastology LVIDs:         3.64 cm     LV e' medial:    6.31 cm/s LV PW:         1.01 cm     LV E/e' medial:  20.8 LV IVS:        0.99 cm     LV e' lateral:   7.83 cm/s LVOT diam:     1.80 cm     LV E/e' lateral: 16.7 LV SV:         60 LV SV Index:   38 LVOT Area:     2.54 cm  LV Volumes (MOD) LV vol d, MOD A2C: 66.0 ml  LV vol d, MOD A4C: 81.5 ml LV vol s, MOD A2C: 36.2 ml LV vol s, MOD A4C: 43.0 ml LV SV MOD A2C:     29.8 ml LV SV MOD A4C:     81.5 ml LV SV MOD BP:      33.5 ml RIGHT VENTRICLE RV Basal diam:  3.02 cm RV S prime:     9.57 cm/s LEFT ATRIUM           Index        RIGHT ATRIUM          Index LA diam:      3.80 cm 2.41 cm/m   RA Area:     8.55 cm LA Vol (A2C): 34.8 ml 22.08 ml/m  RA Volume:   17.40 ml 11.04 ml/m  AORTIC VALVE                     PULMONIC VALVE AV Area (Vmax):    1.63 cm      PV Vmax:       0.88 m/s AV Area (Vmean):   1.72 cm      PV Vmean:      63.900 cm/s AV Area (VTI):     1.73 cm      PV VTI:        0.189 m AV Vmax:           181.00 cm/s   PV Peak grad:  3.1 mmHg AV Vmean:          122.000 cm/s  PV Mean grad:  2.0 mmHg AV VTI:            0.348 m AV Peak Grad:      13.1 mmHg AV Mean Grad:      7.0 mmHg LVOT Vmax:          116.00 cm/s LVOT Vmean:        82.500 cm/s LVOT VTI:          0.237 m LVOT/AV VTI ratio: 0.68  AORTA Ao Root diam: 2.80 cm MITRAL VALVE MV Area (PHT): 6.54 cm     SHUNTS MV Area VTI:   1.57 cm     Systemic VTI:  0.24 m MV Peak grad:  11.7 mmHg    Systemic Diam: 1.80 cm MV Mean grad:  6.0 mmHg MV Vmax:       1.71 m/s MV Vmean:      113.0 cm/s MV Decel Time: 116 msec MV E velocity: 131.00 cm/s MV A velocity: 139.00 cm/s MV E/A ratio:  0.94 Ida Rogue MD Electronically signed by Ida Rogue MD Signature Date/Time: 12/18/2021/6:25:03 PM    Final      Labs:   Basic Metabolic Panel: Recent Labs  Lab 12/15/21 1750 12/16/21 0008 12/16/21 0328 12/17/21 0450 12/18/21 0452 12/18/21 0951 12/19/21 0622  NA 131*   < > 136 138 143 138 133*  K 3.9   < > 4.3 3.7 2.9* 3.8 3.8  CL 98   < > 107 110 110 108 103  CO2 16*   < > 22 23 27 26 22   GLUCOSE 701*   < > 173* 320* 85 245* 357*  BUN 47*   < > 51* 25* 13 14 15   CREATININE 1.75*   < > 1.50* 0.82 0.64 0.82 0.70  CALCIUM 7.9*   < > 7.9* 8.0* 7.8* 7.6* 7.6*  MG 2.0  --   --  2.0  --   --   --  PHOS 5.8*  --   --   --   --   --   --    < > = values in this interval not displayed.   GFR Estimated Creatinine Clearance: 40.7 mL/min (by C-G formula based on SCr of 0.7 mg/dL). Liver Function Tests: Recent Labs  Lab 12/14/21 1912  AST 28  ALT 20  ALKPHOS 89  BILITOT 0.8  PROT 6.2*  ALBUMIN 3.7   No results for input(s): LIPASE, AMYLASE in the last 168 hours. No results for input(s): AMMONIA in the last 168 hours. Coagulation profile No results for input(s): INR, PROTIME in the last 168 hours.  CBC: Recent Labs  Lab 12/15/21 1415 12/17/21 0450 12/18/21 0452 12/18/21 0951 12/19/21 0622  WBC 15.5* 11.8* 8.6 8.3 5.7  NEUTROABS 13.3*  --  6.3 6.4 5.2  HGB 10.4* 10.5* 9.7* 9.8* 9.2*  HCT 33.3* 31.6* 28.9* 29.2* 27.9*  MCV 97.7 93.2 91.7 91.8 92.1  PLT 97* 48* 14* 11* 24*   Cardiac Enzymes: Recent Labs  Lab 12/16/21 0328   CKTOTAL 483*   BNP: Invalid input(s): POCBNP CBG: Recent Labs  Lab 12/18/21 1650 12/18/21 2057 12/19/21 0358 12/19/21 0830 12/19/21 1149  GLUCAP 163* 187* 380* 302* 225*   D-Dimer No results for input(s): DDIMER in the last 72 hours. Hgb A1c No results for input(s): HGBA1C in the last 72 hours. Lipid Profile No results for input(s): CHOL, HDL, LDLCALC, TRIG, CHOLHDL, LDLDIRECT in the last 72 hours. Thyroid function studies No results for input(s): TSH, T4TOTAL, T3FREE, THYROIDAB in the last 72 hours.  Invalid input(s): FREET3 Anemia work up No results for input(s): VITAMINB12, FOLATE, FERRITIN, TIBC, IRON, RETICCTPCT in the last 72 hours. Microbiology Recent Results (from the past 240 hour(s))  Resp Panel by RT-PCR (Flu A&B, Covid) Nasopharyngeal Swab     Status: None   Collection Time: 12/14/21  9:32 PM   Specimen: Nasopharyngeal Swab; Nasopharyngeal(NP) swabs in vial transport medium  Result Value Ref Range Status   SARS Coronavirus 2 by RT PCR NEGATIVE NEGATIVE Final    Comment: (NOTE) SARS-CoV-2 target nucleic acids are NOT DETECTED.  The SARS-CoV-2 RNA is generally detectable in upper respiratory specimens during the acute phase of infection. The lowest concentration of SARS-CoV-2 viral copies this assay can detect is 138 copies/mL. A negative result does not preclude SARS-Cov-2 infection and should not be used as the sole basis for treatment or other patient management decisions. A negative result may occur with  improper specimen collection/handling, submission of specimen other than nasopharyngeal swab, presence of viral mutation(s) within the areas targeted by this assay, and inadequate number of viral copies(<138 copies/mL). A negative result must be combined with clinical observations, patient history, and epidemiological information. The expected result is Negative.  Fact Sheet for Patients:  EntrepreneurPulse.com.au  Fact Sheet for  Healthcare Providers:  IncredibleEmployment.be  This test is no t yet approved or cleared by the [place] FDA and  has been authorized for detection and/or diagnosis of SARS-CoV-2 by FDA under an Emergency Use Authorization (EUA). This EUA will remain  in effect (meaning this test can be used) for the duration of the COVID-19 declaration under Section 564(b)(1) of the Act, 21 U.S.C.section 360bbb-3(b)(1), unless the authorization is terminated  or revoked sooner.       Influenza A by PCR NEGATIVE NEGATIVE Final   Influenza B by PCR NEGATIVE NEGATIVE Final    Comment: (NOTE) The Xpert Xpress SARS-CoV-2/FLU/RSV plus assay is intended as an aid  in the diagnosis of influenza from Nasopharyngeal swab specimens and should not be used as a sole basis for treatment. Nasal washings and aspirates are unacceptable for Xpert Xpress SARS-CoV-2/FLU/RSV testing.  Fact Sheet for Patients: EntrepreneurPulse.com.au  Fact Sheet for Healthcare Providers: IncredibleEmployment.be  This test is not yet approved or cleared by the [place] FDA and has been authorized for detection and/or diagnosis of SARS-CoV-2 by FDA under an Emergency Use Authorization (EUA). This EUA will remain in effect (meaning this test can be used) for the duration of the COVID-19 declaration under Section 564(b)(1) of the Act, 21 U.S.C. section 360bbb-3(b)(1), unless the authorization is terminated or revoked.  Performed at [facility], [address] [place], [place] [address]   Resp Panel by RT-PCR (Flu A&B, Covid) Nasopharyngeal Swab     Status: None   Collection Time: 12/15/21  2:15 PM   Specimen: Nasopharyngeal Swab; Nasopharyngeal(NP) swabs in vial transport medium  Result Value Ref Range Status   SARS Coronavirus 2 by RT PCR NEGATIVE NEGATIVE Final    Comment: (NOTE) SARS-CoV-2 target nucleic acids are NOT DETECTED.  The SARS-CoV-2 RNA is  generally detectable in upper respiratory specimens during the acute phase of infection. The lowest concentration of SARS-CoV-2 viral copies this assay can detect is 138 copies/mL. A negative result does not preclude SARS-Cov-2 infection and should not be used as the sole basis for treatment or other patient management decisions. A negative result may occur with  improper specimen collection/handling, submission of specimen other than nasopharyngeal swab, presence of viral mutation(s) within the areas targeted by this assay, and inadequate number of viral copies(<138 copies/mL). A negative result must be combined with clinical observations, patient history, and epidemiological information. The expected result is Negative.  Fact Sheet for Patients:  EntrepreneurPulse.com.au  Fact Sheet for Healthcare Providers:  IncredibleEmployment.be  This test is no t yet approved or cleared by the [place] FDA and  has been authorized for detection and/or diagnosis of SARS-CoV-2 by FDA under an Emergency Use Authorization (EUA). This EUA will remain  in effect (meaning this test can be used) for the duration of the COVID-19 declaration under Section 564(b)(1) of the Act, 21 U.S.C.section 360bbb-3(b)(1), unless the authorization is terminated  or revoked sooner.       Influenza A by PCR NEGATIVE NEGATIVE Final   Influenza B by PCR NEGATIVE NEGATIVE Final    Comment: (NOTE) The Xpert Xpress SARS-CoV-2/FLU/RSV plus assay is intended as an aid in the diagnosis of influenza from Nasopharyngeal swab specimens and should not be used as a sole basis for treatment. Nasal washings and aspirates are unacceptable for Xpert Xpress SARS-CoV-2/FLU/RSV testing.  Fact Sheet for Patients: EntrepreneurPulse.com.au  Fact Sheet for Healthcare Providers: IncredibleEmployment.be  This test is not yet approved or cleared by the [place] FDA and has been authorized for detection and/or diagnosis of SARS-CoV-2 by FDA under an Emergency Use Authorization (EUA). This EUA will remain in effect (meaning this test can be used) for the duration of the COVID-19 declaration under Section 564(b)(1) of the Act, 21 U.S.C. section 360bbb-3(b)(1), unless the authorization is terminated or revoked.  Performed at [facility], [address] [place], [place] [address]   MRSA Next Gen by PCR, Nasal     Status: None   Collection Time: 12/16/21  7:00 AM   Specimen: Nasal Mucosa; Nasal Swab  Result Value Ref Range Status   MRSA by PCR Next Gen NOT DETECTED NOT DETECTED Final  Comment: (NOTE) The GeneXpert MRSA Assay (FDA approved for NASAL specimens only), is one component of a comprehensive MRSA colonization surveillance program. It is not intended to diagnose MRSA infection nor to guide or monitor treatment for MRSA infections. Test performance is not FDA approved in patients less than 66 years old. Performed at [facility], [address] [place], [place] [address]   Urine Culture     Status: Abnormal   Collection Time: 12/17/21 12:00 PM   Specimen: Urine, Random  Result Value Ref Range Status   Specimen Description   Final    URINE, RANDOM Performed at [facility], [address] [place], [place] [address]    Special Requests   Final    NONE Performed at [facility], [place], [place] [address]    Culture 30,000 COLONIES/mL ENTEROCOCCUS FAECALIS (A)  Final   Report Status 12/19/2021 FINAL  Final   Organism ID, Bacteria ENTEROCOCCUS FAECALIS (A)  Final      Susceptibility   Enterococcus faecalis - MIC*    AMPICILLIN <=2 SENSITIVE Sensitive     NITROFURANTOIN <=16 SENSITIVE Sensitive     VANCOMYCIN 1 SENSITIVE Sensitive     * 30,000 COLONIES/mL ENTEROCOCCUS FAECALIS    Time coordinating discharge: 35 minutes  Signed: Jonnae Fonseca  [facility] 12/19/2021, 2:14 PM

## 2021-12-22 ENCOUNTER — Inpatient Hospital Stay (HOSPITAL_BASED_OUTPATIENT_CLINIC_OR_DEPARTMENT_OTHER): Payer: HMO | Admitting: Hematology

## 2021-12-22 ENCOUNTER — Telehealth (HOSPITAL_COMMUNITY): Payer: Self-pay | Admitting: Pharmacy Technician

## 2021-12-22 ENCOUNTER — Inpatient Hospital Stay (HOSPITAL_COMMUNITY): Payer: HMO

## 2021-12-22 ENCOUNTER — Inpatient Hospital Stay (HOSPITAL_COMMUNITY): Payer: HMO | Attending: Hematology

## 2021-12-22 ENCOUNTER — Other Ambulatory Visit (HOSPITAL_COMMUNITY): Payer: Self-pay

## 2021-12-22 ENCOUNTER — Encounter (HOSPITAL_COMMUNITY): Payer: Self-pay | Admitting: Internal Medicine

## 2021-12-22 ENCOUNTER — Other Ambulatory Visit: Payer: Self-pay

## 2021-12-22 VITALS — BP 157/74 | HR 81 | Temp 99.1°F | Resp 16 | Ht 62.0 in | Wt 117.3 lb

## 2021-12-22 DIAGNOSIS — Z79899 Other long term (current) drug therapy: Secondary | ICD-10-CM | POA: Diagnosis not present

## 2021-12-22 DIAGNOSIS — R233 Spontaneous ecchymoses: Secondary | ICD-10-CM | POA: Insufficient documentation

## 2021-12-22 DIAGNOSIS — D649 Anemia, unspecified: Secondary | ICD-10-CM

## 2021-12-22 DIAGNOSIS — D693 Immune thrombocytopenic purpura: Secondary | ICD-10-CM

## 2021-12-22 DIAGNOSIS — R911 Solitary pulmonary nodule: Secondary | ICD-10-CM | POA: Diagnosis not present

## 2021-12-22 DIAGNOSIS — E039 Hypothyroidism, unspecified: Secondary | ICD-10-CM | POA: Insufficient documentation

## 2021-12-22 DIAGNOSIS — F17210 Nicotine dependence, cigarettes, uncomplicated: Secondary | ICD-10-CM | POA: Diagnosis not present

## 2021-12-22 DIAGNOSIS — D696 Thrombocytopenia, unspecified: Secondary | ICD-10-CM

## 2021-12-22 LAB — COMPREHENSIVE METABOLIC PANEL
ALT: 25 U/L (ref 0–44)
AST: 34 U/L (ref 15–41)
Albumin: 3 g/dL — ABNORMAL LOW (ref 3.5–5.0)
Alkaline Phosphatase: 73 U/L (ref 38–126)
Anion gap: 10 (ref 5–15)
BUN: 11 mg/dL (ref 8–23)
CO2: 26 mmol/L (ref 22–32)
Calcium: 8.2 mg/dL — ABNORMAL LOW (ref 8.9–10.3)
Chloride: 103 mmol/L (ref 98–111)
Creatinine, Ser: 0.6 mg/dL (ref 0.44–1.00)
GFR, Estimated: >60 mL/min (ref >60)
Glucose, Bld: 94 mg/dL (ref 70–99)
Potassium: 3.3 mmol/L — ABNORMAL LOW (ref 3.5–5.1)
Sodium: 139 mmol/L (ref 135–145)
Total Bilirubin: 0.3 mg/dL (ref 0.3–1.2)
Total Protein: 6.9 g/dL (ref 6.5–8.1)

## 2021-12-22 LAB — CBC WITH DIFFERENTIAL/PLATELET
Abs Immature Granulocytes: 0.05 10*3/uL (ref 0.00–0.07)
Basophils Absolute: 0 10*3/uL (ref 0.0–0.1)
Basophils Relative: 0 %
Eosinophils Absolute: 0.1 10*3/uL (ref 0.0–0.5)
Eosinophils Relative: 1 %
HCT: 36.3 % (ref 36.0–46.0)
Hemoglobin: 11.6 g/dL — ABNORMAL LOW (ref 12.0–15.0)
Immature Granulocytes: 1 %
Lymphocytes Relative: 28 %
Lymphs Abs: 1.7 10*3/uL (ref 0.7–4.0)
MCH: 29.8 pg (ref 26.0–34.0)
MCHC: 32 g/dL (ref 30.0–36.0)
MCV: 93.3 fL (ref 80.0–100.0)
Monocytes Absolute: 0.6 10*3/uL (ref 0.1–1.0)
Monocytes Relative: 11 %
Neutro Abs: 3.6 10*3/uL (ref 1.7–7.7)
Neutrophils Relative %: 59 %
Platelets: 5 10*3/uL — CL (ref 150–400)
RBC: 3.89 MIL/uL (ref 3.87–5.11)
RDW: 13.1 % (ref 11.5–15.5)
WBC: 6 10*3/uL (ref 4.0–10.5)
nRBC: 0 % (ref 0.0–0.2)

## 2021-12-22 LAB — LACTATE DEHYDROGENASE: LDH: 223 U/L — ABNORMAL HIGH (ref 98–192)

## 2021-12-22 MED ORDER — ROMIPLOSTIM 125 MCG ~~LOC~~ SOLR
2.0000 ug/kg | Freq: Once | SUBCUTANEOUS | Status: AC
  Administered 2021-12-22: 105 ug via SUBCUTANEOUS
  Filled 2021-12-22: qty 0.21

## 2021-12-22 MED ORDER — ELTROMBOPAG OLAMINE 25 MG PO TABS
25.0000 mg | ORAL_TABLET | Freq: Every day | ORAL | 0 refills | Status: DC
  Filled 2021-12-22 – 2021-12-23 (×2): qty 30, 30d supply, fill #0

## 2021-12-22 NOTE — Progress Notes (Signed)
[place], [place] [address]   CLINIC:  Medical Oncology/Hematology  PCP:  Idelle Crouch, MD [facility] / [place] [phone]  REASON FOR VISIT:  Follow-up for chronic ITP with exacerbations  PRIOR THERAPY: none  CURRENT THERAPY: surveillance  INTERVAL HISTORY:  Madeline [name], a 88 y.o. female, returns for routine follow-up for Madeline [name] chronic ITP with exacerbations. Madeline [name] was last seen on 01/17/2019.  Today Madeline [name] reports feeling well. Madeline [name] reports fatigue and weakness. Madeline [name] denies diarrhea, bleeding, and black stools.   REVIEW OF SYSTEMS:  Review of Systems  Constitutional:  Positive for fatigue. Negative for appetite change.  HENT:   Negative for nosebleeds.   Respiratory:  Positive for cough. Negative for hemoptysis.   Gastrointestinal:  Negative for blood in stool and diarrhea.  Genitourinary:  Negative for hematuria and vaginal bleeding.   Hematological:  Does not bruise/bleed easily.  All other systems reviewed and are negative.  PAST MEDICAL/SURGICAL HISTORY:  Past Medical History:  Diagnosis Date   Bradycardia    Coronary artery disease    Diabetes mellitus    Dyslipidemia    GERD (gastroesophageal reflux disease)    Hypothyroid    Presence of permanent cardiac pacemaker 12/16/2018   Medtronic dual lead pacemaker   Past Surgical History:  Procedure Laterality Date   APPENDECTOMY     BREAST BIOPSY Right 1967   EXCISIONAL - NEG   CHOLECYSTECTOMY     CORONARY STENT INTERVENTION     INSERT / REPLACE / REMOVE PACEMAKER  12/16/2018   no surgical hx     PACEMAKER IMPLANT N/A 12/16/2018   Procedure: PACEMAKER IMPLANT;  Surgeon: Evans Lance, MD;  Location: [facility];  Service: Cardiovascular;  Laterality: N/A;    SOCIAL HISTORY:  Social History   Socioeconomic History   Marital status: Married    Spouse name: Not on file   Number of children: Not on file    Years of education: Not on file   Highest education level: Not on file  Occupational History   Not on file  Tobacco Use   Smoking status: Every Day    Packs/day: 1.00    Years: 50.00    Pack years: 50.00    Types: Cigarettes   Smokeless tobacco: Never  Vaping Use   Vaping Use: Never used  Substance and Sexual Activity   Alcohol use: No   Drug use: No   Sexual activity: Not on file  Other Topics Concern   Not on file  Social History Narrative   Not on file   Social Determinants of Health   Financial Resource Strain: Not on file  Food Insecurity: Not on file  Transportation Needs: Not on file  Physical Activity: Not on file  Stress: Not on file  Social Connections: Not on file  Intimate Partner Violence: Not on file    FAMILY HISTORY:  Family History  Problem Relation Age of Onset   Heart Problems Mother    Other Father        UNK   Cancer Sister        BREAST   Breast cancer Sister 62   Cancer Sister        BREAST   Breast cancer Sister 30   Cancer Sister        BREAST   Diabetes Son     CURRENT MEDICATIONS:  Current Outpatient Medications  Medication Sig Dispense Refill   acetaminophen (TYLENOL) 325 MG tablet Take 1-2 tablets (325-650 mg total) by mouth every 6 (six) hours as needed for mild pain or moderate pain.     atorvastatin (LIPITOR) 20 MG tablet Take 1 tablet (20 mg total) by mouth daily at 6 PM. 90 tablet 3   cetirizine (ZYRTEC) 10 MG tablet Take 10 mg by mouth daily.     citalopram (CELEXA) 20 MG tablet Take 20 mg by mouth daily.     CONTOUR NEXT TEST test strip USE 1 STRIP TO CHECK BLOOD SUGAR EIGHT TO NINE TIMES DAILY.     Insulin Human (INSULIN PUMP) SOLN Inject 1 each into the skin continuous. Used with Novolog insulin     Lancets 28G MISC Use as instructed. 7-8 times daily.     levothyroxine (SYNTHROID, LEVOTHROID) 50 MCG tablet Take 50 mcg by mouth daily before breakfast.   3   Multiple Vitamin (MULTIVITAMIN WITH MINERALS) TABS tablet Take  1 tablet by mouth daily.     NOVOLOG 100 UNIT/ML injection Inject 0-50 Units into the skin continuous. Up to 50 units/ day via pump 10 mL 0   lisinopril (PRINIVIL,ZESTRIL) 20 MG tablet Take 1 tablet (20 mg total) by mouth daily. 30 tablet 1   No current facility-administered medications for this visit.    ALLERGIES:  Allergies  Allergen Reactions   Darvon     "FEEL SICK"   Propoxyphene Nausea Only and Nausea And Vomiting   Sulfa Antibiotics Nausea Only and Nausea And Vomiting    "FEEL SICK"     PHYSICAL EXAM:  Performance status (ECOG): 1 - Symptomatic but completely ambulatory  Vitals:   12/22/21 1321  BP: (!) 157/74  Pulse: 81  Resp: 16  Temp: 99.1 F (37.3 C)  SpO2: 98%   Wt Readings from Last 3 Encounters:  12/22/21 117 lb 4.6 oz (53.2 kg)  12/16/21 126 lb 15.8 oz (57.6 kg)  12/14/21 114 lb 10.2 oz (52 kg)   Physical Exam Vitals reviewed.  Constitutional:      Appearance: Normal appearance.  HENT:     Mouth/Throat:     Mouth: Mucous membranes are moist. Oral lesions (petechial lesions on buckle surface bilaterally) present.  Cardiovascular:     Rate and Rhythm: Normal rate and regular rhythm.     Pulses: Normal pulses.     Heart sounds: Normal heart sounds.  Pulmonary:     Effort: Pulmonary effort is normal.     Breath sounds: Normal breath sounds.  Neurological:     General: No focal deficit present.     Mental Status: Madeline [name] is alert and oriented to person, place, and time.  Psychiatric:        Mood and Affect: Mood normal.        Behavior: Behavior normal.    LABORATORY DATA:  I have reviewed the labs as listed.  CBC Latest Ref Rng & Units 12/22/2021 12/19/2021 12/18/2021  WBC 4.0 - 10.5 K/uL 6.0 5.7 8.3  Hemoglobin 12.0 - 15.0 g/dL 11.6(L) 9.2(L) 9.8(L)  Hematocrit 36.0 - 46.0 % 36.3 27.9(L) 29.2(L)  Platelets 150 - 400 K/uL 5(LL) 24(LL) 11(LL)   CMP Latest Ref Rng & Units 12/22/2021 12/19/2021 12/18/2021  Glucose 70 - 99 mg/dL 94 357(H) 245(H)  BUN 8 - 23  mg/dL 11 15 14   Creatinine 0.44 - 1.00 mg/dL 0.60 0.70 0.82  Sodium 135 - 145 mmol/L 139 133(L) 138  Potassium 3.5 - 5.1 mmol/L 3.3(L) 3.8 3.8  Chloride 98 - 111 mmol/L 103 103 108  CO2 22 - 32 mmol/L 26 22 26   Calcium 8.9 - 10.3 mg/dL 8.2(L) 7.6(L) 7.6(L)  Total Protein 6.5 - 8.1 g/dL 6.9 - -  Total Bilirubin 0.3 - 1.2 mg/dL 0.3 - -  Alkaline Phos 38 - 126 U/L 73 - -  AST 15 - 41 U/L 34 - -  ALT 0 - 44 U/L 25 - -      Component Value Date/Time   RBC 3.89 12/22/2021 1148   MCV 93.3 12/22/2021 1148   MCV 92 12/09/2018 1308   MCH 29.8 12/22/2021 1148   MCHC 32.0 12/22/2021 1148   RDW 13.1 12/22/2021 1148   RDW 12.2 12/09/2018 1308   LYMPHSABS 1.7 12/22/2021 1148   LYMPHSABS 2.0 12/09/2018 1308   MONOABS 0.6 12/22/2021 1148   EOSABS 0.1 12/22/2021 1148   EOSABS 0.2 12/09/2018 1308   BASOSABS 0.0 12/22/2021 1148   BASOSABS 0.0 12/09/2018 1308    DIAGNOSTIC IMAGING:  I have independently reviewed the scans and discussed with the patient. CT HEAD WO CONTRAST (5MM)  Result Date: 12/15/2021 CLINICAL DATA:  Sudden severe headache EXAM: CT HEAD WITHOUT CONTRAST TECHNIQUE: Contiguous axial images were obtained from the base of the skull through the vertex without intravenous contrast. RADIATION DOSE REDUCTION: This exam was performed according to the departmental dose-optimization program which includes automated exposure control, adjustment of the mA and/or kV according to patient size and/or use of iterative reconstruction technique. COMPARISON:  CT head dated September 06, 2012 FINDINGS: Brain: No evidence of acute infarction, hemorrhage, hydrocephalus, extra-axial collection or mass lesion/mass effect. Low attenuation of periventricular and subcortical white matter presumed chronic microvascular ischemic changes. Minimal scattered bilateral dystrophic basal ganglia calcifications. Vascular: No hyperdense vessel or unexpected calcification. Skull: Normal. Negative for fracture or focal  lesion. Sinuses/Orbits: No acute finding. Other: None IMPRESSION: 1.  No acute intracranial abnormality. 2.  Mild chronic microvascular ischemic changes of the white matter. Electronically Signed   By: Keane Police D.O.   On: 12/15/2021 14:47   Portable chest x-ray (1 view)  Result Date: 12/15/2021 CLINICAL DATA:  DKA EXAM: PORTABLE CHEST 1 VIEW COMPARISON:  Chest x-ray 12/17/2018, CT chest 08/08/2018 FINDINGS: Left chest wall dual lead pacemaker in similar position. The heart and mediastinal contours are unchanged. Aortic calcification. Severe mitral annular calcifications. Biapical pleural/pulmonary scarring. No focal consolidation. Chronic coarsened interstitial markings. No pleural effusion. No pneumothorax. No acute osseous abnormality. IMPRESSION: 1. No active disease. 2. Aortic Atherosclerosis (ICD10-I70.0) including mitral annular calcification. Electronically Signed   By: Iven Finn M.D.   On: 12/15/2021 17:21   ECHOCARDIOGRAM COMPLETE  Result Date: 12/18/2021    ECHOCARDIOGRAM REPORT   Patient Name:   EARL HAVICE Date of Exam: 12/18/2021 Medical Rec #:  [id number]        Height:       62.0 in Accession #:    [id number]       Weight:       127.0 lb Date of Birth:  December 15, 1933         BSA:          1.576 m Patient Age:    67 years         BP:           157/68 mmHg Patient Gender: F                HR:           77  bpm. Exam Location:  [facility] Procedure: 2D Echo, Color Doppler and Cardiac Doppler Indications:     Elevated troponin  History:         Patient has prior history of Echocardiogram examinations. CAD,                  Pacemaker; Risk Factors:Diabetes and Dyslipidemia.  Sonographer:     Charmayne Sheer Referring Phys:  [id number] Terrilee Croak Diagnosing Phys: Ida Rogue MD  Sonographer Comments: Suboptimal parasternal window and no subcostal window. IMPRESSIONS  1. Left ventricular ejection fraction, by estimation, is 60 to 65%. The left ventricle has normal function. The left ventricle has no  regional wall motion abnormalities. There is mild left ventricular hypertrophy. Left ventricular diastolic parameters are consistent with Grade I diastolic dysfunction (impaired relaxation).  2. Right ventricular systolic function is normal. The right ventricular size is normal.  3. The mitral valve is normal in structure. No evidence of mitral valve regurgitation. Mild mitral stenosis. The mean mitral valve gradient is 6.0 mmHg. Moderate mitral annular calcification.  4. The aortic valve is normal in structure. Aortic valve regurgitation is not visualized. No aortic stenosis is present.  5. The inferior vena cava is normal in size with greater than 50% respiratory variability, suggesting right atrial pressure of 3 mmHg. FINDINGS  Left Ventricle: Left ventricular ejection fraction, by estimation, is 60 to 65%. The left ventricle has normal function. The left ventricle has no regional wall motion abnormalities. The left ventricular internal cavity size was normal in size. There is  mild left ventricular hypertrophy. Left ventricular diastolic parameters are consistent with Grade I diastolic dysfunction (impaired relaxation). Right Ventricle: The right ventricular size is normal. No increase in right ventricular wall thickness. Right ventricular systolic function is normal. Left Atrium: Left atrial size was normal in size. Right Atrium: Right atrial size was normal in size. Pericardium: There is no evidence of pericardial effusion. Mitral Valve: The mitral valve is normal in structure. Moderate mitral annular calcification. No evidence of mitral valve regurgitation. Mild mitral valve stenosis. MV peak gradient, 11.7 mmHg. The mean mitral valve gradient is 6.0 mmHg. Tricuspid Valve: The tricuspid valve is normal in structure. Tricuspid valve regurgitation is not demonstrated. No evidence of tricuspid stenosis. Aortic Valve: The aortic valve is normal in structure. Aortic valve regurgitation is not visualized. No aortic  stenosis is present. Aortic valve mean gradient measures 7.0 mmHg. Aortic valve peak gradient measures 13.1 mmHg. Aortic valve area, by VTI measures 1.73 cm. Pulmonic Valve: The pulmonic valve was normal in structure. Pulmonic valve regurgitation is not visualized. No evidence of pulmonic stenosis. Aorta: The aortic root is normal in size and structure. Venous: The inferior vena cava is normal in size with greater than 50% respiratory variability, suggesting right atrial pressure of 3 mmHg. IAS/Shunts: No atrial level shunt detected by color flow Doppler.  LEFT VENTRICLE PLAX 2D LVIDd:         4.26 cm     Diastology LVIDs:         3.64 cm     LV e' medial:    6.31 cm/s LV PW:         1.01 cm     LV E/e' medial:  20.8 LV IVS:        0.99 cm     LV e' lateral:   7.83 cm/s LVOT diam:     1.80 cm     LV E/e' lateral: 16.7 LV SV:  60 LV SV Index:   38 LVOT Area:     2.54 cm  LV Volumes (MOD) LV vol d, MOD A2C: 66.0 ml LV vol d, MOD A4C: 81.5 ml LV vol s, MOD A2C: 36.2 ml LV vol s, MOD A4C: 43.0 ml LV SV MOD A2C:     29.8 ml LV SV MOD A4C:     81.5 ml LV SV MOD BP:      33.5 ml RIGHT VENTRICLE RV Basal diam:  3.02 cm RV S prime:     9.57 cm/s LEFT ATRIUM           Index        RIGHT ATRIUM          Index LA diam:      3.80 cm 2.41 cm/m   RA Area:     8.55 cm LA Vol (A2C): 34.8 ml 22.08 ml/m  RA Volume:   17.40 ml 11.04 ml/m  AORTIC VALVE                     PULMONIC VALVE AV Area (Vmax):    1.63 cm      PV Vmax:       0.88 m/s AV Area (Vmean):   1.72 cm      PV Vmean:      63.900 cm/s AV Area (VTI):     1.73 cm      PV VTI:        0.189 m AV Vmax:           181.00 cm/s   PV Peak grad:  3.1 mmHg AV Vmean:          122.000 cm/s  PV Mean grad:  2.0 mmHg AV VTI:            0.348 m AV Peak Grad:      13.1 mmHg AV Mean Grad:      7.0 mmHg LVOT Vmax:         116.00 cm/s LVOT Vmean:        82.500 cm/s LVOT VTI:          0.237 m LVOT/AV VTI ratio: 0.68  AORTA Ao Root diam: 2.80 cm MITRAL VALVE MV Area (PHT): 6.54  cm     SHUNTS MV Area VTI:   1.57 cm     Systemic VTI:  0.24 m MV Peak grad:  11.7 mmHg    Systemic Diam: 1.80 cm MV Mean grad:  6.0 mmHg MV Vmax:       1.71 m/s MV Vmean:      113.0 cm/s MV Decel Time: 116 msec MV E velocity: 131.00 cm/s MV A velocity: 139.00 cm/s MV E/A ratio:  0.94 Ida Rogue MD Electronically signed by Ida Rogue MD Signature Date/Time: 12/18/2021/6:25:03 PM    Final    CUP PACEART REMOTE DEVICE CHECK  Result Date: 12/12/2021 Scheduled remote reviewed. Normal device function.  Next remote 91 days. LA    ASSESSMENT:  1.  Chronic ITP with exacerbations: Patient had thrombocytopenia since July 2016.  CT abdomen on 02/02/2018 showed normal-sized spleen.  Bone marrow aspiration and biopsy in 09/15/2017 showed hypercellular marrow for age, trilineage hematopoiesis with abundant megakaryocytes with no dysplasia. - Prednisone tapering dose over 5 weeks given on 12/14/2017 with no improvement in platelet count - IVIG (1 g/kg) on 12/23/2017 and 12/24/2017 with peak platelet count of 92, response lasting about 3 weeks -Dexamethasone 40 mg for 4 days on 01/11/2018 with  improvement in platelet count of 61. - IVIG 1 g/kg on 12/18/2021 and 12/19/2021 without response.    2.  Right middle lobe lung nodule: -CT of the chest on 08/08/2018 showed right middle lobe lung nodule with one-year follow-up recommended.    PLAN:  1.  Chronic ITP with exacerbations: - Madeline [name] was recently hospitalized for DKA.  During that time Madeline [name] platelet count decreased to 14.  Madeline [name] received IVIG 1 g/kg on 12/18/2021 and 12/19/2021.  Madeline [name] also received 2 doses of dexamethasone 40 mg. - Madeline [name] was discharged home on 12/19/2021 when Madeline [name] platelet count was 24 K. - Madeline [name] platelets today are 5K.  White count and hemoglobin was normal.  LFTs were grossly normal. - Oropharyngeal exam shows small petechial lesions in the buccal mucosa bilaterally.  No active bleeding.  1 small subcentimeter ecchymosis below the left lower lip region. - Madeline [name]  did not get any benefit from IVIG. - We talked about starting Madeline [name] on Nplate.  We will start Madeline [name] on 2 mcg/kg dose today.  Will recheck platelet count Thursday. - We also talked to Madeline [name] about starting Madeline [name] on Promacta.  We will start Madeline [name] at 25 mg daily and titrate up based on response. - We discussed side effects of both agents in detail. - RTC 1 week for follow-up.  2.  Right middle lobe lung nodule: - Madeline [name] did not have any repeat CT scan after 08/08/2018. - We will consider repeating CT scan without contrast in the near future.    Orders placed this encounter:  No orders of the defined types were placed in this encounter.    Derek Jack, MD [place] [phone]   I, Thana Ates, am acting as a scribe for Dr. Derek Jack.  I, Derek Jack MD, have reviewed the above documentation for accuracy and completeness, and I agree with the above.

## 2021-12-22 NOTE — Progress Notes (Signed)
Remote pacemaker transmission.   

## 2021-12-22 NOTE — Progress Notes (Signed)
Madeline [name] presents today for injection per the provider's orders.  Nplate administration without incident; injection site WNL; see MAR for injection details.  Patient tolerated procedure well and without incident.  No questions or complaints noted at this time.   Discharged from clinic ambulatory in stable condition. Alert and oriented x 3. F/U with [facility] as scheduled.

## 2021-12-22 NOTE — Patient Instructions (Signed)
[facility]  Discharge Instructions: Thank you for choosing [facility] to provide your oncology and hematology care.  If you have a lab appointment with the Cancer Center, please come in thru the Main Entrance and check in at the main information desk.  Wear comfortable clothing and clothing appropriate for easy access to any Portacath or PICC line.   We strive to give you quality time with your provider. You may need to reschedule your appointment if you arrive late (15 or more minutes).  Arriving late affects you and other patients whose appointments are after yours.  Also, if you miss three or more appointments without notifying the office, you may be dismissed from the clinic at the providers discretion.      For prescription refill requests, have your pharmacy contact our office and allow 72 hours for refills to be completed.    Today you received the Nplate     BELOW ARE SYMPTOMS THAT SHOULD BE REPORTED IMMEDIATELY: *FEVER GREATER THAN 100.4 F (38 C) OR HIGHER *CHILLS OR SWEATING *NAUSEA AND VOMITING THAT IS NOT CONTROLLED WITH YOUR NAUSEA MEDICATION *UNUSUAL SHORTNESS OF BREATH *UNUSUAL BRUISING OR BLEEDING *URINARY PROBLEMS (pain or burning when urinating, or frequent urination) *BOWEL PROBLEMS (unusual diarrhea, constipation, pain near the anus) TENDERNESS IN MOUTH AND THROAT WITH OR WITHOUT PRESENCE OF ULCERS (sore throat, sores in mouth, or a toothache) UNUSUAL RASH, SWELLING OR PAIN  UNUSUAL VAGINAL DISCHARGE OR ITCHING   Items with * indicate a potential emergency and should be followed up as soon as possible or go to the Emergency Department if any problems should occur.  Please show the CHEMOTHERAPY ALERT CARD or IMMUNOTHERAPY ALERT CARD at check-in to the Emergency Department and triage nurse.  Should you have questions after your visit or need to cancel or reschedule your appointment, please contact [facility] [phone]   and follow the prompts.  Office hours are 8:00 a.m. to 4:30 p.m. Monday - Friday. Please note that voicemails left after 4:00 p.m. may not be returned until the following business day.  We are closed weekends and major holidays. You have access to a nurse at all times for urgent questions. Please call the main number to the clinic [phone] and follow the prompts.  For any non-urgent questions, you may also contact your provider using MyChart. We now offer e-Visits for anyone 11 and older to request care online for non-urgent symptoms. For details visit [email or web address].   Also download the MyChart app! Go to the app store, search "MyChart", open the app, select [facility], and log in with your MyChart username and password.  Due to Covid, a mask is required upon entering the hospital/clinic. If you do not have a mask, one will be given to you upon arrival. For doctor visits, patients may have 1 support person aged 48 or older with them. For treatment visits, patients cannot have anyone with them due to current Covid guidelines and our immunocompromised population.

## 2021-12-22 NOTE — Telephone Encounter (Signed)
Oral Oncology Patient Advocate Encounter   Received notification from [employer] that prior authorization for Promacta is required.   PA submitted on CoverMyMeds Key [id number]  Status is pending   Oral Oncology Clinic will continue to follow.  Daine Floras CPHT [facility] Patient Advocate [facility] Phone [phone] Fax [phone] 12/23/2021 9:19 AM

## 2021-12-22 NOTE — Patient Instructions (Addendum)
[facility] at [facility] Discharge Instructions   You were seen and examined today by Dr. Ellin Saba.  He reviewed your lab work with you.  Your platelet count is less than 5000.  We will give you a shot today called Nplate - it will help get your platelet count up. It will take this shot 3-4 days to work.  We will recheck your lab work on Thursday to see how your platelet count has responded.  If you have any bleeding during this time or any severe headaches, please report to the ER immediately. Avoid any objects or activities that would put you at risk for injury/bleeding.   We will send a prescription to a specialty pharmacy called Promacta.  We will start you on a low dose.  This medication is to help get your platelets up to a safe level.   Return as scheduled for lab work and office visit.      Thank you for choosing [facility] at [facility] to provide your oncology and hematology care.  To afford each patient quality time with our provider, please arrive at least 15 minutes before your scheduled appointment time.   If you have a lab appointment with the Cancer Center please come in thru the Main Entrance and check in at the main information desk.  You need to re-schedule your appointment should you arrive 10 or more minutes late.  We strive to give you quality time with our providers, and arriving late affects you and other patients whose appointments are after yours.  Also, if you no show three or more times for appointments you may be dismissed from the clinic at the providers discretion.     Again, thank you for choosing [facility].  Our hope is that these requests will decrease the amount of time that you wait before being seen by our physicians.       _____________________________________________________________  Should you have questions after your visit to [facility], please contact our office at [phone] and follow the prompts.  Our office hours are 8:00 a.m. and 4:30 p.m. Monday - Friday.  Please note that voicemails left after 4:00 p.m. may not be returned until the following business day.  We are closed weekends and major holidays.  You do have access to a nurse 24-7, just call the main number to the clinic [phone] and do not press any options, hold on the line and a nurse will answer the phone.    For prescription refill requests, have your pharmacy contact our office and allow 72 hours.    Due to Covid, you will need to wear a mask upon entering the hospital. If you do not have a mask, a mask will be given to you at the Main Entrance upon arrival. For doctor visits, patients may have 1 support person age 61 or older with them. For treatment visits, patients can not have anyone with them due to social distancing guidelines and our immunocompromised population.

## 2021-12-23 ENCOUNTER — Encounter (HOSPITAL_COMMUNITY): Payer: Self-pay | Admitting: Internal Medicine

## 2021-12-23 ENCOUNTER — Other Ambulatory Visit (HOSPITAL_COMMUNITY): Payer: Self-pay

## 2021-12-23 ENCOUNTER — Telehealth (HOSPITAL_COMMUNITY): Payer: Self-pay | Admitting: Pharmacist

## 2021-12-23 NOTE — Telephone Encounter (Signed)
Oral Oncology Patient Advocate Encounter  Prior Authorization for Madeline [name] has been approved.    PA# [id number] Effective dates: 12/23/21 through 12/23/22  Patients co-pay is $1861.66.  Oral Oncology Clinic will continue to follow.   [place] [facility] [place] Phone [phone] Fax [phone] 12/23/2021 9:20 AM

## 2021-12-23 NOTE — Telephone Encounter (Signed)
Oral Oncology Pharmacist Encounter  Received new prescription for Promacta (eltrombopag) for the treatment of chronic ITP, planned duration until disease progression or unacceptable drug toxicity.  CBC/CMP from 12/22/21 assessed, no relevant lab abnormalities. Prescription dose and frequency assessed.   Current medication list in Epic reviewed, a few DDIs with eltrombopag identified: Atorvastatin: eltrombopag may increase the serum concentration atorvastatin.  Monitor for adverse events related to atorvastatin (muscle pain, changes in urine color). No baseline dose adjustment needed Multivitamin: polyvalent cation containing products may decrease the serum concentration of eltrombopag. Administer eltrombopag at least 2 hours before or 4 hours after oral administration of any polyvalent cation containing product.  Evaluated chart and no patient barriers to medication adherence identified.   Prescription has been e-scribed to the [facility] for benefits analysis and approval.  Oral Oncology Clinic will continue to follow for insurance authorization, copayment issues, initial counseling and start date.  Patient agreed to treatment on 12/22/21 per MD documentation.  Remi Haggard, PharmD, BCPS, BCOP, CPP Hematology/Oncology Clinical Pharmacist Practitioner [place]/DB/AP Oral Chemotherapy Navigation Clinic [phone]  12/23/2021 1:42 PM

## 2021-12-23 NOTE — Telephone Encounter (Signed)
Oral Chemotherapy Pharmacist Encounter  Due to high copay, Madeline [name] will be started on Promacta using a voucher while her manufacturer application is in process. [facility] will deliver medication to her by 12/25/21.  Patient Education I spoke with patient for overview of new oral chemotherapy medication: Promacta (eltrombopag) for the treatment of chronic ITP, planned duration until disease progression or unacceptable drug toxicity.   Pt is doing well. Counseled patient on administration, dosing, side effects, monitoring, drug-food interactions, safe handling, storage, and disposal. Patient will take 1 tablet (25 mg total) by mouth daily. Take on an empty stomach, 1 hour before a meal or 2 hours after.  Side effects include but not limited to: N/V, diarrhea.    Reviewed with patient importance of keeping a medication schedule and plan for any missed doses.  After discussion with patient no patient barriers to medication adherence identified.   Madeline [name] voiced understanding and appreciation. All questions answered. Medication handout provided.  Provided patient with Oral Chemotherapy Navigation Clinic phone number. Patient knows to call the office with questions or concerns. Oral Chemotherapy Navigation Clinic will continue to follow.  Remi Haggard, PharmD, BCPS, BCOP, CPP Hematology/Oncology Clinical Pharmacist Practitioner /DB/AP Oral Chemotherapy Navigation Clinic [phone]  12/23/2021 2:13 PM

## 2021-12-23 NOTE — Telephone Encounter (Signed)
Oral Chemotherapy Pharmacist Encounter   Also spoke with patient's daughter Elita Quick and reviewed the same Promacta education.  Remi Haggard, PharmD, BCPS, BCOP, CPP Hematology/Oncology Clinical Pharmacist [place]/DB/AP Oral Chemotherapy Navigation Clinic [phone]  12/23/2021 3:57 PM

## 2021-12-24 DIAGNOSIS — E039 Hypothyroidism, unspecified: Secondary | ICD-10-CM | POA: Diagnosis not present

## 2021-12-24 DIAGNOSIS — R001 Bradycardia, unspecified: Secondary | ICD-10-CM | POA: Diagnosis not present

## 2021-12-24 DIAGNOSIS — Z950 Presence of cardiac pacemaker: Secondary | ICD-10-CM | POA: Diagnosis not present

## 2021-12-24 DIAGNOSIS — R778 Other specified abnormalities of plasma proteins: Secondary | ICD-10-CM | POA: Diagnosis not present

## 2021-12-24 DIAGNOSIS — I442 Atrioventricular block, complete: Secondary | ICD-10-CM | POA: Diagnosis not present

## 2021-12-24 DIAGNOSIS — I959 Hypotension, unspecified: Secondary | ICD-10-CM | POA: Diagnosis not present

## 2021-12-24 DIAGNOSIS — K219 Gastro-esophageal reflux disease without esophagitis: Secondary | ICD-10-CM | POA: Diagnosis not present

## 2021-12-24 DIAGNOSIS — E785 Hyperlipidemia, unspecified: Secondary | ICD-10-CM | POA: Diagnosis not present

## 2021-12-24 DIAGNOSIS — I2510 Atherosclerotic heart disease of native coronary artery without angina pectoris: Secondary | ICD-10-CM | POA: Diagnosis not present

## 2021-12-24 DIAGNOSIS — D693 Immune thrombocytopenic purpura: Secondary | ICD-10-CM | POA: Diagnosis not present

## 2021-12-24 DIAGNOSIS — E1010 Type 1 diabetes mellitus with ketoacidosis without coma: Secondary | ICD-10-CM | POA: Diagnosis not present

## 2021-12-24 DIAGNOSIS — N179 Acute kidney failure, unspecified: Secondary | ICD-10-CM | POA: Diagnosis not present

## 2021-12-24 DIAGNOSIS — Z9641 Presence of insulin pump (external) (internal): Secondary | ICD-10-CM | POA: Diagnosis not present

## 2021-12-24 DIAGNOSIS — F329 Major depressive disorder, single episode, unspecified: Secondary | ICD-10-CM | POA: Diagnosis not present

## 2021-12-24 DIAGNOSIS — G9341 Metabolic encephalopathy: Secondary | ICD-10-CM | POA: Diagnosis not present

## 2021-12-24 DIAGNOSIS — I10 Essential (primary) hypertension: Secondary | ICD-10-CM | POA: Diagnosis not present

## 2021-12-24 DIAGNOSIS — I252 Old myocardial infarction: Secondary | ICD-10-CM | POA: Diagnosis not present

## 2021-12-24 DIAGNOSIS — E875 Hyperkalemia: Secondary | ICD-10-CM | POA: Diagnosis not present

## 2021-12-25 ENCOUNTER — Inpatient Hospital Stay (HOSPITAL_COMMUNITY): Payer: HMO

## 2021-12-25 ENCOUNTER — Ambulatory Visit (HOSPITAL_COMMUNITY): Payer: HMO | Attending: Cardiology

## 2021-12-25 ENCOUNTER — Other Ambulatory Visit: Payer: Self-pay

## 2021-12-25 DIAGNOSIS — I350 Nonrheumatic aortic (valve) stenosis: Secondary | ICD-10-CM

## 2021-12-25 DIAGNOSIS — I442 Atrioventricular block, complete: Secondary | ICD-10-CM | POA: Diagnosis not present

## 2021-12-25 DIAGNOSIS — D693 Immune thrombocytopenic purpura: Secondary | ICD-10-CM

## 2021-12-25 DIAGNOSIS — I2510 Atherosclerotic heart disease of native coronary artery without angina pectoris: Secondary | ICD-10-CM | POA: Diagnosis not present

## 2021-12-25 LAB — COMPREHENSIVE METABOLIC PANEL
ALT: 33 U/L (ref 0–44)
AST: 46 U/L — ABNORMAL HIGH (ref 15–41)
Albumin: 2.9 g/dL — ABNORMAL LOW (ref 3.5–5.0)
Alkaline Phosphatase: 62 U/L (ref 38–126)
Anion gap: 7 (ref 5–15)
BUN: 9 mg/dL (ref 8–23)
CO2: 28 mmol/L (ref 22–32)
Calcium: 7.9 mg/dL — ABNORMAL LOW (ref 8.9–10.3)
Chloride: 102 mmol/L (ref 98–111)
Creatinine, Ser: 0.68 mg/dL (ref 0.44–1.00)
GFR, Estimated: >60 mL/min (ref >60)
Glucose, Bld: 93 mg/dL (ref 70–99)
Potassium: 3.6 mmol/L (ref 3.5–5.1)
Sodium: 137 mmol/L (ref 135–145)
Total Bilirubin: 0.5 mg/dL (ref 0.3–1.2)
Total Protein: 6.3 g/dL — ABNORMAL LOW (ref 6.5–8.1)

## 2021-12-25 LAB — LACTATE DEHYDROGENASE: LDH: 222 U/L — ABNORMAL HIGH (ref 98–192)

## 2021-12-25 LAB — CBC
HCT: 34.3 % — ABNORMAL LOW (ref 36.0–46.0)
Hemoglobin: 10.8 g/dL — ABNORMAL LOW (ref 12.0–15.0)
MCH: 29.6 pg (ref 26.0–34.0)
MCHC: 31.5 g/dL (ref 30.0–36.0)
MCV: 94 fL (ref 80.0–100.0)
Platelets: 16 10*3/uL — CL (ref 150–400)
RBC: 3.65 MIL/uL — ABNORMAL LOW (ref 3.87–5.11)
RDW: 13.1 % (ref 11.5–15.5)
WBC: 6.4 10*3/uL (ref 4.0–10.5)
nRBC: 0 % (ref 0.0–0.2)

## 2021-12-25 LAB — ECHOCARDIOGRAM COMPLETE
AR max vel: 1.59 cm2
AV Area VTI: 1.49 cm2
AV Area mean vel: 1.36 cm2
AV Mean grad: 8 mmHg
AV Peak grad: 12 mmHg
Ao pk vel: 1.73 m/s
Area-P 1/2: 3.1 cm2
Height: 62 in
S' Lateral: 2.7 cm
Weight: 1849.6 oz

## 2021-12-25 NOTE — Progress Notes (Signed)
Patient presents today for possible Nplate injection.  Patient is in satisfactory condition with no complaints voiced.  Vital signs are stable.    Platelets today are 16.  MD made aware.  We will hold Nplate injection today and have patient RTC on Monday for repeat labs per Dr. Ellin Saba.  Patient informed.  Patient left ambulatory with son in stable condition.

## 2021-12-25 NOTE — Progress Notes (Signed)
CRITICAL VALUE ALERT Critical value received:  platelets 16 Date of notification:  12/25/2021 Time of notification: 09:00 am  Critical value read back:  Yes.   Nurse who received alert:  Bpresnell RN MD notified time and response:  Dr. Ellin Saba.   Nplate injection.

## 2021-12-25 NOTE — Patient Instructions (Signed)
[facility]  Discharge Instructions: Thank you for choosing [facility] to provide your oncology and hematology care.  If you have a lab appointment with the Cancer Center, please come in thru the Main Entrance and check in at the main information desk.  Wear comfortable clothing and clothing appropriate for easy access to any Portacath or PICC line.   We strive to give you quality time with your provider. You may need to reschedule your appointment if you arrive late (15 or more minutes).  Arriving late affects you and other patients whose appointments are after yours.  Also, if you miss three or more appointments without notifying the office, you may be dismissed from the clinic at the provider's discretion.      For prescription refill requests, have your pharmacy contact our office and allow 72 hours for refills to be completed.        To help prevent nausea and vomiting after your treatment, we encourage you to take your nausea medication as directed.  BELOW ARE SYMPTOMS THAT SHOULD BE REPORTED IMMEDIATELY: *FEVER GREATER THAN 100.4 F (38 C) OR HIGHER *CHILLS OR SWEATING *NAUSEA AND VOMITING THAT IS NOT CONTROLLED WITH YOUR NAUSEA MEDICATION *UNUSUAL SHORTNESS OF BREATH *UNUSUAL BRUISING OR BLEEDING *URINARY PROBLEMS (pain or burning when urinating, or frequent urination) *BOWEL PROBLEMS (unusual diarrhea, constipation, pain near the anus) TENDERNESS IN MOUTH AND THROAT WITH OR WITHOUT PRESENCE OF ULCERS (sore throat, sores in mouth, or a toothache) UNUSUAL RASH, SWELLING OR PAIN  UNUSUAL VAGINAL DISCHARGE OR ITCHING   Items with * indicate a potential emergency and should be followed up as soon as possible or go to the Emergency Department if any problems should occur.  Please show the CHEMOTHERAPY ALERT CARD or IMMUNOTHERAPY ALERT CARD at check-in to the Emergency Department and triage nurse.  Should you have questions after your visit or need to cancel  or reschedule your appointment, please contact [facility] [phone]  and follow the prompts.  Office hours are 8:00 a.m. to 4:30 p.m. Monday - Friday. Please note that voicemails left after 4:00 p.m. may not be returned until the following business day.  We are closed weekends and major holidays. You have access to a nurse at all times for urgent questions. Please call the main number to the clinic [phone] and follow the prompts.  For any non-urgent questions, you may also contact your provider using MyChart. We now offer e-Visits for anyone 18 and older to request care online for non-urgent symptoms. For details visit [email or web address].   Also download the MyChart app! Go to the app store, search "MyChart", open the app, select [place], and log in with your MyChart username and password.  Due to Covid, a mask is required upon entering the hospital/clinic. If you do not have a mask, one will be given to you upon arrival. For doctor visits, patients may have 1 support person aged 18 or older with them. For treatment visits, patients cannot have anyone with them due to current Covid guidelines and our immunocompromised population.  

## 2021-12-26 DIAGNOSIS — Z794 Long term (current) use of insulin: Secondary | ICD-10-CM | POA: Diagnosis not present

## 2021-12-26 DIAGNOSIS — Z9289 Personal history of other medical treatment: Secondary | ICD-10-CM | POA: Diagnosis not present

## 2021-12-26 DIAGNOSIS — Z9641 Presence of insulin pump (external) (internal): Secondary | ICD-10-CM | POA: Diagnosis not present

## 2021-12-26 DIAGNOSIS — E1043 Type 1 diabetes mellitus with diabetic autonomic (poly)neuropathy: Secondary | ICD-10-CM | POA: Diagnosis not present

## 2021-12-26 DIAGNOSIS — E10649 Type 1 diabetes mellitus with hypoglycemia without coma: Secondary | ICD-10-CM | POA: Diagnosis not present

## 2021-12-26 DIAGNOSIS — E1065 Type 1 diabetes mellitus with hyperglycemia: Secondary | ICD-10-CM | POA: Diagnosis not present

## 2021-12-26 DIAGNOSIS — F17210 Nicotine dependence, cigarettes, uncomplicated: Secondary | ICD-10-CM | POA: Diagnosis not present

## 2021-12-29 ENCOUNTER — Inpatient Hospital Stay (HOSPITAL_COMMUNITY): Payer: HMO | Admitting: Hematology

## 2021-12-29 ENCOUNTER — Telehealth (HOSPITAL_COMMUNITY): Payer: Self-pay | Admitting: Pharmacy Technician

## 2021-12-29 ENCOUNTER — Ambulatory Visit
Admission: RE | Admit: 2021-12-29 | Discharge: 2021-12-29 | Disposition: A | Discharge status: Home / Self Care | Payer: HMO | Source: Ambulatory Visit | Attending: Internal Medicine | Admitting: Internal Medicine

## 2021-12-29 ENCOUNTER — Inpatient Hospital Stay (HOSPITAL_COMMUNITY): Payer: HMO

## 2021-12-29 ENCOUNTER — Other Ambulatory Visit: Payer: Self-pay

## 2021-12-29 DIAGNOSIS — D693 Immune thrombocytopenic purpura: Secondary | ICD-10-CM

## 2021-12-29 DIAGNOSIS — Z1231 Encounter for screening mammogram for malignant neoplasm of breast: Secondary | ICD-10-CM | POA: Diagnosis not present

## 2021-12-29 LAB — CBC WITH DIFFERENTIAL/PLATELET
Abs Immature Granulocytes: 0.05 10*3/uL (ref 0.00–0.07)
Basophils Absolute: 0.1 10*3/uL (ref 0.0–0.1)
Basophils Relative: 1 %
Eosinophils Absolute: 0.1 10*3/uL (ref 0.0–0.5)
Eosinophils Relative: 1 %
HCT: 37.8 % (ref 36.0–46.0)
Hemoglobin: 11.6 g/dL — ABNORMAL LOW (ref 12.0–15.0)
Immature Granulocytes: 1 %
Lymphocytes Relative: 24 %
Lymphs Abs: 1.9 10*3/uL (ref 0.7–4.0)
MCH: 29.7 pg (ref 26.0–34.0)
MCHC: 30.7 g/dL (ref 30.0–36.0)
MCV: 96.9 fL (ref 80.0–100.0)
Monocytes Absolute: 0.9 10*3/uL (ref 0.1–1.0)
Monocytes Relative: 11 %
Neutro Abs: 5 10*3/uL (ref 1.7–7.7)
Neutrophils Relative %: 62 %
Platelets: 274 10*3/uL (ref 150–400)
RBC: 3.9 MIL/uL (ref 3.87–5.11)
RDW: 14.1 % (ref 11.5–15.5)
WBC: 8 10*3/uL (ref 4.0–10.5)
nRBC: 0 % (ref 0.0–0.2)

## 2021-12-29 LAB — COMPREHENSIVE METABOLIC PANEL
ALT: 34 U/L (ref 0–44)
AST: 43 U/L — ABNORMAL HIGH (ref 15–41)
Albumin: 3.4 g/dL — ABNORMAL LOW (ref 3.5–5.0)
Alkaline Phosphatase: 76 U/L (ref 38–126)
Anion gap: 10 (ref 5–15)
BUN: 10 mg/dL (ref 8–23)
CO2: 27 mmol/L (ref 22–32)
Calcium: 8.5 mg/dL — ABNORMAL LOW (ref 8.9–10.3)
Chloride: 101 mmol/L (ref 98–111)
Creatinine, Ser: 0.67 mg/dL (ref 0.44–1.00)
GFR, Estimated: >60 mL/min (ref >60)
Glucose, Bld: 125 mg/dL — ABNORMAL HIGH (ref 70–99)
Potassium: 4.4 mmol/L (ref 3.5–5.1)
Sodium: 138 mmol/L (ref 135–145)
Total Bilirubin: 0.6 mg/dL (ref 0.3–1.2)
Total Protein: 7.1 g/dL (ref 6.5–8.1)

## 2021-12-29 LAB — LACTATE DEHYDROGENASE: LDH: 292 U/L — ABNORMAL HIGH (ref 98–192)

## 2021-12-29 NOTE — Telephone Encounter (Signed)
Oral Oncology Patient Advocate Encounter  With the patients permission, I submitted an online application to Patient Assistance [employer] Oncology [facility]).  [facility] will complete a benefits investigation and review the application submitted to determine if the patient qualifies for [employer] Patient [place].  Confirmation [id number]  I faxed the completed Health Care Provider form to [phone].  [facility] phone number is [phone].  [place] [facility] [place] Phone [phone] Fax [phone] 12/30/2021 3:29 PM

## 2021-12-29 NOTE — Progress Notes (Signed)
Patient is taking Promacta as prescribed.  She has not missed any doses and reports no side effects at this time.

## 2021-12-29 NOTE — Patient Instructions (Addendum)
[facility] at [facility] Discharge Instructions   You were seen and examined today by Dr. Ellin Saba.  He reviewed the results of your lab work. Your platelet count has improved to 274 today.  Continue Promacta 25 mg daily as prescribed.   Return to as scheduled for lab work.  Return as scheduled for lab work and office visit.    Thank you for choosing [facility] at [facility] to provide your oncology and hematology care.  To afford each patient quality time with our provider, please arrive at least 15 minutes before your scheduled appointment time.   If you have a lab appointment with the Cancer Center please come in thru the Main Entrance and check in at the main information desk.  You need to re-schedule your appointment should you arrive 10 or more minutes late.  We strive to give you quality time with our providers, and arriving late affects you and other patients whose appointments are after yours.  Also, if you no show three or more times for appointments you may be dismissed from the clinic at the providers discretion.     Again, thank you for choosing [facility].  Our hope is that these requests will decrease the amount of time that you wait before being seen by our physicians.       _____________________________________________________________  Should you have questions after your visit to [facility], please contact our office at [phone] and follow the prompts.  Our office hours are 8:00 a.m. and 4:30 p.m. Monday - Friday.  Please note that voicemails left after 4:00 p.m. may not be returned until the following business day.  We are closed weekends and major holidays.  You do have access to a nurse 24-7, just call the main number to the clinic [phone] and do not press any options, hold on the line and a nurse will answer the phone.    For prescription refill requests, have your pharmacy contact  our office and allow 72 hours.    Due to Covid, you will need to wear a mask upon entering the hospital. If you do not have a mask, a mask will be given to you at the Main Entrance upon arrival. For doctor visits, patients may have 1 support person age 44 or older with them. For treatment visits, patients can not have anyone with them due to social distancing guidelines and our immunocompromised population.

## 2021-12-29 NOTE — Progress Notes (Signed)
Madeline [name], [place] [address]   CLINIC:  Medical Oncology/Hematology  PCP:  Idelle Crouch, MD [place] / [place] [phone]  REASON FOR VISIT:  Follow-up for chronic ITP with exacerbations  PRIOR THERAPY: none  CURRENT THERAPY: surveillance  INTERVAL HISTORY:  Madeline [name], a 88 y.o. female, returns for routine follow-up for her chronic ITP with exacerbations. Madeline [name] was last seen on 12/22/2021.  Today she reports feeling good. She denies n/v/d and bleeding.   REVIEW OF SYSTEMS:  Review of Systems  Constitutional:  Negative for appetite change and fatigue.  HENT:   Negative for nosebleeds.   Respiratory:  Negative for hemoptysis.   Gastrointestinal:  Negative for blood in stool, diarrhea, nausea and vomiting.  Genitourinary:  Negative for hematuria and vaginal bleeding.   Hematological:  Does not bruise/bleed easily.  All other systems reviewed and are negative.  PAST MEDICAL/SURGICAL HISTORY:  Past Medical History:  Diagnosis Date   Bradycardia    Coronary artery disease    Diabetes mellitus    Dyslipidemia    GERD (gastroesophageal reflux disease)    Hypothyroid    Presence of permanent cardiac pacemaker 12/16/2018   Medtronic dual lead pacemaker   Past Surgical History:  Procedure Laterality Date   APPENDECTOMY     BREAST BIOPSY Right 1967   EXCISIONAL - NEG   CHOLECYSTECTOMY     CORONARY STENT INTERVENTION     INSERT / REPLACE / REMOVE PACEMAKER  12/16/2018   no surgical hx     PACEMAKER IMPLANT N/A 12/16/2018   Procedure: PACEMAKER IMPLANT;  Surgeon: Evans Lance, MD;  Location: [facility];  Service: Cardiovascular;  Laterality: N/A;    SOCIAL HISTORY:  Social History   Socioeconomic History   Marital status: Married    Spouse name: Not on file   Number of children: Not on file   Years of education: Not on file   Highest education level: Not  on file  Occupational History   Not on file  Tobacco Use   Smoking status: Every Day    Packs/day: 1.00    Years: 50.00    Pack years: 50.00    Types: Cigarettes   Smokeless tobacco: Never  Vaping Use   Vaping Use: Never used  Substance and Sexual Activity   Alcohol use: No   Drug use: No   Sexual activity: Not on file  Other Topics Concern   Not on file  Social History Narrative   Not on file   Social Determinants of Health   Financial Resource Strain: Not on file  Food Insecurity: Not on file  Transportation Needs: Not on file  Physical Activity: Not on file  Stress: Not on file  Social Connections: Not on file  Intimate Partner Violence: Not on file    FAMILY HISTORY:  Family History  Problem Relation Age of Onset   Heart Problems Mother    Other Father        UNK   Cancer Sister        BREAST   Breast cancer Sister 77   Cancer Sister        BREAST   Breast cancer Sister 61   Cancer Sister        BREAST   Diabetes Son     CURRENT MEDICATIONS:  Current Outpatient Medications  Medication Sig Dispense Refill   acetaminophen (TYLENOL) 325  MG tablet Take 1-2 tablets (325-650 mg total) by mouth every 6 (six) hours as needed for mild pain or moderate pain.     atorvastatin (LIPITOR) 20 MG tablet Take 1 tablet (20 mg total) by mouth daily at 6 PM. 90 tablet 3   cetirizine (ZYRTEC) 10 MG tablet Take 10 mg by mouth daily.     citalopram (CELEXA) 20 MG tablet Take 20 mg by mouth daily.     CONTOUR NEXT TEST test strip USE 1 STRIP TO CHECK BLOOD SUGAR EIGHT TO NINE TIMES DAILY.     eltrombopag (PROMACTA) 25 MG tablet Take 1 tablet (25 mg total) by mouth daily. Take on an empty stomach, 1 hour before a meal or 2 hours after. 30 tablet 0   Insulin Human (INSULIN PUMP) SOLN Inject 1 each into the skin continuous. Used with Novolog insulin     Lancets 28G MISC Use as instructed. 7-8 times daily.     levothyroxine (SYNTHROID, LEVOTHROID) 50 MCG tablet Take 50 mcg by  mouth daily before breakfast.   3   Multiple Vitamin (MULTIVITAMIN WITH MINERALS) TABS tablet Take 1 tablet by mouth daily.     NOVOLOG 100 UNIT/ML injection Inject 0-50 Units into the skin continuous. Up to 50 units/ day via pump 10 mL 0   lisinopril (PRINIVIL,ZESTRIL) 20 MG tablet Take 1 tablet (20 mg total) by mouth daily. 30 tablet 1   No current facility-administered medications for this visit.    ALLERGIES:  Allergies  Allergen Reactions   Darvon     "FEEL SICK"   Propoxyphene Nausea Only and Nausea And Vomiting   Sulfa Antibiotics Nausea Only and Nausea And Vomiting    "FEEL SICK"     PHYSICAL EXAM:  Performance status (ECOG): 1 - Symptomatic but completely ambulatory  There were no vitals filed for this visit. Wt Readings from Last 3 Encounters:  12/25/21 115 lb 9.6 oz (52.4 kg)  12/22/21 117 lb 4.6 oz (53.2 kg)  12/16/21 126 lb 15.8 oz (57.6 kg)   Physical Exam Vitals reviewed.  Constitutional:      Appearance: Normal appearance.  Cardiovascular:     Rate and Rhythm: Normal rate and regular rhythm.     Pulses: Normal pulses.     Heart sounds: Normal heart sounds.  Pulmonary:     Effort: Pulmonary effort is normal.     Breath sounds: Normal breath sounds.  Neurological:     General: No focal deficit present.     Mental Status: She is alert and oriented to person, place, and time.  Psychiatric:        Mood and Affect: Mood normal.        Behavior: Behavior normal.    LABORATORY DATA:  I have reviewed the labs as listed.  CBC Latest Ref Rng & Units 12/29/2021 12/25/2021 12/22/2021  WBC 4.0 - 10.5 K/uL 8.0 6.4 6.0  Hemoglobin 12.0 - 15.0 g/dL 11.6(L) 10.8(L) 11.6(L)  Hematocrit 36.0 - 46.0 % 37.8 34.3(L) 36.3  Platelets 150 - 400 K/uL 274 16(LL) 5(LL)   CMP Latest Ref Rng & Units 12/29/2021 12/25/2021 12/22/2021  Glucose 70 - 99 mg/dL 125(H) 93 94  BUN 8 - 23 mg/dL 10 9 11   Creatinine 0.44 - 1.00 mg/dL 0.67 0.68 0.60  Sodium 135 - 145 mmol/L 138 137 139   Potassium 3.5 - 5.1 mmol/L 4.4 3.6 3.3(L)  Chloride 98 - 111 mmol/L 101 102 103  CO2 22 - 32 mmol/L 27 28 26  Calcium 8.9 - 10.3 mg/dL 8.5(L) 7.9(L) 8.2(L)  Total Protein 6.5 - 8.1 g/dL 7.1 6.3(L) 6.9  Total Bilirubin 0.3 - 1.2 mg/dL 0.6 0.5 0.3  Alkaline Phos 38 - 126 U/L 76 62 73  AST 15 - 41 U/L 43(H) 46(H) 34  ALT 0 - 44 U/L 34 33 25      Component Value Date/Time   RBC 3.90 12/29/2021 0954   MCV 96.9 12/29/2021 0954   MCV 92 12/09/2018 1308   MCH 29.7 12/29/2021 0954   MCHC 30.7 12/29/2021 0954   RDW 14.1 12/29/2021 0954   RDW 12.2 12/09/2018 1308   LYMPHSABS 1.9 12/29/2021 0954   LYMPHSABS 2.0 12/09/2018 1308   MONOABS 0.9 12/29/2021 0954   EOSABS 0.1 12/29/2021 0954   EOSABS 0.2 12/09/2018 1308   BASOSABS 0.1 12/29/2021 0954   BASOSABS 0.0 12/09/2018 1308    DIAGNOSTIC IMAGING:  I have independently reviewed the scans and discussed with the patient. CT HEAD WO CONTRAST (5MM)  Result Date: 12/15/2021 CLINICAL DATA:  Sudden severe headache EXAM: CT HEAD WITHOUT CONTRAST TECHNIQUE: Contiguous axial images were obtained from the base of the skull through the vertex without intravenous contrast. RADIATION DOSE REDUCTION: This exam was performed according to the departmental dose-optimization program which includes automated exposure control, adjustment of the mA and/or kV according to patient size and/or use of iterative reconstruction technique. COMPARISON:  CT head dated September 06, 2012 FINDINGS: Brain: No evidence of acute infarction, hemorrhage, hydrocephalus, extra-axial collection or mass lesion/mass effect. Low attenuation of periventricular and subcortical white matter presumed chronic microvascular ischemic changes. Minimal scattered bilateral dystrophic basal ganglia calcifications. Vascular: No hyperdense vessel or unexpected calcification. Skull: Normal. Negative for fracture or focal lesion. Sinuses/Orbits: No acute finding. Other: None IMPRESSION: 1.  No acute  intracranial abnormality. 2.  Mild chronic microvascular ischemic changes of the white matter. Electronically Signed   By: Keane Police D.O.   On: 12/15/2021 14:47   Portable chest x-ray (1 view)  Result Date: 12/15/2021 CLINICAL DATA:  DKA EXAM: PORTABLE CHEST 1 VIEW COMPARISON:  Chest x-ray 12/17/2018, CT chest 08/08/2018 FINDINGS: Left chest wall dual lead pacemaker in similar position. The heart and mediastinal contours are unchanged. Aortic calcification. Severe mitral annular calcifications. Biapical pleural/pulmonary scarring. No focal consolidation. Chronic coarsened interstitial markings. No pleural effusion. No pneumothorax. No acute osseous abnormality. IMPRESSION: 1. No active disease. 2. Aortic Atherosclerosis (ICD10-I70.0) including mitral annular calcification. Electronically Signed   By: Iven Finn M.D.   On: 12/15/2021 17:21   MM 3D SCREEN BREAST BILATERAL  Result Date: 12/29/2021 CLINICAL DATA:  Screening. EXAM: DIGITAL SCREENING BILATERAL MAMMOGRAM WITH TOMOSYNTHESIS AND CAD TECHNIQUE: Bilateral screening digital craniocaudal and mediolateral oblique mammograms were obtained. Bilateral screening digital breast tomosynthesis was performed. The images were evaluated with computer-aided detection. COMPARISON:  Previous exam(s). ACR Breast Density Category b: There are scattered areas of fibroglandular density. FINDINGS: There are no findings suspicious for malignancy. IMPRESSION: No mammographic evidence of malignancy. A result letter of this screening mammogram will be mailed directly to the patient. RECOMMENDATION: Screening mammogram in one year. (Code:SM-B-01Y) BI-RADS CATEGORY  1: Negative. Electronically Signed   By: Ammie Ferrier M.D.   On: 12/29/2021 11:32   ECHOCARDIOGRAM COMPLETE  Result Date: 12/25/2021    ECHOCARDIOGRAM REPORT   Patient Name:   Madeline [name] Date of Exam: 12/25/2021 Medical Rec #:  [id number]        Height:       62.0 in Accession #:  [id number]        Weight:       115.6 lb Date of Birth:  May 14, 1934         BSA:          1.514 m Patient Age:    89 years         BP:           157/74 mmHg Patient Gender: F                HR:           75 bpm. Exam Location:  [facility] Procedure: 2D Echo, Cardiac Doppler and Color Doppler Indications:    I35.0 Aortic Stenosis  History:        Patient has prior history of Echocardiogram examinations, most                 recent 12/18/2021. CAD, Pacemaker, Arrythmias:Bradycardia; Risk                 Factors:Diabetes and Dyslipidemia. Hypothyroidism.  Sonographer:    Cresenciano Lick RDCS Referring Phys: [id number] PAULA V ROSS IMPRESSIONS  1. Left ventricular ejection fraction, by estimation, is 60 to 65%. The left ventricle has normal function. The left ventricle has no regional wall motion abnormalities. There is mild concentric left ventricular hypertrophy. Left ventricular diastolic parameters are consistent with Grade I diastolic dysfunction (impaired relaxation). Elevated left ventricular end-diastolic pressure.  2. Right ventricular systolic function is normal. The right ventricular size is normal. Tricuspid regurgitation signal is inadequate for assessing PA pressure.  3. The pericardial effusion is anterior to the right ventricle.  4. The mitral valve is normal in structure. No evidence of mitral valve regurgitation. No evidence of mitral stenosis. Severe mitral annular calcification.  5. The aortic valve is calcified. There is moderate calcification of the aortic valve. There is moderate thickening of the aortic valve. Aortic valve regurgitation is not visualized. Mild aortic valve stenosis. Aortic valve area, by VTI measures 1.49 cm. Aortic valve mean gradient measures 8.0 mmHg. Aortic valve Vmax measures 1.73 m/s. Di 0.47  6. The inferior vena cava is normal in size with greater than 50% respiratory variability, suggesting right atrial pressure of 3 mmHg. FINDINGS  Left Ventricle: Left ventricular ejection fraction,  by estimation, is 60 to 65%. The left ventricle has normal function. The left ventricle has no regional wall motion abnormalities. The left ventricular internal cavity size was normal in size. There is  mild concentric left ventricular hypertrophy. Left ventricular diastolic parameters are consistent with Grade I diastolic dysfunction (impaired relaxation). Elevated left ventricular end-diastolic pressure. Right Ventricle: The right ventricular size is normal. No increase in right ventricular wall thickness. Right ventricular systolic function is normal. Tricuspid regurgitation signal is inadequate for assessing PA pressure. Left Atrium: Left atrial size was normal in size. Right Atrium: Right atrial size was normal in size. Pericardium: Trivial pericardial effusion is present. The pericardial effusion is anterior to the right ventricle. Mitral Valve: The mitral valve is normal in structure. Severe mitral annular calcification. No evidence of mitral valve regurgitation. No evidence of mitral valve stenosis. Tricuspid Valve: The tricuspid valve is normal in structure. Tricuspid valve regurgitation is not demonstrated. No evidence of tricuspid stenosis. Aortic Valve: The aortic valve is calcified. There is moderate calcification of the aortic valve. There is moderate thickening of the aortic valve. Aortic valve regurgitation is not visualized. Mild aortic stenosis is present. Aortic valve mean gradient measures 8.0 mmHg. Aortic  valve peak gradient measures 12.0 mmHg. Aortic valve area, by VTI measures 1.49 cm. Pulmonic Valve: The pulmonic valve was normal in structure. Pulmonic valve regurgitation is trivial. No evidence of pulmonic stenosis. Aorta: The aortic root is normal in size and structure. Venous: The inferior vena cava is normal in size with greater than 50% respiratory variability, suggesting right atrial pressure of 3 mmHg. IAS/Shunts: No atrial level shunt detected by color flow Doppler. Additional  Comments: A device lead is visualized.  LEFT VENTRICLE PLAX 2D LVIDd:         3.90 cm   Diastology LVIDs:         2.70 cm   LV e' medial:    4.13 cm/s LV PW:         1.10 cm   LV E/e' medial:  30.8 LV IVS:        1.10 cm   LV e' lateral:   5.77 cm/s LVOT diam:     2.00 cm   LV E/e' lateral: 22.0 LV SV:         59 LV SV Index:   39 LVOT Area:     3.14 cm  RIGHT VENTRICLE             IVC RV Basal diam:  3.60 cm     IVC diam: 1.00 cm RV S prime:     13.10 cm/s TAPSE (M-mode): 2.2 cm LEFT ATRIUM             Index        RIGHT ATRIUM          Index LA diam:        3.80 cm 2.51 cm/m   RA Area:     9.95 cm LA Vol (A2C):   48.1 ml 31.76 ml/m  RA Volume:   22.00 ml 14.53 ml/m LA Vol (A4C):   38.0 ml 25.09 ml/m LA Biplane Vol: 45.0 ml 29.71 ml/m  AORTIC VALVE AV Area (Vmax):    1.59 cm AV Area (Vmean):   1.36 cm AV Area (VTI):     1.49 cm AV Vmax:           173.40 cm/s AV Vmean:          136.000 cm/s AV VTI:            0.395 m AV Peak Grad:      12.0 mmHg AV Mean Grad:      8.0 mmHg LVOT Vmax:         87.67 cm/s LVOT Vmean:        58.933 cm/s LVOT VTI:          0.187 m LVOT/AV VTI ratio: 0.47  AORTA Ao Root diam: 2.80 cm Ao Asc diam:  3.20 cm MITRAL VALVE MV Area (PHT): 3.10 cm     SHUNTS MV Decel Time: 245 msec     Systemic VTI:  0.19 m MV E velocity: 127.00 cm/s  Systemic Diam: 2.00 cm MV A velocity: 175.00 cm/s MV E/A ratio:  0.73 Fransico Him MD Electronically signed by Fransico Him MD Signature Date/Time: 12/25/2021/1:53:56 PM    Final    ECHOCARDIOGRAM COMPLETE  Result Date: 12/18/2021    ECHOCARDIOGRAM REPORT   Patient Name:   Madeline [name] Date of Exam: 12/18/2021 Medical Rec #:  [id number]        Height:       62.0 in Accession #:    [id number]  Weight:       127.0 lb Date of Birth:  04-30-1934         BSA:          1.576 m Patient Age:    68 years         BP:           157/68 mmHg Patient Gender: F                HR:           77 bpm. Exam Location:  [facility] Procedure: 2D Echo, Color Doppler and Cardiac  Doppler Indications:     Elevated troponin  History:         Patient has prior history of Echocardiogram examinations. CAD,                  Pacemaker; Risk Factors:Diabetes and Dyslipidemia.  Sonographer:     Charmayne Sheer Referring Phys:  [id number] Terrilee Croak Diagnosing Phys: Ida Rogue MD  Sonographer Comments: Suboptimal parasternal window and no subcostal window. IMPRESSIONS  1. Left ventricular ejection fraction, by estimation, is 60 to 65%. The left ventricle has normal function. The left ventricle has no regional wall motion abnormalities. There is mild left ventricular hypertrophy. Left ventricular diastolic parameters are consistent with Grade I diastolic dysfunction (impaired relaxation).  2. Right ventricular systolic function is normal. The right ventricular size is normal.  3. The mitral valve is normal in structure. No evidence of mitral valve regurgitation. Mild mitral stenosis. The mean mitral valve gradient is 6.0 mmHg. Moderate mitral annular calcification.  4. The aortic valve is normal in structure. Aortic valve regurgitation is not visualized. No aortic stenosis is present.  5. The inferior vena cava is normal in size with greater than 50% respiratory variability, suggesting right atrial pressure of 3 mmHg. FINDINGS  Left Ventricle: Left ventricular ejection fraction, by estimation, is 60 to 65%. The left ventricle has normal function. The left ventricle has no regional wall motion abnormalities. The left ventricular internal cavity size was normal in size. There is  mild left ventricular hypertrophy. Left ventricular diastolic parameters are consistent with Grade I diastolic dysfunction (impaired relaxation). Right Ventricle: The right ventricular size is normal. No increase in right ventricular wall thickness. Right ventricular systolic function is normal. Left Atrium: Left atrial size was normal in size. Right Atrium: Right atrial size was normal in size. Pericardium: There is no evidence  of pericardial effusion. Mitral Valve: The mitral valve is normal in structure. Moderate mitral annular calcification. No evidence of mitral valve regurgitation. Mild mitral valve stenosis. MV peak gradient, 11.7 mmHg. The mean mitral valve gradient is 6.0 mmHg. Tricuspid Valve: The tricuspid valve is normal in structure. Tricuspid valve regurgitation is not demonstrated. No evidence of tricuspid stenosis. Aortic Valve: The aortic valve is normal in structure. Aortic valve regurgitation is not visualized. No aortic stenosis is present. Aortic valve mean gradient measures 7.0 mmHg. Aortic valve peak gradient measures 13.1 mmHg. Aortic valve area, by VTI measures 1.73 cm. Pulmonic Valve: The pulmonic valve was normal in structure. Pulmonic valve regurgitation is not visualized. No evidence of pulmonic stenosis. Aorta: The aortic root is normal in size and structure. Venous: The inferior vena cava is normal in size with greater than 50% respiratory variability, suggesting right atrial pressure of 3 mmHg. IAS/Shunts: No atrial level shunt detected by color flow Doppler.  LEFT VENTRICLE PLAX 2D LVIDd:         4.26 cm  Diastology LVIDs:         3.64 cm     LV e' medial:    6.31 cm/s LV PW:         1.01 cm     LV E/e' medial:  20.8 LV IVS:        0.99 cm     LV e' lateral:   7.83 cm/s LVOT diam:     1.80 cm     LV E/e' lateral: 16.7 LV SV:         60 LV SV Index:   38 LVOT Area:     2.54 cm  LV Volumes (MOD) LV vol d, MOD A2C: 66.0 ml LV vol d, MOD A4C: 81.5 ml LV vol s, MOD A2C: 36.2 ml LV vol s, MOD A4C: 43.0 ml LV SV MOD A2C:     29.8 ml LV SV MOD A4C:     81.5 ml LV SV MOD BP:      33.5 ml RIGHT VENTRICLE RV Basal diam:  3.02 cm RV S prime:     9.57 cm/s LEFT ATRIUM           Index        RIGHT ATRIUM          Index LA diam:      3.80 cm 2.41 cm/m   RA Area:     8.55 cm LA Vol (A2C): 34.8 ml 22.08 ml/m  RA Volume:   17.40 ml 11.04 ml/m  AORTIC VALVE                     PULMONIC VALVE AV Area (Vmax):    1.63  cm      PV Vmax:       0.88 m/s AV Area (Vmean):   1.72 cm      PV Vmean:      63.900 cm/s AV Area (VTI):     1.73 cm      PV VTI:        0.189 m AV Vmax:           181.00 cm/s   PV Peak grad:  3.1 mmHg AV Vmean:          122.000 cm/s  PV Mean grad:  2.0 mmHg AV VTI:            0.348 m AV Peak Grad:      13.1 mmHg AV Mean Grad:      7.0 mmHg LVOT Vmax:         116.00 cm/s LVOT Vmean:        82.500 cm/s LVOT VTI:          0.237 m LVOT/AV VTI ratio: 0.68  AORTA Ao Root diam: 2.80 cm MITRAL VALVE MV Area (PHT): 6.54 cm     SHUNTS MV Area VTI:   1.57 cm     Systemic VTI:  0.24 m MV Peak grad:  11.7 mmHg    Systemic Diam: 1.80 cm MV Mean grad:  6.0 mmHg MV Vmax:       1.71 m/s MV Vmean:      113.0 cm/s MV Decel Time: 116 msec MV E velocity: 131.00 cm/s MV A velocity: 139.00 cm/s MV E/A ratio:  0.94 Ida Rogue MD Electronically signed by Ida Rogue MD Signature Date/Time: 12/18/2021/6:25:03 PM    Final    CUP PACEART REMOTE DEVICE CHECK  Result Date: 12/12/2021 Scheduled remote reviewed. Normal device function.  Next remote  91 days. LA    ASSESSMENT:  1.  Chronic ITP with exacerbations: Patient had thrombocytopenia since July 2016.  CT abdomen on 02/02/2018 showed normal-sized spleen.  Bone marrow aspiration and biopsy in 09/15/2017 showed hypercellular marrow for age, trilineage hematopoiesis with abundant megakaryocytes with no dysplasia. - Prednisone tapering dose over 5 weeks given on 12/14/2017 with no improvement in platelet count - IVIG (1 g/kg) on 12/23/2017 and 12/24/2017 with peak platelet count of 92, response lasting about 3 weeks -Dexamethasone 40 mg for 4 days on 01/11/2018 with improvement in platelet count of 61. - IVIG 1 g/kg on 12/18/2021 and 12/19/2021 without response. - Nplate 2 mcg/kg on 624THL. - Promacta 25 mg daily started on 12/26/2021.     2.  Right middle lobe lung nodule: -CT of the chest on 08/08/2018 showed right middle lobe lung nodule with one-year follow-up recommended.     PLAN:  1.  Chronic ITP with exacerbations: -She received Nplate last Monday.  Platelet count was 16 on 12/25/2021. - She also started taking Promacta on 12/26/2021. - She did not experience any side effects from Promacta yet. - We have reviewed labs today which showed AST elevated at 43 and rest of the LFTs are normal.  CBC was grossly normal with hemoglobin 11.6.  Platelet count improved to 274 from 16 last week.  Differential was normal. - We will continue Promacta 25 mg daily.  Platelet count response likely from combination of Nplate and Promacta.  Hence I would not make any changes in Promacta dose at this time. - We will check platelet count on 01/05/2022.  If they are remaining about 250 K, will consider decreasing Promacta to 25 mg every other day. - RTC 2 weeks for follow-up with labs.   2.  Right middle lobe lung nodule: -She did not have any repeat CT scan after 08/08/2018. - We will consider repeat CT scan without contrast in the near future.  3.  Health maintenance: - Mammogram on 12/29/2021 was BI-RADS Category 1.  Orders placed this encounter:  No orders of the defined types were placed in this encounter.    Derek Jack, MD [place] [phone]   I, Thana Ates, am acting as a scribe for Dr. Derek Jack.  I, Derek Jack MD, have reviewed the above documentation for accuracy and completeness, and I agree with the above.

## 2021-12-30 ENCOUNTER — Other Ambulatory Visit (HOSPITAL_COMMUNITY): Payer: HMO

## 2021-12-31 NOTE — Telephone Encounter (Addendum)
Received benefits summary from [facility].    YaYa from [facility] called regarding benefits summary that was faxed in. Made her aware that the patient could not afford the copay.  Patient is being referred to [employer].  Will update this encounter until final determination.  Daine Floras CPHT Specialty Pharmacy Patient Advocate [facility] Phone [phone] Fax [phone] 12/31/2021 11:53 AM

## 2021-12-31 NOTE — Telephone Encounter (Signed)
Oral Oncology Patient Advocate Encounter  Received notification from [employer] that patient has been successfully enrolled into their program to receive Promacta from the manufacturer at $0 out of pocket until 11/15/22.    I called and spoke with patient.  She knows we will have to re-apply.   Specialty Pharmacy that will dispense medication is [facility].  Patient knows to call the office with questions or concerns.   Oral Oncology Clinic will continue to follow.  Daine Floras CPHT Specialty Pharmacy Patient Advocate [facility] Phone [phone] Fax [phone] 12/31/2021 2:26 PM

## 2022-01-01 DIAGNOSIS — I10 Essential (primary) hypertension: Secondary | ICD-10-CM | POA: Diagnosis not present

## 2022-01-01 DIAGNOSIS — Z79899 Other long term (current) drug therapy: Secondary | ICD-10-CM | POA: Diagnosis not present

## 2022-01-01 DIAGNOSIS — E1043 Type 1 diabetes mellitus with diabetic autonomic (poly)neuropathy: Secondary | ICD-10-CM | POA: Diagnosis not present

## 2022-01-01 DIAGNOSIS — D696 Thrombocytopenia, unspecified: Secondary | ICD-10-CM | POA: Diagnosis not present

## 2022-01-01 DIAGNOSIS — Z0000 Encounter for general adult medical examination without abnormal findings: Secondary | ICD-10-CM | POA: Diagnosis not present

## 2022-01-01 DIAGNOSIS — E039 Hypothyroidism, unspecified: Secondary | ICD-10-CM | POA: Diagnosis not present

## 2022-01-01 DIAGNOSIS — E7800 Pure hypercholesterolemia, unspecified: Secondary | ICD-10-CM | POA: Diagnosis not present

## 2022-01-01 DIAGNOSIS — J431 Panlobular emphysema: Secondary | ICD-10-CM | POA: Diagnosis not present

## 2022-01-05 ENCOUNTER — Other Ambulatory Visit: Payer: Self-pay

## 2022-01-05 ENCOUNTER — Inpatient Hospital Stay (HOSPITAL_COMMUNITY): Payer: HMO

## 2022-01-05 DIAGNOSIS — D693 Immune thrombocytopenic purpura: Secondary | ICD-10-CM

## 2022-01-05 LAB — PLATELET COUNT: Platelets: 258 10*3/uL (ref 150–400)

## 2022-01-12 ENCOUNTER — Inpatient Hospital Stay (HOSPITAL_COMMUNITY): Payer: HMO | Admitting: Hematology

## 2022-01-12 ENCOUNTER — Inpatient Hospital Stay (HOSPITAL_COMMUNITY): Payer: HMO

## 2022-01-12 ENCOUNTER — Other Ambulatory Visit: Payer: Self-pay

## 2022-01-12 VITALS — BP 121/51 | HR 94 | Temp 96.9°F | Resp 17 | Ht 62.0 in | Wt 113.0 lb

## 2022-01-12 DIAGNOSIS — D693 Immune thrombocytopenic purpura: Secondary | ICD-10-CM

## 2022-01-12 LAB — CBC
HCT: 37.9 % (ref 36.0–46.0)
Hemoglobin: 11.9 g/dL — ABNORMAL LOW (ref 12.0–15.0)
MCH: 30.1 pg (ref 26.0–34.0)
MCHC: 31.4 g/dL (ref 30.0–36.0)
MCV: 95.9 fL (ref 80.0–100.0)
Platelets: 155 10*3/uL (ref 150–400)
RBC: 3.95 MIL/uL (ref 3.87–5.11)
RDW: 13.9 % (ref 11.5–15.5)
WBC: 5.3 10*3/uL (ref 4.0–10.5)
nRBC: 0 % (ref 0.0–0.2)

## 2022-01-12 LAB — COMPREHENSIVE METABOLIC PANEL
ALT: 19 U/L (ref 0–44)
AST: 29 U/L (ref 15–41)
Albumin: 3.4 g/dL — ABNORMAL LOW (ref 3.5–5.0)
Alkaline Phosphatase: 84 U/L (ref 38–126)
Anion gap: 4 — ABNORMAL LOW (ref 5–15)
BUN: 7 mg/dL — ABNORMAL LOW (ref 8–23)
CO2: 29 mmol/L (ref 22–32)
Calcium: 8.4 mg/dL — ABNORMAL LOW (ref 8.9–10.3)
Chloride: 105 mmol/L (ref 98–111)
Creatinine, Ser: 0.64 mg/dL (ref 0.44–1.00)
GFR, Estimated: >60 mL/min (ref >60)
Glucose, Bld: 106 mg/dL — ABNORMAL HIGH (ref 70–99)
Potassium: 3.5 mmol/L (ref 3.5–5.1)
Sodium: 138 mmol/L (ref 135–145)
Total Bilirubin: 0.3 mg/dL (ref 0.3–1.2)
Total Protein: 6.6 g/dL (ref 6.5–8.1)

## 2022-01-12 MED ORDER — ELTROMBOPAG OLAMINE 25 MG PO TABS: 25.0000 mg | ORAL_TABLET | Freq: Every day | ORAL | 2 refills | Status: DC

## 2022-01-12 NOTE — Progress Notes (Signed)
Patient is taking Promacta as prescribed.  She has not missed any doses and reports no side effects at this time.   °

## 2022-01-12 NOTE — Patient Instructions (Addendum)
[place] at [facility] Discharge Instructions   You were seen and examined today by Dr. Delton Coombes.  He reviewed your lab work which is normal/stable.  Your platelet count has improved since taking the Promacta. Continue Promacta as prescribed.  We will send a refill.   Return as scheduled in 2 weeks.    Thank you for choosing [place] at [facility] to provide your oncology and hematology care.  To afford each patient quality time with our provider, please arrive at least 15 minutes before your scheduled appointment time.   If you have a lab appointment with the [facility] please come in thru the Main Entrance and check in at the main information desk.  You need to re-schedule your appointment should you arrive 10 or more minutes late.  We strive to give you quality time with our providers, and arriving late affects you and other patients whose appointments are after yours.  Also, if you no show three or more times for appointments you may be dismissed from the clinic at the providers discretion.     Again, thank you for choosing [facility].  Our hope is that these requests will decrease the amount of time that you wait before being seen by our physicians.       _____________________________________________________________  Should you have questions after your visit to [facility], please contact our office at [phone] and follow the prompts.  Our office hours are 8:00 a.m. and 4:30 p.m. Monday - Friday.  Please note that voicemails left after 4:00 p.m. may not be returned until the following business day.  We are closed weekends and major holidays.  You do have access to a nurse 24-7, just call the main number to the clinic [phone] and do not press any options, hold on the line and a nurse will answer the phone.    For prescription refill requests, have your pharmacy contact our office and allow 72  hours.    Due to Covid, you will need to wear a mask upon entering the hospital. If you do not have a mask, a mask will be given to you at the Main Entrance upon arrival. For doctor visits, patients may have 1 support person age 31 or older with them. For treatment visits, patients can not have anyone with them due to social distancing guidelines and our immunocompromised population.

## 2022-01-12 NOTE — Progress Notes (Signed)
Madeline [name], [place] [address]   CLINIC:  Medical Oncology/Hematology  PCP:  Idelle Crouch, MD [place] / [place] [phone]  REASON FOR VISIT:  Follow-up for chronic ITP with exacerbations  PRIOR THERAPY: none  CURRENT THERAPY: surveillance  INTERVAL HISTORY:  Madeline [name], a 88 y.o. female, returns for routine follow-up for her chronic ITP with exacerbations. Madeline [name] was last seen on 12/29/2021.  Today she reports feeling good. She denies n/v/d, nosebleeds, current bleeding, and headaches.   REVIEW OF SYSTEMS:  Review of Systems  Constitutional:  Negative for appetite change and fatigue.  HENT:   Negative for nosebleeds.   Respiratory:  Negative for hemoptysis.   Gastrointestinal:  Negative for blood in stool, diarrhea, nausea and vomiting.  Genitourinary:  Negative for hematuria.   Neurological:  Negative for headaches.  All other systems reviewed and are negative.  PAST MEDICAL/SURGICAL HISTORY:  Past Medical History:  Diagnosis Date   Bradycardia    Coronary artery disease    Diabetes mellitus    Dyslipidemia    GERD (gastroesophageal reflux disease)    Hypothyroid    Presence of permanent cardiac pacemaker 12/16/2018   Medtronic dual lead pacemaker   Past Surgical History:  Procedure Laterality Date   APPENDECTOMY     BREAST BIOPSY Right 1967   EXCISIONAL - NEG   CHOLECYSTECTOMY     CORONARY STENT INTERVENTION     INSERT / REPLACE / REMOVE PACEMAKER  12/16/2018   no surgical hx     PACEMAKER IMPLANT N/A 12/16/2018   Procedure: PACEMAKER IMPLANT;  Surgeon: Evans Lance, MD;  Location: [facility];  Service: Cardiovascular;  Laterality: N/A;    SOCIAL HISTORY:  Social History   Socioeconomic History   Marital status: Married    Spouse name: Not on file   Number of children: Not on file   Years of education: Not on file   Highest education level:  Not on file  Occupational History   Not on file  Tobacco Use   Smoking status: Every Day    Packs/day: 1.00    Years: 50.00    Pack years: 50.00    Types: Cigarettes   Smokeless tobacco: Never  Vaping Use   Vaping Use: Never used  Substance and Sexual Activity   Alcohol use: No   Drug use: No   Sexual activity: Not on file  Other Topics Concern   Not on file  Social History Narrative   Not on file   Social Determinants of Health   Financial Resource Strain: Not on file  Food Insecurity: Not on file  Transportation Needs: Not on file  Physical Activity: Not on file  Stress: Not on file  Social Connections: Not on file  Intimate Partner Violence: Not on file    FAMILY HISTORY:  Family History  Problem Relation Age of Onset   Heart Problems Mother    Other Father        UNK   Cancer Sister        BREAST   Breast cancer Sister 53   Cancer Sister        BREAST   Breast cancer Sister 6   Cancer Sister        BREAST   Diabetes Son     CURRENT MEDICATIONS:  Current Outpatient Medications  Medication Sig Dispense Refill   acetaminophen (TYLENOL) 325 MG  tablet Take 1-2 tablets (325-650 mg total) by mouth every 6 (six) hours as needed for mild pain or moderate pain.     atorvastatin (LIPITOR) 20 MG tablet Take 1 tablet (20 mg total) by mouth daily at 6 PM. 90 tablet 3   cetirizine (ZYRTEC) 10 MG tablet Take 10 mg by mouth daily.     citalopram (CELEXA) 20 MG tablet Take 20 mg by mouth daily.     CONTOUR NEXT TEST test strip USE 1 STRIP TO CHECK BLOOD SUGAR EIGHT TO NINE TIMES DAILY.     eltrombopag (PROMACTA) 25 MG tablet Take 1 tablet (25 mg total) by mouth daily. Take on an empty stomach, 1 hour before a meal or 2 hours after. 30 tablet 0   Insulin Human (INSULIN PUMP) SOLN Inject 1 each into the skin continuous. Used with Novolog insulin     Lancets 28G MISC Use as instructed. 7-8 times daily.     levothyroxine (SYNTHROID, LEVOTHROID) 50 MCG tablet Take 50 mcg by  mouth daily before breakfast.   3   lisinopril (PRINIVIL,ZESTRIL) 20 MG tablet Take 1 tablet (20 mg total) by mouth daily. 30 tablet 1   Multiple Vitamin (MULTIVITAMIN WITH MINERALS) TABS tablet Take 1 tablet by mouth daily.     NOVOLOG 100 UNIT/ML injection Inject 0-50 Units into the skin continuous. Up to 50 units/ day via pump 10 mL 0   No current facility-administered medications for this visit.    ALLERGIES:  Allergies  Allergen Reactions   Darvon     "FEEL SICK"   Propoxyphene Nausea Only and Nausea And Vomiting   Sulfa Antibiotics Nausea Only and Nausea And Vomiting    "FEEL SICK"     PHYSICAL EXAM:  Performance status (ECOG): 1 - Symptomatic but completely ambulatory  There were no vitals filed for this visit. Wt Readings from Last 3 Encounters:  12/25/21 115 lb 9.6 oz (52.4 kg)  12/22/21 117 lb 4.6 oz (53.2 kg)  12/16/21 126 lb 15.8 oz (57.6 kg)   Physical Exam Vitals reviewed.  Constitutional:      Appearance: Normal appearance.  Cardiovascular:     Rate and Rhythm: Normal rate and regular rhythm.     Pulses: Normal pulses.     Heart sounds: Normal heart sounds.  Pulmonary:     Effort: Pulmonary effort is normal.     Breath sounds: Normal breath sounds.  Neurological:     General: No focal deficit present.     Mental Status: She is alert and oriented to person, place, and time.  Psychiatric:        Mood and Affect: Mood normal.        Behavior: Behavior normal.    LABORATORY DATA:  I have reviewed the labs as listed.  CBC Latest Ref Rng & Units 01/05/2022 12/29/2021 12/25/2021  WBC 4.0 - 10.5 K/uL - 8.0 6.4  Hemoglobin 12.0 - 15.0 g/dL - 11.6(L) 10.8(L)  Hematocrit 36.0 - 46.0 % - 37.8 34.3(L)  Platelets 150 - 400 K/uL 258 274 16(LL)   CMP Latest Ref Rng & Units 12/29/2021 12/25/2021 12/22/2021  Glucose 70 - 99 mg/dL 125(H) 93 94  BUN 8 - 23 mg/dL 10 9 11   Creatinine 0.44 - 1.00 mg/dL 0.67 0.68 0.60  Sodium 135 - 145 mmol/L 138 137 139  Potassium 3.5 - 5.1  mmol/L 4.4 3.6 3.3(L)  Chloride 98 - 111 mmol/L 101 102 103  CO2 22 - 32 mmol/L 27 28 26   Calcium  8.9 - 10.3 mg/dL 8.5(L) 7.9(L) 8.2(L)  Total Protein 6.5 - 8.1 g/dL 7.1 6.3(L) 6.9  Total Bilirubin 0.3 - 1.2 mg/dL 0.6 0.5 0.3  Alkaline Phos 38 - 126 U/L 76 62 73  AST 15 - 41 U/L 43(H) 46(H) 34  ALT 0 - 44 U/L 34 33 25      Component Value Date/Time   RBC 3.90 12/29/2021 0954   MCV 96.9 12/29/2021 0954   MCV 92 12/09/2018 1308   MCH 29.7 12/29/2021 0954   MCHC 30.7 12/29/2021 0954   RDW 14.1 12/29/2021 0954   RDW 12.2 12/09/2018 1308   LYMPHSABS 1.9 12/29/2021 0954   LYMPHSABS 2.0 12/09/2018 1308   MONOABS 0.9 12/29/2021 0954   EOSABS 0.1 12/29/2021 0954   EOSABS 0.2 12/09/2018 1308   BASOSABS 0.1 12/29/2021 0954   BASOSABS 0.0 12/09/2018 1308    DIAGNOSTIC IMAGING:  I have independently reviewed the scans and discussed with the patient. CT HEAD WO CONTRAST (5MM)  Result Date: 12/15/2021 CLINICAL DATA:  Sudden severe headache EXAM: CT HEAD WITHOUT CONTRAST TECHNIQUE: Contiguous axial images were obtained from the base of the skull through the vertex without intravenous contrast. RADIATION DOSE REDUCTION: This exam was performed according to the departmental dose-optimization program which includes automated exposure control, adjustment of the mA and/or kV according to patient size and/or use of iterative reconstruction technique. COMPARISON:  CT head dated September 06, 2012 FINDINGS: Brain: No evidence of acute infarction, hemorrhage, hydrocephalus, extra-axial collection or mass lesion/mass effect. Low attenuation of periventricular and subcortical white matter presumed chronic microvascular ischemic changes. Minimal scattered bilateral dystrophic basal ganglia calcifications. Vascular: No hyperdense vessel or unexpected calcification. Skull: Normal. Negative for fracture or focal lesion. Sinuses/Orbits: No acute finding. Other: None IMPRESSION: 1.  No acute intracranial abnormality.  2.  Mild chronic microvascular ischemic changes of the white matter. Electronically Signed   By: Keane Police D.O.   On: 12/15/2021 14:47   Portable chest x-ray (1 view)  Result Date: 12/15/2021 CLINICAL DATA:  DKA EXAM: PORTABLE CHEST 1 VIEW COMPARISON:  Chest x-ray 12/17/2018, CT chest 08/08/2018 FINDINGS: Left chest wall dual lead pacemaker in similar position. The heart and mediastinal contours are unchanged. Aortic calcification. Severe mitral annular calcifications. Biapical pleural/pulmonary scarring. No focal consolidation. Chronic coarsened interstitial markings. No pleural effusion. No pneumothorax. No acute osseous abnormality. IMPRESSION: 1. No active disease. 2. Aortic Atherosclerosis (ICD10-I70.0) including mitral annular calcification. Electronically Signed   By: Iven Finn M.D.   On: 12/15/2021 17:21   MM 3D SCREEN BREAST BILATERAL  Result Date: 12/29/2021 CLINICAL DATA:  Screening. EXAM: DIGITAL SCREENING BILATERAL MAMMOGRAM WITH TOMOSYNTHESIS AND CAD TECHNIQUE: Bilateral screening digital craniocaudal and mediolateral oblique mammograms were obtained. Bilateral screening digital breast tomosynthesis was performed. The images were evaluated with computer-aided detection. COMPARISON:  Previous exam(s). ACR Breast Density Category b: There are scattered areas of fibroglandular density. FINDINGS: There are no findings suspicious for malignancy. IMPRESSION: No mammographic evidence of malignancy. A result letter of this screening mammogram will be mailed directly to the patient. RECOMMENDATION: Screening mammogram in one year. (Code:SM-B-01Y) BI-RADS CATEGORY  1: Negative. Electronically Signed   By: Ammie Ferrier M.D.   On: 12/29/2021 11:32  ECHOCARDIOGRAM COMPLETE  Result Date: 12/25/2021    ECHOCARDIOGRAM REPORT   Patient Name:   Madeline [name] Date of Exam: 12/25/2021 Medical Rec #:  [id number]        Height:       62.0 in Accession #:  [id number]       Weight:       115.6 lb  Date of Birth:  1933-12-01         BSA:          1.514 m Patient Age:    17 years         BP:           157/74 mmHg Patient Gender: F                HR:           75 bpm. Exam Location:  [facility] Procedure: 2D Echo, Cardiac Doppler and Color Doppler Indications:    I35.0 Aortic Stenosis  History:        Patient has prior history of Echocardiogram examinations, most                 recent 12/18/2021. CAD, Pacemaker, Arrythmias:Bradycardia; Risk                 Factors:Diabetes and Dyslipidemia. Hypothyroidism.  Sonographer:    Cresenciano Lick RDCS Referring Phys: [id number] PAULA V ROSS IMPRESSIONS  1. Left ventricular ejection fraction, by estimation, is 60 to 65%. The left ventricle has normal function. The left ventricle has no regional wall motion abnormalities. There is mild concentric left ventricular hypertrophy. Left ventricular diastolic parameters are consistent with Grade I diastolic dysfunction (impaired relaxation). Elevated left ventricular end-diastolic pressure.  2. Right ventricular systolic function is normal. The right ventricular size is normal. Tricuspid regurgitation signal is inadequate for assessing PA pressure.  3. The pericardial effusion is anterior to the right ventricle.  4. The mitral valve is normal in structure. No evidence of mitral valve regurgitation. No evidence of mitral stenosis. Severe mitral annular calcification.  5. The aortic valve is calcified. There is moderate calcification of the aortic valve. There is moderate thickening of the aortic valve. Aortic valve regurgitation is not visualized. Mild aortic valve stenosis. Aortic valve area, by VTI measures 1.49 cm. Aortic valve mean gradient measures 8.0 mmHg. Aortic valve Vmax measures 1.73 m/s. Di 0.47  6. The inferior vena cava is normal in size with greater than 50% respiratory variability, suggesting right atrial pressure of 3 mmHg. FINDINGS  Left Ventricle: Left ventricular ejection fraction, by estimation, is 60 to  65%. The left ventricle has normal function. The left ventricle has no regional wall motion abnormalities. The left ventricular internal cavity size was normal in size. There is  mild concentric left ventricular hypertrophy. Left ventricular diastolic parameters are consistent with Grade I diastolic dysfunction (impaired relaxation). Elevated left ventricular end-diastolic pressure. Right Ventricle: The right ventricular size is normal. No increase in right ventricular wall thickness. Right ventricular systolic function is normal. Tricuspid regurgitation signal is inadequate for assessing PA pressure. Left Atrium: Left atrial size was normal in size. Right Atrium: Right atrial size was normal in size. Pericardium: Trivial pericardial effusion is present. The pericardial effusion is anterior to the right ventricle. Mitral Valve: The mitral valve is normal in structure. Severe mitral annular calcification. No evidence of mitral valve regurgitation. No evidence of mitral valve stenosis. Tricuspid Valve: The tricuspid valve is normal in structure. Tricuspid valve regurgitation is not demonstrated. No evidence of tricuspid stenosis. Aortic Valve: The aortic valve is calcified. There is moderate calcification of the aortic valve. There is moderate thickening of the aortic valve. Aortic valve regurgitation is not visualized. Mild aortic stenosis is present. Aortic valve mean gradient measures 8.0 mmHg. Aortic  valve peak gradient measures 12.0 mmHg. Aortic valve area, by VTI measures 1.49 cm. Pulmonic Valve: The pulmonic valve was normal in structure. Pulmonic valve regurgitation is trivial. No evidence of pulmonic stenosis. Aorta: The aortic root is normal in size and structure. Venous: The inferior vena cava is normal in size with greater than 50% respiratory variability, suggesting right atrial pressure of 3 mmHg. IAS/Shunts: No atrial level shunt detected by color flow Doppler. Additional Comments: A device lead is  visualized.  LEFT VENTRICLE PLAX 2D LVIDd:         3.90 cm   Diastology LVIDs:         2.70 cm   LV e' medial:    4.13 cm/s LV PW:         1.10 cm   LV E/e' medial:  30.8 LV IVS:        1.10 cm   LV e' lateral:   5.77 cm/s LVOT diam:     2.00 cm   LV E/e' lateral: 22.0 LV SV:         59 LV SV Index:   39 LVOT Area:     3.14 cm  RIGHT VENTRICLE             IVC RV Basal diam:  3.60 cm     IVC diam: 1.00 cm RV S prime:     13.10 cm/s TAPSE (M-mode): 2.2 cm LEFT ATRIUM             Index        RIGHT ATRIUM          Index LA diam:        3.80 cm 2.51 cm/m   RA Area:     9.95 cm LA Vol (A2C):   48.1 ml 31.76 ml/m  RA Volume:   22.00 ml 14.53 ml/m LA Vol (A4C):   38.0 ml 25.09 ml/m LA Biplane Vol: 45.0 ml 29.71 ml/m  AORTIC VALVE AV Area (Vmax):    1.59 cm AV Area (Vmean):   1.36 cm AV Area (VTI):     1.49 cm AV Vmax:           173.40 cm/s AV Vmean:          136.000 cm/s AV VTI:            0.395 m AV Peak Grad:      12.0 mmHg AV Mean Grad:      8.0 mmHg LVOT Vmax:         87.67 cm/s LVOT Vmean:        58.933 cm/s LVOT VTI:          0.187 m LVOT/AV VTI ratio: 0.47  AORTA Ao Root diam: 2.80 cm Ao Asc diam:  3.20 cm MITRAL VALVE MV Area (PHT): 3.10 cm     SHUNTS MV Decel Time: 245 msec     Systemic VTI:  0.19 m MV E velocity: 127.00 cm/s  Systemic Diam: 2.00 cm MV A velocity: 175.00 cm/s MV E/A ratio:  0.73 Fransico Him MD Electronically signed by Fransico Him MD Signature Date/Time: 12/25/2021/1:53:56 PM    Final    ECHOCARDIOGRAM COMPLETE  Result Date: 12/18/2021    ECHOCARDIOGRAM REPORT   Patient Name:   Madeline [name] Date of Exam: 12/18/2021 Medical Rec #:  [id number]        Height:       62.0 in Accession #:    [id number]  Weight:       127.0 lb Date of Birth:  1934/08/01         BSA:          1.576 m Patient Age:    22 years         BP:           157/68 mmHg Patient Gender: F                HR:           77 bpm. Exam Location:  [facility] Procedure: 2D Echo, Color Doppler and Cardiac Doppler Indications:      Elevated troponin  History:         Patient has prior history of Echocardiogram examinations. CAD,                  Pacemaker; Risk Factors:Diabetes and Dyslipidemia.  Sonographer:     Charmayne Sheer Referring Phys:  [id number] Terrilee Croak Diagnosing Phys: Ida Rogue MD  Sonographer Comments: Suboptimal parasternal window and no subcostal window. IMPRESSIONS  1. Left ventricular ejection fraction, by estimation, is 60 to 65%. The left ventricle has normal function. The left ventricle has no regional wall motion abnormalities. There is mild left ventricular hypertrophy. Left ventricular diastolic parameters are consistent with Grade I diastolic dysfunction (impaired relaxation).  2. Right ventricular systolic function is normal. The right ventricular size is normal.  3. The mitral valve is normal in structure. No evidence of mitral valve regurgitation. Mild mitral stenosis. The mean mitral valve gradient is 6.0 mmHg. Moderate mitral annular calcification.  4. The aortic valve is normal in structure. Aortic valve regurgitation is not visualized. No aortic stenosis is present.  5. The inferior vena cava is normal in size with greater than 50% respiratory variability, suggesting right atrial pressure of 3 mmHg. FINDINGS  Left Ventricle: Left ventricular ejection fraction, by estimation, is 60 to 65%. The left ventricle has normal function. The left ventricle has no regional wall motion abnormalities. The left ventricular internal cavity size was normal in size. There is  mild left ventricular hypertrophy. Left ventricular diastolic parameters are consistent with Grade I diastolic dysfunction (impaired relaxation). Right Ventricle: The right ventricular size is normal. No increase in right ventricular wall thickness. Right ventricular systolic function is normal. Left Atrium: Left atrial size was normal in size. Right Atrium: Right atrial size was normal in size. Pericardium: There is no evidence of pericardial effusion.  Mitral Valve: The mitral valve is normal in structure. Moderate mitral annular calcification. No evidence of mitral valve regurgitation. Mild mitral valve stenosis. MV peak gradient, 11.7 mmHg. The mean mitral valve gradient is 6.0 mmHg. Tricuspid Valve: The tricuspid valve is normal in structure. Tricuspid valve regurgitation is not demonstrated. No evidence of tricuspid stenosis. Aortic Valve: The aortic valve is normal in structure. Aortic valve regurgitation is not visualized. No aortic stenosis is present. Aortic valve mean gradient measures 7.0 mmHg. Aortic valve peak gradient measures 13.1 mmHg. Aortic valve area, by VTI measures 1.73 cm. Pulmonic Valve: The pulmonic valve was normal in structure. Pulmonic valve regurgitation is not visualized. No evidence of pulmonic stenosis. Aorta: The aortic root is normal in size and structure. Venous: The inferior vena cava is normal in size with greater than 50% respiratory variability, suggesting right atrial pressure of 3 mmHg. IAS/Shunts: No atrial level shunt detected by color flow Doppler.  LEFT VENTRICLE PLAX 2D LVIDd:         4.26 cm  Diastology LVIDs:         3.64 cm     LV e' medial:    6.31 cm/s LV PW:         1.01 cm     LV E/e' medial:  20.8 LV IVS:        0.99 cm     LV e' lateral:   7.83 cm/s LVOT diam:     1.80 cm     LV E/e' lateral: 16.7 LV SV:         60 LV SV Index:   38 LVOT Area:     2.54 cm  LV Volumes (MOD) LV vol d, MOD A2C: 66.0 ml LV vol d, MOD A4C: 81.5 ml LV vol s, MOD A2C: 36.2 ml LV vol s, MOD A4C: 43.0 ml LV SV MOD A2C:     29.8 ml LV SV MOD A4C:     81.5 ml LV SV MOD BP:      33.5 ml RIGHT VENTRICLE RV Basal diam:  3.02 cm RV S prime:     9.57 cm/s LEFT ATRIUM           Index        RIGHT ATRIUM          Index LA diam:      3.80 cm 2.41 cm/m   RA Area:     8.55 cm LA Vol (A2C): 34.8 ml 22.08 ml/m  RA Volume:   17.40 ml 11.04 ml/m  AORTIC VALVE                     PULMONIC VALVE AV Area (Vmax):    1.63 cm      PV Vmax:        0.88 m/s AV Area (Vmean):   1.72 cm      PV Vmean:      63.900 cm/s AV Area (VTI):     1.73 cm      PV VTI:        0.189 m AV Vmax:           181.00 cm/s   PV Peak grad:  3.1 mmHg AV Vmean:          122.000 cm/s  PV Mean grad:  2.0 mmHg AV VTI:            0.348 m AV Peak Grad:      13.1 mmHg AV Mean Grad:      7.0 mmHg LVOT Vmax:         116.00 cm/s LVOT Vmean:        82.500 cm/s LVOT VTI:          0.237 m LVOT/AV VTI ratio: 0.68  AORTA Ao Root diam: 2.80 cm MITRAL VALVE MV Area (PHT): 6.54 cm     SHUNTS MV Area VTI:   1.57 cm     Systemic VTI:  0.24 m MV Peak grad:  11.7 mmHg    Systemic Diam: 1.80 cm MV Mean grad:  6.0 mmHg MV Vmax:       1.71 m/s MV Vmean:      113.0 cm/s MV Decel Time: 116 msec MV E velocity: 131.00 cm/s MV A velocity: 139.00 cm/s MV E/A ratio:  0.94 Ida Rogue MD Electronically signed by Ida Rogue MD Signature Date/Time: 12/18/2021/6:25:03 PM    Final      ASSESSMENT:  1.  Chronic ITP with exacerbations: Patient had thrombocytopenia since July 2016.  CT  abdomen on 02/02/2018 showed normal-sized spleen.  Bone marrow aspiration and biopsy in 09/15/2017 showed hypercellular marrow for age, trilineage hematopoiesis with abundant megakaryocytes with no dysplasia. - Prednisone tapering dose over 5 weeks given on 12/14/2017 with no improvement in platelet count - IVIG (1 g/kg) on 12/23/2017 and 12/24/2017 with peak platelet count of 92, response lasting about 3 weeks -Dexamethasone 40 mg for 4 days on 01/11/2018 with improvement in platelet count of 61. - IVIG 1 g/kg on 12/18/2021 and 12/19/2021 without response. - Nplate 2 mcg/kg on 624THL. - Promacta 25 mg daily started on 12/26/2021.     2.  Right middle lobe lung nodule: -CT of the chest on 08/08/2018 showed right middle lobe lung nodule with one-year follow-up recommended.    PLAN:  1.  Chronic ITP with exacerbations: - She is continuing Promacta 25 mg daily. - She has some bruising on the forearms.  No bleeding.  No GI side  effects noted. - Reviewed labs from today which shows normal LFTs.  Platelet count today is 155. - We will continue Promacta at the same dose.  RTC 2 weeks for follow-up with repeat labs.  We will consider Promacta every other day if platelet count staying about 250.   2.  Right middle lobe lung nodule: - She did not have any repeat CT scan after 08/08/2018. - We will consider repeat CT scan without contrast in the near future.  3.  Health maintenance: - Mammogram on 12/29/2021 was BI-RADS Category 1.  Orders placed this encounter:  No orders of the defined types were placed in this encounter.    Derek Jack, MD [place] [phone]   I, Thana Ates, am acting as a scribe for Dr. Derek Jack.  I, Derek Jack MD, have reviewed the above documentation for accuracy and completeness, and I agree with the above.

## 2022-01-14 DIAGNOSIS — I2510 Atherosclerotic heart disease of native coronary artery without angina pectoris: Secondary | ICD-10-CM | POA: Diagnosis not present

## 2022-01-14 DIAGNOSIS — Z950 Presence of cardiac pacemaker: Secondary | ICD-10-CM | POA: Diagnosis not present

## 2022-01-14 DIAGNOSIS — R001 Bradycardia, unspecified: Secondary | ICD-10-CM | POA: Diagnosis not present

## 2022-01-14 DIAGNOSIS — I10 Essential (primary) hypertension: Secondary | ICD-10-CM | POA: Diagnosis not present

## 2022-01-14 DIAGNOSIS — D693 Immune thrombocytopenic purpura: Secondary | ICD-10-CM | POA: Diagnosis not present

## 2022-01-14 DIAGNOSIS — I442 Atrioventricular block, complete: Secondary | ICD-10-CM | POA: Diagnosis not present

## 2022-01-14 DIAGNOSIS — G9341 Metabolic encephalopathy: Secondary | ICD-10-CM | POA: Diagnosis not present

## 2022-01-14 DIAGNOSIS — I252 Old myocardial infarction: Secondary | ICD-10-CM | POA: Diagnosis not present

## 2022-01-14 DIAGNOSIS — N179 Acute kidney failure, unspecified: Secondary | ICD-10-CM | POA: Diagnosis not present

## 2022-01-14 DIAGNOSIS — Z9641 Presence of insulin pump (external) (internal): Secondary | ICD-10-CM | POA: Diagnosis not present

## 2022-01-14 DIAGNOSIS — R778 Other specified abnormalities of plasma proteins: Secondary | ICD-10-CM | POA: Diagnosis not present

## 2022-01-14 DIAGNOSIS — K219 Gastro-esophageal reflux disease without esophagitis: Secondary | ICD-10-CM | POA: Diagnosis not present

## 2022-01-14 DIAGNOSIS — E785 Hyperlipidemia, unspecified: Secondary | ICD-10-CM | POA: Diagnosis not present

## 2022-01-14 DIAGNOSIS — E039 Hypothyroidism, unspecified: Secondary | ICD-10-CM | POA: Diagnosis not present

## 2022-01-14 DIAGNOSIS — E1010 Type 1 diabetes mellitus with ketoacidosis without coma: Secondary | ICD-10-CM | POA: Diagnosis not present

## 2022-01-14 DIAGNOSIS — I959 Hypotension, unspecified: Secondary | ICD-10-CM | POA: Diagnosis not present

## 2022-01-14 DIAGNOSIS — E875 Hyperkalemia: Secondary | ICD-10-CM | POA: Diagnosis not present

## 2022-01-14 DIAGNOSIS — F329 Major depressive disorder, single episode, unspecified: Secondary | ICD-10-CM | POA: Diagnosis not present

## 2022-01-23 DIAGNOSIS — Z9641 Presence of insulin pump (external) (internal): Secondary | ICD-10-CM | POA: Diagnosis not present

## 2022-01-23 DIAGNOSIS — Z8639 Personal history of other endocrine, nutritional and metabolic disease: Secondary | ICD-10-CM | POA: Diagnosis not present

## 2022-01-23 DIAGNOSIS — E109 Type 1 diabetes mellitus without complications: Secondary | ICD-10-CM | POA: Diagnosis not present

## 2022-01-26 DIAGNOSIS — E1043 Type 1 diabetes mellitus with diabetic autonomic (poly)neuropathy: Secondary | ICD-10-CM | POA: Diagnosis not present

## 2022-01-26 DIAGNOSIS — Z794 Long term (current) use of insulin: Secondary | ICD-10-CM | POA: Diagnosis not present

## 2022-01-26 DIAGNOSIS — E1065 Type 1 diabetes mellitus with hyperglycemia: Secondary | ICD-10-CM | POA: Diagnosis not present

## 2022-02-03 ENCOUNTER — Inpatient Hospital Stay (HOSPITAL_COMMUNITY): Payer: HMO | Attending: Hematology | Admitting: Hematology

## 2022-02-03 ENCOUNTER — Inpatient Hospital Stay (HOSPITAL_COMMUNITY): Payer: HMO

## 2022-02-12 DIAGNOSIS — E1065 Type 1 diabetes mellitus with hyperglycemia: Secondary | ICD-10-CM | POA: Diagnosis not present

## 2022-02-13 ENCOUNTER — Other Ambulatory Visit (HOSPITAL_COMMUNITY): Payer: Self-pay

## 2022-02-18 ENCOUNTER — Other Ambulatory Visit (HOSPITAL_COMMUNITY): Payer: Self-pay | Admitting: *Deleted

## 2022-02-19 ENCOUNTER — Other Ambulatory Visit (HOSPITAL_COMMUNITY): Payer: Self-pay

## 2022-02-19 ENCOUNTER — Other Ambulatory Visit (HOSPITAL_COMMUNITY): Payer: Self-pay | Admitting: *Deleted

## 2022-02-19 DIAGNOSIS — Z79899 Other long term (current) drug therapy: Secondary | ICD-10-CM | POA: Diagnosis not present

## 2022-02-19 DIAGNOSIS — E7800 Pure hypercholesterolemia, unspecified: Secondary | ICD-10-CM | POA: Diagnosis not present

## 2022-02-19 DIAGNOSIS — D696 Thrombocytopenia, unspecified: Secondary | ICD-10-CM

## 2022-02-19 DIAGNOSIS — I10 Essential (primary) hypertension: Secondary | ICD-10-CM | POA: Diagnosis not present

## 2022-02-19 DIAGNOSIS — E039 Hypothyroidism, unspecified: Secondary | ICD-10-CM | POA: Diagnosis not present

## 2022-02-19 DIAGNOSIS — D693 Immune thrombocytopenic purpura: Secondary | ICD-10-CM

## 2022-02-19 DIAGNOSIS — E1043 Type 1 diabetes mellitus with diabetic autonomic (poly)neuropathy: Secondary | ICD-10-CM | POA: Diagnosis not present

## 2022-02-19 MED ORDER — ELTROMBOPAG OLAMINE 25 MG PO TABS: 25.0000 mg | ORAL_TABLET | Freq: Every day | ORAL | 0 refills | Status: DC

## 2022-02-19 NOTE — Telephone Encounter (Signed)
Prescription sent to [facility] by [employer] in [place], [place].   ?

## 2022-02-19 NOTE — Telephone Encounter (Signed)
Refill request received for Promacta.  Patient No Showed last appointment, therefore new follow up with labs made with Dr Delton Coombes for April 19 th.  Script sent with no refills. ?

## 2022-02-23 ENCOUNTER — Other Ambulatory Visit (HOSPITAL_COMMUNITY): Payer: Self-pay

## 2022-02-23 DIAGNOSIS — E1065 Type 1 diabetes mellitus with hyperglycemia: Secondary | ICD-10-CM | POA: Diagnosis not present

## 2022-02-23 DIAGNOSIS — F17200 Nicotine dependence, unspecified, uncomplicated: Secondary | ICD-10-CM | POA: Diagnosis not present

## 2022-02-23 DIAGNOSIS — E1043 Type 1 diabetes mellitus with diabetic autonomic (poly)neuropathy: Secondary | ICD-10-CM | POA: Diagnosis not present

## 2022-02-23 DIAGNOSIS — E10649 Type 1 diabetes mellitus with hypoglycemia without coma: Secondary | ICD-10-CM | POA: Diagnosis not present

## 2022-02-23 DIAGNOSIS — Z9641 Presence of insulin pump (external) (internal): Secondary | ICD-10-CM | POA: Diagnosis not present

## 2022-02-24 ENCOUNTER — Other Ambulatory Visit (HOSPITAL_COMMUNITY): Payer: Self-pay | Admitting: *Deleted

## 2022-02-26 DIAGNOSIS — Z794 Long term (current) use of insulin: Secondary | ICD-10-CM | POA: Diagnosis not present

## 2022-02-26 DIAGNOSIS — E1043 Type 1 diabetes mellitus with diabetic autonomic (poly)neuropathy: Secondary | ICD-10-CM | POA: Diagnosis not present

## 2022-02-26 DIAGNOSIS — E1065 Type 1 diabetes mellitus with hyperglycemia: Secondary | ICD-10-CM | POA: Diagnosis not present

## 2022-03-04 ENCOUNTER — Inpatient Hospital Stay (HOSPITAL_COMMUNITY): Payer: HMO | Attending: Hematology | Admitting: Hematology

## 2022-03-04 ENCOUNTER — Inpatient Hospital Stay (HOSPITAL_COMMUNITY): Payer: HMO

## 2022-03-04 VITALS — BP 133/58 | HR 81 | Temp 98.4°F | Resp 18 | Ht 62.0 in | Wt 117.7 lb

## 2022-03-04 DIAGNOSIS — Z803 Family history of malignant neoplasm of breast: Secondary | ICD-10-CM | POA: Insufficient documentation

## 2022-03-04 DIAGNOSIS — D693 Immune thrombocytopenic purpura: Secondary | ICD-10-CM | POA: Diagnosis not present

## 2022-03-04 DIAGNOSIS — Z833 Family history of diabetes mellitus: Secondary | ICD-10-CM | POA: Diagnosis not present

## 2022-03-04 DIAGNOSIS — D696 Thrombocytopenia, unspecified: Secondary | ICD-10-CM

## 2022-03-04 DIAGNOSIS — R0602 Shortness of breath: Secondary | ICD-10-CM | POA: Insufficient documentation

## 2022-03-04 DIAGNOSIS — G479 Sleep disorder, unspecified: Secondary | ICD-10-CM | POA: Insufficient documentation

## 2022-03-04 DIAGNOSIS — Z9049 Acquired absence of other specified parts of digestive tract: Secondary | ICD-10-CM | POA: Diagnosis not present

## 2022-03-04 DIAGNOSIS — Z882 Allergy status to sulfonamides status: Secondary | ICD-10-CM | POA: Insufficient documentation

## 2022-03-04 DIAGNOSIS — F17210 Nicotine dependence, cigarettes, uncomplicated: Secondary | ICD-10-CM | POA: Insufficient documentation

## 2022-03-04 DIAGNOSIS — I2510 Atherosclerotic heart disease of native coronary artery without angina pectoris: Secondary | ICD-10-CM | POA: Insufficient documentation

## 2022-03-04 DIAGNOSIS — R911 Solitary pulmonary nodule: Secondary | ICD-10-CM | POA: Diagnosis not present

## 2022-03-04 DIAGNOSIS — E039 Hypothyroidism, unspecified: Secondary | ICD-10-CM | POA: Diagnosis not present

## 2022-03-04 DIAGNOSIS — Z79899 Other long term (current) drug therapy: Secondary | ICD-10-CM | POA: Insufficient documentation

## 2022-03-04 DIAGNOSIS — E119 Type 2 diabetes mellitus without complications: Secondary | ICD-10-CM | POA: Insufficient documentation

## 2022-03-04 DIAGNOSIS — Z8249 Family history of ischemic heart disease and other diseases of the circulatory system: Secondary | ICD-10-CM | POA: Insufficient documentation

## 2022-03-04 LAB — COMPREHENSIVE METABOLIC PANEL
ALT: 16 U/L (ref 0–44)
AST: 20 U/L (ref 15–41)
Albumin: 3.5 g/dL (ref 3.5–5.0)
Alkaline Phosphatase: 78 U/L (ref 38–126)
Anion gap: 6 (ref 5–15)
BUN: 13 mg/dL (ref 8–23)
CO2: 29 mmol/L (ref 22–32)
Calcium: 8.3 mg/dL — ABNORMAL LOW (ref 8.9–10.3)
Chloride: 108 mmol/L (ref 98–111)
Creatinine, Ser: 0.82 mg/dL (ref 0.44–1.00)
GFR, Estimated: >60 mL/min (ref >60)
Glucose, Bld: 126 mg/dL — ABNORMAL HIGH (ref 70–99)
Potassium: 4 mmol/L (ref 3.5–5.1)
Sodium: 143 mmol/L (ref 135–145)
Total Bilirubin: 0.3 mg/dL (ref 0.3–1.2)
Total Protein: 6.1 g/dL — ABNORMAL LOW (ref 6.5–8.1)

## 2022-03-04 LAB — CBC
HCT: 36.4 % (ref 36.0–46.0)
Hemoglobin: 11.4 g/dL — ABNORMAL LOW (ref 12.0–15.0)
MCH: 29.2 pg (ref 26.0–34.0)
MCHC: 31.3 g/dL (ref 30.0–36.0)
MCV: 93.3 fL (ref 80.0–100.0)
Platelets: 195 10*3/uL (ref 150–400)
RBC: 3.9 MIL/uL (ref 3.87–5.11)
RDW: 13 % (ref 11.5–15.5)
WBC: 5.4 10*3/uL (ref 4.0–10.5)
nRBC: 0 % (ref 0.0–0.2)

## 2022-03-04 MED ORDER — ELTROMBOPAG OLAMINE 25 MG PO TABS: 25.0000 mg | ORAL_TABLET | Freq: Every day | ORAL | 3 refills | Status: DC

## 2022-03-04 NOTE — Progress Notes (Signed)
[facility] [address], [place] [address]   CLINIC:  Medical Oncology/Hematology  PCP:  Marguarite Arbour, MD [address] [facility] [place] / [place] [phone]  REASON FOR VISIT:  Follow-up for chronic ITP with exacerbations  PRIOR THERAPY: none  CURRENT THERAPY: surveillance  INTERVAL HISTORY:  [name], a 88 y.o. female, returns for routine follow-up for her chronic ITP with exacerbations. [name] was last seen on 01/12/2022.  Today she reports feeling good. She denies current bleeding. She is taking Promacta and tolerating it well. She is smoking 1 ppd.   REVIEW OF SYSTEMS:  Review of Systems  Constitutional:  Negative for appetite change and fatigue.  HENT:   Negative for nosebleeds.   Respiratory:  Positive for shortness of breath. Negative for hemoptysis.   Gastrointestinal:  Negative for blood in stool.  Genitourinary:  Negative for hematuria.   Psychiatric/Behavioral:  Positive for sleep disturbance.   All other systems reviewed and are negative.  PAST MEDICAL/SURGICAL HISTORY:  Past Medical History:  Diagnosis Date   Bradycardia    Coronary artery disease    Diabetes mellitus    Dyslipidemia    GERD (gastroesophageal reflux disease)    Hypothyroid    Presence of permanent cardiac pacemaker 12/16/2018   Medtronic dual lead pacemaker   Past Surgical History:  Procedure Laterality Date   APPENDECTOMY     BREAST BIOPSY Right 1967   EXCISIONAL - NEG   CHOLECYSTECTOMY     CORONARY STENT INTERVENTION     INSERT / REPLACE / REMOVE PACEMAKER  12/16/2018   no surgical hx     PACEMAKER IMPLANT N/A 12/16/2018   Procedure: PACEMAKER IMPLANT;  Surgeon: Marinus Maw, MD;  Location: [facility] IN[facility];  Service: Cardiovascular;  Laterality: N/A;    SOCIAL HISTORY:  Social History   Socioeconomic History   Marital status: Married    Spouse name: Not on file   Number of children: Not on file    Years of education: Not on file   Highest education level: Not on file  Occupational History   Not on file  Tobacco Use   Smoking status: Every Day    Packs/day: 1.00    Years: 50.00    Pack years: 50.00    Types: Cigarettes   Smokeless tobacco: Never  Vaping Use   Vaping Use: Never used  Substance and Sexual Activity   Alcohol use: No   Drug use: No   Sexual activity: Not on file  Other Topics Concern   Not on file  Social History Narrative   Not on file   Social Determinants of Health   Financial Resource Strain: Not on file  Food Insecurity: Not on file  Transportation Needs: Not on file  Physical Activity: Not on file  Stress: Not on file  Social Connections: Not on file  Intimate Partner Violence: Not on file    FAMILY HISTORY:  Family History  Problem Relation Age of Onset   Heart Problems Mother    Other Father        UNK   Cancer Sister        BREAST   Breast cancer Sister 26   Cancer Sister        BREAST   Breast cancer Sister 14   Cancer Sister        BREAST   Diabetes Son     CURRENT MEDICATIONS:  Current Outpatient  Medications  Medication Sig Dispense Refill   acetaminophen (TYLENOL) 325 MG tablet Take 1-2 tablets (325-650 mg total) by mouth every 6 (six) hours as needed for mild pain or moderate pain.     atorvastatin (LIPITOR) 20 MG tablet Take 1 tablet (20 mg total) by mouth daily at 6 PM. 90 tablet 3   cetirizine (ZYRTEC) 10 MG tablet Take 10 mg by mouth daily.     citalopram (CELEXA) 20 MG tablet Take 20 mg by mouth daily.     CONTOUR NEXT TEST test strip USE 1 STRIP TO CHECK BLOOD SUGAR EIGHT TO NINE TIMES DAILY.     eltrombopag (PROMACTA) 25 MG tablet Take 1 tablet (25 mg total) by mouth daily. Take on an empty stomach, 1 hour before a meal or 2 hours after. 30 tablet 0   Insulin Human (INSULIN PUMP) SOLN Inject 1 each into the skin continuous. Used with Novolog insulin     Lancets 28G MISC Use as instructed. 7-8 times daily.      levothyroxine (SYNTHROID, LEVOTHROID) 50 MCG tablet Take 50 mcg by mouth daily before breakfast.   3   lisinopril (PRINIVIL,ZESTRIL) 20 MG tablet Take 1 tablet (20 mg total) by mouth daily. 30 tablet 1   Multiple Vitamin (MULTIVITAMIN WITH MINERALS) TABS tablet Take 1 tablet by mouth daily.     NOVOLOG 100 UNIT/ML injection Inject 0-50 Units into the skin continuous. Up to 50 units/ day via pump 10 mL 0   No current facility-administered medications for this visit.    ALLERGIES:  Allergies  Allergen Reactions   Darvon     "FEEL SICK"   Propoxyphene Nausea Only and Nausea And Vomiting   Sulfa Antibiotics Nausea Only and Nausea And Vomiting    "FEEL SICK"     PHYSICAL EXAM:  Performance status (ECOG): 1 - Symptomatic but completely ambulatory  There were no vitals filed for this visit. Wt Readings from Last 3 Encounters:  01/12/22 113 lb (51.3 kg)  12/25/21 115 lb 9.6 oz (52.4 kg)  12/22/21 117 lb 4.6 oz (53.2 kg)   Physical Exam Vitals reviewed.  Constitutional:      Appearance: Normal appearance.  Cardiovascular:     Rate and Rhythm: Normal rate and regular rhythm.     Pulses: Normal pulses.     Heart sounds: Normal heart sounds.  Pulmonary:     Effort: Pulmonary effort is normal.     Breath sounds: Normal breath sounds.  Neurological:     General: No focal deficit present.     Mental Status: She is alert and oriented to person, place, and time.  Psychiatric:        Mood and Affect: Mood normal.        Behavior: Behavior normal.    LABORATORY DATA:  I have reviewed the labs as listed.     Latest Ref Rng & Units 03/04/2022    1:53 PM 01/12/2022   11:07 AM 01/05/2022   10:52 AM  CBC  WBC 4.0 - 10.5 K/uL 5.4   5.3     Hemoglobin 12.0 - 15.0 g/dL 30.8   65.7     Hematocrit 36.0 - 46.0 % 36.4   37.9     Platelets 150 - 400 K/uL 195   155   258        Latest Ref Rng & Units 01/12/2022   11:07 AM 12/29/2021    9:54 AM 12/25/2021    8:12 AM  CMP  Glucose 70 -  99  mg/dL 161   096   93    BUN 8 - 23 mg/dL 7   10   9     Creatinine 0.44 - 1.00 mg/dL 0.45   4.09   8.11    Sodium 135 - 145 mmol/L 138   138   137    Potassium 3.5 - 5.1 mmol/L 3.5   4.4   3.6    Chloride 98 - 111 mmol/L 105   101   102    CO2 22 - 32 mmol/L 29   27   28     Calcium 8.9 - 10.3 mg/dL 8.4   8.5   7.9    Total Protein 6.5 - 8.1 g/dL 6.6   7.1   6.3    Total Bilirubin 0.3 - 1.2 mg/dL 0.3   0.6   0.5    Alkaline Phos 38 - 126 U/L 84   76   62    AST 15 - 41 U/L 29   43   46    ALT 0 - 44 U/L 19   34   33        Component Value Date/Time   RBC 3.90 03/04/2022 1353   MCV 93.3 03/04/2022 1353   MCV 92 12/09/2018 1308   MCH 29.2 03/04/2022 1353   MCHC 31.3 03/04/2022 1353   RDW 13.0 03/04/2022 1353   RDW 12.2 12/09/2018 1308   LYMPHSABS 1.9 12/29/2021 0954   LYMPHSABS 2.0 12/09/2018 1308   MONOABS 0.9 12/29/2021 0954   EOSABS 0.1 12/29/2021 0954   EOSABS 0.2 12/09/2018 1308   BASOSABS 0.1 12/29/2021 0954   BASOSABS 0.0 12/09/2018 1308    DIAGNOSTIC IMAGING:  I have independently reviewed the scans and discussed with the patient. No results found.   ASSESSMENT:  1.  Chronic ITP with exacerbations: Patient had thrombocytopenia since July 2016.  CT abdomen on 02/02/2018 showed normal-sized spleen.  Bone marrow aspiration and biopsy in 09/15/2017 showed hypercellular marrow for age, trilineage hematopoiesis with abundant megakaryocytes with no dysplasia. - Prednisone tapering dose over 5 weeks given on 12/14/2017 with no improvement in platelet count - IVIG (1 g/kg) on 12/23/2017 and 12/24/2017 with peak platelet count of 92, response lasting about 3 weeks -Dexamethasone 40 mg for 4 days on 01/11/2018 with improvement in platelet count of 61. - IVIG 1 g/kg on 12/18/2021 and 12/19/2021 without response. - Nplate 2 mcg/kg on 12/22/2021. - Promacta 25 mg daily started on 12/26/2021.     2.  Right middle lobe lung nodule: -CT of the chest on 08/08/2018 showed right middle lobe lung  nodule with one-year follow-up recommended.    PLAN:  1.  Chronic ITP with exacerbations: - She is taking Promacta 25 mg daily.  She has some bruising on forearms with no active bleeding. - She denies any GI side effects.  Labs show normal LFTs and creatinine. - Reviewed labs from today which showed platelet count 195.  Continue Promacta 25 mg daily. - RTC 6 weeks for follow-up.  We will consider Promacta every other day if platelet count is above 250.   2.  Right middle lobe lung nodule: - She had multiple lung nodules and CT scan from September 2019. - She is continuing to smoke 1 pack/day. - I have recommended follow-up CT of the chest without contrast prior to next visit.  3.  Health maintenance: - Mammogram on 12/29/2021 was BI-RADS Category 1.  Orders placed this encounter:  No orders of  the defined types were placed in this encounter.    Doreatha Massed, MD [facility] [phone]   I, Alda Ponder, am acting as a scribe for Dr. Doreatha Massed.  I, Doreatha Massed MD, have reviewed the above documentation for accuracy and completeness, and I agree with the above.

## 2022-03-04 NOTE — Patient Instructions (Signed)
[facility] ?Discharge Instructions ? ?You were seen and examined today by Dr. Ellin Saba. ? ?Dr. Ellin Saba discussed your most recent lab work and everything looks okay.  ? ?We will schedule you for a follow up CT scan since your last scan showed some spots on your lungs. ? ?Please follow-up as scheduled in 6 weeks.  ? ? ?Thank you for choosing [facility] at [facility] to provide your oncology and hematology care.  To afford each patient quality time with our provider, please arrive at least 15 minutes before your scheduled appointment time.  ? ?If you have a lab appointment with the Cancer Center please come in thru the Main Entrance and check in at the main information desk. ? ?You need to re-schedule your appointment should you arrive 10 or more minutes late.  We strive to give you quality time with our providers, and arriving late affects you and other patients whose appointments are after yours.  Also, if you no show three or more times for appointments you may be dismissed from the clinic at the providers discretion.     ?Again, thank you for choosing [facility].  Our hope is that these requests will decrease the amount of time that you wait before being seen by our physicians.       ?_____________________________________________________________ ? ?Should you have questions after your visit to [facility], please contact our office at [phone] and follow the prompts.  Our office hours are 8:00 a.m. and 4:30 p.m. Monday - Friday.  Please note that voicemails left after 4:00 p.m. may not be returned until the following business day.  We are closed weekends and major holidays.  You do have access to a nurse 24-7, just call the main number to the clinic [phone] and do not press any options, hold on the line and a nurse will answer the phone.   ? ?For prescription refill requests, have your pharmacy contact our  office and allow 72 hours.   ? ?Due to Covid, you will need to wear a mask upon entering the hospital. If you do not have a mask, a mask will be given to you at the Main Entrance upon arrival. For doctor visits, patients may have 1 support person age 30 or older with them. For treatment visits, patients can not have anyone with them due to social distancing guidelines and our immunocompromised population.  ? ?  ?

## 2022-03-13 ENCOUNTER — Ambulatory Visit (INDEPENDENT_AMBULATORY_CARE_PROVIDER_SITE_OTHER): Payer: HMO

## 2022-03-13 DIAGNOSIS — I442 Atrioventricular block, complete: Secondary | ICD-10-CM

## 2022-03-13 LAB — CUP PACEART REMOTE DEVICE CHECK
Battery Remaining Longevity: 31 mo
Battery Voltage: 2.92 V
Brady Statistic AP VP Percent: 0.62 %
Brady Statistic AP VS Percent: 0 %
Brady Statistic AS VP Percent: 98.53 %
Brady Statistic AS VS Percent: 0.85 %
Brady Statistic RA Percent Paced: 0.7 %
Brady Statistic RV Percent Paced: 99.15 %
Date Time Interrogation Session: 20230427190510
Implantable Lead Implant Date: 20200131
Implantable Lead Implant Date: 20200131
Implantable Lead Location: 753859
Implantable Lead Location: 753860
Implantable Lead Model: 3830
Implantable Lead Model: 5076
Implantable Pulse Generator Implant Date: 20200131
Lead Channel Impedance Value: 285 Ohm
Lead Channel Impedance Value: 361 Ohm
Lead Channel Impedance Value: 437 Ohm
Lead Channel Impedance Value: 437 Ohm
Lead Channel Pacing Threshold Amplitude: 0.375 V
Lead Channel Pacing Threshold Amplitude: 1.5 V
Lead Channel Pacing Threshold Pulse Width: 0.4 ms
Lead Channel Pacing Threshold Pulse Width: 0.4 ms
Lead Channel Sensing Intrinsic Amplitude: 2.125 mV
Lead Channel Sensing Intrinsic Amplitude: 2.125 mV
Lead Channel Sensing Intrinsic Amplitude: 5.875 mV
Lead Channel Sensing Intrinsic Amplitude: 5.875 mV
Lead Channel Setting Pacing Amplitude: 1.5 V
Lead Channel Setting Pacing Amplitude: 2.5 V
Lead Channel Setting Pacing Pulse Width: 0.5 ms
Lead Channel Setting Sensing Sensitivity: 4 mV

## 2022-03-26 DIAGNOSIS — R519 Headache, unspecified: Secondary | ICD-10-CM | POA: Diagnosis not present

## 2022-03-26 DIAGNOSIS — H9192 Unspecified hearing loss, left ear: Secondary | ICD-10-CM | POA: Diagnosis not present

## 2022-03-26 DIAGNOSIS — M542 Cervicalgia: Secondary | ICD-10-CM | POA: Diagnosis not present

## 2022-03-27 NOTE — Progress Notes (Signed)
Remote pacemaker transmission.   

## 2022-03-29 DIAGNOSIS — E1043 Type 1 diabetes mellitus with diabetic autonomic (poly)neuropathy: Secondary | ICD-10-CM | POA: Diagnosis not present

## 2022-03-29 DIAGNOSIS — Z794 Long term (current) use of insulin: Secondary | ICD-10-CM | POA: Diagnosis not present

## 2022-03-29 DIAGNOSIS — E1065 Type 1 diabetes mellitus with hyperglycemia: Secondary | ICD-10-CM | POA: Diagnosis not present

## 2022-04-06 ENCOUNTER — Other Ambulatory Visit: Payer: Self-pay | Admitting: Physician Assistant

## 2022-04-06 DIAGNOSIS — R519 Headache, unspecified: Secondary | ICD-10-CM

## 2022-04-06 DIAGNOSIS — M542 Cervicalgia: Secondary | ICD-10-CM

## 2022-04-06 DIAGNOSIS — H9192 Unspecified hearing loss, left ear: Secondary | ICD-10-CM

## 2022-04-08 DIAGNOSIS — E1042 Type 1 diabetes mellitus with diabetic polyneuropathy: Secondary | ICD-10-CM | POA: Diagnosis not present

## 2022-04-08 DIAGNOSIS — B351 Tinea unguium: Secondary | ICD-10-CM | POA: Diagnosis not present

## 2022-04-15 ENCOUNTER — Ambulatory Visit (HOSPITAL_COMMUNITY): Payer: HMO | Admitting: Hematology

## 2022-04-16 ENCOUNTER — Ambulatory Visit (HOSPITAL_COMMUNITY)
Admission: RE | Admit: 2022-04-16 | Discharge: 2022-04-16 | Disposition: A | Discharge status: Home / Self Care | Payer: HMO | Source: Ambulatory Visit | Attending: Hematology | Admitting: Hematology

## 2022-04-16 ENCOUNTER — Inpatient Hospital Stay (HOSPITAL_COMMUNITY): Payer: HMO | Attending: Hematology

## 2022-04-16 DIAGNOSIS — D696 Thrombocytopenia, unspecified: Secondary | ICD-10-CM | POA: Insufficient documentation

## 2022-04-16 DIAGNOSIS — Z043 Encounter for examination and observation following other accident: Secondary | ICD-10-CM | POA: Diagnosis not present

## 2022-04-16 DIAGNOSIS — I6782 Cerebral ischemia: Secondary | ICD-10-CM | POA: Insufficient documentation

## 2022-04-16 DIAGNOSIS — R918 Other nonspecific abnormal finding of lung field: Secondary | ICD-10-CM | POA: Insufficient documentation

## 2022-04-16 DIAGNOSIS — J984 Other disorders of lung: Secondary | ICD-10-CM | POA: Diagnosis not present

## 2022-04-16 DIAGNOSIS — I6381 Other cerebral infarction due to occlusion or stenosis of small artery: Secondary | ICD-10-CM | POA: Diagnosis not present

## 2022-04-16 DIAGNOSIS — M542 Cervicalgia: Secondary | ICD-10-CM | POA: Diagnosis not present

## 2022-04-16 DIAGNOSIS — M4802 Spinal stenosis, cervical region: Secondary | ICD-10-CM | POA: Diagnosis not present

## 2022-04-16 DIAGNOSIS — H9192 Unspecified hearing loss, left ear: Secondary | ICD-10-CM | POA: Insufficient documentation

## 2022-04-16 DIAGNOSIS — I6529 Occlusion and stenosis of unspecified carotid artery: Secondary | ICD-10-CM | POA: Diagnosis not present

## 2022-04-16 DIAGNOSIS — I2510 Atherosclerotic heart disease of native coronary artery without angina pectoris: Secondary | ICD-10-CM | POA: Diagnosis not present

## 2022-04-16 DIAGNOSIS — R519 Headache, unspecified: Secondary | ICD-10-CM | POA: Diagnosis not present

## 2022-04-16 DIAGNOSIS — D693 Immune thrombocytopenic purpura: Secondary | ICD-10-CM

## 2022-04-16 DIAGNOSIS — I700 Atherosclerosis of aorta: Secondary | ICD-10-CM | POA: Insufficient documentation

## 2022-04-16 DIAGNOSIS — R911 Solitary pulmonary nodule: Secondary | ICD-10-CM | POA: Diagnosis not present

## 2022-04-16 LAB — CBC WITH DIFFERENTIAL/PLATELET
Abs Immature Granulocytes: 0.01 10*3/uL (ref 0.00–0.07)
Basophils Absolute: 0 10*3/uL (ref 0.0–0.1)
Basophils Relative: 1 %
Eosinophils Absolute: 0.1 10*3/uL (ref 0.0–0.5)
Eosinophils Relative: 2 %
HCT: 36.6 % (ref 36.0–46.0)
Hemoglobin: 11.9 g/dL — ABNORMAL LOW (ref 12.0–15.0)
Immature Granulocytes: 0 %
Lymphocytes Relative: 31 %
Lymphs Abs: 1.7 10*3/uL (ref 0.7–4.0)
MCH: 29.8 pg (ref 26.0–34.0)
MCHC: 32.5 g/dL (ref 30.0–36.0)
MCV: 91.7 fL (ref 80.0–100.0)
Monocytes Absolute: 0.4 10*3/uL (ref 0.1–1.0)
Monocytes Relative: 8 %
Neutro Abs: 3.3 10*3/uL (ref 1.7–7.7)
Neutrophils Relative %: 58 %
Platelets: 210 10*3/uL (ref 150–400)
RBC: 3.99 MIL/uL (ref 3.87–5.11)
RDW: 13.1 % (ref 11.5–15.5)
WBC: 5.6 10*3/uL (ref 4.0–10.5)
nRBC: 0 % (ref 0.0–0.2)

## 2022-04-16 LAB — COMPREHENSIVE METABOLIC PANEL
ALT: 45 U/L — ABNORMAL HIGH (ref 0–44)
AST: 108 U/L — ABNORMAL HIGH (ref 15–41)
Albumin: 3.6 g/dL (ref 3.5–5.0)
Alkaline Phosphatase: 84 U/L (ref 38–126)
Anion gap: 5 (ref 5–15)
BUN: 10 mg/dL (ref 8–23)
CO2: 28 mmol/L (ref 22–32)
Calcium: 8.4 mg/dL — ABNORMAL LOW (ref 8.9–10.3)
Chloride: 109 mmol/L (ref 98–111)
Creatinine, Ser: 0.55 mg/dL (ref 0.44–1.00)
GFR, Estimated: >60 mL/min (ref >60)
Glucose, Bld: 199 mg/dL — ABNORMAL HIGH (ref 70–99)
Potassium: 3.9 mmol/L (ref 3.5–5.1)
Sodium: 142 mmol/L (ref 135–145)
Total Bilirubin: 0.7 mg/dL (ref 0.3–1.2)
Total Protein: 5.9 g/dL — ABNORMAL LOW (ref 6.5–8.1)

## 2022-04-22 ENCOUNTER — Inpatient Hospital Stay (HOSPITAL_COMMUNITY): Payer: HMO | Admitting: Hematology

## 2022-04-22 VITALS — BP 132/65 | HR 92 | Temp 97.6°F | Resp 16 | Ht 62.0 in | Wt 115.4 lb

## 2022-04-22 DIAGNOSIS — D693 Immune thrombocytopenic purpura: Secondary | ICD-10-CM | POA: Insufficient documentation

## 2022-04-22 NOTE — Progress Notes (Signed)
[facility] [address], [place] [address]   CLINIC:  Medical Oncology/Hematology  PCP:  Marguarite Arbour, MD [address] [facility] [place] / [place] [phone]  REASON FOR VISIT:  Follow-up for chronic ITP with exacerbations  PRIOR THERAPY: none  CURRENT THERAPY: surveillance  INTERVAL HISTORY:  Madeline [name], a 88 y.o. female, returns for routine follow-up for her chronic ITP with exacerbations. Madeline [name] was last seen on 03/04/2022.  Today Madeline [name] reports feeling good. Madeline [name] denies bleeding issues and bruising. Madeline [name] continues to take 1 tablet of Promacta daily as prescribed. Madeline [name] continues to smoke 1 ppd. Madeline [name] denies n/v/d/c. Madeline [name] reports 1 fall in May, and Madeline [name] denies any new pains following this event.    REVIEW OF SYSTEMS:  Review of Systems  Constitutional:  Negative for appetite change and fatigue.  HENT:   Negative for nosebleeds.   Respiratory:  Negative for hemoptysis.   Gastrointestinal:  Negative for blood in stool, constipation, diarrhea, nausea and vomiting.  Genitourinary:  Negative for hematuria.   Hematological:  Does not bruise/bleed easily.  All other systems reviewed and are negative.  PAST MEDICAL/SURGICAL HISTORY:  Past Medical History:  Diagnosis Date   Bradycardia    Coronary artery disease    Diabetes mellitus    Dyslipidemia    GERD (gastroesophageal reflux disease)    Hypothyroid    Presence of permanent cardiac pacemaker 12/16/2018   Medtronic dual lead pacemaker   Past Surgical History:  Procedure Laterality Date   APPENDECTOMY     BREAST BIOPSY Right 1967   EXCISIONAL - NEG   CHOLECYSTECTOMY     CORONARY STENT INTERVENTION     INSERT / REPLACE / REMOVE PACEMAKER  12/16/2018   no surgical hx     PACEMAKER IMPLANT N/A 12/16/2018   Procedure: PACEMAKER IMPLANT;  Surgeon: Marinus Maw, MD;  Location: [facility];  Service: Cardiovascular;  Laterality: N/A;    SOCIAL HISTORY:   Social History   Socioeconomic History   Marital status: Married    Spouse name: Not on file   Number of children: Not on file   Years of education: Not on file   Highest education level: Not on file  Occupational History   Not on file  Tobacco Use   Smoking status: Every Day    Packs/day: 1.00    Years: 50.00    Pack years: 50.00    Types: Cigarettes   Smokeless tobacco: Never  Vaping Use   Vaping Use: Never used  Substance and Sexual Activity   Alcohol use: No   Drug use: No   Sexual activity: Not on file  Other Topics Concern   Not on file  Social History Narrative   Not on file   Social Determinants of Health   Financial Resource Strain: Not on file  Food Insecurity: Not on file  Transportation Needs: Not on file  Physical Activity: Not on file  Stress: Not on file  Social Connections: Not on file  Intimate Partner Violence: Not on file    FAMILY HISTORY:  Family History  Problem Relation Age of Onset   Heart Problems Mother    Other Father        UNK   Cancer Sister        BREAST   Breast cancer Sister 86   Cancer Sister        BREAST   Breast cancer Sister 56  Cancer Sister        BREAST   Diabetes Son     CURRENT MEDICATIONS:  Current Outpatient Medications  Medication Sig Dispense Refill   acetaminophen (TYLENOL) 325 MG tablet Take 1-2 tablets (325-650 mg total) by mouth every 6 (six) hours as needed for mild pain or moderate pain.     atorvastatin (LIPITOR) 20 MG tablet Take 1 tablet (20 mg total) by mouth daily at 6 PM. 90 tablet 3   cetirizine (ZYRTEC) 10 MG tablet Take 10 mg by mouth daily.     citalopram (CELEXA) 20 MG tablet Take 20 mg by mouth daily.     CONTOUR NEXT TEST test strip USE 1 STRIP TO CHECK BLOOD SUGAR EIGHT TO NINE TIMES DAILY.     eltrombopag (PROMACTA) 25 MG tablet Take 1 tablet (25 mg total) by mouth daily. Take on an empty stomach, 1 hour before a meal or 2 hours after. 30 tablet 3   Insulin Human (INSULIN PUMP) SOLN  Inject 1 each into the skin continuous. Used with Novolog insulin     Lancets 28G MISC Use as instructed. 7-8 times daily.     levothyroxine (SYNTHROID, LEVOTHROID) 50 MCG tablet Take 50 mcg by mouth daily before breakfast.   3   lisinopril (PRINIVIL,ZESTRIL) 20 MG tablet Take 1 tablet (20 mg total) by mouth daily. 30 tablet 1   Multiple Vitamin (MULTIVITAMIN WITH MINERALS) TABS tablet Take 1 tablet by mouth daily.     NOVOLOG 100 UNIT/ML injection Inject 0-50 Units into the skin continuous. Up to 50 units/ day via pump 10 mL 0   No current facility-administered medications for this visit.    ALLERGIES:  Allergies  Allergen Reactions   Darvon     "FEEL SICK"   Propoxyphene Nausea Only and Nausea And Vomiting   Sulfa Antibiotics Nausea Only and Nausea And Vomiting    "FEEL SICK"     PHYSICAL EXAM:  Performance status (ECOG): 1 - Symptomatic but completely ambulatory  There were no vitals filed for this visit. Wt Readings from Last 3 Encounters:  03/04/22 117 lb 11.2 oz (53.4 kg)  01/12/22 113 lb (51.3 kg)  12/25/21 115 lb 9.6 oz (52.4 kg)   Physical Exam Vitals reviewed.  Constitutional:      Appearance: Normal appearance.  Cardiovascular:     Rate and Rhythm: Normal rate and regular rhythm.     Pulses: Normal pulses.     Heart sounds: Normal heart sounds.  Pulmonary:     Effort: Pulmonary effort is normal.     Breath sounds: Normal breath sounds.  Neurological:     General: No focal deficit present.     Mental Status: Madeline [name] is alert and oriented to person, place, and time.  Psychiatric:        Mood and Affect: Mood normal.        Behavior: Behavior normal.    LABORATORY DATA:  I have reviewed the labs as listed.     Latest Ref Rng & Units 04/16/2022   12:47 PM 03/04/2022    1:53 PM 01/12/2022   11:07 AM  CBC  WBC 4.0 - 10.5 K/uL 5.6   5.4   5.3    Hemoglobin 12.0 - 15.0 g/dL 16.1   09.6   04.5    Hematocrit 36.0 - 46.0 % 36.6   36.4   37.9    Platelets 150 - 400  K/uL 210   195   155  Latest Ref Rng & Units 04/16/2022   12:47 PM 03/04/2022    1:53 PM 01/12/2022   11:07 AM  CMP  Glucose 70 - 99 mg/dL 045   409   811    BUN 8 - 23 mg/dL Creatinine 0.44 - 1.00 mg/dL 9.14   7.82   9.56    Sodium 135 - 145 mmol/L 142   143   138    Potassium 3.5 - 5.1 mmol/L 3.9   4.0   3.5    Chloride 98 - 111 mmol/L 109   108   105    CO2 22 - 32 mmol/L Calcium 8.9 - 10.3 mg/dL 8.4   8.3   8.4    Total Protein 6.5 - 8.1 g/dL 5.9   6.1   6.6    Total Bilirubin 0.3 - 1.2 mg/dL 0.7   0.3   0.3    Alkaline Phos 38 - 126 U/L 84   78   84    AST 15 - 41 U/L 108   20   29    ALT 0 - 44 U/L 45   16   19        Component Value Date/Time   RBC 3.99 04/16/2022 1247   MCV 91.7 04/16/2022 1247   MCV 92 12/09/2018 1308   MCH 29.8 04/16/2022 1247   MCHC 32.5 04/16/2022 1247   RDW 13.1 04/16/2022 1247   RDW 12.2 12/09/2018 1308   LYMPHSABS 1.7 04/16/2022 1247   LYMPHSABS 2.0 12/09/2018 1308   MONOABS 0.4 04/16/2022 1247   EOSABS 0.1 04/16/2022 1247   EOSABS 0.2 12/09/2018 1308   BASOSABS 0.0 04/16/2022 1247   BASOSABS 0.0 12/09/2018 1308    DIAGNOSTIC IMAGING:  I have independently reviewed the scans and discussed with the patient. CT HEAD WO CONTRAST ( )  Result Date: 04/17/2022 CLINICAL DATA:  88 year old female status post fall on 04/13/2002. Thrombocytopenia. EXAM: CT HEAD WITHOUT CONTRAST TECHNIQUE: Contiguous axial images were obtained from the base of the skull through the vertex without intravenous contrast. RADIATION DOSE REDUCTION: This exam was performed according to the departmental dose-optimization program which includes automated exposure control, adjustment of the mA and/or kV according to patient size and/or use of iterative reconstruction technique. COMPARISON:  Brain MRI 11/11/2010.  Head CT 12/15/2021. FINDINGS: Brain: Stable cerebral volume. Chronic appearing hypodensity in the right basal ganglia and/or lateral  thalamus (series 2, image 13) appears stable since January. Chronic lacunar infarct left caudate appears stable. And there is a small focus of chronic appearing encephalomalacia at the right occipital pole (series 5, image 26). No midline shift, ventriculomegaly, mass effect, evidence of mass lesion, intracranial hemorrhage or evidence of cortically based acute infarction. Vascular: Calcified atherosclerosis at the skull base. No suspicious intracranial vascular hyperdensity. Skull: No acute osseous abnormality identified. Sinuses/Orbits: Visualized paranasal sinuses and mastoids are stable and well aerated. Other: Visualized orbits and scalp soft tissues are within normal limits. IMPRESSION: 1. No acute intracranial abnormality or recent traumatic injury identified. 2. Stable non contrast CT appearance of chronic ischemia since January. Electronically Signed   By: Odessa Fleming M.D.   On: 04/17/2022 11:14   CT Chest Wo Contrast  Result Date: 04/17/2022 CLINICAL DATA:  88 year old female status post fall on 04/13/2002. Thrombocytopenia. Chronic lung nodules. EXAM: CT CHEST WITHOUT CONTRAST TECHNIQUE: Multidetector CT imaging of the chest was performed  following the standard protocol without IV contrast. RADIATION DOSE REDUCTION: This exam was performed according to the departmental dose-optimization program which includes automated exposure control, adjustment of the mA and/or kV according to patient size and/or use of iterative reconstruction technique. COMPARISON:  Chest CT without contrast 08/08/2018. FINDINGS: Cardiovascular: Left chest cardiac pacemaker is new since 2019. Left subclavian transvenous leads. Extensive Calcified aortic atherosclerosis. Extensive calcified coronary artery atherosclerosis and/or stents. Borderline to mild cardiomegaly. No pericardial effusion. Vascular patency is not evaluated in the absence of IV contrast. Mediastinum/Nodes: Negative. No mediastinal mass or lymphadenopathy evident in  the absence of contrast. Lungs/Pleura: Mildly lower lung volumes compared to 2019. The glottis is closed. Major airways remain patent. Mild generalized increased pulmonary interstitial markings compared to 2019, may be in part related to lower lung volumes with crowding. Areas of chronic subpleural scarring superimposed, most pronounced in the lung apices, but also chronically affecting the right middle lobe (stable on series 4, image 74) and lingula (stable on image 95). No pleural effusion. Small areas of nodular thickening along the left major fissure are also unchanged and benign (series 4, image 67). No increased or suspicious pulmonary nodule. No pleural effusion. Upper Abdomen: Chronically absent gallbladder. Negative visible noncontrast liver, spleen, pancreas, adrenal glands, left kidney, and bowel in the upper abdomen. No splenomegaly. No upper abdominal free fluid. Musculoskeletal: Osteopenia. Chronic upper thoracic ankylosis T3 through T5. Nondisplaced fracture of the right lateral 9th rib on series 4, image 119 is new since 2019 but appears subacute or chronic with some healing. No other new rib fracture identified. IMPRESSION: 1. Nondisplaced right lateral 9th rib fracture is new since 2019 and appears subacute or chronic with partial healing. No other acute osseous abnormality or other recent traumatic injury identified in the noncontrast Chest. 2. Chronic lung disease. Benign pulmonary nodules with no acute or suspicious pulmonary abnormality. 3. Calcified coronary artery atherosclerosis. Aortic Atherosclerosis (ICD10-I70.0). Electronically Signed   By: Odessa FlemingH  Hall M.D.   On: 04/17/2022 11:20   CT CERVICAL SPINE WO CONTRAST  Result Date: 04/17/2022 CLINICAL DATA:  88 year old female status post fall on 04/13/2002. Thrombocytopenia. EXAM: CT CERVICAL SPINE WITHOUT CONTRAST TECHNIQUE: Multidetector CT imaging of the cervical spine was performed without intravenous contrast. Multiplanar CT image  reconstructions were also generated. RADIATION DOSE REDUCTION: This exam was performed according to the departmental dose-optimization program which includes automated exposure control, adjustment of the mA and/or kV according to patient size and/or use of iterative reconstruction technique. COMPARISON:  Head CT today and chest CT today reported separately. Cervical spine radiographs 07/13/2008. FINDINGS: Alignment: Chronic straightening and mild reversal of cervical lordosis does not appear significantly changed from 2009. Bilateral posterior element alignment is within normal limits. Skull base and vertebrae: Visualized skull base is intact. No atlanto-occipital dissociation. C1 and C2 appear intact and aligned. No acute osseous abnormality identified. Soft tissues and spinal canal: No prevertebral fluid or swelling. No visible canal hematoma. Calcified carotid atherosclerosis in the neck. Negative visible other noncontrast neck soft tissues. Disc levels: Widespread severe disc and endplate degeneration in the cervical spine. However, fairly capacious underlying spinal canal. Mild if any associated spinal stenosis (C3-C4). Intermittent facet arthropathy, most pronounced at the left cervicothoracic junction with vacuum facet there. Upper chest: Chest CT today reported separately. IMPRESSION: 1. No acute traumatic injury identified in the cervical spine. 2. Advanced chronic cervical spine degeneration, but mild if any associated spinal stenosis. Electronically Signed   By: Odessa FlemingH  Hall M.D.   On: 04/17/2022  11:30     ASSESSMENT:  1.  Chronic ITP with exacerbations: Patient had thrombocytopenia since July 2016.  CT abdomen on 02/02/2018 showed normal-sized spleen.  Bone marrow aspiration and biopsy in 09/15/2017 showed hypercellular marrow for age, trilineage hematopoiesis with abundant megakaryocytes with no dysplasia. - Prednisone tapering dose over 5 weeks given on 12/14/2017 with no improvement in platelet count -  IVIG (1 g/kg) on 12/23/2017 and 12/24/2017 with peak platelet count of 92, response lasting about 3 weeks -Dexamethasone 40 mg for 4 days on 01/11/2018 with improvement in platelet count of 61. - IVIG 1 g/kg on 12/18/2021 and 12/19/2021 without response. - Nplate 2 mcg/kg on 12/22/2021. - Promacta 25 mg daily started on 12/26/2021.     2.  Right middle lobe lung nodule: -CT of the chest on 08/08/2018 showed right middle lobe lung nodule with one-year follow-up recommended.    PLAN:  1.  Chronic ITP with exacerbations: - Madeline [name] is taking Promacta 25 mg daily with no GI side effects. - Reviewed labs today which showed elevated AST of 108 and ALT of 45.  Likely from Promacta as Madeline [name] is not on any other new medications. - Platelet count is 210.  Continue Promacta 25 mg daily.  RTC 6 weeks with repeat labs.   2.  Right middle lobe lung nodule: - CT chest without contrast on 04/16/2022 reviewed by me shows benign lung nodules with no abnormalities.  Nondisplaced right lateral ninth rib fracture.  Upon questioning Madeline [name] reports that Madeline [name] fell last week in the rain.  Madeline [name] does not report any pain at the rib site.  I did not recommend any further scans.  3.  Health maintenance: - Mammogram on 12/29/2021 was BI-RADS Category 1.  Orders placed this encounter:  No orders of the defined types were placed in this encounter.    Doreatha Massed, MD [facility] [phone]   I, Alda Ponder, am acting as a scribe for Dr. Doreatha Massed.  I, Doreatha Massed MD, have reviewed the above documentation for accuracy and completeness, and I agree with the above.

## 2022-04-22 NOTE — Patient Instructions (Addendum)
[facility] at [facility] Discharge Instructions  You were seen and examined today by Dr. Ellin Saba.  Dr. Ellin Saba discussed your most recent lab work and CT scan which revealed that you have a fractured rib and that your liver function test are slightly high. Everything else looks good. We will recheck labs at next appointment to keep a watch on your liver functions.  Continue taking Promacta 25 mg as prescribed.   Follow-up as scheduled in 6 weeks.    Thank you for choosing  Cancer Center at [facility] to provide your oncology and hematology care.  To afford each patient quality time with our provider, please arrive at least 15 minutes before your scheduled appointment time.   If you have a lab appointment with the Cancer Center please come in thru the Main Entrance and check in at the main information desk.  You need to re-schedule your appointment should you arrive 10 or more minutes late.  We strive to give you quality time with our providers, and arriving late affects you and other patients whose appointments are after yours.  Also, if you no show three or more times for appointments you may be dismissed from the clinic at the providers discretion.     Again, thank you for choosing [facility].  Our hope is that these requests will decrease the amount of time that you wait before being seen by our physicians.       _____________________________________________________________  Should you have questions after your visit to [facility], please contact our office at [phone] and follow the prompts.  Our office hours are 8:00 a.m. and 4:30 p.m. Monday - Friday.  Please note that voicemails left after 4:00 p.m. may not be returned until the following business day.  We are closed weekends and major holidays.  You do have access to a nurse 24-7, just call the main number to the clinic [phone] and do not press any  options, hold on the line and a nurse will answer the phone.    For prescription refill requests, have your pharmacy contact our office and allow 72 hours.    Due to Covid, you will need to wear a mask upon entering the hospital. If you do not have a mask, a mask will be given to you at the Main Entrance upon arrival. For doctor visits, patients may have 1 support person age 86 or older with them. For treatment visits, patients can not have anyone with them due to social distancing guidelines and our immunocompromised population.

## 2022-04-29 DIAGNOSIS — E1065 Type 1 diabetes mellitus with hyperglycemia: Secondary | ICD-10-CM | POA: Diagnosis not present

## 2022-04-29 DIAGNOSIS — Z794 Long term (current) use of insulin: Secondary | ICD-10-CM | POA: Diagnosis not present

## 2022-04-29 DIAGNOSIS — E1043 Type 1 diabetes mellitus with diabetic autonomic (poly)neuropathy: Secondary | ICD-10-CM | POA: Diagnosis not present

## 2022-05-13 DIAGNOSIS — E1065 Type 1 diabetes mellitus with hyperglycemia: Secondary | ICD-10-CM | POA: Diagnosis not present

## 2022-05-25 DIAGNOSIS — S80212A Abrasion, left knee, initial encounter: Secondary | ICD-10-CM | POA: Diagnosis not present

## 2022-05-25 DIAGNOSIS — S8002XA Contusion of left knee, initial encounter: Secondary | ICD-10-CM | POA: Diagnosis not present

## 2022-05-25 DIAGNOSIS — M25562 Pain in left knee: Secondary | ICD-10-CM | POA: Diagnosis not present

## 2022-05-27 ENCOUNTER — Encounter (HOSPITAL_COMMUNITY): Payer: Self-pay | Admitting: Internal Medicine

## 2022-05-30 DIAGNOSIS — E1043 Type 1 diabetes mellitus with diabetic autonomic (poly)neuropathy: Secondary | ICD-10-CM | POA: Diagnosis not present

## 2022-05-30 DIAGNOSIS — E1065 Type 1 diabetes mellitus with hyperglycemia: Secondary | ICD-10-CM | POA: Diagnosis not present

## 2022-05-30 DIAGNOSIS — Z794 Long term (current) use of insulin: Secondary | ICD-10-CM | POA: Diagnosis not present

## 2022-06-03 ENCOUNTER — Inpatient Hospital Stay (HOSPITAL_COMMUNITY): Payer: HMO

## 2022-06-03 ENCOUNTER — Inpatient Hospital Stay (HOSPITAL_COMMUNITY): Payer: HMO | Attending: Hematology | Admitting: Hematology

## 2022-06-03 VITALS — BP 161/72 | HR 78 | Temp 98.2°F | Resp 17 | Ht 62.0 in | Wt 109.3 lb

## 2022-06-03 DIAGNOSIS — F17210 Nicotine dependence, cigarettes, uncomplicated: Secondary | ICD-10-CM | POA: Diagnosis not present

## 2022-06-03 DIAGNOSIS — D693 Immune thrombocytopenic purpura: Secondary | ICD-10-CM | POA: Diagnosis not present

## 2022-06-03 DIAGNOSIS — R911 Solitary pulmonary nodule: Secondary | ICD-10-CM | POA: Diagnosis not present

## 2022-06-03 DIAGNOSIS — Z803 Family history of malignant neoplasm of breast: Secondary | ICD-10-CM | POA: Diagnosis not present

## 2022-06-03 LAB — CBC WITH DIFFERENTIAL/PLATELET
Abs Immature Granulocytes: 0.02 10*3/uL (ref 0.00–0.07)
Basophils Absolute: 0.1 10*3/uL (ref 0.0–0.1)
Basophils Relative: 1 %
Eosinophils Absolute: 0.1 10*3/uL (ref 0.0–0.5)
Eosinophils Relative: 2 %
HCT: 38.2 % (ref 36.0–46.0)
Hemoglobin: 12.4 g/dL (ref 12.0–15.0)
Immature Granulocytes: 0 %
Lymphocytes Relative: 31 %
Lymphs Abs: 2.3 10*3/uL (ref 0.7–4.0)
MCH: 29.9 pg (ref 26.0–34.0)
MCHC: 32.5 g/dL (ref 30.0–36.0)
MCV: 92 fL (ref 80.0–100.0)
Monocytes Absolute: 0.7 10*3/uL (ref 0.1–1.0)
Monocytes Relative: 9 %
Neutro Abs: 4.2 10*3/uL (ref 1.7–7.7)
Neutrophils Relative %: 57 %
Platelets: 242 10*3/uL (ref 150–400)
RBC: 4.15 MIL/uL (ref 3.87–5.11)
RDW: 14.1 % (ref 11.5–15.5)
WBC: 7.4 10*3/uL (ref 4.0–10.5)
nRBC: 0 % (ref 0.0–0.2)

## 2022-06-03 LAB — COMPREHENSIVE METABOLIC PANEL
ALT: 18 U/L (ref 0–44)
AST: 26 U/L (ref 15–41)
Albumin: 3.6 g/dL (ref 3.5–5.0)
Alkaline Phosphatase: 122 U/L (ref 38–126)
Anion gap: 8 (ref 5–15)
BUN: 9 mg/dL (ref 8–23)
CO2: 28 mmol/L (ref 22–32)
Calcium: 8.9 mg/dL (ref 8.9–10.3)
Chloride: 106 mmol/L (ref 98–111)
Creatinine, Ser: 0.6 mg/dL (ref 0.44–1.00)
GFR, Estimated: >60 mL/min (ref >60)
Glucose, Bld: 85 mg/dL (ref 70–99)
Potassium: 3.9 mmol/L (ref 3.5–5.1)
Sodium: 142 mmol/L (ref 135–145)
Total Bilirubin: 0.6 mg/dL (ref 0.3–1.2)
Total Protein: 6.6 g/dL (ref 6.5–8.1)

## 2022-06-03 NOTE — Patient Instructions (Addendum)
[facility] at [facility] Discharge Instructions  You were seen and examined today by Dr. Ellin Saba.  Dr. Ellin Saba discussed your most recent lab work and everything looks good.  Follow-up as scheduled in 12 weeks.    Thank you for choosing [facility] at [facility] to provide your oncology and hematology care.  To afford each patient quality time with our provider, please arrive at least 15 minutes before your scheduled appointment time.   If you have a lab appointment with the Cancer Center please come in thru the Main Entrance and check in at the main information desk.  You need to re-schedule your appointment should you arrive 10 or more minutes late.  We strive to give you quality time with our providers, and arriving late affects you and other patients whose appointments are after yours.  Also, if you no show three or more times for appointments you may be dismissed from the clinic at the providers discretion.     Again, thank you for choosing [facility].  Our hope is that these requests will decrease the amount of time that you wait before being seen by our physicians.       _____________________________________________________________  Should you have questions after your visit to [facility], please contact our office at [phone] and follow the prompts.  Our office hours are 8:00 a.m. and 4:30 p.m. Monday - Friday.  Please note that voicemails left after 4:00 p.m. may not be returned until the following business day.  We are closed weekends and major holidays.  You do have access to a nurse 24-7, just call the main number to the clinic [phone] and do not press any options, hold on the line and a nurse will answer the phone.    For prescription refill requests, have your pharmacy contact our office and allow 72 hours.    Due to Covid, you will need to wear a mask upon entering the hospital. If you do  not have a mask, a mask will be given to you at the Main Entrance upon arrival. For doctor visits, patients may have 1 support person age 99 or older with them. For treatment visits, patients can not have anyone with them due to social distancing guidelines and our immunocompromised population.

## 2022-06-03 NOTE — Progress Notes (Signed)
Madeline [name], [place] [address]   CLINIC:  Medical Oncology/Hematology  PCP:  Idelle Crouch, MD [place] / [place] [phone]  REASON FOR VISIT:  Follow-up for chronic ITP with exacerbations  PRIOR THERAPY: none  CURRENT THERAPY: surveillance  INTERVAL HISTORY:  Madeline [name], a 87 y.o. female, returns for routine follow-up for her chronic ITP with exacerbations. Madeline [name] was last seen on 04/22/2022.  Today she reports feeling good. She has lost 6 lbs since last visit. She is taking Promacta and reports tolerating it well. She denies n/v/d, bleeding issues, and infections. Her appetite is good.   REVIEW OF SYSTEMS:  Review of Systems  Constitutional:  Positive for unexpected weight change (-6 lbs). Negative for appetite change and fatigue.  HENT:   Negative for nosebleeds.   Respiratory:  Negative for hemoptysis.   Gastrointestinal:  Negative for diarrhea, nausea and vomiting.  Genitourinary:  Negative for hematuria and vaginal bleeding.   Hematological:  Does not bruise/bleed easily.  All other systems reviewed and are negative.   PAST MEDICAL/SURGICAL HISTORY:  Past Medical History:  Diagnosis Date   Bradycardia    Coronary artery disease    Diabetes mellitus    Dyslipidemia    GERD (gastroesophageal reflux disease)    Hypothyroid    Presence of permanent cardiac pacemaker 12/16/2018   Medtronic dual lead pacemaker   Past Surgical History:  Procedure Laterality Date   APPENDECTOMY     BREAST BIOPSY Right 1967   EXCISIONAL - NEG   CHOLECYSTECTOMY     CORONARY STENT INTERVENTION     INSERT / REPLACE / REMOVE PACEMAKER  12/16/2018   no surgical hx     PACEMAKER IMPLANT N/A 12/16/2018   Procedure: PACEMAKER IMPLANT;  Surgeon: Evans Lance, MD;  Location: [facility];  Service: Cardiovascular;  Laterality: N/A;    SOCIAL HISTORY:  Social History    Socioeconomic History   Marital status: Married    Spouse name: Not on file   Number of children: Not on file   Years of education: Not on file   Highest education level: Not on file  Occupational History   Not on file  Tobacco Use   Smoking status: Every Day    Packs/day: 1.00    Years: 50.00    Total pack years: 50.00    Types: Cigarettes   Smokeless tobacco: Never  Vaping Use   Vaping Use: Never used  Substance and Sexual Activity   Alcohol use: No   Drug use: No   Sexual activity: Not on file  Other Topics Concern   Not on file  Social History Narrative   Not on file   Social Determinants of Health   Financial Resource Strain: Not on file  Food Insecurity: No Food Insecurity (09/02/2020)   Hunger Vital Sign    Worried About Running Out of Food in the Last Year: Never true    Ran Out of Food in the Last Year: Never true  Transportation Needs: No Transportation Needs (09/02/2020)   PRAPARE - Hydrologist (Medical): No    Lack of Transportation (Non-Medical): No  Physical Activity: Not on file  Stress: Not on file  Social Connections: Not on file  Intimate Partner Violence: Not on file    FAMILY HISTORY:  Family History  Problem Relation Age of Onset   Heart Problems Mother  Other Father        UNK   Cancer Sister        BREAST   Breast cancer Sister 53   Cancer Sister        BREAST   Breast cancer Sister 61   Cancer Sister        BREAST   Diabetes Son     CURRENT MEDICATIONS:  Current Outpatient Medications  Medication Sig Dispense Refill   acetaminophen (TYLENOL) 325 MG tablet Take 1-2 tablets (325-650 mg total) by mouth every 6 (six) hours as needed for mild pain or moderate pain.     atorvastatin (LIPITOR) 20 MG tablet Take 1 tablet (20 mg total) by mouth daily at 6 PM. 90 tablet 3   cetirizine (ZYRTEC) 10 MG tablet Take 10 mg by mouth daily.     citalopram (CELEXA) 20 MG tablet Take 20 mg by mouth daily.      CONTOUR NEXT TEST test strip USE 1 STRIP TO CHECK BLOOD SUGAR EIGHT TO NINE TIMES DAILY.     eltrombopag (PROMACTA) 25 MG tablet Take 1 tablet (25 mg total) by mouth daily. Take on an empty stomach, 1 hour before a meal or 2 hours after. 30 tablet 3   gabapentin (NEURONTIN) 100 MG capsule Take 100 mg twice a day for two weeks, then increase to 200 mg(2 tablets) twice a day and continue     Insulin Human (INSULIN PUMP) SOLN Inject 1 each into the skin continuous. Used with Novolog insulin     Lancets 28G MISC Use as instructed. 7-8 times daily.     levothyroxine (SYNTHROID) 75 MCG tablet Take by mouth.     lisinopril (PRINIVIL,ZESTRIL) 20 MG tablet Take 1 tablet (20 mg total) by mouth daily. 30 tablet 1   Multiple Vitamin (MULTIVITAMIN WITH MINERALS) TABS tablet Take 1 tablet by mouth daily.     NOVOLOG 100 UNIT/ML injection Inject 0-50 Units into the skin continuous. Up to 50 units/ day via pump 10 mL 0   No current facility-administered medications for this visit.    ALLERGIES:  Allergies  Allergen Reactions   Darvon     "FEEL SICK"   Propoxyphene Nausea Only and Nausea And Vomiting   Sulfa Antibiotics Nausea Only and Nausea And Vomiting    "FEEL SICK"     PHYSICAL EXAM:  Performance status (ECOG): 1 - Symptomatic but completely ambulatory  There were no vitals filed for this visit. Wt Readings from Last 3 Encounters:  04/22/22 115 lb 6.4 oz (52.3 kg)  03/04/22 117 lb 11.2 oz (53.4 kg)  01/12/22 113 lb (51.3 kg)   Physical Exam Vitals reviewed.  Constitutional:      Appearance: Normal appearance.  Cardiovascular:     Rate and Rhythm: Normal rate and regular rhythm.     Pulses: Normal pulses.     Heart sounds: Normal heart sounds.  Pulmonary:     Effort: Pulmonary effort is normal.     Breath sounds: Normal breath sounds.  Neurological:     General: No focal deficit present.     Mental Status: She is alert and oriented to person, place, and time.  Psychiatric:         Mood and Affect: Mood normal.        Behavior: Behavior normal.     LABORATORY DATA:  I have reviewed the labs as listed.     Latest Ref Rng & Units 06/03/2022    2:14 PM 04/16/2022  12:47 PM 03/04/2022    1:53 PM  CBC  WBC 4.0 - 10.5 K/uL 7.4  5.6  5.4   Hemoglobin 12.0 - 15.0 g/dL 25.0  53.9  76.7   Hematocrit 36.0 - 46.0 % 38.2  36.6  36.4   Platelets 150 - 400 K/uL 242  210  195       Latest Ref Rng & Units 06/03/2022    2:14 PM 04/16/2022   12:47 PM 03/04/2022    1:53 PM  CMP  Glucose 70 - 99 mg/dL 85  341  937   BUN 8 - 23 mg/dL 9  10  13    Creatinine 0.44 - 1.00 mg/dL  9.02  4.09   Sodium 135 - 145 mmol/L 142  142  143   Potassium 3.5 - 5.1 mmol/L 3.9  3.9  4.0   Chloride 98 - 111 mmol/L 106  109  108   CO2 22 - 32 mmol/L 28  28  29    Calcium 8.9 - 10.3 mg/dL 8.9  8.4  8.3   Total Protein 6.5 - 8.1 g/dL 6.6  5.9  6.1   Total Bilirubin 0.3 - 1.2 mg/dL 0.6  0.7  0.3   Alkaline Phos 38 - 126 U/L 122  84  78   AST 15 - 41 U/L 26  108  20   ALT 0 - 44 U/L 18  45  16       Component Value Date/Time   RBC 4.15 06/03/2022 1414   MCV 92.0 06/03/2022 1414   MCV 92 12/09/2018 1308   MCH 29.9 06/03/2022 1414   MCHC 32.5 06/03/2022 1414   RDW 14.1 06/03/2022 1414   RDW 12.2 12/09/2018 1308   LYMPHSABS 2.3 06/03/2022 1414   LYMPHSABS 2.0 12/09/2018 1308   MONOABS 0.7 06/03/2022 1414   EOSABS 0.1 06/03/2022 1414   EOSABS 0.2 12/09/2018 1308   BASOSABS 0.1 06/03/2022 1414   BASOSABS 0.0 12/09/2018 1308    DIAGNOSTIC IMAGING:  I have independently reviewed the scans and discussed with the patient. No results found.   ASSESSMENT:  1.  Chronic ITP with exacerbations: Patient had thrombocytopenia since July 2016.  CT abdomen on 02/02/2018 showed normal-sized spleen.  Bone marrow aspiration and biopsy in 09/15/2017 showed hypercellular marrow for age, trilineage hematopoiesis with abundant megakaryocytes with no dysplasia. - Prednisone tapering dose over 5 weeks given on  12/14/2017 with no improvement in platelet count - IVIG (1 g/kg) on 12/23/2017 and 12/24/2017 with peak platelet count of 92, response lasting about 3 weeks -Dexamethasone 40 mg for 4 days on 01/11/2018 with improvement in platelet count of 61. - IVIG 1 g/kg on 12/18/2021 and 12/19/2021 without response. - Nplate 2 mcg/kg on 12/22/2021. - Promacta 25 mg daily started on 12/26/2021.     2.  Right middle lobe lung nodule: -CT of the chest on 08/08/2018 showed right middle lobe lung nodule with one-year follow-up recommended.    PLAN:  1.  Chronic ITP with exacerbations: - She is tolerating Promacta without any GI side effects. - Reviewed labs today which showed normalized LFTs and creatinine.  CBC was grossly normal with platelet count 242. - She lost about 6 pounds but reports appetite is good. - Continue Promacta 25 mg daily.  RTC 12 weeks for follow-up with repeat labs.   2.  Right middle lobe lung nodule: - CT chest on 04/16/2022 shows benign lung nodules with no abnormalities.  No further scans were recommended.  3.  Health maintenance: - Mammogram on 12/29/2021 was BI-RADS Category 1.  Orders placed this encounter:  No orders of the defined types were placed in this encounter.    Doreatha Massed, MD [facility] [phone]   I, Alda Ponder, am acting as a scribe for Dr. Doreatha Massed.  I, Doreatha Massed MD, have reviewed the above documentation for accuracy and completeness, and I agree with the above.

## 2022-06-04 DIAGNOSIS — R8290 Unspecified abnormal findings in urine: Secondary | ICD-10-CM | POA: Diagnosis not present

## 2022-06-04 DIAGNOSIS — E7800 Pure hypercholesterolemia, unspecified: Secondary | ICD-10-CM | POA: Diagnosis not present

## 2022-06-04 DIAGNOSIS — E1043 Type 1 diabetes mellitus with diabetic autonomic (poly)neuropathy: Secondary | ICD-10-CM | POA: Diagnosis not present

## 2022-06-04 DIAGNOSIS — I10 Essential (primary) hypertension: Secondary | ICD-10-CM | POA: Diagnosis not present

## 2022-06-04 DIAGNOSIS — J431 Panlobular emphysema: Secondary | ICD-10-CM | POA: Diagnosis not present

## 2022-06-04 DIAGNOSIS — E039 Hypothyroidism, unspecified: Secondary | ICD-10-CM | POA: Diagnosis not present

## 2022-06-04 DIAGNOSIS — Z79899 Other long term (current) drug therapy: Secondary | ICD-10-CM | POA: Diagnosis not present

## 2022-06-05 DIAGNOSIS — R8290 Unspecified abnormal findings in urine: Secondary | ICD-10-CM | POA: Diagnosis not present

## 2022-06-12 ENCOUNTER — Ambulatory Visit (INDEPENDENT_AMBULATORY_CARE_PROVIDER_SITE_OTHER): Payer: HMO

## 2022-06-12 DIAGNOSIS — I442 Atrioventricular block, complete: Secondary | ICD-10-CM | POA: Diagnosis not present

## 2022-06-12 LAB — CUP PACEART REMOTE DEVICE CHECK
Battery Remaining Longevity: 27 mo
Battery Voltage: 2.91 V
Brady Statistic AP VP Percent: 0.35 %
Brady Statistic AP VS Percent: 0 %
Brady Statistic AS VP Percent: 99.18 %
Brady Statistic AS VS Percent: 0.47 %
Brady Statistic RA Percent Paced: 0.4 %
Brady Statistic RV Percent Paced: 99.53 %
Date Time Interrogation Session: 20230728070240
Implantable Lead Implant Date: 20200131
Implantable Lead Implant Date: 20200131
Implantable Lead Location: 753859
Implantable Lead Location: 753860
Implantable Lead Model: 3830
Implantable Lead Model: 5076
Implantable Pulse Generator Implant Date: 20200131
Lead Channel Impedance Value: 285 Ohm
Lead Channel Impedance Value: 342 Ohm
Lead Channel Impedance Value: 418 Ohm
Lead Channel Impedance Value: 418 Ohm
Lead Channel Pacing Threshold Amplitude: 0.375 V
Lead Channel Pacing Threshold Amplitude: 1.375 V
Lead Channel Pacing Threshold Pulse Width: 0.4 ms
Lead Channel Pacing Threshold Pulse Width: 0.4 ms
Lead Channel Sensing Intrinsic Amplitude: 2 mV
Lead Channel Sensing Intrinsic Amplitude: 2 mV
Lead Channel Sensing Intrinsic Amplitude: 5.875 mV
Lead Channel Sensing Intrinsic Amplitude: 5.875 mV
Lead Channel Setting Pacing Amplitude: 1.5 V
Lead Channel Setting Pacing Amplitude: 2.5 V
Lead Channel Setting Pacing Pulse Width: 0.5 ms
Lead Channel Setting Sensing Sensitivity: 4 mV

## 2022-06-29 ENCOUNTER — Other Ambulatory Visit: Payer: Self-pay | Admitting: *Deleted

## 2022-06-29 DIAGNOSIS — D693 Immune thrombocytopenic purpura: Secondary | ICD-10-CM

## 2022-06-29 DIAGNOSIS — Z9641 Presence of insulin pump (external) (internal): Secondary | ICD-10-CM | POA: Diagnosis not present

## 2022-06-29 DIAGNOSIS — E1065 Type 1 diabetes mellitus with hyperglycemia: Secondary | ICD-10-CM | POA: Diagnosis not present

## 2022-06-29 DIAGNOSIS — F17200 Nicotine dependence, unspecified, uncomplicated: Secondary | ICD-10-CM | POA: Diagnosis not present

## 2022-06-29 DIAGNOSIS — E1043 Type 1 diabetes mellitus with diabetic autonomic (poly)neuropathy: Secondary | ICD-10-CM | POA: Diagnosis not present

## 2022-06-29 DIAGNOSIS — E10649 Type 1 diabetes mellitus with hypoglycemia without coma: Secondary | ICD-10-CM | POA: Diagnosis not present

## 2022-06-29 DIAGNOSIS — D696 Thrombocytopenia, unspecified: Secondary | ICD-10-CM

## 2022-06-29 MED ORDER — ELTROMBOPAG OLAMINE 25 MG PO TABS: 25.0000 mg | ORAL_TABLET | Freq: Every day | ORAL | 3 refills | Status: DC

## 2022-06-29 NOTE — Telephone Encounter (Signed)
Promacta refill approved, as patient tolerating and is to continue on therapy.

## 2022-06-30 ENCOUNTER — Encounter (HOSPITAL_COMMUNITY): Payer: Self-pay | Admitting: Internal Medicine

## 2022-07-01 ENCOUNTER — Encounter (HOSPITAL_COMMUNITY): Payer: Self-pay | Admitting: Internal Medicine

## 2022-07-01 NOTE — Progress Notes (Signed)
Remote pacemaker transmission.   

## 2022-07-02 ENCOUNTER — Encounter (HOSPITAL_COMMUNITY): Payer: Self-pay | Admitting: Internal Medicine

## 2022-07-16 ENCOUNTER — Other Ambulatory Visit (HOSPITAL_COMMUNITY): Payer: Self-pay

## 2022-07-23 DIAGNOSIS — I10 Essential (primary) hypertension: Secondary | ICD-10-CM | POA: Diagnosis not present

## 2022-07-23 DIAGNOSIS — D692 Other nonthrombocytopenic purpura: Secondary | ICD-10-CM | POA: Diagnosis not present

## 2022-07-23 DIAGNOSIS — E785 Hyperlipidemia, unspecified: Secondary | ICD-10-CM | POA: Diagnosis not present

## 2022-08-24 DIAGNOSIS — E1065 Type 1 diabetes mellitus with hyperglycemia: Secondary | ICD-10-CM | POA: Diagnosis not present

## 2022-08-26 ENCOUNTER — Inpatient Hospital Stay: Payer: HMO

## 2022-08-26 ENCOUNTER — Inpatient Hospital Stay: Payer: HMO | Attending: Hematology | Admitting: Hematology

## 2022-08-26 ENCOUNTER — Encounter: Payer: Self-pay | Admitting: Hematology

## 2022-08-26 VITALS — HR 74 | Temp 97.4°F | Resp 18 | Wt 109.0 lb

## 2022-08-26 DIAGNOSIS — D693 Immune thrombocytopenic purpura: Secondary | ICD-10-CM | POA: Insufficient documentation

## 2022-08-26 DIAGNOSIS — R918 Other nonspecific abnormal finding of lung field: Secondary | ICD-10-CM | POA: Insufficient documentation

## 2022-08-26 DIAGNOSIS — D696 Thrombocytopenia, unspecified: Secondary | ICD-10-CM

## 2022-08-26 LAB — CBC WITH DIFFERENTIAL/PLATELET
Abs Immature Granulocytes: 0.01 10*3/uL (ref 0.00–0.07)
Basophils Absolute: 0 10*3/uL (ref 0.0–0.1)
Basophils Relative: 1 %
Eosinophils Absolute: 0.1 10*3/uL (ref 0.0–0.5)
Eosinophils Relative: 3 %
HCT: 38.4 % (ref 36.0–46.0)
Hemoglobin: 12.4 g/dL (ref 12.0–15.0)
Immature Granulocytes: 0 %
Lymphocytes Relative: 37 %
Lymphs Abs: 2 10*3/uL (ref 0.7–4.0)
MCH: 29.9 pg (ref 26.0–34.0)
MCHC: 32.3 g/dL (ref 30.0–36.0)
MCV: 92.5 fL (ref 80.0–100.0)
Monocytes Absolute: 0.4 10*3/uL (ref 0.1–1.0)
Monocytes Relative: 8 %
Neutro Abs: 2.8 10*3/uL (ref 1.7–7.7)
Neutrophils Relative %: 51 %
Platelets: 208 10*3/uL (ref 150–400)
RBC: 4.15 MIL/uL (ref 3.87–5.11)
RDW: 13.2 % (ref 11.5–15.5)
WBC: 5.4 10*3/uL (ref 4.0–10.5)
nRBC: 0 % (ref 0.0–0.2)

## 2022-08-26 LAB — COMPREHENSIVE METABOLIC PANEL
ALT: 22 U/L (ref 0–44)
AST: 28 U/L (ref 15–41)
Albumin: 3.7 g/dL (ref 3.5–5.0)
Alkaline Phosphatase: 90 U/L (ref 38–126)
Anion gap: 5 (ref 5–15)
BUN: 9 mg/dL (ref 8–23)
CO2: 30 mmol/L (ref 22–32)
Calcium: 8.6 mg/dL — ABNORMAL LOW (ref 8.9–10.3)
Chloride: 106 mmol/L (ref 98–111)
Creatinine, Ser: 0.67 mg/dL (ref 0.44–1.00)
GFR, Estimated: >60 mL/min (ref >60)
Glucose, Bld: 172 mg/dL — ABNORMAL HIGH (ref 70–99)
Potassium: 4.4 mmol/L (ref 3.5–5.1)
Sodium: 141 mmol/L (ref 135–145)
Total Bilirubin: 0.8 mg/dL (ref 0.3–1.2)
Total Protein: 6.2 g/dL — ABNORMAL LOW (ref 6.5–8.1)

## 2022-08-26 LAB — LACTATE DEHYDROGENASE: LDH: 191 U/L (ref 98–192)

## 2022-08-26 NOTE — Progress Notes (Signed)
[facility] [address], [place] [address]   CLINIC:  Medical Oncology/Hematology  PCP:  Madeline Arbour, MD [address] [facility] [place] / [place] [phone]  REASON FOR VISIT:  Follow-up for chronic ITP with exacerbations  PRIOR THERAPY: none  CURRENT THERAPY: Promacta 25 mg daily.  INTERVAL HISTORY:  [name], a 88 y.o. female, returns for follow-up of for chronic immune mediated thrombocytopenia.  She is tolerating Promacta reasonably well.  She has chronic diarrhea which has been stable.  She has not lost any weight from the last visit.  No fevers or infections reported.  REVIEW OF SYSTEMS:  Review of Systems  Gastrointestinal:  Positive for diarrhea.  Hematological:  Does not bruise/bleed easily.  All other systems reviewed and are negative.   PAST MEDICAL/SURGICAL HISTORY:  Past Medical History:  Diagnosis Date   Bradycardia    Coronary artery disease    Diabetes mellitus    Dyslipidemia    GERD (gastroesophageal reflux disease)    Hypothyroid    Presence of permanent cardiac pacemaker 12/16/2018   Medtronic dual lead pacemaker   Past Surgical History:  Procedure Laterality Date   APPENDECTOMY     BREAST BIOPSY Right 1967   EXCISIONAL - NEG   CHOLECYSTECTOMY     CORONARY STENT INTERVENTION     INSERT / REPLACE / REMOVE PACEMAKER  12/16/2018   no surgical hx     PACEMAKER IMPLANT N/A 12/16/2018   Procedure: PACEMAKER IMPLANT;  Surgeon: Marinus Maw, MD;  Location: [facility];  Service: Cardiovascular;  Laterality: N/A;    SOCIAL HISTORY:  Social History   Socioeconomic History   Marital status: Married    Spouse name: Not on file   Number of children: Not on file   Years of education: Not on file   Highest education level: Not on file  Occupational History   Not on file  Tobacco Use   Smoking status: Every Day    Packs/day: 1.00    Years: 50.00    Total pack years: 50.00     Types: Cigarettes   Smokeless tobacco: Never  Vaping Use   Vaping Use: Never used  Substance and Sexual Activity   Alcohol use: No   Drug use: No   Sexual activity: Not on file  Other Topics Concern   Not on file  Social History Narrative   Not on file   Social Determinants of Health   Financial Resource Strain: Not on file  Food Insecurity: No Food Insecurity (09/02/2020)   Hunger Vital Sign    Worried About Running Out of Food in the Last Year: Never true    Ran Out of Food in the Last Year: Never true  Transportation Needs: No Transportation Needs (09/02/2020)   PRAPARE - Administrator, Civil Service (Medical): No    Lack of Transportation (Non-Medical): No  Physical Activity: Not on file  Stress: Not on file  Social Connections: Not on file  Intimate Partner Violence: Not on file    FAMILY HISTORY:  Family History  Problem Relation Age of Onset   Heart Problems Mother    Other Father        UNK   Cancer Sister        BREAST   Breast cancer Sister 83   Cancer Sister        BREAST   Breast cancer Sister 52  Cancer Sister        BREAST   Diabetes Son     CURRENT MEDICATIONS:  Current Outpatient Medications  Medication Sig Dispense Refill   acetaminophen (TYLENOL) 325 MG tablet Take 1-2 tablets (325-650 mg total) by mouth every 6 (six) hours as needed for mild pain or moderate pain.     atorvastatin (LIPITOR) 20 MG tablet Take 1 tablet (20 mg total) by mouth daily at 6 PM. 90 tablet 3   cetirizine (ZYRTEC) 10 MG tablet Take 10 mg by mouth daily.     citalopram (CELEXA) 20 MG tablet Take 20 mg by mouth daily.     Continuous Blood Gluc Receiver (FREESTYLE LIBRE 2 READER) DEVI Use to monitor blood glucose.     CONTOUR NEXT TEST test strip USE 1 STRIP TO CHECK BLOOD SUGAR EIGHT TO NINE TIMES DAILY.     eltrombopag (PROMACTA) 25 MG tablet Take 1 tablet (25 mg total) by mouth daily. Take on an empty stomach, 1 hour before a meal or 2 hours after. 30  tablet 3   gabapentin (NEURONTIN) 100 MG capsule Take 100 mg twice a day for two weeks, then increase to 200 mg(2 tablets) twice a day and continue     Insulin Human (INSULIN PUMP) SOLN Inject 1 each into the skin continuous. Used with Novolog insulin     Lancets 28G MISC Use as instructed. 7-8 times daily.     levothyroxine (SYNTHROID) 75 MCG tablet Take by mouth.     lisinopril (PRINIVIL,ZESTRIL) 20 MG tablet Take 1 tablet (20 mg total) by mouth daily. 30 tablet 1   Multiple Vitamin (MULTIVITAMIN WITH MINERALS) TABS tablet Take 1 tablet by mouth daily.     NOVOLOG 100 UNIT/ML injection Inject 0-50 Units into the skin continuous. Up to 50 units/ day via pump 10 mL 0   No current facility-administered medications for this visit.    ALLERGIES:  Allergies  Allergen Reactions   Darvon     "FEEL SICK"   Propoxyphene Nausea Only and Nausea And Vomiting   Sulfa Antibiotics Nausea Only and Nausea And Vomiting    "FEEL SICK"     PHYSICAL EXAM:  Performance status (ECOG): 1 - Symptomatic but completely ambulatory  Vitals:   08/26/22 1324  Pulse: 74  Resp: 18  Temp: (!) 97.4 F (36.3 C)  SpO2: 97%   Wt Readings from Last 3 Encounters:  08/26/22 109 lb (49.4 kg)  06/03/22 109 lb 4.8 oz (49.6 kg)  04/22/22 115 lb 6.4 oz (52.3 kg)   Physical Exam Vitals reviewed.  Constitutional:      Appearance: Normal appearance.  Cardiovascular:     Rate and Rhythm: Normal rate and regular rhythm.     Pulses: Normal pulses.     Heart sounds: Normal heart sounds.  Pulmonary:     Effort: Pulmonary effort is normal.     Breath sounds: Normal breath sounds.  Neurological:     General: No focal deficit present.     Mental Status: She is alert and oriented to person, place, and time.  Psychiatric:        Mood and Affect: Mood normal.        Behavior: Behavior normal.     LABORATORY DATA:  I have reviewed the labs as listed.     Latest Ref Rng & Units 08/26/2022    1:01 PM 06/03/2022     2:14 PM 04/16/2022   12:47 PM  CBC  WBC 4.0 - 10.5  K/uL 5.4  7.4  5.6   Hemoglobin 12.0 - 15.0 g/dL 12.4  12.4  11.9   Hematocrit 36.0 - 46.0 % 38.4  38.2  36.6   Platelets 150 - 400 K/uL 208  242  210       Latest Ref Rng & Units 08/26/2022    1:01 PM 06/03/2022    2:14 PM 04/16/2022   12:47 PM  CMP  Glucose 70 - 99 mg/dL 172  85  199   BUN 8 - 23 mg/dL 9  9  10    Creatinine 0.44 - 1.00 mg/dL 0.67  0.60  0.55   Sodium 135 - 145 mmol/L 141  142  142   Potassium 3.5 - 5.1 mmol/L 4.4  3.9  3.9   Chloride 98 - 111 mmol/L 106  106  109   CO2 22 - 32 mmol/L 30  28  28    Calcium 8.9 - 10.3 mg/dL 8.6  8.9  8.4   Total Protein 6.5 - 8.1 g/dL 6.2  6.6  5.9   Total Bilirubin 0.3 - 1.2 mg/dL 0.8  0.6  0.7   Alkaline Phos 38 - 126 U/L 90  122  84   AST 15 - 41 U/L 28  26  108   ALT 0 - 44 U/L 22  18  45       Component Value Date/Time   RBC 4.15 08/26/2022 1301   MCV 92.5 08/26/2022 1301   MCV 92 12/09/2018 1308   MCH 29.9 08/26/2022 1301   MCHC 32.3 08/26/2022 1301   RDW 13.2 08/26/2022 1301   RDW 12.2 12/09/2018 1308   LYMPHSABS 2.0 08/26/2022 1301   LYMPHSABS 2.0 12/09/2018 1308   MONOABS 0.4 08/26/2022 1301   EOSABS 0.1 08/26/2022 1301   EOSABS 0.2 12/09/2018 1308   BASOSABS 0.0 08/26/2022 1301   BASOSABS 0.0 12/09/2018 1308    DIAGNOSTIC IMAGING:  I have independently reviewed the scans and discussed with the patient. No results found.   ASSESSMENT:  1.  Chronic ITP with exacerbations: Patient had thrombocytopenia since July 2016.  CT abdomen on 02/02/2018 showed normal-sized spleen.  Bone marrow aspiration and biopsy in 09/15/2017 showed hypercellular marrow for age, trilineage hematopoiesis with abundant megakaryocytes with no dysplasia. - Prednisone tapering dose over 5 weeks given on 12/14/2017 with no improvement in platelet count - IVIG (1 g/kg) on 12/23/2017 and 12/24/2017 with peak platelet count of 92, response lasting about 3 weeks -Dexamethasone 40 mg for 4 days on  01/11/2018 with improvement in platelet count of 61. - IVIG 1 g/kg on 12/18/2021 and 12/19/2021 without response. - Nplate 2 mcg/kg on 0/12/7739. - Promacta 25 mg daily started on 12/26/2021.     2.  Right middle lobe lung nodule: -CT of the chest on 08/08/2018 showed right middle lobe lung nodule with one-year follow-up recommended.    PLAN:  1.  Chronic ITP with exacerbations: - She is tolerating Promacta without any major GI side effects.  Chronic diarrhea is stable. - Reviewed labs today which shows normal LFTs.  Creatinine was normal.  CBC shows platelet count 208.  LDH was normal at 191. - Continue Promacta 25 mg daily. - RTC 3 months for follow-up.   2.  Right middle lobe lung nodule: - CT chest on 04/16/2022 shows benign lung nodules with no abnormalities.  No further scans needed.  3.  Health maintenance: - Mammogram on 12/29/2021 was BI-RADS Category 1.  Orders placed this encounter:  Orders Placed  This Encounter  Procedures   CBC with Differential/Platelet   Comprehensive metabolic panel   Lactate dehydrogenase      Doreatha Massed, MD [facility] [phone]

## 2022-08-26 NOTE — Patient Instructions (Signed)
[facility]  Discharge Instructions  You were seen and examined today by Dr. Delton Coombes.  Dr. Delton Coombes discussed your most recent lab work which revealed that everything looks good.  Continue taking the Promacta as prescribed.  Follow-up as scheduled in 3 months with labs.    Thank you for choosing [facility] to provide your oncology and hematology care.   To afford each patient quality time with our provider, please arrive at least 15 minutes before your scheduled appointment time. You may need to reschedule your appointment if you arrive late (10 or more minutes). Arriving late affects you and other patients whose appointments are after yours.  Also, if you miss three or more appointments without notifying the office, you may be dismissed from the clinic at the provider's discretion.    Again, thank you for choosing [facility]  Our hope is that these requests will decrease the amount of time that you wait before being seen by our physicians.   If you have a lab appointment with the [facility] please come in thru the Main Entrance and check in at the main information desk.           _____________________________________________________________  Should you have questions after your visit to [facility], please contact our office at [phone] and follow the prompts.  Our office hours are 8:00 a.m. to 4:30 p.m. Monday - Thursday and 8:00 a.m. to 2:30 p.m. Friday.  Please note that voicemails left after 4:00 p.m. may not be returned until the following business day.  We are closed weekends and all major holidays.  You do have access to a nurse 24-7, just call the main number to the clinic [phone] and do not press any options, hold on the line and a nurse will answer the phone.    For prescription refill requests, have your pharmacy contact our office and allow 72 hours.    Masks are optional in  the cancer centers. If you would like for your care team to wear a mask while they are taking care of you, please let them know. You may have one support person who is at least 86 years old accompany you for your appointments.

## 2022-09-11 ENCOUNTER — Ambulatory Visit (INDEPENDENT_AMBULATORY_CARE_PROVIDER_SITE_OTHER): Payer: HMO

## 2022-09-11 DIAGNOSIS — I442 Atrioventricular block, complete: Secondary | ICD-10-CM | POA: Diagnosis not present

## 2022-09-11 LAB — CUP PACEART REMOTE DEVICE CHECK
Battery Remaining Longevity: 17 mo
Battery Voltage: 2.9 V
Brady Statistic AP VP Percent: 0.28 %
Brady Statistic AP VS Percent: 0 %
Brady Statistic AS VP Percent: 99.43 %
Brady Statistic AS VS Percent: 0.28 %
Brady Statistic RA Percent Paced: 0.29 %
Brady Statistic RV Percent Paced: 99.72 %
Date Time Interrogation Session: 20231026213525
Implantable Lead Connection Status: 753985
Implantable Lead Connection Status: 753985
Implantable Lead Implant Date: 20200131
Implantable Lead Implant Date: 20200131
Implantable Lead Location: 753859
Implantable Lead Location: 753860
Implantable Lead Model: 3830
Implantable Lead Model: 5076
Implantable Pulse Generator Implant Date: 20200131
Lead Channel Impedance Value: 285 Ohm
Lead Channel Impedance Value: 342 Ohm
Lead Channel Impedance Value: 399 Ohm
Lead Channel Impedance Value: 456 Ohm
Lead Channel Pacing Threshold Amplitude: 0.5 V
Lead Channel Pacing Threshold Amplitude: 1.5 V
Lead Channel Pacing Threshold Pulse Width: 0.4 ms
Lead Channel Pacing Threshold Pulse Width: 0.4 ms
Lead Channel Sensing Intrinsic Amplitude: 2.25 mV
Lead Channel Sensing Intrinsic Amplitude: 2.25 mV
Lead Channel Sensing Intrinsic Amplitude: 5 mV
Lead Channel Sensing Intrinsic Amplitude: 5 mV
Lead Channel Setting Pacing Amplitude: 1.5 V
Lead Channel Setting Pacing Amplitude: 2.5 V
Lead Channel Setting Pacing Pulse Width: 0.5 ms
Lead Channel Setting Sensing Sensitivity: 4 mV
Zone Setting Status: 755011
Zone Setting Status: 755011

## 2022-09-16 ENCOUNTER — Encounter (HOSPITAL_COMMUNITY): Payer: Self-pay | Admitting: Internal Medicine

## 2022-09-16 NOTE — Progress Notes (Signed)
Remote pacemaker transmission.   

## 2022-09-22 ENCOUNTER — Other Ambulatory Visit (HOSPITAL_COMMUNITY): Payer: Self-pay

## 2022-09-24 ENCOUNTER — Telehealth: Payer: Self-pay

## 2022-09-24 NOTE — Telephone Encounter (Signed)
Oral Oncology Patient Advocate Encounter   Received notification that patient is due for re-enrollment for assistance for Promacta through [employer].   Re-enrollment process has been initiated and will be submitted upon completion of necessary documents.  [employer]' phone number [phone].   I will continue to follow until final determination.  Ardeen Fillers, CPhT Oncology Pharmacy Patient Advocate  [facility]  [phone] (phone) [phone] (fax) 09/24/2022 11:22 AM

## 2022-09-29 NOTE — Telephone Encounter (Signed)
Patient will be stopping by [facility] to sign paperwork today, 09/29/2022.  Ardeen Fillers, CPhT Oncology Pharmacy Patient Advocate  [facility]  [phone] (phone) [phone] (fax) 09/29/2022 2:23 PM

## 2022-09-29 NOTE — Telephone Encounter (Signed)
Oral Oncology Patient Advocate Encounter   Submitted application for assistance for Promacta to [employer].   Application submitted via e-fax to [phone]   [employer]' phone number [phone].   I will continue to check the status until final determination.   Ardeen Fillers, CPhT Oncology Pharmacy Patient Advocate  [facility]  [phone] (phone) [phone] (fax) 09/29/2022 4:03 PM

## 2022-10-04 ENCOUNTER — Other Ambulatory Visit: Payer: Self-pay

## 2022-10-04 ENCOUNTER — Inpatient Hospital Stay
Admission: EM | Admit: 2022-10-04 | Discharge: 2022-10-05 | DRG: 919 | Disposition: A | Discharge status: Home / Self Care | Payer: HMO | Attending: Internal Medicine | Admitting: Internal Medicine

## 2022-10-04 DIAGNOSIS — E440 Moderate protein-calorie malnutrition: Secondary | ICD-10-CM | POA: Diagnosis present

## 2022-10-04 DIAGNOSIS — N179 Acute kidney failure, unspecified: Secondary | ICD-10-CM | POA: Diagnosis present

## 2022-10-04 DIAGNOSIS — Z888 Allergy status to other drugs, medicaments and biological substances status: Secondary | ICD-10-CM | POA: Diagnosis not present

## 2022-10-04 DIAGNOSIS — Z833 Family history of diabetes mellitus: Secondary | ICD-10-CM

## 2022-10-04 DIAGNOSIS — Z9641 Presence of insulin pump (external) (internal): Secondary | ICD-10-CM | POA: Diagnosis present

## 2022-10-04 DIAGNOSIS — Y742 Prosthetic and other implants, materials and accessory general hospital and personal-use devices associated with adverse incidents: Secondary | ICD-10-CM | POA: Diagnosis present

## 2022-10-04 DIAGNOSIS — E1110 Type 2 diabetes mellitus with ketoacidosis without coma: Secondary | ICD-10-CM | POA: Diagnosis present

## 2022-10-04 DIAGNOSIS — Z955 Presence of coronary angioplasty implant and graft: Secondary | ICD-10-CM | POA: Diagnosis not present

## 2022-10-04 DIAGNOSIS — Z720 Tobacco use: Secondary | ICD-10-CM | POA: Diagnosis not present

## 2022-10-04 DIAGNOSIS — I5032 Chronic diastolic (congestive) heart failure: Secondary | ICD-10-CM | POA: Diagnosis present

## 2022-10-04 DIAGNOSIS — E039 Hypothyroidism, unspecified: Secondary | ICD-10-CM | POA: Diagnosis present

## 2022-10-04 DIAGNOSIS — Z79899 Other long term (current) drug therapy: Secondary | ICD-10-CM

## 2022-10-04 DIAGNOSIS — Z794 Long term (current) use of insulin: Secondary | ICD-10-CM

## 2022-10-04 DIAGNOSIS — I959 Hypotension, unspecified: Secondary | ICD-10-CM | POA: Diagnosis present

## 2022-10-04 DIAGNOSIS — E1010 Type 1 diabetes mellitus with ketoacidosis without coma: Secondary | ICD-10-CM | POA: Diagnosis present

## 2022-10-04 DIAGNOSIS — K219 Gastro-esophageal reflux disease without esophagitis: Secondary | ICD-10-CM | POA: Diagnosis present

## 2022-10-04 DIAGNOSIS — Z882 Allergy status to sulfonamides status: Secondary | ICD-10-CM | POA: Diagnosis not present

## 2022-10-04 DIAGNOSIS — T85614A Breakdown (mechanical) of insulin pump, initial encounter: Secondary | ICD-10-CM | POA: Diagnosis not present

## 2022-10-04 DIAGNOSIS — Z950 Presence of cardiac pacemaker: Secondary | ICD-10-CM

## 2022-10-04 DIAGNOSIS — F17210 Nicotine dependence, cigarettes, uncomplicated: Secondary | ICD-10-CM | POA: Diagnosis present

## 2022-10-04 DIAGNOSIS — D696 Thrombocytopenia, unspecified: Secondary | ICD-10-CM | POA: Diagnosis present

## 2022-10-04 DIAGNOSIS — E785 Hyperlipidemia, unspecified: Secondary | ICD-10-CM | POA: Diagnosis present

## 2022-10-04 DIAGNOSIS — I2510 Atherosclerotic heart disease of native coronary artery without angina pectoris: Secondary | ICD-10-CM | POA: Diagnosis present

## 2022-10-04 DIAGNOSIS — J449 Chronic obstructive pulmonary disease, unspecified: Secondary | ICD-10-CM | POA: Diagnosis present

## 2022-10-04 DIAGNOSIS — E119 Type 2 diabetes mellitus without complications: Secondary | ICD-10-CM | POA: Diagnosis not present

## 2022-10-04 DIAGNOSIS — R739 Hyperglycemia, unspecified: Principal | ICD-10-CM

## 2022-10-04 DIAGNOSIS — F32A Depression, unspecified: Secondary | ICD-10-CM | POA: Diagnosis present

## 2022-10-04 DIAGNOSIS — I110 Hypertensive heart disease with heart failure: Secondary | ICD-10-CM | POA: Diagnosis present

## 2022-10-04 DIAGNOSIS — E860 Dehydration: Secondary | ICD-10-CM | POA: Diagnosis present

## 2022-10-04 DIAGNOSIS — Z681 Body mass index (BMI) 19 or less, adult: Secondary | ICD-10-CM | POA: Diagnosis not present

## 2022-10-04 LAB — BASIC METABOLIC PANEL
Anion gap: 19 — ABNORMAL HIGH (ref 5–15)
Anion gap: 10 (ref 5–15)
Anion gap: 4 — ABNORMAL LOW (ref 5–15)
Anion gap: 8 (ref 5–15)
BUN: 26 mg/dL — ABNORMAL HIGH (ref 8–23)
BUN: 26 mg/dL — ABNORMAL HIGH (ref 8–23)
BUN: 26 mg/dL — ABNORMAL HIGH (ref 8–23)
BUN: 22 mg/dL (ref 8–23)
CO2: 18 mmol/L — ABNORMAL LOW (ref 22–32)
CO2: 17 mmol/L — ABNORMAL LOW (ref 22–32)
CO2: 24 mmol/L (ref 22–32)
CO2: 19 mmol/L — ABNORMAL LOW (ref 22–32)
Calcium: 9.4 mg/dL (ref 8.9–10.3)
Calcium: 8.1 mg/dL — ABNORMAL LOW (ref 8.9–10.3)
Calcium: 8.2 mg/dL — ABNORMAL LOW (ref 8.9–10.3)
Calcium: 8.1 mg/dL — ABNORMAL LOW (ref 8.9–10.3)
Chloride: 96 mmol/L — ABNORMAL LOW (ref 98–111)
Chloride: 112 mmol/L — ABNORMAL HIGH (ref 98–111)
Chloride: 113 mmol/L — ABNORMAL HIGH (ref 98–111)
Chloride: 115 mmol/L — ABNORMAL HIGH (ref 98–111)
Creatinine, Ser: 1.18 mg/dL — ABNORMAL HIGH (ref 0.44–1.00)
Creatinine, Ser: 1.02 mg/dL — ABNORMAL HIGH (ref 0.44–1.00)
Creatinine, Ser: 0.86 mg/dL (ref 0.44–1.00)
Creatinine, Ser: 0.89 mg/dL (ref 0.44–1.00)
GFR, Estimated: 45 mL/min — ABNORMAL LOW (ref >60)
GFR, Estimated: 54 mL/min — ABNORMAL LOW (ref >60)
GFR, Estimated: >60 mL/min (ref >60)
GFR, Estimated: >60 mL/min (ref >60)
Glucose, Bld: 670 mg/dL (ref 70–99)
Glucose, Bld: 354 mg/dL — ABNORMAL HIGH (ref 70–99)
Glucose, Bld: 258 mg/dL — ABNORMAL HIGH (ref 70–99)
Glucose, Bld: 172 mg/dL — ABNORMAL HIGH (ref 70–99)
Potassium: 5.1 mmol/L (ref 3.5–5.1)
Potassium: 4.2 mmol/L (ref 3.5–5.1)
Potassium: 3.7 mmol/L (ref 3.5–5.1)
Potassium: 3.7 mmol/L (ref 3.5–5.1)
Sodium: 133 mmol/L — ABNORMAL LOW (ref 135–145)
Sodium: 139 mmol/L (ref 135–145)
Sodium: 141 mmol/L (ref 135–145)
Sodium: 142 mmol/L (ref 135–145)

## 2022-10-04 LAB — CBG MONITORING, ED
Glucose-Capillary: >600 mg/dL (ref 70–99)
Glucose-Capillary: >600 mg/dL (ref 70–99)
Glucose-Capillary: >600 mg/dL (ref 70–99)
Glucose-Capillary: 596 mg/dL (ref 70–99)
Glucose-Capillary: 423 mg/dL — ABNORMAL HIGH (ref 70–99)
Glucose-Capillary: 501 mg/dL (ref 70–99)
Glucose-Capillary: 381 mg/dL — ABNORMAL HIGH (ref 70–99)
Glucose-Capillary: 244 mg/dL — ABNORMAL HIGH (ref 70–99)
Glucose-Capillary: 296 mg/dL — ABNORMAL HIGH (ref 70–99)
Glucose-Capillary: 263 mg/dL — ABNORMAL HIGH (ref 70–99)
Glucose-Capillary: 167 mg/dL — ABNORMAL HIGH (ref 70–99)
Glucose-Capillary: 188 mg/dL — ABNORMAL HIGH (ref 70–99)
Glucose-Capillary: 152 mg/dL — ABNORMAL HIGH (ref 70–99)
Glucose-Capillary: 231 mg/dL — ABNORMAL HIGH (ref 70–99)

## 2022-10-04 LAB — URINALYSIS, ROUTINE W REFLEX MICROSCOPIC
Bilirubin Urine: NEGATIVE
Glucose, UA: >=500 mg/dL — AB
Ketones, ur: 80 mg/dL — AB
Nitrite: NEGATIVE
Protein, ur: NEGATIVE mg/dL
Specific Gravity, Urine: 1.018 (ref 1.005–1.030)
pH: 5 (ref 5.0–8.0)

## 2022-10-04 LAB — BLOOD GAS, ARTERIAL
Acid-base deficit: 4.1 mmol/L — ABNORMAL HIGH (ref 0.0–2.0)
Bicarbonate: 20 mmol/L (ref 20.0–28.0)
O2 Saturation: 96.8 %
Patient temperature: 37
pCO2 arterial: 33 mmHg (ref 32–48)
pH, Arterial: 7.39 (ref 7.35–7.45)
pO2, Arterial: 78 mmHg — ABNORMAL LOW (ref 83–108)

## 2022-10-04 LAB — CBC
HCT: 37.5 % (ref 36.0–46.0)
Hemoglobin: 12.2 g/dL (ref 12.0–15.0)
MCH: 29.6 pg (ref 26.0–34.0)
MCHC: 32.5 g/dL (ref 30.0–36.0)
MCV: 91 fL (ref 80.0–100.0)
Platelets: 231 10*3/uL (ref 150–400)
RBC: 4.12 MIL/uL (ref 3.87–5.11)
RDW: 13.2 % (ref 11.5–15.5)
WBC: 10.2 10*3/uL (ref 4.0–10.5)
nRBC: 0 % (ref 0.0–0.2)

## 2022-10-04 LAB — BRAIN NATRIURETIC PEPTIDE: B Natriuretic Peptide: 168.5 pg/mL — ABNORMAL HIGH (ref 0.0–100.0)

## 2022-10-04 LAB — BETA-HYDROXYBUTYRIC ACID: Beta-Hydroxybutyric Acid: 5.08 mmol/L — ABNORMAL HIGH (ref 0.05–0.27)

## 2022-10-04 MED ORDER — LACTATED RINGERS IV SOLN: INTRAVENOUS | Status: DC

## 2022-10-04 MED ORDER — DEXTROSE 50 % IV SOLN: 0.0000 mL | INTRAVENOUS | Status: DC | PRN

## 2022-10-04 MED ORDER — LEVOTHYROXINE SODIUM 50 MCG PO TABS
75.0000 ug | ORAL_TABLET | Freq: Every day | ORAL | Status: DC
  Administered 2022-10-05: 75 ug via ORAL
  Filled 2022-10-04: qty 1

## 2022-10-04 MED ORDER — DM-GUAIFENESIN ER 30-600 MG PO TB12: 1.0000 | ORAL_TABLET | Freq: Two times a day (BID) | ORAL | Status: DC | PRN

## 2022-10-04 MED ORDER — CITALOPRAM HYDROBROMIDE 20 MG PO TABS
20.0000 mg | ORAL_TABLET | Freq: Every day | ORAL | Status: DC
  Administered 2022-10-04 – 2022-10-05 (×2): 20 mg via ORAL
  Filled 2022-10-04 (×2): qty 1

## 2022-10-04 MED ORDER — ACETAMINOPHEN 325 MG PO TABS: 650.0000 mg | ORAL_TABLET | Freq: Four times a day (QID) | ORAL | Status: DC | PRN

## 2022-10-04 MED ORDER — INSULIN ASPART 100 UNIT/ML IJ SOLN
0.0000 [IU] | Freq: Three times a day (TID) | INTRAMUSCULAR | Status: DC
  Administered 2022-10-05: 1 [IU] via SUBCUTANEOUS
  Filled 2022-10-04: qty 1

## 2022-10-04 MED ORDER — INSULIN GLARGINE-YFGN 100 UNIT/ML ~~LOC~~ SOLN
15.0000 [IU] | SUBCUTANEOUS | Status: DC
  Administered 2022-10-04: 15 [IU] via SUBCUTANEOUS
  Filled 2022-10-04 (×2): qty 0.15

## 2022-10-04 MED ORDER — ADULT MULTIVITAMIN W/MINERALS CH
1.0000 | ORAL_TABLET | Freq: Every day | ORAL | Status: DC
  Administered 2022-10-04 – 2022-10-05 (×2): 1 via ORAL
  Filled 2022-10-04 (×2): qty 1

## 2022-10-04 MED ORDER — SODIUM CHLORIDE 0.9 % IV BOLUS
2000.0000 mL | Freq: Once | INTRAVENOUS | Status: AC
  Administered 2022-10-04: 2000 mL via INTRAVENOUS

## 2022-10-04 MED ORDER — INSULIN ASPART 100 UNIT/ML IJ SOLN
0.0000 [IU] | Freq: Every day | INTRAMUSCULAR | Status: DC
  Administered 2022-10-05: 3 [IU] via SUBCUTANEOUS
  Filled 2022-10-04: qty 1

## 2022-10-04 MED ORDER — ONDANSETRON HCL 4 MG/2ML IJ SOLN: 4.0000 mg | Freq: Three times a day (TID) | INTRAMUSCULAR | Status: DC | PRN

## 2022-10-04 MED ORDER — INSULIN REGULAR(HUMAN) IN NACL 100-0.9 UT/100ML-% IV SOLN: INTRAVENOUS | Status: DC

## 2022-10-04 MED ORDER — ALBUTEROL SULFATE (2.5 MG/3ML) 0.083% IN NEBU: 3.0000 mL | INHALATION_SOLUTION | RESPIRATORY_TRACT | Status: DC | PRN

## 2022-10-04 MED ORDER — INSULIN ASPART 100 UNIT/ML IJ SOLN
3.0000 [IU] | Freq: Three times a day (TID) | INTRAMUSCULAR | Status: DC
  Administered 2022-10-05: 3 [IU] via SUBCUTANEOUS
  Filled 2022-10-04: qty 1

## 2022-10-04 MED ORDER — ATORVASTATIN CALCIUM 20 MG PO TABS
20.0000 mg | ORAL_TABLET | Freq: Every day | ORAL | Status: DC
  Administered 2022-10-04: 20 mg via ORAL
  Filled 2022-10-04: qty 1

## 2022-10-04 MED ORDER — SODIUM CHLORIDE 0.9 % IV BOLUS
1000.0000 mL | Freq: Once | INTRAVENOUS | Status: AC
  Administered 2022-10-04: 1000 mL via INTRAVENOUS

## 2022-10-04 MED ORDER — DEXTROSE IN LACTATED RINGERS 5 % IV SOLN: INTRAVENOUS | Status: DC

## 2022-10-04 MED ORDER — NICOTINE 21 MG/24HR TD PT24
21.0000 mg | MEDICATED_PATCH | Freq: Every day | TRANSDERMAL | Status: DC
  Filled 2022-10-04: qty 1

## 2022-10-04 MED ORDER — INSULIN REGULAR(HUMAN) IN NACL 100-0.9 UT/100ML-% IV SOLN
INTRAVENOUS | Status: DC
  Administered 2022-10-04: 4.8 [IU]/h via INTRAVENOUS
  Filled 2022-10-04: qty 100

## 2022-10-04 NOTE — ED Notes (Signed)
Multiple staff attempt Iv access, unsuccessful. Iv team stat consult placed. Call bell in reach. Stretcher locked in lowest position.

## 2022-10-04 NOTE — ED Triage Notes (Signed)
Pt type 1 DM. Reports meter started reading "high" 0300 today. Reports took 100 units of Novolog at home with no improvement. Pt reports n/v as well. Pt alert and following commands. Denies chest pain or pressure.

## 2022-10-04 NOTE — ED Notes (Signed)
Cbg [id number]

## 2022-10-04 NOTE — ED Notes (Signed)
Pt given turkey sandwich tray 

## 2022-10-04 NOTE — ED Provider Notes (Signed)
[facility] Provider Note    Event Date/Time   First MD Initiated Contact with Patient 10/04/22 [phone]     (approximate)  History   Chief Complaint: Hyperglycemia  HPI  Madeline [name] is a 88 y.o. female with a past medical history of diabetes, hyperlipidemia, gastric reflux, presents to the emergency department for elevated blood sugar.  According to the patient she has a continuous glucose reader that has been alarming her for high blood glucose.  Patient states she has an insulin pump but feels like it has not been working.  Contrary to triage note patient denies dosing herself any insulin prior to coming to the emergency department.  Patient also states for the past 24 hours or so she has been feeling nauseated with occasional episodes of vomiting.  Denies any abdominal pain.  No chest pain.  No fever cough or congestion.  Physical Exam   Triage Vital Signs: ED Triage Vitals  Enc Vitals Group     BP 10/04/22 0559 (!) 141/95     Pulse Rate 10/04/22 0559 (!) 101     Resp 10/04/22 0559 20     Temp 10/04/22 0609 97.7 F (36.5 C)     Temp Source 10/04/22 0609 Oral     SpO2 10/04/22 0559 96 %     Weight 10/04/22 0559 102 lb (46.3 kg)     Height 10/04/22 0559 5\' 2"  (1.575 m)     Head Circumference --      Peak Flow --      Pain Score 10/04/22 0559 7     Pain Loc --      Pain Edu? --      Excl. in GC? --     Most recent vital signs: Vitals:   10/04/22 0559 10/04/22 0609  BP: (!) 141/95   Pulse: (!) 101   Resp: 20   Temp:  97.7 F (36.5 C)  SpO2: 96%     General: Awake, no distress.  CV:  Good peripheral perfusion.  Regular rate and rhythm  Resp:  Normal effort.  Equal breath sounds bilaterally.  Abd:  No distention.  Soft, nontender.  No rebound or guarding.   ED Results / Procedures / Treatments   MEDICATIONS ORDERED IN ED: Medications  sodium chloride 0.9 % bolus 1,000 mL (has no administration in time range)     IMPRESSION / MDM /  ASSESSMENT AND PLAN / ED COURSE  I reviewed the triage vital signs and the nursing notes.  Patient's presentation is most consistent with acute presentation with potential threat to life or bodily function.  Patient presents emergency department for elevated blood sugar.  Patient states she is also been nausea with occasional episodes of vomiting.  Blood sugar reading greater than 600 on CBG in the emergency department.  We will check labs including a chemistry, VBG.  We will begin IV hydration.  We will continue to closely monitor and obtain a urinalysis sample.  Patient states she is not sure for insulin pump is malfunctioning states it has been beeping at her.  We will disconnect the insulin pump while in the emergency department.  Patient's labs have resulted showing an elevated blood glucose with also an elevated anion gap consistent with diabetic ketoacidosis.  We will start the patient on insulin infusion.  Patient is receiving IV fluids.  CBC shows no concerning abnormality.  Highly suspect a malfunctioning insulin pump.  We will start the patient on insulin infusion and  admit to the hospital service for further work-up and treatment.  Patient agreeable.  CRITICAL CARE Performed by: Minna Antis   Total critical care time: 30 minutes  Critical care time was exclusive of separately billable procedures and treating other patients.  Critical care was necessary to treat or prevent imminent or life-threatening deterioration.  Critical care was time spent personally by me on the following activities: development of treatment plan with patient and/or surrogate as well as nursing, discussions with consultants, evaluation of patient's response to treatment, examination of patient, obtaining history from patient or surrogate, ordering and performing treatments and interventions, ordering and review of laboratory studies, ordering and review of radiographic studies, pulse oximetry and  re-evaluation of patient's condition.   FINAL CLINICAL IMPRESSION(S) / ED DIAGNOSES   Hyperglycemia Diabetic ketoacidosis  Note:  This document was prepared using Dragon voice recognition software and may include unintentional dictation errors.   Minna Antis, MD 10/04/22 [phone]

## 2022-10-04 NOTE — Inpatient Diabetes Management (Signed)
Inpatient Diabetes Program Recommendations  AACE/ADA: New Consensus Statement on Inpatient Glycemic Control (2015)  Target Ranges:  Prepandial:   less than 140 mg/dL      Peak postprandial:   less than 180 mg/dL (1-2 hours)      Critically ill patients:  140 - 180 mg/dL   Lab Results  Component Value Date   GLUCAP 296 (H) 10/04/2022   HGBA1C 8.2 (H) 12/15/2021    Review of Glycemic Control  Latest Reference Range & Units 10/04/22 09:30 10/04/22 10:21 10/04/22 10:48 10/04/22 11:58 10/04/22 12:57  Glucose-Capillary 70 - 99 mg/dL 536 (HH) 644 (H) 034 (H) 244 (H) 296 (H)   Diabetes history: DM type 1 Outpatient Diabetes medications: Medtronic 723 insulin pump with Novolog Basal settings 12 AM 0.45 units/hr 6 am 0.925 units/hr 8 am 0.6 units/hr 5 pm 0.7 units/hr 24-hr basal = 14.85 units NovoLog   Bolus settings I:C 12AM 15, 11AM 12 Sensitivity 65 Target 100-120 Active insulin time 5 hrs  Current orders for Inpatient glycemic control:  IV insulin/DKA/ Endotool  Inpatient Diabetes Program Recommendations:    Remain on IV insulin until acidosis clears  At time of transition consider: -   Semglee 15 units -   Novolog 0-9 units tid + hs -   Novolog 3 units tid meal coverage if eating >50% of meals and glucose is at least 80% of meals   If pump failure is suspected, pt will need to call medtronic and get her pump switched out to be interrogated. Pt may need SQ insulin regimen based on what her insulin pump give her until pump is verified to be in proper working condition.  DM Coordinator to follow up with pt on 11/20.  Thanks,  Christena Deem RN, MSN, BC-ADM Inpatient Diabetes Coordinator Team Pager [phone] (8a-5p)

## 2022-10-04 NOTE — ED Notes (Signed)
Placed on purewick

## 2022-10-04 NOTE — H&P (Addendum)
History and Physical    Madeline [name] QPY:[id number] DOB: [birth year] DOA: 10/04/2022  Referring MD/NP/PA:   PCP: Marguarite Arbour, MD   Patient coming from:  The patient is coming from home.  At baseline, pt is independent for most of ADL.        Chief Complaint: Elevated blood sugar  HPI: Madeline [name] is a 88 y.o. female with medical history significant of type 1 diabetes on insulin pump, hypertension, hyperlipidemia, GERD, hypothyroidism, depression, pacemaker placement due to third-degree AV block, CAD, tobacco abuse, thrombocytopenia, dCHF, who presents with elevated blood sugar.  Patient is a poor historian, history is limited.  Per patient and her husband at the bedside, she has continuous glucose reader that has been alarming her for high blood glucose. Patient states she has an insulin pump but feels like it has not been working.  Contrary to triage note patient denies dosing herself any insulin prior to coming to ED. She states that she has nausea and occasional vomiting, denies abdominal pain.  She states that she had diarrhea recently, but no diarrhea today.  Denies chest pain, cough, shortness breath.  Patient is drowsy, but easily arousable, oriented x3.  Patient moves all extremities.  No facial droop or slurred speech.  Patient was found to have DKA with blood sugar 670, bicarbonate 18, anion gap 19.  Patient had hypotension with blood pressure down to 78/40, which improved to 108/45 after giving normal saline bolus.  Data reviewed independently and ED Course: pt was found to have WBC 10.2, BMP 168, pending urinalysis, pending beta hydroxybutyric acid, AKI with creatinine 1.18, BUN 26, GFR 45 (baseline creatinine 0.67 on 08/26/2022), temperature normal, heart rate 101, 95, RR 21, oxygen saturation 96% on room air.  Patient is admitted to stepdown as inpatient.   EKG: I have personally reviewed.  Paced rhythm, QTc 507   Review of Systems:   General: no fevers, chills,  no body weight gain, has fatigue HEENT: no blurry vision, hearing changes or sore throat Respiratory: no dyspnea, coughing, wheezing CV: no chest pain, no palpitations GI: has nausea, vomiting, no abdominal pain, diarrhea, constipation GU: no dysuria, burning on urination, increased urinary frequency, hematuria  Ext: no leg edema Neuro: no unilateral weakness, numbness, or tingling, no vision change or hearing loss Skin: no rash, no skin tear. MSK: No muscle spasm, no deformity, no limitation of range of movement in spin Heme: No easy bruising.  Travel history: No recent long distant travel.   Allergy:  Allergies  Allergen Reactions   Darvon     "FEEL SICK"   Propoxyphene Nausea Only and Nausea And Vomiting   Sulfa Antibiotics Nausea Only and Nausea And Vomiting    "FEEL SICK"     Past Medical History:  Diagnosis Date   Bradycardia    Coronary artery disease    Diabetes mellitus    Dyslipidemia    GERD (gastroesophageal reflux disease)    Hypothyroid    Presence of permanent cardiac pacemaker 12/16/2018   Medtronic dual lead pacemaker    Past Surgical History:  Procedure Laterality Date   APPENDECTOMY     BREAST BIOPSY Right 1967   EXCISIONAL - NEG   CHOLECYSTECTOMY     CORONARY STENT INTERVENTION     INSERT / REPLACE / REMOVE PACEMAKER  12/16/2018   no surgical hx     PACEMAKER IMPLANT N/A 12/16/2018   Procedure: PACEMAKER IMPLANT;  Surgeon: Marinus Maw, MD;  Location: [facility] INVASIVE CV  LAB;  Service: Cardiovascular;  Laterality: N/A;    Social History:  reports that she has been smoking cigarettes. She has a 50.00 pack-year smoking history. She has never used smokeless tobacco. She reports that she does not drink alcohol and does not use drugs.  Family History:  Family History  Problem Relation Age of Onset   Heart Problems Mother    Other Father        UNK   Cancer Sister        BREAST   Breast cancer Sister 46   Cancer Sister        BREAST   Breast  cancer Sister 8   Cancer Sister        BREAST   Diabetes Son      Prior to Admission medications   Medication Sig Start Date End Date Taking? Authorizing Provider  acetaminophen (TYLENOL) 325 MG tablet Take 1-2 tablets (325-650 mg total) by mouth every 6 (six) hours as needed for mild pain or moderate pain. 12/17/18   Barrett, Joline Salt, PA-C  atorvastatin (LIPITOR) 20 MG tablet Take 1 tablet (20 mg total) by mouth daily at 6 PM. 12/09/20   Pricilla Riffle, MD  cetirizine (ZYRTEC) 10 MG tablet Take 10 mg by mouth daily.    [provider]  citalopram (CELEXA) 20 MG tablet Take 20 mg by mouth daily.    [provider]  Continuous Blood Gluc Receiver (FREESTYLE LIBRE 2 READER) DEVI Use to monitor blood glucose. 04/27/22   [provider]  CONTOUR NEXT TEST test strip USE 1 STRIP TO CHECK BLOOD SUGAR EIGHT TO NINE TIMES DAILY. 03/27/19   [provider]  eltrombopag (PROMACTA) 25 MG tablet Take 1 tablet (25 mg total) by mouth daily. Take on an empty stomach, 1 hour before a meal or 2 hours after. 06/29/22   Doreatha Massed, MD  gabapentin (NEURONTIN) 100 MG capsule Take 100 mg twice a day for two weeks, then increase to 200 mg(2 tablets) twice a day and continue 03/26/22   [provider]  Insulin Human (INSULIN PUMP) SOLN Inject 1 each into the skin continuous. Used with Novolog insulin    [provider]  Lancets 28G MISC Use as instructed. 7-8 times daily. 06/02/19   [provider]  levothyroxine (SYNTHROID) 75 MCG tablet Take by mouth. 04/10/22 04/10/23  [provider]  lisinopril (PRINIVIL,ZESTRIL) 20 MG tablet Take 1 tablet (20 mg total) by mouth daily. 09/22/18 08/26/22  Houston Siren, MD  Multiple Vitamin (MULTIVITAMIN WITH MINERALS) TABS tablet Take 1 tablet by mouth daily.    [provider]  NOVOLOG 100 UNIT/ML injection Inject 0-50 Units into the skin continuous. Up to 50 units/ day via pump 12/14/21    Sharman Cheek, MD    Physical Exam: Vitals:   10/04/22 0845 10/04/22 0900 10/04/22 0930 10/04/22 1046  BP: (!) 86/42 (!) 93/41 (!) 108/45   Pulse: 90 98 (!) 102   Resp: 20 18 18    Temp:    98.4 F (36.9 C)  TempSrc:    Oral  SpO2: 100% 100% 99%   Weight:      Height:       General: Not in acute distress.  Dry mucous membrane HEENT:       Eyes: PERRL, EOMI, no scleral icterus.       ENT: No discharge from the ears and nose, no pharynx injection, no tonsillar enlargement.        Neck:  No JVD, no bruit, no mass felt. Heme: No neck lymph node enlargement. Cardiac: S1/S2, RRR, No murmurs, No gallops or rubs. Respiratory: No rales, wheezing, rhonchi or rubs. GI: Soft, nondistended, nontender, no rebound pain, no organomegaly, BS present. GU: No hematuria Ext: No pitting leg edema bilaterally. 1+DP/PT pulse bilaterally. Musculoskeletal: No joint deformities, No joint redness or warmth, no limitation of ROM in spin. Skin: No rashes.  Neuro: Drowsy, but easily arousable, oriented X3, cranial nerves II-XII grossly intact, moves all extremities normally.  Psych: Patient is not psychotic, no suicidal or hemocidal ideation.  Labs on Admission: I have personally reviewed following labs and imaging studies  CBC: Recent Labs  Lab 10/04/22 0602  WBC 10.2  HGB 12.2  HCT 37.5  MCV 91.0  PLT 231   Basic Metabolic Panel: Recent Labs  Lab 10/04/22 0602  NA 133*  K 5.1  CL 96*  CO2 18*  GLUCOSE 670*  BUN 26*  CREATININE 1.18*  CALCIUM 9.4   GFR: Estimated Creatinine Clearance: 25 mL/min (A) (by C-G formula based on SCr of 1.18 mg/dL (H)). Liver Function Tests: No results for input(s): "AST", "ALT", "ALKPHOS", "BILITOT", "PROT", "ALBUMIN" in the last 168 hours. No results for input(s): "LIPASE", "AMYLASE" in the last 168 hours. No results for input(s): "AMMONIA" in the last 168 hours. Coagulation Profile: No results for input(s): "INR", "PROTIME" in the last 168  hours. Cardiac Enzymes: No results for input(s): "CKTOTAL", "CKMB", "CKMBINDEX", "TROPONINI" in the last 168 hours. BNP (last 3 results) No results for input(s): "PROBNP" in the last 8760 hours. HbA1C: No results for input(s): "HGBA1C" in the last 72 hours. CBG: Recent Labs  Lab 10/04/22 0841 10/04/22 0930 10/04/22 1021 10/04/22 1048 10/04/22 1158  GLUCAP 596* 501* 423* 381* 244*   Lipid Profile: No results for input(s): "CHOL", "HDL", "LDLCALC", "TRIG", "CHOLHDL", "LDLDIRECT" in the last 72 hours. Thyroid Function Tests: No results for input(s): "TSH", "T4TOTAL", "FREET4", "T3FREE", "THYROIDAB" in the last 72 hours. Anemia Panel: No results for input(s): "VITAMINB12", "FOLATE", "FERRITIN", "TIBC", "IRON", "RETICCTPCT" in the last 72 hours. Urine analysis:    Component Value Date/Time   COLORURINE AMBER (A) 10/04/2022 1137   APPEARANCEUR CLOUDY (A) 10/04/2022 1137   LABSPEC 1.018 10/04/2022 1137   PHURINE 5.0 10/04/2022 1137   GLUCOSEU >=500 (A) 10/04/2022 1137   HGBUR SMALL (A) 10/04/2022 1137   BILIRUBINUR NEGATIVE 10/04/2022 1137   KETONESUR 80 (A) 10/04/2022 1137   PROTEINUR NEGATIVE 10/04/2022 1137   NITRITE NEGATIVE 10/04/2022 1137   LEUKOCYTESUR TRACE (A) 10/04/2022 1137   Sepsis Labs: @LABRCNTIP (procalcitonin:4,lacticidven:4) )No results found for this or any previous visit (from the past 240 hour(s)).   Radiological Exams on Admission: No results found.    Assessment/Plan Principal Problem:   DKA (diabetic ketoacidosis) (HCC) Active Problems:   Diabetes mellitus without complication (HCC)   Hypotension   CAD (coronary artery disease)   Dyslipidemia   Thrombocytopenia (HCC)   Hypothyroidism   COPD, moderate (HCC)   AKI (acute kidney injury) (HCC)   Chronic diastolic CHF (congestive heart failure) (HCC)   Depression   Tobacco abuse   Protein-calorie malnutrition, moderate (HCC)   Assessment and Plan:  DKA (diabetic ketoacidosis) (HCC): blood  sugar 670 and AG 19.  Beta hydroxybutyric acid 5.08.  Likely due to insulin pump malfunction.  - Admit to stepdown as inpt - IVF:  3L of NS bolus - start DKA protocol with BMP q4h - IVF: LR at 125 cc/h, will switch to D5-LR at 125  cc/h when CBG<250 - replete K as needed - Zofran prn nausea  - NPO   Addendum: BMP showed closed AG x 2. Transition off insulin gtt: -will start glargine insulin  15 units daily -Let pt eat food at two hours after giving glargine insulin -Stop insulin gtt and IVF at 2 hours after giving glargin insulin -will start SSI and Novolog 3 units tid for meal coverage -will change bed to tele bed   Diabetes mellitus without complication-type I DM: Recent A1c 8.2, poorly controlled.  Patient is on insulin pump, which is likely not working appropriately. -On insulin drip now -Consult diabetic educator  Hypotension: Blood pressure 78/40 which improved to 108/45.  No source of infection identified.  Does not seem to have sepsis.  Likely due to dehydration -IV fluid as above  CAD (coronary artery disease) -Lipitor  Dyslipidemia -Lipitor  Thrombocytopenia (HCC): Platelet 231 -Follow-up with CBC -Patient is not taking Promacta  Hypothyroidism -Synthroid  COPD, moderate (HCC): Stable -Bronchodilators  AKI (acute kidney injury) (HCC): Mild AKI.  Creatinine 1.1 8, BUN 26, GFR 45.  Recent baseline creatinine 0.67 on 08/26/2022.  Most likely due to dehydration.  ATN is also possible due to hypotension. -IV fluid as above -Hold lisinopril  Chronic diastolic CHF (congestive heart failure) (HCC): 2D echo on 12/25/2021 showed EF of 60 to 65% with grade 1 diastolic dysfunction.  Patient does not have leg edema JVD.  No oxygen desaturation.  BNP 168.  CHF seem to be compensated. -Watch volume status closely  Depression -Continue home medications  Tobacco abuse -Nicotine patch  Protein-calorie malnutrition, moderate: BMI 18.66 and BW 46.3 kg -consult to  nutrition    DVT ppx: SCD  Code Status: Full code per pt and her husband  Family Communication:   Yes, patient's husband   at bed side.    Disposition Plan:  Anticipate discharge back to previous environment  Consults called:  none  Admission status and Level of care: Stepdown:    as inpt     Dispo: The patient is from: Home              Anticipated d/c is to: Home              Anticipated d/c date is: 2 days              Patient currently is not medically stable to d/c.    Severity of Illness:  The appropriate patient status for this patient is INPATIENT. Inpatient status is judged to be reasonable and necessary in order to provide the required intensity of service to ensure the patient's safety. The patient's presenting symptoms, physical exam findings, and initial radiographic and laboratory data in the context of their chronic comorbidities is felt to place them at high risk for further clinical deterioration. Furthermore, it is not anticipated that the patient will be medically stable for discharge from the hospital within 2 midnights of admission.   * I certify that at the point of admission it is my clinical judgment that the patient will require inpatient hospital care spanning beyond 2 midnights from the point of admission due to high intensity of service, high risk for further deterioration and high frequency of surveillance required.*       Date of Service 10/04/2022    Lorretta Harp [facility]   If 7PM-7AM, please contact night-coverage [email or web address] 10/04/2022, 12:38 PM

## 2022-10-04 NOTE — ED Notes (Addendum)
Pt emesis x2. Bp 81/34. ER doctor to attempt  [place] IV by while waiting for IV team. D rNiu aware

## 2022-10-05 ENCOUNTER — Other Ambulatory Visit (HOSPITAL_COMMUNITY): Payer: Self-pay

## 2022-10-05 DIAGNOSIS — E440 Moderate protein-calorie malnutrition: Secondary | ICD-10-CM

## 2022-10-05 DIAGNOSIS — E039 Hypothyroidism, unspecified: Secondary | ICD-10-CM

## 2022-10-05 DIAGNOSIS — E119 Type 2 diabetes mellitus without complications: Secondary | ICD-10-CM

## 2022-10-05 DIAGNOSIS — I5032 Chronic diastolic (congestive) heart failure: Secondary | ICD-10-CM

## 2022-10-05 LAB — BASIC METABOLIC PANEL
Anion gap: 7 (ref 5–15)
Anion gap: 6 (ref 5–15)
Anion gap: 6 (ref 5–15)
BUN: 18 mg/dL (ref 8–23)
BUN: 15 mg/dL (ref 8–23)
BUN: 14 mg/dL (ref 8–23)
CO2: 22 mmol/L (ref 22–32)
CO2: 22 mmol/L (ref 22–32)
CO2: 23 mmol/L (ref 22–32)
Calcium: 8.4 mg/dL — ABNORMAL LOW (ref 8.9–10.3)
Calcium: 8.1 mg/dL — ABNORMAL LOW (ref 8.9–10.3)
Calcium: 8.3 mg/dL — ABNORMAL LOW (ref 8.9–10.3)
Chloride: 116 mmol/L — ABNORMAL HIGH (ref 98–111)
Chloride: 114 mmol/L — ABNORMAL HIGH (ref 98–111)
Chloride: 114 mmol/L — ABNORMAL HIGH (ref 98–111)
Creatinine, Ser: 0.73 mg/dL (ref 0.44–1.00)
Creatinine, Ser: 0.62 mg/dL (ref 0.44–1.00)
Creatinine, Ser: 0.62 mg/dL (ref 0.44–1.00)
GFR, Estimated: >60 mL/min (ref >60)
GFR, Estimated: >60 mL/min (ref >60)
GFR, Estimated: >60 mL/min (ref >60)
Glucose, Bld: 129 mg/dL — ABNORMAL HIGH (ref 70–99)
Glucose, Bld: 76 mg/dL (ref 70–99)
Glucose, Bld: 132 mg/dL — ABNORMAL HIGH (ref 70–99)
Potassium: 3.6 mmol/L (ref 3.5–5.1)
Potassium: 3.9 mmol/L (ref 3.5–5.1)
Potassium: 3.6 mmol/L (ref 3.5–5.1)
Sodium: 145 mmol/L (ref 135–145)
Sodium: 142 mmol/L (ref 135–145)
Sodium: 143 mmol/L (ref 135–145)

## 2022-10-05 LAB — GLUCOSE, CAPILLARY
Glucose-Capillary: 72 mg/dL (ref 70–99)
Glucose-Capillary: 124 mg/dL — ABNORMAL HIGH (ref 70–99)

## 2022-10-05 LAB — CBG MONITORING, ED: Glucose-Capillary: 256 mg/dL — ABNORMAL HIGH (ref 70–99)

## 2022-10-05 MED ORDER — INSULIN PUMP: 1.0000 | SUBCUTANEOUS | Status: DC

## 2022-10-05 MED ORDER — INSULIN SYRINGES (DISPOSABLE) U-100 0.3 ML MISC: 100.0000 | Freq: Every day | 2 refills | Status: DC

## 2022-10-05 MED ORDER — INSULIN ASPART 100 UNIT/ML IJ SOLN: INTRAMUSCULAR | 11 refills | Status: DC

## 2022-10-05 MED ORDER — INSULIN GLARGINE 100 UNIT/ML ~~LOC~~ SOLN: 15.0000 [IU] | Freq: Every day | SUBCUTANEOUS | 3 refills | Status: DC

## 2022-10-05 MED ORDER — NICOTINE 21 MG/24HR TD PT24: 21.0000 mg | MEDICATED_PATCH | Freq: Every day | TRANSDERMAL | 0 refills | Status: DC

## 2022-10-05 NOTE — TOC Progression Note (Addendum)
Transition of Care [facility]) - Progression Note    Patient Details  Name: Madeline [name] DOBROWOLSKI MRN: [id number] Date of Birth: 1933-11-29  [facility] CM/SW Contact  Tempie Hoist, [place] Phone Number: 10/05/2022, 3:05 PM  Clinical Narrative:     TOC spoke to the patient and family. They are interested in [facility] and HHPT. TOC has reached out to several home health agencies.       Expected Discharge Plan and Services      [facility] and HHPT     Expected Discharge Date: 10/05/22                                     Social Determinants of Health (SDOH) Interventions    Readmission Risk Interventions    12/16/2021   10:51 AM  Readmission Risk Prevention Plan  Transportation Screening Complete  PCP or Specialist Appt within 5-7 Days Complete  Home Care Screening Complete  Medication Review (RN CM) Complete

## 2022-10-05 NOTE — Discharge Summary (Incomplete)
Physician Discharge Summary   Patient: Madeline [name] MRN: [id number] DOB: 02-07-1934  Admit date:     10/04/2022  Discharge date: {dischdate:26783}  Discharge Physician: Kathlen Mody   PCP: Madeline Arbour, MD   Recommendations at discharge:  {Tip this will not be part of the note when signed- Example include specific recommendations for outpatient follow-up, pending tests to follow-up on. (Optional):26781}  ***  Discharge Diagnoses: Principal Problem:   DKA (diabetic ketoacidosis) (HCC) Active Problems:   Diabetes mellitus without complication (HCC)   Hypotension   CAD (coronary artery disease)   Dyslipidemia   Thrombocytopenia (HCC)   Hypothyroidism   COPD, moderate (HCC)   AKI (acute kidney injury) (HCC)   Chronic diastolic CHF (congestive heart failure) (HCC)   Depression   Tobacco abuse   Protein-calorie malnutrition, moderate (HCC)  Resolved Problems:   * No resolved hospital problems. [facility] Course: No notes on file  Assessment and Plan: No notes have been filed under this hospital service. Service: Hospitalist     {Tip this will not be part of the note when signed Body mass index is 18.66 kg/m. , ,  (Optional):26781}  {(NOTE) Pain control PDMP Statment (Optional):26782} Consultants: *** Procedures performed: ***  Disposition: {Plan; Disposition:26390} Diet recommendation:  Discharge Diet Orders (From admission, onward)     Start     Ordered   10/05/22 0000  Diet - low sodium heart healthy        10/05/22 1239           {Diet_Plan:26776} DISCHARGE MEDICATION: Allergies as of 10/05/2022       Reactions   Darvon    "FEEL SICK"   Propoxyphene Nausea Only, Nausea And Vomiting   Sulfa Antibiotics Nausea Only, Nausea And Vomiting   "FEEL SICK"         Medication List     STOP taking these medications    eltrombopag 25 MG tablet Commonly known as: PROMACTA   gabapentin 100 MG capsule Commonly known as: NEURONTIN        TAKE these medications    acetaminophen 325 MG tablet Commonly known as: TYLENOL Take 1-2 tablets (325-650 mg total) by mouth every 6 (six) hours as needed for mild pain or moderate pain.   atorvastatin 20 MG tablet Commonly known as: LIPITOR Take 1 tablet (20 mg total) by mouth daily at 6 PM.   cetirizine 10 MG tablet Commonly known as: ZYRTEC Take 10 mg by mouth daily.   citalopram 20 MG tablet Commonly known as: CELEXA Take 20 mg by mouth daily.   Contour Next Test test strip Generic drug: glucose blood USE 1 STRIP TO CHECK BLOOD SUGAR EIGHT TO NINE TIMES DAILY.   FreeStyle Libre 2 Reader [place] Use to monitor blood glucose.   insulin glargine 100 UNIT/ML injection Commonly known as: LANTUS Inject 0.15 mLs (15 Units total) into the skin at bedtime. Please do not use Lantus if the insulin pump is working.   insulin pump Soln Inject 1 each into the skin continuous. Used with Novolog insulin.  Please do not use Lantus if the pump is working. What changed: additional instructions   Insulin Syringes (Disposable) U-100 0.3 ML Misc 100 each by Does not apply route daily.   Lancets 28G Misc Use as instructed. 7-8 times daily.   levothyroxine 75 MCG tablet Commonly known as: SYNTHROID Take by mouth.   lisinopril 20 MG tablet Commonly known as: ZESTRIL Take 1 tablet (20 mg total) by mouth  daily.   multivitamin with minerals Tabs tablet Take 1 tablet by mouth daily.   nicotine 21 mg/24hr patch Commonly known as: NICODERM CQ - dosed in mg/24 hours Place 1 patch (21 mg total) onto the skin daily. Start taking on: October 06, 2022   NovoLOG 100 UNIT/ML injection Generic drug: insulin aspart Inject 0-50 Units into the skin continuous. Up to 50 units/ day via pump        Follow-up Information     Madeline Arbour, MD. Go in 1 week(s).   Specialty: Internal Medicine Why: office will call patient to schedule a follow-up appt. Contact information: [facility] [address] [place] [address] [phone]                Discharge Exam: Ceasar Mons Weights   10/04/22 0559  Weight: 46.3 kg   ***  Condition at discharge: {DC Condition:26389}  The results of significant diagnostics from this hospitalization (including imaging, microbiology, ancillary and laboratory) are listed below for reference.   Imaging Studies: CUP PACEART REMOTE DEVICE CHECK  Result Date: 09/11/2022 Scheduled remote reviewed. Normal device function.  Next remote 91 days. LA   Microbiology: Results for orders placed or performed during the hospital encounter of 12/15/21  Resp Panel by RT-PCR (Flu A&B, Covid) Nasopharyngeal Swab     Status: None   Collection Time: 12/15/21  2:15 PM   Specimen: Nasopharyngeal Swab; Nasopharyngeal(NP) swabs in vial transport medium  Result Value Ref Range Status   SARS Coronavirus 2 by RT PCR NEGATIVE NEGATIVE Final    Comment: (NOTE) SARS-CoV-2 target nucleic acids are NOT DETECTED.  The SARS-CoV-2 RNA is generally detectable in upper respiratory specimens during the acute phase of infection. The lowest concentration of SARS-CoV-2 viral copies this assay can detect is 138 copies/mL. A negative result does not preclude SARS-Cov-2 infection and should not be used as the sole basis for treatment or other patient management decisions. A negative result may occur with  improper specimen collection/handling, submission of specimen other than nasopharyngeal swab, presence of viral mutation(s) within the areas targeted by this assay, and inadequate number of viral copies(<138 copies/mL). A negative result must be combined with clinical observations, patient history, and epidemiological information. The expected result is Negative.  Fact Sheet for Patients:  BloggerCourse.com  Fact Sheet for Healthcare Providers:  SeriousBroker.it  This test is no t yet approved  or cleared by the [place] FDA and  has been authorized for detection and/or diagnosis of SARS-CoV-2 by FDA under an Emergency Use Authorization (EUA). This EUA will remain  in effect (meaning this test can be used) for the duration of the COVID-19 declaration under Section 564(b)(1) of the Act, 21 U.S.C.section 360bbb-3(b)(1), unless the authorization is terminated  or revoked sooner.       Influenza A by PCR NEGATIVE NEGATIVE Final   Influenza B by PCR NEGATIVE NEGATIVE Final    Comment: (NOTE) The Xpert Xpress SARS-CoV-2/FLU/RSV plus assay is intended as an aid in the diagnosis of influenza from Nasopharyngeal swab specimens and should not be used as a sole basis for treatment. Nasal washings and aspirates are unacceptable for Xpert Xpress SARS-CoV-2/FLU/RSV testing.  Fact Sheet for Patients: BloggerCourse.com  Fact Sheet for Healthcare Providers: SeriousBroker.it  This test is not yet approved or cleared by the [place] FDA and has been authorized for detection and/or diagnosis of SARS-CoV-2 by FDA under an Emergency Use Authorization (EUA). This EUA will remain in effect (meaning this test can  be used) for the duration of the COVID-19 declaration under Section 564(b)(1) of the Act, 21 U.S.C. section 360bbb-3(b)(1), unless the authorization is terminated or revoked.  Performed at [facility], [address] [place], [place] [address]   MRSA Next Gen by PCR, Nasal     Status: None   Collection Time: 12/16/21  7:00 AM   Specimen: Nasal Mucosa; Nasal Swab  Result Value Ref Range Status   MRSA by PCR Next Gen NOT DETECTED NOT DETECTED Final    Comment: (NOTE) The GeneXpert MRSA Assay (FDA approved for NASAL specimens only), is one component of a comprehensive MRSA colonization surveillance program. It is not intended to diagnose MRSA infection nor to guide or monitor treatment for MRSA  infections. Test performance is not FDA approved in patients less than 73 years old. Performed at [facility], [address] [place], [place] [address]   Urine Culture     Status: Abnormal   Collection Time: 12/17/21 12:00 PM   Specimen: Urine, Random  Result Value Ref Range Status   Specimen Description   Final    URINE, RANDOM Performed at [facility], [address] [place], [place] [address]    Special Requests   Final    NONE Performed at [facility], [address] [place], [place] [address]    Culture 30,000 COLONIES/mL ENTEROCOCCUS FAECALIS (A)  Final   Report Status 12/19/2021 FINAL  Final   Organism ID, Bacteria ENTEROCOCCUS FAECALIS (A)  Final      Susceptibility   Enterococcus faecalis - MIC*    AMPICILLIN <=2 SENSITIVE Sensitive     NITROFURANTOIN <=16 SENSITIVE Sensitive     VANCOMYCIN 1 SENSITIVE Sensitive     * 30,000 COLONIES/mL ENTEROCOCCUS FAECALIS    Labs: CBC: Recent Labs  Lab 10/04/22 0602  WBC 10.2  HGB 12.2  HCT 37.5  MCV 91.0  PLT 231   Basic Metabolic Panel: Recent Labs  Lab 10/04/22 1517 10/04/22 1922 10/05/22 0409 10/05/22 0802 10/05/22 1204  NA 141 142 145 142 143  K 3.7 3.7 3.6 3.9 3.6  CL 113* 115* 116* 114* 114*  CO2 24 19* 22 22 23   GLUCOSE 258* 172* 129* 76 132*  BUN 26* 22 18 15 14   CREATININE 0.86 0.89 0.73 0.62 0.62  CALCIUM 8.2* 8.1* 8.4* 8.1* 8.3*   Liver Function Tests: No results for input(s): "AST", "ALT", "ALKPHOS", "BILITOT", "PROT", "ALBUMIN" in the last 168 hours. CBG: Recent Labs  Lab 10/04/22 2100 10/04/22 2300 10/05/22 0012 10/05/22 0829 10/05/22 1142  GLUCAP 152* 231* 256* 72 124*    Discharge time spent: {LESS THAN/GREATER THAN:26388} 30 minutes.  Signed: 10/07/22, MD [facility] 10/05/2022

## 2022-10-05 NOTE — Telephone Encounter (Signed)
Oral Oncology Patient Advocate Encounter   Received notification re-enrollment for assistance for Promacta through [employer] has been approved. Patient may continue to receive their medication at $0 from this program.    [employer]' phone number [phone].   Effective dates: 01.01.24 through 12.31.24   Ardeen Fillers, CPhT Oncology Pharmacy Patient Advocate  [facility]  [phone] (phone) [phone] (fax) 10/05/2022 3:37 PM

## 2022-10-05 NOTE — Discharge Instructions (Signed)

## 2022-10-05 NOTE — Care Management CC44 (Signed)
Condition Code 44 Documentation Completed  Patient Details  Name: Madeline [name] MRN: [id number] Date of Birth: 1934-07-31   Condition Code 44 given:   Provided to patient and family Patient signature on Condition Code 48 notice: Family signature   Documentation of 2 MD's agreement:    Code 44 added to claim:       Tempie Hoist, LCSWA 10/05/2022, 12:47 PM

## 2022-10-05 NOTE — Inpatient Diabetes Management (Addendum)
Inpatient Diabetes Program Recommendations  AACE/ADA: New Consensus Statement on Inpatient Glycemic Control   Target Ranges:  Prepandial:   less than 140 mg/dL      Peak postprandial:   less than 180 mg/dL (1-2 hours)      Critically ill patients:  140 - 180 mg/dL    Latest Reference Range & Units 10/05/22 04:09  Glucose 70 - 99 mg/dL 250 (H)    Latest Reference Range & Units 10/04/22 11:58 10/04/22 12:57 10/04/22 14:32 10/04/22 17:18 10/04/22 18:57 10/04/22 21:00 10/04/22 23:00 10/05/22 00:12  Glucose-Capillary 70 - 99 mg/dL 539 (H) 767 (H) 341 (H) 167 (H) 188 (H) 152 (H) 231 (H) 256 (H)   Review of Glycemic Control  Diabetes history: DM1 (does not make any insulin; requires basal, meal coverage, and correction insulin) Outpatient Diabetes medications: Med[employer] insulin pump Current orders for Inpatient glycemic control: Semglee 15 units Q24H, Novolog 0-9 units TID with meals, Novolog 0-5 units QHS, Novolog 3 units TID with meals  Inpatient Diabetes Program Recommendations:    Outpatient DM: At time of discharge, please provide Rx for Lantus (vials; #30080) and insulin syringes (#93790). Patient's son plans to call pump company to trouble shoot the pump once patient is home and has new insulin reservoir and tubing. If patient is working properly, would have patient restart insulin pump today around 7:00-8:00 pm. If pump is not working correctly, would recommend Lantus 14 units QHS, Novolog 1 unit for every 12 grams of carbs, and Novolog 1 unit for every 65 mg/dl above target of 240 mg/dl.  NOTE: Patient has DM1, uses a Medtronic insulin pump outpatient, and was admitted with DKA which was treated with IV insulin and was transitioned to SQ insulin last night. Patient received Semglee 15 units at 22:03 pm on 10/04/22. Per chart, patient sees Dr. Tedd Sias and was last seen on 09/24/22. Per office note on 09/24/22 the following should be insulin pump settings: Basal settings 12 AM 0.45 units/hr 6  am 0.925 units/hr 8 am 0.6 units/hr 5 pm 0.7 units/hr 24-hr basal = 14.85 units NovoLog   Bolus settings I:C 12AM 15, 11AM 12 Sensitivity 65 Target 100-120 Active insulin time 5 hrs   Per H&P on 10/04/22 by Dr. Clyde Lundborg, "per patient and her husband at the bedside, she has continuous glucose reader that has been alarming her for high blood glucose. Patient states she has an insulin pump but feels like it has not been working.  Contrary to triage note patient denies dosing herself any insulin prior to coming to ED." Will plan to talk with patient today.   Addendum 10/05/22@12 :71- Spoke with patient and her son at bedside. Patient reported that she has had her current insulin pump "for a long time" that she had not called the 1-800 number on pump to troubleshoot the pump yet. Patient states that she changes out her infusion site every 4 days and that she had not changed it in the prior 48 hours prior to hospital admission. Patient reports that her sugar started going up on Saturday and she was not able to give herself an insulin injection SQ once her sugar was high and wasn't coming down with the insulin pump because she did not have any insulin syringes at home.  Patient reports that her sugar usually runs fairly good at home. Patient's son states that he helps patient at times with inserting a new infusion site if he is at home when she changes it out. Encouraged patient's son to call  1-800 number on pump to troubleshoot pump. Patient's son called and they were not able to trouble shoot the pump because it needs to have insulin reservoir (with insulin) and tubing connected to do the test. Patient does not have any extra pump supplies here at the hospital. Patient's son states he will plan to help patient at home to fill up insulin reservoir and new tubing and then call pump company back to make sure the pump is working properly. Discussed that patient received Semglee 15 units at 22:03 last night which will  work for 24 hours. Explained that if pump is working, would recommend to wait until 7-8:00 this evening to restart the pump and could use SQ insulin for meal coverage and correction at home if needed prior to pump restart. If pump is not working, explained that she would need to take once a day basal insulin along with Novolog for meal coverage and correction. Informed patient and her son that  I would recommend to send her home with a basal insulin and insulin syringes to have on hand in case pump can not be restarted and also to have on hand in case of pump failure in the future. Patient and son verbalized understanding and agreeable. Encouraged patient to stay in contact with Dr. Pricilla Handler office and to reach out to them if pump is not working as she may need to get a new pump. Communicated with Dr. Blake Divine and Merry Proud, RN regarding conversation with patient and recommendation to discharge with basal insulin and insulin syringes. Per pharmacy check, Lantus insulin is preferred basal insulin with insurance and copay is $0.   Thanks, Orlando Penner, RN, MSN, CDCES Diabetes Coordinator Inpatient Diabetes Program [phone] (Team Pager from 8am to 5pm)

## 2022-10-05 NOTE — Progress Notes (Signed)
Nutrition Brief Note  RD consulted for assessment of nutritional requirements/ status.   Wt Readings from Last 15 Encounters:  10/04/22 46.3 kg  08/26/22 49.4 kg  06/03/22 49.6 kg  04/22/22 52.3 kg  03/04/22 53.4 kg  01/12/22 51.3 kg  12/25/21 52.4 kg  12/22/21 53.2 kg  12/16/21 57.6 kg  12/14/21 52 kg  12/09/21 52 kg  12/23/20 51.3 kg  12/09/20 51.4 kg  01/31/20 51.7 kg  08/07/19 52 kg   Pt with medical history significant of type 1 diabetes on insulin pump, hypertension, hyperlipidemia, GERD, hypothyroidism, depression, pacemaker placement due to third-degree AV block, CAD, tobacco abuse, thrombocytopenia, dCHF, who presents with elevated blood sugar.  Pt admitted with DKA.   Pt unavailable at time of visit. Pt undergoing procedure with IV team at time of visit. RD unable to obtain further nutrition-related history or complete nutrition-focused physical exam at this time.    Case discussed with RN. Per MD notes, plan for discharge today. RD provided "Carb Counting for Patients with Diabetes" handout from [facility] Nutrition Care Manual.   Per DM coordinator notes, there is question is pt is experiencing insulin pump malfunction; DM coordinator encouraged pt to contact [employer] to troubleshoot.   Current diet order is carb modified, patient is consuming approximately n/a% of meals at this time. Labs and medications reviewed.   No nutrition interventions warranted at this time. If nutrition issues arise, please consult RD.   Levada Schilling, RD, LDN, CDCES Registered Dietitian II Certified Diabetes Care and Education Specialist Please refer to [facility] for RD and/or RD on-call/weekend/after hours pager

## 2022-10-05 NOTE — TOC Benefit Eligibility Note (Signed)
Patient Product/process development scientist completed.    The patient is currently admitted and upon discharge could be taking Lantus Pens.  The current 30 day co-pay is $0.00.   The patient is insured through [employer] Medicare Part D     Roland Earl, CPhT Pharmacy Patient Advocate Specialist [facility] Pharm[facility] Patient Advocate Team Direct Number: [phone]  Fax: [phone]

## 2022-10-05 NOTE — Evaluation (Signed)
Occupational Therapy Evaluation Patient Details Name: Madeline [name] MRN: [id number] DOB: 26-May-1934 Today's Date: 10/05/2022   History of Present Illness presented to ER secondary to elevated blood sugar; admitted for management of DKA (due to insulin pump malfunction).   Clinical Impression   Patient seen for OT evaluation, son and spouse present. Prior to admission, pt was Mod I with ADLs (sponge bathes at the sink) and received assistance from spouse PRN for IADLs. Pt is normally independent with medication managment and cooking, her spouse assists with cleaning and is the primary driver. During evaluation, pt completed bed mobility with Mod I, functional mobility to the bathroom with Min guard and no AD, toilet transfer with supervision using grab bar, sinkside grooming with Mod I, and seated dressing with Mod I. Family present to receive education. At this time, pt does not demonstrate any acute OT needs. Will complete orders.     Recommendations for follow up therapy are one component of a multi-disciplinary discharge planning process, led by the attending physician.  Recommendations may be updated based on patient status, additional functional criteria and insurance authorization.   Follow Up Recommendations  No OT follow up     Assistance Recommended at Discharge PRN  Patient can return home with the following A little help with walking and/or transfers;Assistance with cooking/housework;Assist for transportation;Help with stairs or ramp for entrance    Functional Status Assessment     Equipment Recommendations  None recommended by OT    Recommendations for Other Services       Precautions / Restrictions Precautions Precautions: Fall Restrictions Weight Bearing Restrictions: No      Mobility Bed Mobility Overal bed mobility: Modified Independent             General bed mobility comments: for supine<>sit    Transfers Overall transfer level: Needs  assistance Equipment used: None Transfers: Sit to/from Stand Sit to Stand: Supervision           General transfer comment: STS from EOB, STS from toilet using grab bar      Balance Overall balance assessment: Needs assistance Sitting-balance support: No upper extremity supported, Feet supported Sitting balance-Leahy Scale: Good     Standing balance support: No upper extremity supported Standing balance-Leahy Scale: Fair                             ADL either performed or assessed with clinical judgement   ADL Overall ADL's : Modified independent                                             Vision Baseline Vision/History: 1 Wears glasses (reading) Patient Visual Report: No change from baseline       Perception     Praxis      Pertinent Vitals/Pain Pain Assessment Pain Assessment: No/denies pain     Hand Dominance Right   Extremity/Trunk Assessment Upper Extremity Assessment Upper Extremity Assessment: Overall WFL for tasks assessed   Lower Extremity Assessment Lower Extremity Assessment: Overall WFL for tasks assessed   Cervical / Trunk Assessment Cervical / Trunk Assessment: Normal   Communication Communication Communication: No difficulties   Cognition Arousal/Alertness: Awake/alert Behavior During Therapy: WFL for tasks assessed/performed Overall Cognitive Status: Within Functional Limits for tasks assessed  General Comments       Exercises Other Exercises Other Exercises: OT provided education re: role of OT, OT POC, post acute recs, sitting up for all meals, EOB/OOB mobility with assistance, home/fall safety, discussed installing more sturdy grab bars around toilet for safety (son reported he could make that happen if needed)    Shoulder Instructions      Home Living Family/patient expects to be discharged to:: Private residence Living Arrangements:  Spouse/significant other Available Help at Discharge: Available 24 hours/day;Available PRN/intermittently Type of Home: House Home Access: Stairs to enter [employer] of Steps: 2-3 Entrance Stairs-Rails: Left Home Layout: One level;Laundry or work area in [employer] Shower/Tub: Sponge bathes at baseline   [employer]: Standard (pt has "handrail" she uses near counter to pull up on)     Home Equipment: Conservation officer, nature (2 wheels);Cane - quad          Prior Functioning/Environment Prior Level of Function : Independent/Modified Independent             Mobility Comments: Mod indep with ADLs, household and community mobilization; no home O2; denies fall history in recent six months. ADLs Comments: Mod I with ADLs, spouse assist with IADLs PRN. IND for medication managment and cooking, spouse assists with cleaning, spouse is primary driver        OT Problem List: Impaired balance (sitting and/or standing)      OT Treatment/Interventions:      OT Goals(Current goals can be found in the care plan section) Acute Rehab OT Goals Patient Stated Goal: to go home OT Goal Formulation: All assessment and education complete, DC therapy  OT Frequency:      Co-evaluation              AM-PAC OT "6 Clicks" Daily Activity     Outcome Measure Help from another person eating meals?: None Help from another person taking care of personal grooming?: None Help from another person toileting, which includes using toliet, bedpan, or urinal?: None Help from another person bathing (including washing, rinsing, drying)?: None Help from another person to put on and taking off regular upper body clothing?: None Help from another person to put on and taking off regular lower body clothing?: None 6 Click Score: 24   End of Session Nurse Communication: Mobility status  Activity Tolerance: Patient tolerated treatment well Patient left: in bed;with call bell/phone within  reach;with bed alarm set;with family/visitor present  OT Visit Diagnosis: Unsteadiness on feet (R26.81)                Time: [id number] OT Time Calculation (min): 12 min Charges:  OT General Charges $OT Visit: 1 Visit OT Evaluation $OT Eval Low Complexity: 1 Low  [facility] MS, OTR/L ascom [phone]  10/05/22, 5:30 PM

## 2022-10-05 NOTE — Evaluation (Signed)
Physical Therapy Evaluation Patient Details Name: Madeline [name] MRN: [id number] DOB: [birth year] Today's Date: 10/05/2022  History of Present Illness  presented to ER secondary to elevated blood sugar; admitted for management of DKA (due to insulin pump malfunction).  Clinical Impression  Patient sleeping soundly in bed upon arrival to room; awakens to voice/soft touch.  Oriented to basic information; follows commands and agreeable to participation with session.  Denies pain.  Bilat UE/LE strength and ROM grossly symmetrical and WFL; no focal weakness appreciated.  Able to complete bed mobility with mod indep; sit/stand, basic transfers and gait (50') without assist device, cga/min assist.  Gait pattern demonstrates reciprocal stepping pattern with fair step height/length, mild sway with head turns; narrowed BOS.  Completes additional 150' with RW, cga/close sup-improved safety/stability with use of RW; increased cadence and overall gait mechanics.  Do recommend continued use of RW at this time; patient/family in agreement. Would benefit from skilled PT to address above deficits and promote optimal return to PLOF.; Recommend transition to HHPT upon discharge from acute hospitalization.      Recommendations for follow up therapy are one component of a multi-disciplinary discharge planning process, led by the attending physician.  Recommendations may be updated based on patient status, additional functional criteria and insurance authorization.  Follow Up Recommendations Home health PT      Assistance Recommended at Discharge PRN  Patient can return home with the following  A little help with walking and/or transfers;A little help with bathing/dressing/bathroom    Equipment Recommendations  (has equip needed)  Recommendations for Other Services       Functional Status Assessment Patient has had a recent decline in their functional status and demonstrates the ability to make significant  improvements in function in a reasonable and predictable amount of time.     Precautions / Restrictions Precautions Precautions: Fall Restrictions Weight Bearing Restrictions: No      Mobility  Bed Mobility Overal bed mobility: Modified Independent                  Transfers Overall transfer level: Needs assistance Equipment used: Rolling walker (2 wheels), None Transfers: Sit to/from Stand Sit to Stand: Min guard           General transfer comment: sit/stand with and without assist device, cga/min assist; does require UE support for lift off and initial stabilization    Ambulation/Gait Ambulation/Gait assistance: Min guard Gait Distance (Feet): 50 Feet Assistive device: None         General Gait Details: reciprocal stepping pattern with fair step height/length, mild sway with head turns; narrowed BOS  Stairs            Wheelchair Mobility    Modified Rankin (Stroke Patients Only)       Balance Overall balance assessment: Needs assistance Sitting-balance support: No upper extremity supported, Feet supported Sitting balance-Leahy Scale: Good     Standing balance support: No upper extremity supported Standing balance-Leahy Scale: Fair                               Pertinent Vitals/Pain Pain Assessment Pain Assessment: No/denies pain    Home Living Family/patient expects to be discharged to:: Private residence Living Arrangements: Spouse/significant other Available Help at Discharge: Available 24 hours/day;Available PRN/intermittently Type of Home: House Home Access: Stairs to enter Entrance Stairs-Rails: Left Entrance Stairs-Number of Steps: 2-3   Home Layout: One level;Laundry or work area  in basement Home Equipment: None;Rolling Walker (2 wheels)      Prior Function Prior Level of Function : Independent/Modified Independent             Mobility Comments: Mod indep with ADLs, household and community mobilization;  no home O2; denies fall history in recent six months.       Hand Dominance   Dominant Hand: Right    Extremity/Trunk Assessment   Upper Extremity Assessment Upper Extremity Assessment: Overall WFL for tasks assessed    Lower Extremity Assessment Lower Extremity Assessment: Overall WFL for tasks assessed (grossly 4/5 throughout)       Communication   Communication: No difficulties  Cognition Arousal/Alertness: Awake/alert Behavior During Therapy: WFL for tasks assessed/performed Overall Cognitive Status: Within Functional Limits for tasks assessed                                          General Comments      Exercises Other Exercises Other Exercises: 150' with RW, cga/close sup-improved safety/stability with use of RW; increased cadence and overall gait mechanics.  Do recomment continued use of RW at this time; patient/family in agreement.   Assessment/Plan    PT Assessment Patient needs continued PT services  PT Problem List Decreased activity tolerance;Decreased balance;Decreased mobility;Decreased knowledge of use of DME;Decreased safety awareness;Decreased knowledge of precautions;Cardiopulmonary status limiting activity       PT Treatment Interventions DME instruction;Gait training;Functional mobility training;Stair training;Therapeutic activities;Therapeutic exercise;Balance training;Patient/family education    PT Goals (Current goals can be found in the Care Plan section)  Acute Rehab PT Goals Patient Stated Goal: to return home PT Goal Formulation: With patient/family Time For Goal Achievement: 10/19/22 Potential to Achieve Goals: Good    Frequency Min 2X/week     Co-evaluation               AM-PAC PT "6 Clicks" Mobility  Outcome Measure Help needed turning from your back to your side while in a flat bed without using bedrails?: None Help needed moving from lying on your back to sitting on the side of a flat bed without using  bedrails?: None Help needed moving to and from a bed to a chair (including a wheelchair)?: A Little Help needed standing up from a chair using your arms (e.g., wheelchair or bedside chair)?: A Little Help needed to walk in hospital room?: A Little Help needed climbing 3-5 steps with a railing? : A Little 6 Click Score: 20    End of Session Equipment Utilized During Treatment: Gait belt Activity Tolerance: Patient tolerated treatment well Patient left: with call bell/phone within reach;in bed;with bed alarm set;with family/visitor present Nurse Communication: Mobility status PT Visit Diagnosis: Difficulty in walking, not elsewhere classified (R26.2);Muscle weakness (generalized) (M62.81)    Time: 2956-2130 PT Time Calculation (min) (ACUTE ONLY): 17 min   Charges:   PT Evaluation $PT Eval Low Complexity: 1 Low         Patrece Tallie H. Manson Passey, PT, DPT, NCS 10/05/22, 3:26 PM [phone]

## 2022-10-06 NOTE — Discharge Summary (Signed)
Physician Discharge Summary   Patient: Madeline [name] MRN: [id number] DOB: 03-18-1934  Admit date:     10/04/2022  Discharge date: 10/05/2022  Discharge Physician: Kathlen Mody   PCP: Marguarite Arbour, MD   Recommendations at discharge:  Please follow up with PCP , in one week.  Please follow up with cbc and bmp in one week.   Discharge Diagnoses: Principal Problem:   DKA (diabetic ketoacidosis) (HCC) Active Problems:   Diabetes mellitus without complication (HCC)   Hypotension   CAD (coronary artery disease)   Dyslipidemia   Thrombocytopenia (HCC)   Hypothyroidism   COPD, moderate (HCC)   AKI (acute kidney injury) (HCC)   Chronic diastolic CHF (congestive heart failure) (HCC)   Depression   Tobacco abuse   Protein-calorie malnutrition, moderate [facility])    Hospital Course:  Madeline [name] is a 88 y.o. female with medical history significant of type 1 diabetes on insulin pump, hypertension, hyperlipidemia, GERD, hypothyroidism, depression, pacemaker placement due to third-degree AV block, CAD, tobacco abuse, thrombocytopenia, dCHF, who presents with elevated blood sugar.  She was admitted for DKA.   Assessment and Plan: DKA (diabetic ketoacidosis) (HCC): blood sugar 670 and AG 19.  Beta hydroxybutyric acid 5.08.  Likely due to insulin pump malfunction.  DKA resolved.   she was discharged. On sq lantus and SSI.  She was discharged to follow up with endocrinologist.   Diabetes mellitus without complication-type I DM: Recent A1c 8.2, poorly controlled.  Patient is on insulin pump, which is likely not working appropriately. DM co ordinator consulted and recommendations given.  She was discharged   Hypotension: Blood pressure 78/40 which improved to 108/45.  No source of infection identified.  BP parameters are optimal.    CAD (coronary artery disease) -Lipitor   Dyslipidemia -Lipitor   Thrombocytopenia (HCC): Platelet 231 -Follow-up with CBC -Patient is not  taking Promacta   Hypothyroidism -Synthroid   COPD, moderate (HCC): Stable -Bronchodilators   AKI (acute kidney injury) (HCC): Mild AKI.   resolved.    Chronic diastolic CHF (congestive heart failure) (HCC): 2D echo on 12/25/2021 showed EF of 60 to 65% with grade 1 diastolic dysfunction.  Patient does not have leg edema JVD.  No oxygen desaturation.  BNP 168.  CHF seem to be compensated. -Watch volume status closely   Depression -Continue home medications   Tobacco abuse -Nicotine patch   Protein-calorie malnutrition, moderate: BMI 18.66 and BW 46.3 kg -consult to nutrition     Consultants: none.  Procedures performed: none.   Disposition: Home Diet recommendation:  Discharge Diet Orders (From admission, onward)     Start     Ordered   10/05/22 0000  Diet - low sodium heart healthy        10/05/22 1239           Carb modified diet DISCHARGE MEDICATION: Allergies as of 10/05/2022       Reactions   Darvon    "FEEL SICK"   Propoxyphene Nausea Only, Nausea And Vomiting   Sulfa Antibiotics Nausea Only, Nausea And Vomiting   "FEEL SICK"         Medication List     STOP taking these medications    eltrombopag 25 MG tablet Commonly known as: PROMACTA   gabapentin 100 MG capsule Commonly known as: NEURONTIN       TAKE these medications    acetaminophen 325 MG tablet Commonly known as: TYLENOL Take 1-2 tablets (325-650 mg total) by mouth every 6 (  six) hours as needed for mild pain or moderate pain.   atorvastatin 20 MG tablet Commonly known as: LIPITOR Take 1 tablet (20 mg total) by mouth daily at 6 PM.   cetirizine 10 MG tablet Commonly known as: ZYRTEC Take 10 mg by mouth daily.   citalopram 20 MG tablet Commonly known as: CELEXA Take 20 mg by mouth daily.   Contour Next Test test strip Generic drug: glucose blood USE 1 STRIP TO CHECK BLOOD SUGAR EIGHT TO NINE TIMES DAILY.   FreeStyle Libre 2 Reader [place] Use to monitor blood glucose.    insulin glargine 100 UNIT/ML injection Commonly known as: LANTUS Inject 0.15 mLs (15 Units total) into the skin at bedtime. Please do not use Lantus if the insulin pump is working.   insulin pump Soln Inject 1 each into the skin continuous. Used with Novolog insulin.  Please do not use Lantus if the pump is working. What changed: additional instructions   Insulin Syringes (Disposable) U-100 0.3 ML Misc 100 each by Does not apply route daily.   Lancets 28G Misc Use as instructed. 7-8 times daily.   levothyroxine 75 MCG tablet Commonly known as: SYNTHROID Take by mouth.   lisinopril 20 MG tablet Commonly known as: ZESTRIL Take 1 tablet (20 mg total) by mouth daily.   multivitamin with minerals Tabs tablet Take 1 tablet by mouth daily.   nicotine 21 mg/24hr patch Commonly known as: NICODERM CQ - dosed in mg/24 hours Place 1 patch (21 mg total) onto the skin daily.   NovoLOG 100 UNIT/ML injection Generic drug: insulin aspart Inject 0-50 Units into the skin continuous. Up to 50 units/ day via pump What changed: Another medication with the same name was added. Make sure you understand how and when to take each.   insulin aspart 100 UNIT/ML injection Commonly known as: NovoLOG CBG 70 - 120: 0 units  CBG 121 - 150: 1 unit  CBG 151 - 200: 2 units  CBG 201 - 250: 3 units  CBG 251 - 300: 5 units  CBG 301 - 350: 7 units  CBG 351 - 400: 9 units What changed: You were already taking a medication with the same name, and this prescription was added. Make sure you understand how and when to take each.        Follow-up Information     Marguarite Arbour, MD. Go in 1 week(s).   Specialty: Internal Medicine Why: office will call patient to schedule a follow-up appt. Contact information: [facility] [address] [place] [address] [phone]                Discharge Exam: Ceasar Mons Weights   10/04/22 0559  Weight: 46.3 kg   General exam: Appears  calm and comfortable  Respiratory system: Clear to auscultation. Respiratory effort normal. Cardiovascular system: S1 & S2 heard, RRR. No JVD, murmurs, rubs, gallops or clicks. No pedal edema. Gastrointestinal system: Abdomen is nondistended, soft and nontender. No organomegaly or masses felt. Normal bowel sounds heard. Central nervous system: Alert and oriented. No focal neurological deficits. Extremities: Symmetric 5 x 5 power. Skin: No rashes, lesions or ulcers Psychiatry: Judgement and insight appear normal. Mood & affect appropriate.    Condition at discharge: fair  The results of significant diagnostics from this hospitalization (including imaging, microbiology, ancillary and laboratory) are listed below for reference.   Imaging Studies: CUP PACEART REMOTE DEVICE CHECK  Result Date: 09/11/2022 Scheduled remote reviewed. Normal device function.  Next  remote 91 days. [place]   Microbiology: Results for orders placed or performed during the hospital encounter of 12/15/21  Resp Panel by RT-PCR (Flu A&B, Covid) Nasopharyngeal Swab     Status: None   Collection Time: 12/15/21  2:15 PM   Specimen: Nasopharyngeal Swab; Nasopharyngeal(NP) swabs in vial transport medium  Result Value Ref Range Status   SARS Coronavirus 2 by RT PCR NEGATIVE NEGATIVE Final    Comment: (NOTE) SARS-CoV-2 target nucleic acids are NOT DETECTED.  The SARS-CoV-2 RNA is generally detectable in upper respiratory specimens during the acute phase of infection. The lowest concentration of SARS-CoV-2 viral copies this assay can detect is 138 copies/mL. A negative result does not preclude SARS-Cov-2 infection and should not be used as the sole basis for treatment or other patient management decisions. A negative result may occur with  improper specimen collection/handling, submission of specimen other than nasopharyngeal swab, presence of viral mutation(s) within the areas targeted by this assay, and inadequate number  of viral copies(<138 copies/mL). A negative result must be combined with clinical observations, patient history, and epidemiological information. The expected result is Negative.  Fact Sheet for Patients:  BloggerCourse.com  Fact Sheet for Healthcare Providers:  SeriousBroker.it  This test is no t yet approved or cleared by the [place] FDA and  has been authorized for detection and/or diagnosis of SARS-CoV-2 by FDA under an Emergency Use Authorization (EUA). This EUA will remain  in effect (meaning this test can be used) for the duration of the COVID-19 declaration under Section 564(b)(1) of the Act, 21 U.S.C.section 360bbb-3(b)(1), unless the authorization is terminated  or revoked sooner.       Influenza A by PCR NEGATIVE NEGATIVE Final   Influenza B by PCR NEGATIVE NEGATIVE Final    Comment: (NOTE) The Xpert Xpress SARS-CoV-2/FLU/RSV plus assay is intended as an aid in the diagnosis of influenza from Nasopharyngeal swab specimens and should not be used as a sole basis for treatment. Nasal washings and aspirates are unacceptable for Xpert Xpress SARS-CoV-2/FLU/RSV testing.  Fact Sheet for Patients: BloggerCourse.com  Fact Sheet for Healthcare Providers: SeriousBroker.it  This test is not yet approved or cleared by the [place] FDA and has been authorized for detection and/or diagnosis of SARS-CoV-2 by FDA under an Emergency Use Authorization (EUA). This EUA will remain in effect (meaning this test can be used) for the duration of the COVID-19 declaration under Section 564(b)(1) of the Act, 21 U.S.C. section 360bbb-3(b)(1), unless the authorization is terminated or revoked.  Performed at [facility], [address] [place], [place] [address]   MRSA Next Gen by PCR, Nasal     Status: None   Collection Time: 12/16/21  7:00 AM   Specimen: Nasal  Mucosa; Nasal Swab  Result Value Ref Range Status   MRSA by PCR Next Gen NOT DETECTED NOT DETECTED Final    Comment: (NOTE) The GeneXpert MRSA Assay (FDA approved for NASAL specimens only), is one component of a comprehensive MRSA colonization surveillance program. It is not intended to diagnose MRSA infection nor to guide or monitor treatment for MRSA infections. Test performance is not FDA approved in patients less than 82 years old. Performed at [facility], [address] [place], [place] [address]   Urine Culture     Status: Abnormal   Collection Time: 12/17/21 12:00 PM   Specimen: Urine, Random  Result Value Ref Range Status   Specimen Description   Final    URINE, RANDOM Performed at  [facility], [address] [place], [place] [address]    Special Requests   Final    NONE Performed at [facility], [address] [place], [place] [address]    Culture 30,000 COLONIES/mL ENTEROCOCCUS FAECALIS (A)  Final   Report Status 12/19/2021 FINAL  Final   Organism ID, Bacteria ENTEROCOCCUS FAECALIS (A)  Final      Susceptibility   Enterococcus faecalis - MIC*    AMPICILLIN <=2 SENSITIVE Sensitive     NITROFURANTOIN <=16 SENSITIVE Sensitive     VANCOMYCIN 1 SENSITIVE Sensitive     * 30,000 COLONIES/mL ENTEROCOCCUS FAECALIS    Labs: CBC: Recent Labs  Lab 10/04/22 0602  WBC 10.2  HGB 12.2  HCT 37.5  MCV 91.0  PLT 231   Basic Metabolic Panel: Recent Labs  Lab 10/04/22 1517 10/04/22 1922 10/05/22 0409 10/05/22 0802 10/05/22 1204  NA 141 142 145 142 143  K 3.7 3.7 3.6 3.9 3.6  CL 113* 115* 116* 114* 114*  CO2 24 19* 22 22 23   GLUCOSE 258* 172* 129* 76 132*  BUN 26* 22 18 15 14   CREATININE 0.86 0.89 0.73 0.62 0.62  CALCIUM 8.2* 8.1* 8.4* 8.1* 8.3*   Liver Function Tests: No results for input(s): "AST", "ALT", "ALKPHOS", "BILITOT", "PROT", "ALBUMIN" in the last 168 hours. CBG: Recent Labs  Lab 10/04/22 2100 10/04/22 2300  10/05/22 0012 10/05/22 0829 10/05/22 1142  GLUCAP 152* 231* 256* 72 124*    Discharge time spent: 36 minutes.   Signed: Kathlen ModyVijaya Darwin Guastella, MD [facility] 10/06/2022

## 2022-11-24 ENCOUNTER — Other Ambulatory Visit: Payer: Self-pay | Admitting: Internal Medicine

## 2022-11-24 DIAGNOSIS — Z1231 Encounter for screening mammogram for malignant neoplasm of breast: Secondary | ICD-10-CM

## 2022-11-26 ENCOUNTER — Encounter (HOSPITAL_COMMUNITY): Payer: Self-pay | Admitting: Internal Medicine

## 2022-11-26 ENCOUNTER — Inpatient Hospital Stay: Payer: HMO

## 2022-11-26 ENCOUNTER — Inpatient Hospital Stay: Payer: HMO | Attending: Hematology | Admitting: Hematology

## 2022-11-26 DIAGNOSIS — D696 Thrombocytopenia, unspecified: Secondary | ICD-10-CM | POA: Diagnosis not present

## 2022-11-26 DIAGNOSIS — D693 Immune thrombocytopenic purpura: Secondary | ICD-10-CM | POA: Diagnosis not present

## 2022-11-26 DIAGNOSIS — R197 Diarrhea, unspecified: Secondary | ICD-10-CM | POA: Insufficient documentation

## 2022-11-26 DIAGNOSIS — R911 Solitary pulmonary nodule: Secondary | ICD-10-CM | POA: Insufficient documentation

## 2022-11-26 LAB — CBC WITH DIFFERENTIAL/PLATELET
Abs Immature Granulocytes: 0.02 10*3/uL (ref 0.00–0.07)
Basophils Absolute: 0.1 10*3/uL (ref 0.0–0.1)
Basophils Relative: 1 %
Eosinophils Absolute: 0.1 10*3/uL (ref 0.0–0.5)
Eosinophils Relative: 2 %
HCT: 35.1 % — ABNORMAL LOW (ref 36.0–46.0)
Hemoglobin: 11.5 g/dL — ABNORMAL LOW (ref 12.0–15.0)
Immature Granulocytes: 0 %
Lymphocytes Relative: 33 %
Lymphs Abs: 2.1 10*3/uL (ref 0.7–4.0)
MCH: 30.3 pg (ref 26.0–34.0)
MCHC: 32.8 g/dL (ref 30.0–36.0)
MCV: 92.6 fL (ref 80.0–100.0)
Monocytes Absolute: 0.5 10*3/uL (ref 0.1–1.0)
Monocytes Relative: 7 %
Neutro Abs: 3.7 10*3/uL (ref 1.7–7.7)
Neutrophils Relative %: 57 %
Platelets: 145 10*3/uL — ABNORMAL LOW (ref 150–400)
RBC: 3.79 MIL/uL — ABNORMAL LOW (ref 3.87–5.11)
RDW: 13.5 % (ref 11.5–15.5)
WBC: 6.4 10*3/uL (ref 4.0–10.5)
nRBC: 0 % (ref 0.0–0.2)

## 2022-11-26 LAB — COMPREHENSIVE METABOLIC PANEL
ALT: 25 U/L (ref 0–44)
AST: 30 U/L (ref 15–41)
Albumin: 3.4 g/dL — ABNORMAL LOW (ref 3.5–5.0)
Alkaline Phosphatase: 79 U/L (ref 38–126)
Anion gap: 7 (ref 5–15)
BUN: 11 mg/dL (ref 8–23)
CO2: 25 mmol/L (ref 22–32)
Calcium: 8.1 mg/dL — ABNORMAL LOW (ref 8.9–10.3)
Chloride: 103 mmol/L (ref 98–111)
Creatinine, Ser: 0.63 mg/dL (ref 0.44–1.00)
GFR, Estimated: >60 mL/min (ref >60)
Glucose, Bld: 273 mg/dL — ABNORMAL HIGH (ref 70–99)
Potassium: 3.9 mmol/L (ref 3.5–5.1)
Sodium: 135 mmol/L (ref 135–145)
Total Bilirubin: 0.4 mg/dL (ref 0.3–1.2)
Total Protein: 5.9 g/dL — ABNORMAL LOW (ref 6.5–8.1)

## 2022-11-26 LAB — LACTATE DEHYDROGENASE: LDH: 198 U/L — ABNORMAL HIGH (ref 98–192)

## 2022-11-26 NOTE — Patient Instructions (Signed)
[place]  Discharge Instructions  You were seen and examined today by Dr. Delton Coombes.  Dr. Delton Coombes discussed your most recent lab work which revealed that everything looks good.  Continue taking Promacta as prescribed.   Follow-up as scheduled in 3 months with labs.    Thank you for choosing [facility] to provide your oncology and hematology care.   To afford each patient quality time with our provider, please arrive at least 15 minutes before your scheduled appointment time. You may need to reschedule your appointment if you arrive late (10 or more minutes). Arriving late affects you and other patients whose appointments are after yours.  Also, if you miss three or more appointments without notifying the office, you may be dismissed from the clinic at the provider's discretion.    Again, thank you for choosing [facility].  Our hope is that these requests will decrease the amount of time that you wait before being seen by our physicians.   If you have a lab appointment with the [facility] please come in thru the Main Entrance and check in at the main information desk.           _____________________________________________________________  Should you have questions after your visit to [facility], please contact our office at [phone] and follow the prompts.  Our office hours are 8:00 a.m. to 4:30 p.m. Monday - Thursday and 8:00 a.m. to 2:30 p.m. Friday.  Please note that voicemails left after 4:00 p.m. may not be returned until the following business day.  We are closed weekends and all major holidays.  You do have access to a nurse 24-7, just call the main number to the clinic [phone] and do not press any options, hold on the line and a nurse will answer the phone.    For prescription refill requests, have your pharmacy contact our office and allow 72 hours.    Masks are optional in  the cancer centers. If you would like for your care team to wear a mask while they are taking care of you, please let them know. You may have one support person who is at least 87 years old accompany you for your appointments.

## 2022-11-26 NOTE — Progress Notes (Signed)
[address], [place] [id number]   CLINIC:  Medical Oncology/Hematology  PCP:  Idelle Crouch, MD [place] / [place] [phone]  REASON FOR VISIT:  Follow-up for chronic ITP with exacerbations  PRIOR THERAPY: none  CURRENT THERAPY: Promacta 25 mg daily.  INTERVAL HISTORY:  Ms. CARNELLA FRYMAN, a 89 y.o. female, seen for follow-up of immune mediated thrombocytopenia.  She is taking Promacta without any major issues.  She has occasional diarrhea which is stable.  Denies any easy bruising or bleeding.  No fevers or infections noted in the last 3 months.  REVIEW OF SYSTEMS:  Review of Systems  Gastrointestinal:  Positive for diarrhea.  Hematological:  Does not bruise/bleed easily.  All other systems reviewed and are negative.   PAST MEDICAL/SURGICAL HISTORY:  Past Medical History:  Diagnosis Date   Bradycardia    Coronary artery disease    Diabetes mellitus    Dyslipidemia    GERD (gastroesophageal reflux disease)    Hypothyroid    Presence of permanent cardiac pacemaker 12/16/2018   Medtronic dual lead pacemaker   Past Surgical History:  Procedure Laterality Date   APPENDECTOMY     BREAST BIOPSY Right 1967   EXCISIONAL - NEG   CHOLECYSTECTOMY     CORONARY STENT INTERVENTION     INSERT / REPLACE / REMOVE PACEMAKER  12/16/2018   no surgical hx     PACEMAKER IMPLANT N/A 12/16/2018   Procedure: PACEMAKER IMPLANT;  Surgeon: Evans Lance, MD;  Location: [facility];  Service: Cardiovascular;  Laterality: N/A;    SOCIAL HISTORY:  Social History   Socioeconomic History   Marital status: Married    Spouse name: Not on file   Number of children: Not on file   Years of education: Not on file   Highest education level: Not on file  Occupational History   Not on file  Tobacco Use   Smoking status: Every Day    Packs/day: 1.00    Years: 50.00    Total pack years: 50.00     Types: Cigarettes   Smokeless tobacco: Never  Vaping Use   Vaping Use: Never used  Substance and Sexual Activity   Alcohol use: No   Drug use: No   Sexual activity: Not on file  Other Topics Concern   Not on file  Social History Narrative   Not on file   Social Determinants of Health   Financial Resource Strain: Not on file  Food Insecurity: No Food Insecurity (09/02/2020)   Hunger Vital Sign    Worried About Running Out of Food in the Last Year: Never true    Ran Out of Food in the Last Year: Never true  Transportation Needs: No Transportation Needs (09/02/2020)   PRAPARE - Hydrologist (Medical): No    Lack of Transportation (Non-Medical): No  Physical Activity: Not on file  Stress: Not on file  Social Connections: Not on file  Intimate Partner Violence: Not on file    FAMILY HISTORY:  Family History  Problem Relation Age of Onset   Heart Problems Mother    Other Father        UNK   Cancer Sister        BREAST   Breast cancer Sister 47   Cancer Sister        BREAST   Breast cancer Sister 52  Cancer Sister        BREAST   Diabetes Son     CURRENT MEDICATIONS:  Current Outpatient Medications  Medication Sig Dispense Refill   acetaminophen (TYLENOL) 325 MG tablet Take 1-2 tablets (325-650 mg total) by mouth every 6 (six) hours as needed for mild pain or moderate pain.     atorvastatin (LIPITOR) 20 MG tablet Take 1 tablet (20 mg total) by mouth daily at 6 PM. 90 tablet 3   cetirizine (ZYRTEC) 10 MG tablet Take 10 mg by mouth daily.     citalopram (CELEXA) 20 MG tablet Take 20 mg by mouth daily.     Continuous Blood Gluc Receiver (FREESTYLE LIBRE 2 READER) DEVI Use to monitor blood glucose.     CONTOUR NEXT TEST test strip USE 1 STRIP TO CHECK BLOOD SUGAR EIGHT TO NINE TIMES DAILY.     insulin aspart (NOVOLOG) 100 UNIT/ML injection CBG 70 - 120: 0 units  CBG 121 - 150: 1 unit  CBG 151 - 200: 2 units  CBG 201 - 250: 3 units  CBG 251  - 300: 5 units  CBG 301 - 350: 7 units  CBG 351 - 400: 9 units 10 mL 11   insulin glargine (LANTUS) 100 UNIT/ML injection Inject 0.15 mLs (15 Units total) into the skin at bedtime. Please do not use Lantus if the insulin pump is working. 10 mL 3   Insulin Human (INSULIN PUMP) SOLN Inject 1 each into the skin continuous. Used with Novolog insulin.  Please do not use Lantus if the pump is working.     Insulin Syringes, Disposable, U-100 0.3 ML MISC 100 each by Does not apply route daily. 100 each 2   Lancets 28G MISC Use as instructed. 7-8 times daily.     levothyroxine (SYNTHROID) 75 MCG tablet Take by mouth.     Multiple Vitamin (MULTIVITAMIN WITH MINERALS) TABS tablet Take 1 tablet by mouth daily.     nicotine (NICODERM CQ - DOSED IN MG/24 HOURS) 21 mg/24hr patch Place 1 patch (21 mg total) onto the skin daily. 28 patch 0   NOVOLOG 100 UNIT/ML injection Inject 0-50 Units into the skin continuous. Up to 50 units/ day via pump 10 mL 0   lisinopril (PRINIVIL,ZESTRIL) 20 MG tablet Take 1 tablet (20 mg total) by mouth daily. 30 tablet 1   No current facility-administered medications for this visit.    ALLERGIES:  Allergies  Allergen Reactions   Darvon     "FEEL SICK"   Propoxyphene Nausea Only and Nausea And Vomiting   Sulfa Antibiotics Nausea Only and Nausea And Vomiting    "FEEL SICK"     PHYSICAL EXAM:  Performance status (ECOG): 1 - Symptomatic but completely ambulatory  Vitals:   11/26/22 1505  BP: (!) 117/50  Pulse: 72  Resp: 18  Temp: 98.6 F (37 C)  SpO2: 99%   Wt Readings from Last 3 Encounters:  11/26/22 116 lb 6.4 oz (52.8 kg)  10/04/22 102 lb (46.3 kg)  08/26/22 109 lb (49.4 kg)   Physical Exam Vitals reviewed.  Constitutional:      Appearance: Normal appearance.  Cardiovascular:     Rate and Rhythm: Normal rate and regular rhythm.     Pulses: Normal pulses.     Heart sounds: Normal heart sounds.  Pulmonary:     Effort: Pulmonary effort is normal.      Breath sounds: Normal breath sounds.  Neurological:     General: No focal deficit  present.     Mental Status: She is alert and oriented to person, place, and time.  Psychiatric:        Mood and Affect: Mood normal.        Behavior: Behavior normal.     LABORATORY DATA:  I have reviewed the labs as listed.     Latest Ref Rng & Units 11/26/2022    2:33 PM 10/04/2022    6:02 AM 08/26/2022    1:01 PM  CBC  WBC 4.0 - 10.5 K/uL 6.4  10.2  5.4   Hemoglobin 12.0 - 15.0 g/dL 11.5  12.2  12.4   Hematocrit 36.0 - 46.0 % 35.1  37.5  38.4   Platelets 150 - 400 K/uL 145  231  208       Latest Ref Rng & Units 11/26/2022    2:33 PM 10/05/2022   12:04 PM 10/05/2022    8:02 AM  CMP  Glucose 70 - 99 mg/dL 273  132  76   BUN 8 - 23 mg/dL 11  14  15    Creatinine 0.44 - 1.00 mg/dL 0.63  0.62  0.62   Sodium 135 - 145 mmol/L 135  143  142   Potassium 3.5 - 5.1 mmol/L 3.9  3.6  3.9   Chloride 98 - 111 mmol/L 103  114  114   CO2 22 - 32 mmol/L 25  23  22    Calcium 8.9 - 10.3 mg/dL 8.1  8.3  8.1   Total Protein 6.5 - 8.1 g/dL 5.9     Total Bilirubin 0.3 - 1.2 mg/dL 0.4     Alkaline Phos 38 - 126 U/L 79     AST 15 - 41 U/L 30     ALT 0 - 44 U/L 25         Component Value Date/Time   RBC 3.79 (L) 11/26/2022 1433   MCV 92.6 11/26/2022 1433   MCV 92 12/09/2018 1308   MCH 30.3 11/26/2022 1433   MCHC 32.8 11/26/2022 1433   RDW 13.5 11/26/2022 1433   RDW 12.2 12/09/2018 1308   LYMPHSABS 2.1 11/26/2022 1433   LYMPHSABS 2.0 12/09/2018 1308   MONOABS 0.5 11/26/2022 1433   EOSABS 0.1 11/26/2022 1433   EOSABS 0.2 12/09/2018 1308   BASOSABS 0.1 11/26/2022 1433   BASOSABS 0.0 12/09/2018 1308    DIAGNOSTIC IMAGING:  I have independently reviewed the scans and discussed with the patient. No results found.   ASSESSMENT:  1.  Chronic ITP with exacerbations: Patient had thrombocytopenia since July 2016.  CT abdomen on 02/02/2018 showed normal-sized spleen.  Bone marrow aspiration and biopsy in  09/15/2017 showed hypercellular marrow for age, trilineage hematopoiesis with abundant megakaryocytes with no dysplasia. - Prednisone tapering dose over 5 weeks given on 12/14/2017 with no improvement in platelet count - IVIG (1 g/kg) on 12/23/2017 and 12/24/2017 with peak platelet count of 92, response lasting about 3 weeks -Dexamethasone 40 mg for 4 days on 01/11/2018 with improvement in platelet count of 61. - IVIG 1 g/kg on 12/18/2021 and 12/19/2021 without response. - Nplate 2 mcg/kg on 624THL. - Promacta 25 mg daily started on 12/26/2021.     2.  Right middle lobe lung nodule: -CT of the chest on 08/08/2018 showed right middle lobe lung nodule with one-year follow-up recommended.  - CT chest on 04/16/2022 shows benign lung nodules with no abnormalities.  No further scans needed.  PLAN:  1.  Chronic ITP with exacerbations: - She is  tolerating Promacta very well. - Reviewed labs today which showed platelet count 145.  Hemoglobin 11.5 with normal white count.  LFTs are normal.  LDH is minimally elevated at 198. - Continue Promacta 25 mg daily. - RTC 3 months for follow-up with repeat labs.   2.  Health maintenance: - Mammogram on 12/29/2021 was BI-RADS Category 1.  Orders placed this encounter:  No orders of the defined types were placed in this encounter.     Derek Jack, MD [place] [phone]

## 2022-12-07 DIAGNOSIS — E1043 Type 1 diabetes mellitus with diabetic autonomic (poly)neuropathy: Secondary | ICD-10-CM | POA: Diagnosis not present

## 2022-12-07 DIAGNOSIS — Z794 Long term (current) use of insulin: Secondary | ICD-10-CM | POA: Diagnosis not present

## 2022-12-07 DIAGNOSIS — E1065 Type 1 diabetes mellitus with hyperglycemia: Secondary | ICD-10-CM | POA: Diagnosis not present

## 2022-12-11 ENCOUNTER — Ambulatory Visit: Payer: HMO | Attending: Internal Medicine

## 2022-12-11 DIAGNOSIS — I442 Atrioventricular block, complete: Secondary | ICD-10-CM | POA: Diagnosis not present

## 2022-12-11 LAB — CUP PACEART REMOTE DEVICE CHECK
Battery Remaining Longevity: 16 mo
Battery Voltage: 2.88 V
Brady Statistic AP VP Percent: 0.41 %
Brady Statistic AP VS Percent: 0 %
Brady Statistic AS VP Percent: 99.42 %
Brady Statistic AS VS Percent: 0.17 %
Brady Statistic RA Percent Paced: 0.44 %
Brady Statistic RV Percent Paced: 99.83 %
Date Time Interrogation Session: 20240126041435
Implantable Lead Connection Status: 753985
Implantable Lead Connection Status: 753985
Implantable Lead Implant Date: 20200131
Implantable Lead Implant Date: 20200131
Implantable Lead Location: 753859
Implantable Lead Location: 753860
Implantable Lead Model: 3830
Implantable Lead Model: 5076
Implantable Pulse Generator Implant Date: 20200131
Lead Channel Impedance Value: 285 Ohm
Lead Channel Impedance Value: 342 Ohm
Lead Channel Impedance Value: 399 Ohm
Lead Channel Impedance Value: 437 Ohm
Lead Channel Pacing Threshold Amplitude: 0.5 V
Lead Channel Pacing Threshold Amplitude: 1.5 V
Lead Channel Pacing Threshold Pulse Width: 0.4 ms
Lead Channel Pacing Threshold Pulse Width: 0.4 ms
Lead Channel Sensing Intrinsic Amplitude: 2.625 mV
Lead Channel Sensing Intrinsic Amplitude: 2.625 mV
Lead Channel Sensing Intrinsic Amplitude: 5 mV
Lead Channel Sensing Intrinsic Amplitude: 5 mV
Lead Channel Setting Pacing Amplitude: 1.5 V
Lead Channel Setting Pacing Amplitude: 2.5 V
Lead Channel Setting Pacing Pulse Width: 0.5 ms
Lead Channel Setting Sensing Sensitivity: 4 mV
Zone Setting Status: 755011
Zone Setting Status: 755011

## 2022-12-16 DIAGNOSIS — E1065 Type 1 diabetes mellitus with hyperglycemia: Secondary | ICD-10-CM | POA: Diagnosis not present

## 2022-12-21 NOTE — Progress Notes (Deleted)
Cardiology Office Note   Date:  12/21/2022   ID:  Madeline [name], DOB 01/17/1934, MRN [id number]  PCP:  Idelle Crouch, MD  Cardiologist:   Dorris Carnes, MD   F/U of CAD   History of Present Illness: Madeline [name] is a 89 y.o. female with a history of CAD, s/p inferior STEMI in 2012  She had occlusion of the RCA and underwent PTCA with DES to the prox RCA. It was a right dominant system with dual PDAs. Other vessels had irregularities. She was d/cd home on Brilinta. The pt also has a history of bradycardia and is s/p PPM  I saw the pt in clinic in Jan 2022   Since then she has done OK   Breathing is OK  No CP  No dizziness Did fall in the past few months   Said her head got in front of her feet      I saw the pt in Jan 2023  Outpatient Medications Prior to Visit  Medication Sig Dispense Refill   acetaminophen (TYLENOL) 325 MG tablet Take 1-2 tablets (325-650 mg total) by mouth every 6 (six) hours as needed for mild pain or moderate pain.     atorvastatin (LIPITOR) 20 MG tablet Take 1 tablet (20 mg total) by mouth daily at 6 PM. 90 tablet 3   cetirizine (ZYRTEC) 10 MG tablet Take 10 mg by mouth daily.     citalopram (CELEXA) 20 MG tablet Take 20 mg by mouth daily.     Continuous Blood Gluc Receiver (FREESTYLE LIBRE 2 READER) DEVI Use to monitor blood glucose.     CONTOUR NEXT TEST test strip USE 1 STRIP TO CHECK BLOOD SUGAR EIGHT TO NINE TIMES DAILY.     insulin aspart (NOVOLOG) 100 UNIT/ML injection CBG 70 - 120: 0 units  CBG 121 - 150: 1 unit  CBG 151 - 200: 2 units  CBG 201 - 250: 3 units  CBG 251 - 300: 5 units  CBG 301 - 350: 7 units  CBG 351 - 400: 9 units 10 mL 11   insulin glargine (LANTUS) 100 UNIT/ML injection Inject 0.15 mLs (15 Units total) into the skin at bedtime. Please do not use Lantus if the insulin pump is working. 10 mL 3   Insulin Human (INSULIN PUMP) SOLN Inject 1 each into the skin continuous. Used with Novolog insulin.  Please do not use Lantus if  the pump is working.     Insulin Syringes, Disposable, U-100 0.3 ML MISC 100 each by Does not apply route daily. 100 each 2   Lancets 28G MISC Use as instructed. 7-8 times daily.     levothyroxine (SYNTHROID) 75 MCG tablet Take by mouth.     lisinopril (PRINIVIL,ZESTRIL) 20 MG tablet Take 1 tablet (20 mg total) by mouth daily. 30 tablet 1   Multiple Vitamin (MULTIVITAMIN WITH MINERALS) TABS tablet Take 1 tablet by mouth daily.     nicotine (NICODERM CQ - DOSED IN MG/24 HOURS) 21 mg/24hr patch Place 1 patch (21 mg total) onto the skin daily. 28 patch 0   NOVOLOG 100 UNIT/ML injection Inject 0-50 Units into the skin continuous. Up to 50 units/ day via pump 10 mL 0   No facility-administered medications prior to visit.     Allergies:   Darvon, Propoxyphene, and Sulfa antibiotics   Past Medical History:  Diagnosis Date   Bradycardia    Coronary artery disease    Diabetes mellitus    Dyslipidemia  GERD (gastroesophageal reflux disease)    Hypothyroid    Presence of permanent cardiac pacemaker 12/16/2018   Medtronic dual lead pacemaker    Past Surgical History:  Procedure Laterality Date   APPENDECTOMY     BREAST BIOPSY Right 1967   EXCISIONAL - NEG   CHOLECYSTECTOMY     CORONARY STENT INTERVENTION     INSERT / REPLACE / REMOVE PACEMAKER  12/16/2018   no surgical hx     PACEMAKER IMPLANT N/A 12/16/2018   Procedure: PACEMAKER IMPLANT;  Surgeon: Evans Lance, MD;  Location: [facility];  Service: Cardiovascular;  Laterality: N/A;     Social History:  The patient  reports that she has been smoking cigarettes. She has a 50.00 pack-year smoking history. She has never used smokeless tobacco. She reports that she does not drink alcohol and does not use drugs.   Family History:  The patient's family history includes Breast cancer (age of onset: 38) in her sister; Breast cancer (age of onset: 15) in her sister; Cancer in her sister, sister, and sister; Diabetes in her son;  Heart Problems in her mother; Other in her father.    ROS:  Please see the history of present illness. All other systems are reviewed and  Negative to the above problem except as noted.    PHYSICAL EXAM: VS:  There were no vitals taken for this visit.  GEN: Thin 89 yo in no acute distress  HEENT: normal  Neck: No JVD    Nobruits   Cardiac: RRR; Gr III/VI systolic m LSB      No LE edema   Respiratory:  clear to auscultation bilaterally, GI: soft, nontender, nondistended, + BS  No hepatomegaly  MS: no deformity Moving all extremities   Skin: warm and dry, no rash Neuro:  Strength and sensation are intact Psych: euthymic mood, full affect   EKG:  EKG ordered today   SR 73   Ventricular   Paced    Lipid Panel    Component Value Date/Time   CHOL 134 08/07/2019 0857   TRIG 51 08/07/2019 0857   HDL 71 08/07/2019 0857   CHOLHDL 1.9 08/07/2019 0857   CHOLHDL 1.7 08/03/2016 1519   VLDL 19 08/03/2016 1519   LDLCALC 51 08/07/2019 0857      Wt Readings from Last 3 Encounters:  11/26/22 116 lb 6.4 oz (52.8 kg)  10/04/22 102 lb (46.3 kg)  08/26/22 109 lb (49.4 kg)      ASSESSMENT AND PLAN:  1  CAD  Remote intervention   Remains symptom free    2  AS  MIld on echo in 2019   Will order repeat to reevaluate   3  HL   LDL in NOv was 57  HDL 71  Continue lipitor    4  Hx bradycardia   S/p PPM     5.  Tob   Counselled again on cessation  She continues to smoke  6  HTN  Good control  7  DM  A1C 7.9  Reviewed diet       F/U in 1 year   Current medicines are reviewed at length with the patient today.  The patient does not have concerns regarding medicines. Signed, Dorris Carnes, MD  12/21/2022 9:25 PM    [facility] [address], [place], [place]  [address] Phone: [phone]; Fax: [phone]

## 2022-12-22 ENCOUNTER — Ambulatory Visit: Payer: HMO | Admitting: Internal Medicine

## 2022-12-28 NOTE — Progress Notes (Signed)
Remote pacemaker transmission.   

## 2022-12-30 ENCOUNTER — Ambulatory Visit
Admission: RE | Admit: 2022-12-30 | Discharge: 2022-12-30 | Disposition: A | Discharge status: Home / Self Care | Payer: HMO | Source: Ambulatory Visit | Attending: Internal Medicine | Admitting: Internal Medicine

## 2022-12-30 DIAGNOSIS — Z1231 Encounter for screening mammogram for malignant neoplasm of breast: Secondary | ICD-10-CM | POA: Insufficient documentation

## 2023-01-11 DIAGNOSIS — E1065 Type 1 diabetes mellitus with hyperglycemia: Secondary | ICD-10-CM | POA: Diagnosis not present

## 2023-01-18 DIAGNOSIS — E1065 Type 1 diabetes mellitus with hyperglycemia: Secondary | ICD-10-CM | POA: Diagnosis not present

## 2023-01-18 DIAGNOSIS — E1043 Type 1 diabetes mellitus with diabetic autonomic (poly)neuropathy: Secondary | ICD-10-CM | POA: Diagnosis not present

## 2023-01-18 DIAGNOSIS — F17200 Nicotine dependence, unspecified, uncomplicated: Secondary | ICD-10-CM | POA: Diagnosis not present

## 2023-01-18 DIAGNOSIS — E10649 Type 1 diabetes mellitus with hypoglycemia without coma: Secondary | ICD-10-CM | POA: Diagnosis not present

## 2023-01-18 DIAGNOSIS — Z9641 Presence of insulin pump (external) (internal): Secondary | ICD-10-CM | POA: Diagnosis not present

## 2023-01-22 DIAGNOSIS — E1042 Type 1 diabetes mellitus with diabetic polyneuropathy: Secondary | ICD-10-CM | POA: Diagnosis not present

## 2023-01-22 DIAGNOSIS — B351 Tinea unguium: Secondary | ICD-10-CM | POA: Diagnosis not present

## 2023-02-03 DIAGNOSIS — D692 Other nonthrombocytopenic purpura: Secondary | ICD-10-CM | POA: Diagnosis not present

## 2023-02-03 DIAGNOSIS — Z515 Encounter for palliative care: Secondary | ICD-10-CM | POA: Diagnosis not present

## 2023-02-03 DIAGNOSIS — I10 Essential (primary) hypertension: Secondary | ICD-10-CM | POA: Diagnosis not present

## 2023-02-03 DIAGNOSIS — E109 Type 1 diabetes mellitus without complications: Secondary | ICD-10-CM | POA: Diagnosis not present

## 2023-02-25 DIAGNOSIS — E1043 Type 1 diabetes mellitus with diabetic autonomic (poly)neuropathy: Secondary | ICD-10-CM | POA: Diagnosis not present

## 2023-02-25 DIAGNOSIS — Z794 Long term (current) use of insulin: Secondary | ICD-10-CM | POA: Diagnosis not present

## 2023-02-25 DIAGNOSIS — E1065 Type 1 diabetes mellitus with hyperglycemia: Secondary | ICD-10-CM | POA: Diagnosis not present

## 2023-03-02 ENCOUNTER — Ambulatory Visit: Payer: HMO | Admitting: Hematology

## 2023-03-02 ENCOUNTER — Other Ambulatory Visit: Payer: HMO

## 2023-03-04 ENCOUNTER — Inpatient Hospital Stay: Payer: PPO | Attending: Hematology

## 2023-03-04 ENCOUNTER — Inpatient Hospital Stay (HOSPITAL_BASED_OUTPATIENT_CLINIC_OR_DEPARTMENT_OTHER): Payer: PPO | Admitting: Physician Assistant

## 2023-03-04 ENCOUNTER — Encounter: Payer: Self-pay | Admitting: Physician Assistant

## 2023-03-04 ENCOUNTER — Ambulatory Visit: Payer: HMO | Admitting: Physician Assistant

## 2023-03-04 ENCOUNTER — Ambulatory Visit: Payer: HMO | Admitting: Hematology

## 2023-03-04 VITALS — BP 144/59 | HR 82 | Temp 97.6°F | Resp 18 | Wt 121.0 lb

## 2023-03-04 DIAGNOSIS — R911 Solitary pulmonary nodule: Secondary | ICD-10-CM | POA: Diagnosis not present

## 2023-03-04 DIAGNOSIS — D693 Immune thrombocytopenic purpura: Secondary | ICD-10-CM | POA: Diagnosis not present

## 2023-03-04 DIAGNOSIS — D696 Thrombocytopenia, unspecified: Secondary | ICD-10-CM

## 2023-03-04 DIAGNOSIS — Z862 Personal history of diseases of the blood and blood-forming organs and certain disorders involving the immune mechanism: Secondary | ICD-10-CM | POA: Diagnosis not present

## 2023-03-04 LAB — CBC WITH DIFFERENTIAL/PLATELET
Abs Immature Granulocytes: 0.02 10*3/uL (ref 0.00–0.07)
Basophils Absolute: 0.1 10*3/uL (ref 0.0–0.1)
Basophils Relative: 1 %
Eosinophils Absolute: 0.2 10*3/uL (ref 0.0–0.5)
Eosinophils Relative: 3 %
HCT: 38.8 % (ref 36.0–46.0)
Hemoglobin: 12.4 g/dL (ref 12.0–15.0)
Immature Granulocytes: 0 %
Lymphocytes Relative: 24 %
Lymphs Abs: 1.6 10*3/uL (ref 0.7–4.0)
MCH: 29.7 pg (ref 26.0–34.0)
MCHC: 32 g/dL (ref 30.0–36.0)
MCV: 92.8 fL (ref 80.0–100.0)
Monocytes Absolute: 0.6 10*3/uL (ref 0.1–1.0)
Monocytes Relative: 8 %
Neutro Abs: 4.4 10*3/uL (ref 1.7–7.7)
Neutrophils Relative %: 64 %
Platelets: 176 10*3/uL (ref 150–400)
RBC: 4.18 MIL/uL (ref 3.87–5.11)
RDW: 13.2 % (ref 11.5–15.5)
WBC: 6.9 10*3/uL (ref 4.0–10.5)
nRBC: 0 % (ref 0.0–0.2)

## 2023-03-04 LAB — COMPREHENSIVE METABOLIC PANEL
ALT: 36 U/L (ref 0–44)
AST: 42 U/L — ABNORMAL HIGH (ref 15–41)
Albumin: 3.4 g/dL — ABNORMAL LOW (ref 3.5–5.0)
Alkaline Phosphatase: 111 U/L (ref 38–126)
Anion gap: 9 (ref 5–15)
BUN: 13 mg/dL (ref 8–23)
CO2: 27 mmol/L (ref 22–32)
Calcium: 8.5 mg/dL — ABNORMAL LOW (ref 8.9–10.3)
Chloride: 99 mmol/L (ref 98–111)
Creatinine, Ser: 0.75 mg/dL (ref 0.44–1.00)
GFR, Estimated: >60 mL/min (ref >60)
Glucose, Bld: 311 mg/dL — ABNORMAL HIGH (ref 70–99)
Potassium: 4.2 mmol/L (ref 3.5–5.1)
Sodium: 135 mmol/L (ref 135–145)
Total Bilirubin: 0.2 mg/dL — ABNORMAL LOW (ref 0.3–1.2)
Total Protein: 6.1 g/dL — ABNORMAL LOW (ref 6.5–8.1)

## 2023-03-04 LAB — LACTATE DEHYDROGENASE: LDH: 218 U/L — ABNORMAL HIGH (ref 98–192)

## 2023-03-04 NOTE — Progress Notes (Signed)
[facility] [address] [place], [place] [address]   CLINIC:  Medical Oncology/Hematology  PCP:  Marguarite Arbour, MD [address] [facility] [place] / [place] [phone]  REASON FOR VISIT:  Follow-up for chronic ITP with exacerbations  PRIOR THERAPY: none  CURRENT THERAPY: Promacta 25 mg daily.  INTERVAL HISTORY:  Madeline [name], a 89 y.o. female, seen for follow-up of immune mediated thrombocytopenia. She was last seen by Dr. Ellin Saba on 11/26/2022. She denies any changes to her health since then.   She is tolerating Promacta without any major issues.  She denies easy bruising or signs of bleeding. Her energy and appetite are stable. She denies nausea, vomiting or abdominal pain. Her diarrhea has resolved since the last visit. She denies fevers, chills, sweats, shortness of breath, chest pain or cough. She has no other complaints. Rest of the ROS is below.    Review of Systems  Constitutional:  Negative for fatigue.  Respiratory:  Negative for shortness of breath.   Gastrointestinal:  Negative for abdominal pain, constipation and diarrhea.  Hematological:  Does not bruise/bleed easily.  All other systems reviewed and are negative.   PAST MEDICAL/SURGICAL HISTORY:  Past Medical History:  Diagnosis Date   Bradycardia    Coronary artery disease    Diabetes mellitus    Dyslipidemia    GERD (gastroesophageal reflux disease)    Hypothyroid    Presence of permanent cardiac pacemaker 12/16/2018   Medtronic dual lead pacemaker   Past Surgical History:  Procedure Laterality Date   APPENDECTOMY     BREAST BIOPSY Right 1967   EXCISIONAL - NEG   CHOLECYSTECTOMY     CORONARY STENT INTERVENTION     INSERT / REPLACE / REMOVE PACEMAKER  12/16/2018   no surgical hx     PACEMAKER IMPLANT N/A 12/16/2018   Procedure: PACEMAKER IMPLANT;  Surgeon: Marinus Maw, MD;  Location: [facility] IN[facility];  Service: Cardiovascular;  Laterality:  N/A;    SOCIAL HISTORY:  Social History   Socioeconomic History   Marital status: Married    Spouse name: Not on file   Number of children: Not on file   Years of education: Not on file   Highest education level: Not on file  Occupational History   Not on file  Tobacco Use   Smoking status: Every Day    Packs/day: 1.00    Years: 50.00    Additional pack years: 0.00    Total pack years: 50.00    Types: Cigarettes   Smokeless tobacco: Never  Vaping Use   Vaping Use: Never used  Substance and Sexual Activity   Alcohol use: No   Drug use: No   Sexual activity: Not on file  Other Topics Concern   Not on file  Social History Narrative   Not on file   Social Determinants of Health   Financial Resource Strain: Not on file  Food Insecurity: No Food Insecurity (09/02/2020)   Hunger Vital Sign    Worried About Running Out of Food in the Last Year: Never true    Ran Out of Food in the Last Year: Never true  Transportation Needs: No Transportation Needs (09/02/2020)   PRAPARE - Administrator, Civil Service (Medical): No    Lack of Transportation (Non-Medical): No  Physical Activity: Not on file  Stress: Not on file  Social Connections: Not on file  Intimate Partner Violence: Not on  file    FAMILY HISTORY:  Family History  Problem Relation Age of Onset   Heart Problems Mother    Other Father        UNK   Cancer Sister        BREAST   Breast cancer Sister 23   Cancer Sister        BREAST   Breast cancer Sister 83   Cancer Sister        BREAST   Diabetes Son     CURRENT MEDICATIONS:  Current Outpatient Medications  Medication Sig Dispense Refill   acetaminophen (TYLENOL) 325 MG tablet Take 1-2 tablets (325-650 mg total) by mouth every 6 (six) hours as needed for mild pain or moderate pain.     atorvastatin (LIPITOR) 20 MG tablet Take 1 tablet (20 mg total) by mouth daily at 6 PM. 90 tablet 3   cetirizine (ZYRTEC) 10 MG tablet Take 10 mg by mouth  daily.     citalopram (CELEXA) 20 MG tablet Take 20 mg by mouth daily.     Continuous Blood Gluc Receiver (FREESTYLE LIBRE 2 READER) DEVI Use to monitor blood glucose.     CONTOUR NEXT TEST test strip USE 1 STRIP TO CHECK BLOOD SUGAR EIGHT TO NINE TIMES DAILY.     insulin aspart (NOVOLOG) 100 UNIT/ML injection CBG 70 - 120: 0 units  CBG 121 - 150: 1 unit  CBG 151 - 200: 2 units  CBG 201 - 250: 3 units  CBG 251 - 300: 5 units  CBG 301 - 350: 7 units  CBG 351 - 400: 9 units 10 mL 11   insulin glargine (LANTUS) 100 UNIT/ML injection Inject 0.15 mLs (15 Units total) into the skin at bedtime. Please do not use Lantus if the insulin pump is working. 10 mL 3   Insulin Human (INSULIN PUMP) SOLN Inject 1 each into the skin continuous. Used with Novolog insulin.  Please do not use Lantus if the pump is working.     Insulin Syringes, Disposable, U-100 0.3 ML MISC 100 each by Does not apply route daily. 100 each 2   Lancets 28G MISC Use as instructed. 7-8 times daily.     levothyroxine (SYNTHROID) 75 MCG tablet Take by mouth.     Multiple Vitamin (MULTIVITAMIN WITH MINERALS) TABS tablet Take 1 tablet by mouth daily.     nicotine (NICODERM CQ - DOSED IN MG/24 HOURS) 21 mg/24hr patch Place 1 patch (21 mg total) onto the skin daily. 28 patch 0   NOVOLOG 100 UNIT/ML injection Inject 0-50 Units into the skin continuous. Up to 50 units/ day via pump 10 mL 0   lisinopril (PRINIVIL,ZESTRIL) 20 MG tablet Take 1 tablet (20 mg total) by mouth daily. 30 tablet 1   No current facility-administered medications for this visit.    ALLERGIES:  Allergies  Allergen Reactions   Darvon     "FEEL SICK"   Propoxyphene Nausea Only and Nausea And Vomiting   Sulfa Antibiotics Nausea Only and Nausea And Vomiting    "FEEL SICK"     PHYSICAL EXAM:  Performance status (ECOG): 0 - Asymptomatic  Vitals:   03/04/23 0937  BP: (!) 144/59  Pulse: 82  Resp: 18  Temp: 97.6 F (36.4 C)  SpO2: 96%   Wt Readings from  Last 3 Encounters:  03/04/23 121 lb (54.9 kg)  11/26/22 116 lb 6.4 oz (52.8 kg)  10/04/22 102 lb (46.3 kg)   Physical Exam Vitals reviewed.  Constitutional:  Appearance: Normal appearance.  Cardiovascular:     Rate and Rhythm: Normal rate and regular rhythm.     Pulses: Normal pulses.     Heart sounds: Normal heart sounds.  Pulmonary:     Effort: Pulmonary effort is normal.     Breath sounds: Normal breath sounds.  Neurological:     General: No focal deficit present.     Mental Status: She is alert and oriented to person, place, and time.  Psychiatric:        Mood and Affect: Mood normal.        Behavior: Behavior normal.     LABORATORY DATA:  I have reviewed the labs as listed.     Latest Ref Rng & Units 11/26/2022    2:33 PM 10/04/2022    6:02 AM 08/26/2022    1:01 PM  CBC  WBC 4.0 - 10.5 K/uL 6.4  10.2  5.4   Hemoglobin 12.0 - 15.0 g/dL 40.9  81.1  91.4   Hematocrit 36.0 - 46.0 % 35.1  37.5  38.4   Platelets 150 - 400 K/uL 145  231  208       Latest Ref Rng & Units 11/26/2022    2:33 PM 10/05/2022   12:04 PM 10/05/2022    8:02 AM  CMP  Glucose 70 - 99 mg/dL 782  956  76   BUN 8 - 23 mg/dL 11  14  15    Creatinine 0.44 - 1.00 mg/dL 2.13  0.86  5.78   Sodium 135 - 145 mmol/L 135  143  142   Potassium 3.5 - 5.1 mmol/L 3.9  3.6  3.9   Chloride 98 - 111 mmol/L 103  114  114   CO2 22 - 32 mmol/L 25  23  22    Calcium 8.9 - 10.3 mg/dL 8.1  8.3  8.1   Total Protein 6.5 - 8.1 g/dL 5.9     Total Bilirubin 0.3 - 1.2 mg/dL 0.4     Alkaline Phos 38 - 126 U/L 79     AST 15 - 41 U/L 30     ALT 0 - 44 U/L 25         Component Value Date/Time   RBC 3.79 (L) 11/26/2022 1433   MCV 92.6 11/26/2022 1433   MCV 92 12/09/2018 1308   MCH 30.3 11/26/2022 1433   MCHC 32.8 11/26/2022 1433   RDW 13.5 11/26/2022 1433   RDW 12.2 12/09/2018 1308   LYMPHSABS 2.1 11/26/2022 1433   LYMPHSABS 2.0 12/09/2018 1308   MONOABS 0.5 11/26/2022 1433   EOSABS 0.1 11/26/2022 1433   EOSABS  0.2 12/09/2018 1308   BASOSABS 0.1 11/26/2022 1433   BASOSABS 0.0 12/09/2018 1308    DIAGNOSTIC IMAGING:  I have independently reviewed the scans and discussed with the patient. No results found.   ASSESSMENT:  1.  Chronic ITP with exacerbations: Patient had thrombocytopenia since July 2016.  CT abdomen on 02/02/2018 showed normal-sized spleen.  Bone marrow aspiration and biopsy in 09/15/2017 showed hypercellular marrow for age, trilineage hematopoiesis with abundant megakaryocytes with no dysplasia. - Prednisone tapering dose over 5 weeks given on 12/14/2017 with no improvement in platelet count - IVIG (1 g/kg) on 12/23/2017 and 12/24/2017 with peak platelet count of 92, response lasting about 3 weeks -Dexamethasone 40 mg for 4 days on 01/11/2018 with improvement in platelet count of 61. - IVIG 1 g/kg on 12/18/2021 and 12/19/2021 without response. - Nplate 2 mcg/kg on 12/22/2021. - Promacta  25 mg daily started on 12/26/2021.     2.  Right middle lobe lung nodule: -CT of the chest on 08/08/2018 showed right middle lobe lung nodule with one-year follow-up recommended.  - CT chest on 04/16/2022 shows benign lung nodules with no abnormalities.  No further scans needed.  PLAN:  1.  Chronic ITP with exacerbations: - She is tolerating Promacta very well. - Labs from today were reviewed. Platelet count is normal at 176K. No other cytopenias. CMP shows normal creatinine and mild elevation of AST measuring 42. ALT normal. LDH 218. - Continue Promacta 25 mg daily. - RTC 3 months for follow-up with repeat labs.   2.  Health maintenance: - Mammogram on 12/30/2022 was BI-RADS Category 1-negative --Next mammogram due in one year, February 2025.   Orders placed this encounter:  No orders of the defined types were placed in this encounter.   I have spent a total of 25 minutes minutes of face-to-face and non-face-to-face time, preparing to see the patient,  performing a medically appropriate examination,  counseling and educating the patient, documenting clinical information in the electronic health record,and care coordination.   Madeline Kaufmann PA-C Dept of Hematology and Oncology [facility]

## 2023-03-09 DIAGNOSIS — D692 Other nonthrombocytopenic purpura: Secondary | ICD-10-CM | POA: Diagnosis not present

## 2023-03-09 DIAGNOSIS — Z6822 Body mass index (BMI) 22.0-22.9, adult: Secondary | ICD-10-CM | POA: Diagnosis not present

## 2023-03-09 DIAGNOSIS — E109 Type 1 diabetes mellitus without complications: Secondary | ICD-10-CM | POA: Diagnosis not present

## 2023-03-09 DIAGNOSIS — F17200 Nicotine dependence, unspecified, uncomplicated: Secondary | ICD-10-CM | POA: Diagnosis not present

## 2023-03-09 DIAGNOSIS — Z515 Encounter for palliative care: Secondary | ICD-10-CM | POA: Diagnosis not present

## 2023-03-09 DIAGNOSIS — I10 Essential (primary) hypertension: Secondary | ICD-10-CM | POA: Diagnosis not present

## 2023-03-09 DIAGNOSIS — Z9641 Presence of insulin pump (external) (internal): Secondary | ICD-10-CM | POA: Diagnosis not present

## 2023-03-11 DIAGNOSIS — E10649 Type 1 diabetes mellitus with hypoglycemia without coma: Secondary | ICD-10-CM | POA: Diagnosis not present

## 2023-03-11 DIAGNOSIS — Z9641 Presence of insulin pump (external) (internal): Secondary | ICD-10-CM | POA: Diagnosis not present

## 2023-03-11 DIAGNOSIS — I10 Essential (primary) hypertension: Secondary | ICD-10-CM | POA: Diagnosis not present

## 2023-03-11 DIAGNOSIS — E1065 Type 1 diabetes mellitus with hyperglycemia: Secondary | ICD-10-CM | POA: Diagnosis not present

## 2023-03-11 DIAGNOSIS — E1043 Type 1 diabetes mellitus with diabetic autonomic (poly)neuropathy: Secondary | ICD-10-CM | POA: Diagnosis not present

## 2023-03-11 DIAGNOSIS — F17210 Nicotine dependence, cigarettes, uncomplicated: Secondary | ICD-10-CM | POA: Diagnosis not present

## 2023-03-12 ENCOUNTER — Ambulatory Visit (INDEPENDENT_AMBULATORY_CARE_PROVIDER_SITE_OTHER): Payer: PPO

## 2023-03-12 DIAGNOSIS — I442 Atrioventricular block, complete: Secondary | ICD-10-CM

## 2023-03-12 LAB — CUP PACEART REMOTE DEVICE CHECK
Battery Remaining Longevity: 15 mo
Battery Voltage: 2.87 V
Brady Statistic AP VP Percent: 0.11 %
Brady Statistic AP VS Percent: 0 %
Brady Statistic AS VP Percent: 99.79 %
Brady Statistic AS VS Percent: 0.1 %
Brady Statistic RA Percent Paced: 0.11 %
Brady Statistic RV Percent Paced: 99.9 %
Date Time Interrogation Session: 20240425221713
Implantable Lead Connection Status: 753985
Implantable Lead Connection Status: 753985
Implantable Lead Implant Date: 20200131
Implantable Lead Implant Date: 20200131
Implantable Lead Location: 753859
Implantable Lead Location: 753860
Implantable Lead Model: 3830
Implantable Lead Model: 5076
Implantable Pulse Generator Implant Date: 20200131
Lead Channel Impedance Value: 285 Ohm
Lead Channel Impedance Value: 361 Ohm
Lead Channel Impedance Value: 418 Ohm
Lead Channel Impedance Value: 456 Ohm
Lead Channel Pacing Threshold Amplitude: 0.5 V
Lead Channel Pacing Threshold Amplitude: 1.5 V
Lead Channel Pacing Threshold Pulse Width: 0.4 ms
Lead Channel Pacing Threshold Pulse Width: 0.4 ms
Lead Channel Sensing Intrinsic Amplitude: 2.5 mV
Lead Channel Sensing Intrinsic Amplitude: 2.5 mV
Lead Channel Sensing Intrinsic Amplitude: 5 mV
Lead Channel Sensing Intrinsic Amplitude: 5 mV
Lead Channel Setting Pacing Amplitude: 1.5 V
Lead Channel Setting Pacing Amplitude: 2.5 V
Lead Channel Setting Pacing Pulse Width: 0.5 ms
Lead Channel Setting Sensing Sensitivity: 4 mV
Zone Setting Status: 755011
Zone Setting Status: 755011

## 2023-03-15 ENCOUNTER — Encounter (HOSPITAL_COMMUNITY): Payer: Self-pay | Admitting: Internal Medicine

## 2023-03-20 ENCOUNTER — Emergency Department: Payer: PPO

## 2023-03-20 ENCOUNTER — Other Ambulatory Visit: Payer: Self-pay

## 2023-03-20 ENCOUNTER — Emergency Department
Admission: EM | Admit: 2023-03-20 | Discharge: 2023-03-20 | Disposition: A | Discharge status: Home / Self Care | Payer: PPO | Attending: Emergency Medicine | Admitting: Emergency Medicine

## 2023-03-20 DIAGNOSIS — Z20822 Contact with and (suspected) exposure to covid-19: Secondary | ICD-10-CM | POA: Insufficient documentation

## 2023-03-20 DIAGNOSIS — R059 Cough, unspecified: Secondary | ICD-10-CM | POA: Diagnosis not present

## 2023-03-20 DIAGNOSIS — R509 Fever, unspecified: Secondary | ICD-10-CM | POA: Diagnosis not present

## 2023-03-20 DIAGNOSIS — B349 Viral infection, unspecified: Secondary | ICD-10-CM | POA: Insufficient documentation

## 2023-03-20 LAB — SARS CORONAVIRUS 2 BY RT PCR: SARS Coronavirus 2 by RT PCR: NEGATIVE

## 2023-03-20 MED ORDER — BENZONATATE 100 MG PO CAPS: 100.0000 mg | ORAL_CAPSULE | Freq: Three times a day (TID) | ORAL | 0 refills | Status: DC | PRN

## 2023-03-20 MED ORDER — PREDNISONE 20 MG PO TABS
60.0000 mg | ORAL_TABLET | Freq: Once | ORAL | Status: AC
  Administered 2023-03-20: 60 mg via ORAL
  Filled 2023-03-20: qty 3

## 2023-03-20 MED ORDER — PREDNISONE 50 MG PO TABS: 50.0000 mg | ORAL_TABLET | Freq: Every day | ORAL | 0 refills | Status: DC

## 2023-03-20 NOTE — ED Provider Notes (Signed)
[facility] Provider Note  Patient Contact: 9:03 PM (approximate)   History   Cough and Fever   HPI  Madeline [name] is a 89 y.o. female who presents the emergency department complaining of congestion, cough and fever.  Patient states that she has had symptoms for the last 3 days.  She had symptoms of congestion cough first.  Yesterday she had a fever to 100.7 F.  She does not take any medications today and arrives afebrile.  Patient is not currently short of breath, is not coughing currently either.  Denies sore throat, GI symptoms, urinary symptoms.     Physical Exam   Triage Vital Signs: ED Triage Vitals  Enc Vitals Group     BP 03/20/23 1826 (!) 154/68     Pulse Rate 03/20/23 1826 91     Resp 03/20/23 1826 20     Temp 03/20/23 1826 98.6 F (37 C)     Temp Source 03/20/23 1826 Oral     SpO2 03/20/23 1826 96 %     Weight 03/20/23 1827 124 lb (56.2 kg)     Height 03/20/23 1827 5\' 2"  (1.575 m)     Head Circumference --      Peak Flow --      Pain Score 03/20/23 1827 3     Pain Loc --      Pain Edu? --      Excl. in GC? --     Most recent vital signs: Vitals:   03/20/23 1826  BP: (!) 154/68  Pulse: 91  Resp: 20  Temp: 98.6 F (37 C)  SpO2: 96%     General: Alert and in no acute distress. ENT:      Ears:       Nose: No congestion/rhinnorhea.      Mouth/Throat: Mucous membranes are moist. Neck: No stridor. No cervical spine tenderness to palpation.  Cardiovascular:  Good peripheral perfusion Respiratory: Normal respiratory effort without tachypnea or retractions. Lungs CTAB. Good air entry to the bases with no decreased or absent breath sounds. Musculoskeletal: Full range of motion to all extremities.  Neurologic:  No gross focal neurologic deficits are appreciated.  Skin:   No rash noted Other:   ED Results / Procedures / Treatments   Labs (all labs ordered are listed, but only abnormal results are displayed) Labs Reviewed   SARS CORONAVIRUS 2 BY RT PCR     EKG     RADIOLOGY  I personally viewed, evaluated, and interpreted these images as part of my medical decision making, as well as reviewing the written report by the radiologist.  ED Provider Interpretation: No acute cardiopulmonary findings on chest x-ray  DG Chest 2 View  Result Date: 03/20/2023 CLINICAL DATA:  Cough and fever EXAM: CHEST - 2 VIEW COMPARISON:  12/15/2021 FINDINGS: Stable cardiomediastinal silhouette. Aortic atherosclerotic calcification. Left chest wall pacemaker. Chronic bronchitic changes. No focal consolidation, pleural effusion, or pneumothorax. Biapical pleural-parenchymal scarring. Bibasilar atelectasis or scarring. IMPRESSION: No active cardiopulmonary disease. Electronically Signed   By: Minerva Fester M.D.   On: 03/20/2023 19:34    PROCEDURES:  Critical Care performed: No  Procedures   MEDICATIONS ORDERED IN ED: Medications  predniSONE (DELTASONE) tablet 60 mg (60 mg Oral Given 03/20/23 2130)     IMPRESSION / MDM / ASSESSMENT AND PLAN / ED COURSE  I reviewed the triage vital signs and the nursing notes.  Differential diagnosis includes, but is not limited to, viral illness, COVID, flu, pneumonia, bronchitis  Patient's presentation is most consistent with acute presentation with potential threat to life or bodily function.   Patient's diagnosis is consistent with viral illness.  Patient presents emergency department with congestion, cough, fever yesterday.  Patient arrives afebrile.  Vital signs are reassuring.  Patient has some congestion on exam, lungs are clear.  Swab is negative.  Chest x-ray reassuring with no consolidation concerning for pneumonia.Malachi Bonds course of steroid for symptomatic improvement.  Follow-up primary care as needed.  Patient is given ED precautions to return to the ED for any worsening or new symptoms.     FINAL CLINICAL IMPRESSION(S) / ED DIAGNOSES    Final diagnoses:  Viral illness     Rx / DC Orders   ED Discharge Orders          Ordered    predniSONE (DELTASONE) 50 MG tablet  Daily with breakfast        03/20/23 2131    benzonatate (TESSALON PERLES) 100 MG capsule  3 times daily PRN        03/20/23 2131             Note:  This document was prepared using Dragon voice recognition software and may include unintentional dictation errors.   Lanette Hampshire 03/20/23 2131    Concha Se, MD 03/21/23 1556

## 2023-03-20 NOTE — ED Triage Notes (Signed)
Pt to Ed POV for cough since 2 days and "fever" since today. Temp at home was 100.7. Also complains of nasal drainage. No OTC meds taken today and temp in triage is 98.6.   Respirations unlabored. Skin dry. Current smoker 1 pack/day X 50 years.

## 2023-03-24 DIAGNOSIS — E1065 Type 1 diabetes mellitus with hyperglycemia: Secondary | ICD-10-CM | POA: Diagnosis not present

## 2023-03-26 DIAGNOSIS — D692 Other nonthrombocytopenic purpura: Secondary | ICD-10-CM | POA: Diagnosis not present

## 2023-03-26 DIAGNOSIS — E109 Type 1 diabetes mellitus without complications: Secondary | ICD-10-CM | POA: Diagnosis not present

## 2023-03-26 DIAGNOSIS — Z515 Encounter for palliative care: Secondary | ICD-10-CM | POA: Diagnosis not present

## 2023-03-26 DIAGNOSIS — I10 Essential (primary) hypertension: Secondary | ICD-10-CM | POA: Diagnosis not present

## 2023-03-28 DIAGNOSIS — Z794 Long term (current) use of insulin: Secondary | ICD-10-CM | POA: Diagnosis not present

## 2023-03-28 DIAGNOSIS — E1043 Type 1 diabetes mellitus with diabetic autonomic (poly)neuropathy: Secondary | ICD-10-CM | POA: Diagnosis not present

## 2023-03-28 DIAGNOSIS — E1065 Type 1 diabetes mellitus with hyperglycemia: Secondary | ICD-10-CM | POA: Diagnosis not present

## 2023-04-06 NOTE — Progress Notes (Signed)
Remote pacemaker transmission.   

## 2023-04-11 ENCOUNTER — Inpatient Hospital Stay (HOSPITAL_COMMUNITY)
Admission: EM | Admit: 2023-04-11 | Discharge: 2023-04-19 | DRG: 637 | Disposition: A | Discharge status: Skilled Nursing Facility | Payer: PPO | Attending: Internal Medicine | Admitting: Internal Medicine

## 2023-04-11 ENCOUNTER — Emergency Department (HOSPITAL_COMMUNITY): Payer: PPO

## 2023-04-11 ENCOUNTER — Encounter (HOSPITAL_COMMUNITY): Payer: Self-pay

## 2023-04-11 DIAGNOSIS — I110 Hypertensive heart disease with heart failure: Secondary | ICD-10-CM | POA: Diagnosis not present

## 2023-04-11 DIAGNOSIS — Z4659 Encounter for fitting and adjustment of other gastrointestinal appliance and device: Secondary | ICD-10-CM | POA: Diagnosis not present

## 2023-04-11 DIAGNOSIS — E876 Hypokalemia: Secondary | ICD-10-CM | POA: Diagnosis not present

## 2023-04-11 DIAGNOSIS — R4189 Other symptoms and signs involving cognitive functions and awareness: Secondary | ICD-10-CM

## 2023-04-11 DIAGNOSIS — I2510 Atherosclerotic heart disease of native coronary artery without angina pectoris: Secondary | ICD-10-CM | POA: Diagnosis present

## 2023-04-11 DIAGNOSIS — E162 Hypoglycemia, unspecified: Principal | ICD-10-CM | POA: Diagnosis present

## 2023-04-11 DIAGNOSIS — J9601 Acute respiratory failure with hypoxia: Secondary | ICD-10-CM | POA: Diagnosis not present

## 2023-04-11 DIAGNOSIS — D696 Thrombocytopenia, unspecified: Secondary | ICD-10-CM | POA: Diagnosis present

## 2023-04-11 DIAGNOSIS — R41841 Cognitive communication deficit: Secondary | ICD-10-CM | POA: Diagnosis not present

## 2023-04-11 DIAGNOSIS — R000 Tachycardia, unspecified: Secondary | ICD-10-CM | POA: Diagnosis not present

## 2023-04-11 DIAGNOSIS — R0689 Other abnormalities of breathing: Secondary | ICD-10-CM | POA: Diagnosis not present

## 2023-04-11 DIAGNOSIS — I10 Essential (primary) hypertension: Secondary | ICD-10-CM | POA: Diagnosis not present

## 2023-04-11 DIAGNOSIS — Z9049 Acquired absence of other specified parts of digestive tract: Secondary | ICD-10-CM

## 2023-04-11 DIAGNOSIS — Z803 Family history of malignant neoplasm of breast: Secondary | ICD-10-CM

## 2023-04-11 DIAGNOSIS — Z794 Long term (current) use of insulin: Secondary | ICD-10-CM | POA: Diagnosis not present

## 2023-04-11 DIAGNOSIS — E039 Hypothyroidism, unspecified: Secondary | ICD-10-CM | POA: Diagnosis not present

## 2023-04-11 DIAGNOSIS — E11649 Type 2 diabetes mellitus with hypoglycemia without coma: Principal | ICD-10-CM | POA: Diagnosis present

## 2023-04-11 DIAGNOSIS — J690 Pneumonitis due to inhalation of food and vomit: Secondary | ICD-10-CM | POA: Diagnosis not present

## 2023-04-11 DIAGNOSIS — H9190 Unspecified hearing loss, unspecified ear: Secondary | ICD-10-CM | POA: Diagnosis present

## 2023-04-11 DIAGNOSIS — K219 Gastro-esophageal reflux disease without esophagitis: Secondary | ICD-10-CM | POA: Diagnosis not present

## 2023-04-11 DIAGNOSIS — E872 Acidosis, unspecified: Secondary | ICD-10-CM | POA: Diagnosis not present

## 2023-04-11 DIAGNOSIS — E871 Hypo-osmolality and hyponatremia: Secondary | ICD-10-CM | POA: Diagnosis not present

## 2023-04-11 DIAGNOSIS — F17210 Nicotine dependence, cigarettes, uncomplicated: Secondary | ICD-10-CM | POA: Diagnosis not present

## 2023-04-11 DIAGNOSIS — E11641 Type 2 diabetes mellitus with hypoglycemia with coma: Secondary | ICD-10-CM | POA: Diagnosis not present

## 2023-04-11 DIAGNOSIS — R404 Transient alteration of awareness: Secondary | ICD-10-CM | POA: Diagnosis not present

## 2023-04-11 DIAGNOSIS — Z9641 Presence of insulin pump (external) (internal): Secondary | ICD-10-CM | POA: Diagnosis present

## 2023-04-11 DIAGNOSIS — M6281 Muscle weakness (generalized): Secondary | ICD-10-CM | POA: Diagnosis not present

## 2023-04-11 DIAGNOSIS — Z885 Allergy status to narcotic agent status: Secondary | ICD-10-CM

## 2023-04-11 DIAGNOSIS — Z9911 Dependence on respirator [ventilator] status: Secondary | ICD-10-CM | POA: Diagnosis not present

## 2023-04-11 DIAGNOSIS — R509 Fever, unspecified: Secondary | ICD-10-CM | POA: Diagnosis not present

## 2023-04-11 DIAGNOSIS — R2689 Other abnormalities of gait and mobility: Secondary | ICD-10-CM | POA: Diagnosis not present

## 2023-04-11 DIAGNOSIS — Z888 Allergy status to other drugs, medicaments and biological substances status: Secondary | ICD-10-CM

## 2023-04-11 DIAGNOSIS — I442 Atrioventricular block, complete: Secondary | ICD-10-CM | POA: Diagnosis present

## 2023-04-11 DIAGNOSIS — N179 Acute kidney failure, unspecified: Secondary | ICD-10-CM | POA: Diagnosis not present

## 2023-04-11 DIAGNOSIS — I5032 Chronic diastolic (congestive) heart failure: Secondary | ICD-10-CM | POA: Diagnosis present

## 2023-04-11 DIAGNOSIS — R4182 Altered mental status, unspecified: Secondary | ICD-10-CM | POA: Diagnosis not present

## 2023-04-11 DIAGNOSIS — E785 Hyperlipidemia, unspecified: Secondary | ICD-10-CM | POA: Diagnosis present

## 2023-04-11 DIAGNOSIS — E1165 Type 2 diabetes mellitus with hyperglycemia: Secondary | ICD-10-CM | POA: Diagnosis not present

## 2023-04-11 DIAGNOSIS — Z79899 Other long term (current) drug therapy: Secondary | ICD-10-CM

## 2023-04-11 DIAGNOSIS — R0609 Other forms of dyspnea: Secondary | ICD-10-CM | POA: Diagnosis not present

## 2023-04-11 DIAGNOSIS — G9341 Metabolic encephalopathy: Secondary | ICD-10-CM | POA: Diagnosis not present

## 2023-04-11 DIAGNOSIS — Z955 Presence of coronary angioplasty implant and graft: Secondary | ICD-10-CM

## 2023-04-11 DIAGNOSIS — E119 Type 2 diabetes mellitus without complications: Secondary | ICD-10-CM

## 2023-04-11 DIAGNOSIS — Z833 Family history of diabetes mellitus: Secondary | ICD-10-CM

## 2023-04-11 DIAGNOSIS — Z950 Presence of cardiac pacemaker: Secondary | ICD-10-CM | POA: Diagnosis not present

## 2023-04-11 DIAGNOSIS — Z882 Allergy status to sulfonamides status: Secondary | ICD-10-CM

## 2023-04-11 DIAGNOSIS — R1310 Dysphagia, unspecified: Secondary | ICD-10-CM | POA: Diagnosis not present

## 2023-04-11 DIAGNOSIS — E161 Other hypoglycemia: Secondary | ICD-10-CM | POA: Diagnosis not present

## 2023-04-11 DIAGNOSIS — J189 Pneumonia, unspecified organism: Secondary | ICD-10-CM | POA: Diagnosis not present

## 2023-04-11 DIAGNOSIS — E1010 Type 1 diabetes mellitus with ketoacidosis without coma: Secondary | ICD-10-CM

## 2023-04-11 LAB — URINALYSIS, W/ REFLEX TO CULTURE (INFECTION SUSPECTED)
Bacteria, UA: NONE SEEN
Bilirubin Urine: NEGATIVE
Glucose, UA: >=500 mg/dL — AB
Hgb urine dipstick: NEGATIVE
Ketones, ur: NEGATIVE mg/dL
Leukocytes,Ua: NEGATIVE
Nitrite: NEGATIVE
Protein, ur: NEGATIVE mg/dL
Specific Gravity, Urine: 1.014 (ref 1.005–1.030)
pH: 7 (ref 5.0–8.0)

## 2023-04-11 LAB — CBC WITH DIFFERENTIAL/PLATELET
Abs Immature Granulocytes: 0.08 10*3/uL — ABNORMAL HIGH (ref 0.00–0.07)
Basophils Absolute: 0 10*3/uL (ref 0.0–0.1)
Basophils Relative: 0 %
Eosinophils Absolute: 0 10*3/uL (ref 0.0–0.5)
Eosinophils Relative: 0 %
HCT: 36.6 % (ref 36.0–46.0)
Hemoglobin: 11.8 g/dL — ABNORMAL LOW (ref 12.0–15.0)
Immature Granulocytes: 1 %
Lymphocytes Relative: 8 %
Lymphs Abs: 1 10*3/uL (ref 0.7–4.0)
MCH: 29.3 pg (ref 26.0–34.0)
MCHC: 32.2 g/dL (ref 30.0–36.0)
MCV: 90.8 fL (ref 80.0–100.0)
Monocytes Absolute: 1.4 10*3/uL — ABNORMAL HIGH (ref 0.1–1.0)
Monocytes Relative: 11 %
Neutro Abs: 10.3 10*3/uL — ABNORMAL HIGH (ref 1.7–7.7)
Neutrophils Relative %: 80 %
Platelets: 295 10*3/uL (ref 150–400)
RBC: 4.03 MIL/uL (ref 3.87–5.11)
RDW: 13.6 % (ref 11.5–15.5)
WBC: 12.8 10*3/uL — ABNORMAL HIGH (ref 4.0–10.5)
nRBC: 0 % (ref 0.0–0.2)

## 2023-04-11 LAB — TROPONIN I (HIGH SENSITIVITY)
Troponin I (High Sensitivity): 75 ng/L — ABNORMAL HIGH (ref <18)
Troponin I (High Sensitivity): 65 ng/L — ABNORMAL HIGH (ref <18)

## 2023-04-11 LAB — COMPREHENSIVE METABOLIC PANEL
ALT: 30 U/L (ref 0–44)
AST: 31 U/L (ref 15–41)
Albumin: 3.5 g/dL (ref 3.5–5.0)
Alkaline Phosphatase: 88 U/L (ref 38–126)
Anion gap: 11 (ref 5–15)
BUN: 17 mg/dL (ref 8–23)
CO2: 26 mmol/L (ref 22–32)
Calcium: 8.7 mg/dL — ABNORMAL LOW (ref 8.9–10.3)
Chloride: 101 mmol/L (ref 98–111)
Creatinine, Ser: 0.86 mg/dL (ref 0.44–1.00)
GFR, Estimated: >60 mL/min (ref >60)
Glucose, Bld: 137 mg/dL — ABNORMAL HIGH (ref 70–99)
Potassium: 3.4 mmol/L — ABNORMAL LOW (ref 3.5–5.1)
Sodium: 138 mmol/L (ref 135–145)
Total Bilirubin: 0.7 mg/dL (ref 0.3–1.2)
Total Protein: 6.4 g/dL — ABNORMAL LOW (ref 6.5–8.1)

## 2023-04-11 LAB — GLUCOSE, CAPILLARY
Glucose-Capillary: 263 mg/dL — ABNORMAL HIGH (ref 70–99)
Glucose-Capillary: 388 mg/dL — ABNORMAL HIGH (ref 70–99)
Glucose-Capillary: 460 mg/dL — ABNORMAL HIGH (ref 70–99)
Glucose-Capillary: 470 mg/dL — ABNORMAL HIGH (ref 70–99)
Glucose-Capillary: 484 mg/dL — ABNORMAL HIGH (ref 70–99)
Glucose-Capillary: 494 mg/dL — ABNORMAL HIGH (ref 70–99)
Glucose-Capillary: 535 mg/dL (ref 70–99)
Glucose-Capillary: 541 mg/dL (ref 70–99)

## 2023-04-11 LAB — I-STAT ARTERIAL BLOOD GAS, ED
Acid-Base Excess: 3 mmol/L — ABNORMAL HIGH (ref 0.0–2.0)
Bicarbonate: 29 mmol/L — ABNORMAL HIGH (ref 20.0–28.0)
Calcium, Ion: 1.19 mmol/L (ref 1.15–1.40)
HCT: 34 % — ABNORMAL LOW (ref 36.0–46.0)
Hemoglobin: 11.6 g/dL — ABNORMAL LOW (ref 12.0–15.0)
O2 Saturation: 100 %
Patient temperature: 98.6
Potassium: 3.3 mmol/L — ABNORMAL LOW (ref 3.5–5.1)
Sodium: 138 mmol/L (ref 135–145)
TCO2: 31 mmol/L (ref 22–32)
pCO2 arterial: 52.6 mmHg — ABNORMAL HIGH (ref 32–48)
pH, Arterial: 7.35 (ref 7.35–7.45)
pO2, Arterial: 564 mmHg — ABNORMAL HIGH (ref 83–108)

## 2023-04-11 LAB — BRAIN NATRIURETIC PEPTIDE: B Natriuretic Peptide: 189.7 pg/mL — ABNORMAL HIGH (ref 0.0–100.0)

## 2023-04-11 LAB — MAGNESIUM
Magnesium: 2.8 mg/dL — ABNORMAL HIGH (ref 1.7–2.4)
Magnesium: 2.3 mg/dL (ref 1.7–2.4)

## 2023-04-11 LAB — AMMONIA: Ammonia: 14 umol/L (ref 9–35)

## 2023-04-11 LAB — CBG MONITORING, ED
Glucose-Capillary: 78 mg/dL (ref 70–99)
Glucose-Capillary: 184 mg/dL — ABNORMAL HIGH (ref 70–99)

## 2023-04-11 LAB — LACTIC ACID, PLASMA
Lactic Acid, Venous: 2.8 mmol/L (ref 0.5–1.9)
Lactic Acid, Venous: 1.6 mmol/L (ref 0.5–1.9)

## 2023-04-11 LAB — POTASSIUM: Potassium: 5.3 mmol/L — ABNORMAL HIGH (ref 3.5–5.1)

## 2023-04-11 LAB — PHOSPHORUS: Phosphorus: 4.6 mg/dL (ref 2.5–4.6)

## 2023-04-11 LAB — PROCALCITONIN: Procalcitonin: <0.1 ng/mL

## 2023-04-11 LAB — PROTIME-INR
INR: 1 (ref 0.8–1.2)
Prothrombin Time: 13.5 seconds (ref 11.4–15.2)

## 2023-04-11 LAB — MRSA NEXT GEN BY PCR, NASAL: MRSA by PCR Next Gen: NOT DETECTED

## 2023-04-11 LAB — TSH: TSH: 0.744 u[IU]/mL (ref 0.350–4.500)

## 2023-04-11 MED ORDER — DOCUSATE SODIUM 50 MG/5ML PO LIQD: 100.0000 mg | Freq: Two times a day (BID) | ORAL | Status: DC | PRN

## 2023-04-11 MED ORDER — ORAL CARE MOUTH RINSE: 15.0000 mL | OROMUCOSAL | Status: DC | PRN

## 2023-04-11 MED ORDER — LACTATED RINGERS IV BOLUS
1000.0000 mL | Freq: Once | INTRAVENOUS | Status: AC
  Administered 2023-04-11: 1000 mL via INTRAVENOUS

## 2023-04-11 MED ORDER — PANTOPRAZOLE SODIUM 40 MG IV SOLR
40.0000 mg | INTRAVENOUS | Status: DC
  Administered 2023-04-11: 40 mg via INTRAVENOUS
  Filled 2023-04-11: qty 10

## 2023-04-11 MED ORDER — FENTANYL BOLUS VIA INFUSION
25.0000 ug | INTRAVENOUS | Status: DC | PRN
  Administered 2023-04-11 (×2): 100 ug via INTRAVENOUS

## 2023-04-11 MED ORDER — INSULIN REGULAR(HUMAN) IN NACL 100-0.9 UT/100ML-% IV SOLN
INTRAVENOUS | Status: DC
  Administered 2023-04-11: 7 [IU]/h via INTRAVENOUS
  Administered 2023-04-12: 2.6 [IU]/h via INTRAVENOUS
  Filled 2023-04-11 (×2): qty 100

## 2023-04-11 MED ORDER — ETOMIDATE 2 MG/ML IV SOLN
20.0000 mg | Freq: Once | INTRAVENOUS | Status: AC
  Administered 2023-04-11: 20 mg via INTRAVENOUS

## 2023-04-11 MED ORDER — CLEVIDIPINE BUTYRATE 0.5 MG/ML IV EMUL
0.0000 mg/h | INTRAVENOUS | Status: DC
  Administered 2023-04-11: 2 mg/h via INTRAVENOUS
  Filled 2023-04-11: qty 100

## 2023-04-11 MED ORDER — DEXTROSE 10 % IV SOLN: INTRAVENOUS | Status: DC

## 2023-04-11 MED ORDER — INSULIN ASPART 100 UNIT/ML IJ SOLN: 0.0000 [IU] | INTRAMUSCULAR | Status: DC

## 2023-04-11 MED ORDER — ACETAMINOPHEN 325 MG PO TABS: 650.0000 mg | ORAL_TABLET | Freq: Four times a day (QID) | ORAL | Status: DC | PRN

## 2023-04-11 MED ORDER — SODIUM CHLORIDE 0.9 % IV SOLN: 2.0000 g | Freq: Once | INTRAVENOUS | Status: DC

## 2023-04-11 MED ORDER — ATORVASTATIN CALCIUM 10 MG PO TABS
20.0000 mg | ORAL_TABLET | Freq: Every day | ORAL | Status: DC
  Administered 2023-04-11 – 2023-04-15 (×5): 20 mg via ORAL
  Filled 2023-04-11 (×5): qty 2

## 2023-04-11 MED ORDER — PROSOURCE TF20 ENFIT COMPATIBL EN LIQD
60.0000 mL | Freq: Every day | ENTERAL | Status: DC
  Administered 2023-04-11 – 2023-04-12 (×2): 60 mL
  Filled 2023-04-11 (×2): qty 60

## 2023-04-11 MED ORDER — VITAL 1.5 CAL PO LIQD
1000.0000 mL | ORAL | Status: DC
  Administered 2023-04-11: 1000 mL

## 2023-04-11 MED ORDER — SODIUM CHLORIDE 0.9 % IV SOLN
1.0000 g | INTRAVENOUS | Status: DC
  Filled 2023-04-11: qty 10

## 2023-04-11 MED ORDER — DEXTROSE 50 % IV SOLN: 0.0000 mL | INTRAVENOUS | Status: DC | PRN

## 2023-04-11 MED ORDER — FENTANYL 2500MCG IN NS 250ML (10MCG/ML) PREMIX INFUSION
25.0000 ug/h | INTRAVENOUS | Status: DC
  Administered 2023-04-11: 50 ug/h via INTRAVENOUS
  Filled 2023-04-11: qty 250

## 2023-04-11 MED ORDER — ROCURONIUM BROMIDE 50 MG/5ML IV SOLN
80.0000 mg | Freq: Once | INTRAVENOUS | Status: AC
  Administered 2023-04-11: 80 mg via INTRAVENOUS
  Filled 2023-04-11: qty 8

## 2023-04-11 MED ORDER — HEPARIN SODIUM (PORCINE) 5000 UNIT/ML IJ SOLN
5000.0000 [IU] | Freq: Three times a day (TID) | INTRAMUSCULAR | Status: DC
  Administered 2023-04-11 – 2023-04-19 (×24): 5000 [IU] via SUBCUTANEOUS
  Filled 2023-04-11 (×25): qty 1

## 2023-04-11 MED ORDER — CHLORHEXIDINE GLUCONATE CLOTH 2 % EX PADS
6.0000 | MEDICATED_PAD | Freq: Every day | CUTANEOUS | Status: DC
  Administered 2023-04-12 – 2023-04-16 (×4): 6 via TOPICAL

## 2023-04-11 MED ORDER — PROPOFOL 1000 MG/100ML IV EMUL
0.0000 ug/kg/min | INTRAVENOUS | Status: DC
  Administered 2023-04-11: 10 ug/kg/min via INTRAVENOUS
  Administered 2023-04-11: 40 ug/kg/min via INTRAVENOUS
  Filled 2023-04-11: qty 100

## 2023-04-11 MED ORDER — SODIUM CHLORIDE 0.9 % IV SOLN
2.0000 g | INTRAVENOUS | Status: AC
  Administered 2023-04-12 – 2023-04-16 (×5): 2 g via INTRAVENOUS
  Filled 2023-04-11 (×5): qty 20

## 2023-04-11 MED ORDER — VANCOMYCIN HCL IN DEXTROSE 1-5 GM/200ML-% IV SOLN: 1000.0000 mg | Freq: Once | INTRAVENOUS | Status: DC

## 2023-04-11 MED ORDER — POLYETHYLENE GLYCOL 3350 17 G PO PACK: 17.0000 g | PACK | Freq: Every day | ORAL | Status: DC | PRN

## 2023-04-11 MED ORDER — FAMOTIDINE 20 MG PO TABS
20.0000 mg | ORAL_TABLET | Freq: Two times a day (BID) | ORAL | Status: DC
  Administered 2023-04-11: 20 mg
  Filled 2023-04-11 (×2): qty 1

## 2023-04-11 MED ORDER — ORAL CARE MOUTH RINSE
15.0000 mL | OROMUCOSAL | Status: DC
  Administered 2023-04-11 – 2023-04-12 (×11): 15 mL via OROMUCOSAL

## 2023-04-11 MED ORDER — METRONIDAZOLE 500 MG/100ML IV SOLN
500.0000 mg | Freq: Once | INTRAVENOUS | Status: AC
  Administered 2023-04-11: 500 mg via INTRAVENOUS
  Filled 2023-04-11: qty 100

## 2023-04-11 NOTE — Progress Notes (Signed)
ETT advanced by 2 from 22 to 24 per MD.

## 2023-04-11 NOTE — ED Provider Notes (Signed)
[place] EMERGENCY DEPARTMENT AT [facility] Provider Note   CSN: [id number] Arrival date & time: 04/11/23  1241     History  Chief Complaint  Patient presents with   Hypoglycemia    Madeline [name] is a 89 y.o. female.   Hypoglycemia Patient presents for unresponsiveness.  She arrives by EMS from home.  Per husband, she was last known well last night.  This morning, at 9 AM, she was found in her bed unresponsive.  EMS initially had readings of hypoglycemia in the 30s.  She received IV dextrose in an IV placed and left AC.  Glucose improved only slightly.  Second IV was placed in left hand.  She was given another amp of D50 and had normalization of blood glucose.  With her improvement in blood sugar, GCS improved from a 3 to a 6.  She has maintained spontaneous respirations since that time.  Prior to improvement in glucose, she would require intermittent BVM assistance with respirations.  She was kept on nonrebreather.  SpO2 was 100% on her supplemental oxygen.  Vital signs are otherwise notable for tachycardia and hypertension.  Per chart review, her medical history includes CAD, thrombocytopenia, heart block s/p pacemaker, DM, CHF, hypothyroidism, HLD.  History per son: Patient has been in her normal state of health lately, including last night.  She has reportedly had many episodes of hypoglycemia in the past.  Typically these are resolved at home with cake icing or resolved in the ED.  She usually returns to her mental baseline with normalization of her blood sugar.     Home Medications Prior to Admission medications   Medication Sig Start Date End Date Taking? Authorizing Provider  acetaminophen (TYLENOL) 325 MG tablet Take 1-2 tablets (325-650 mg total) by mouth every 6 (six) hours as needed for mild pain or moderate pain. 12/17/18   Barrett, Joline Salt, PA-C  atorvastatin (LIPITOR) 20 MG tablet Take 1 tablet (20 mg total) by mouth daily at 6 PM. 12/09/20   Pricilla Riffle,  MD  benzonatate (TESSALON PERLES) 100 MG capsule Take 1 capsule (100 mg total) by mouth 3 (three) times daily as needed for cough. 03/20/23 03/19/24  Cuthriell, Delorise Royals, PA-C  cetirizine (ZYRTEC) 10 MG tablet Take 10 mg by mouth daily.    [provider]  citalopram (CELEXA) 20 MG tablet Take 20 mg by mouth daily.    [provider]  Continuous Blood Gluc Receiver (FREESTYLE LIBRE 2 READER) DEVI Use to monitor blood glucose. 04/27/22   [provider]  CONTOUR NEXT TEST test strip USE 1 STRIP TO CHECK BLOOD SUGAR EIGHT TO NINE TIMES DAILY. 03/27/19   [provider]  insulin aspart (NOVOLOG) 100 UNIT/ML injection CBG 70 - 120: 0 units  CBG 121 - 150: 1 unit  CBG 151 - 200: 2 units  CBG 201 - 250: 3 units  CBG 251 - 300: 5 units  CBG 301 - 350: 7 units  CBG 351 - 400: 9 units 10/05/22   Kathlen Mody, MD  insulin glargine (LANTUS) 100 UNIT/ML injection Inject 0.15 mLs (15 Units total) into the skin at bedtime. Please do not use Lantus if the insulin pump is working. 10/05/22   Kathlen Mody, MD  Insulin Human (INSULIN PUMP) SOLN Inject 1 each into the skin continuous. Used with Novolog insulin.  Please do not use Lantus if the pump is working. 10/05/22   Kathlen Mody, MD  Insulin Syringes, Disposable, U-100 0.3 ML MISC  100 each by Does not apply route daily. 10/05/22   Kathlen Mody, MD  Lancets 28G MISC Use as instructed. 7-8 times daily. 06/02/19   [provider]  lisinopril (PRINIVIL,ZESTRIL) 20 MG tablet Take 1 tablet (20 mg total) by mouth daily. 09/22/18 10/04/22  Houston Siren, MD  Multiple Vitamin (MULTIVITAMIN WITH MINERALS) TABS tablet Take 1 tablet by mouth daily.    [provider]  nicotine (NICODERM CQ - DOSED IN MG/24 HOURS) 21 mg/24hr patch Place 1 patch (21 mg total) onto the skin daily. 10/06/22   Kathlen Mody, MD  NOVOLOG 100 UNIT/ML injection Inject 0-50 Units into the skin continuous. Up to 50 units/ day via pump 12/14/21    Sharman Cheek, MD  predniSONE (DELTASONE) 50 MG tablet Take 1 tablet (50 mg total) by mouth daily with breakfast. 03/20/23   Cuthriell, Delorise Royals, PA-C      Allergies    Darvon, Propoxyphene, and Sulfa antibiotics    Review of Systems   Review of Systems  Unable to perform ROS: Patient unresponsive    Physical Exam Updated Vital Signs BP (!) 160/65   Pulse 84   Temp 98.9 F (37.2 C) (Rectal)   Resp 20   SpO2 100%  Physical Exam Vitals and nursing note reviewed.  Constitutional:      Appearance: She is well-developed and normal weight. She is ill-appearing. She is not toxic-appearing or diaphoretic.  HENT:     Head: Normocephalic and atraumatic.     Right Ear: External ear normal.     Left Ear: External ear normal.     Nose: Nose normal.     Mouth/Throat:     Mouth: Mucous membranes are dry.     Pharynx: Oropharynx is clear.  Eyes:     Conjunctiva/sclera: Conjunctivae normal.     Comments: Roving eye movements.  Does not blink to threat.  Cardiovascular:     Rate and Rhythm: Regular rhythm. Tachycardia present.     Heart sounds: No murmur heard. Pulmonary:     Effort: Pulmonary effort is normal.     Breath sounds: Rhonchi present.  Abdominal:     General: There is no distension.     Palpations: Abdomen is soft.     Tenderness: There is no abdominal tenderness.  Musculoskeletal:        General: No deformity.     Cervical back: Neck supple.     Right lower leg: No edema.     Left lower leg: No edema.     Comments: Left upper extremity edema  Skin:    General: Skin is warm and dry.     Coloration: Skin is not jaundiced.  Neurological:     GCS: GCS eye subscore is 1. GCS verbal subscore is 2. GCS motor subscore is 4.     ED Results / Procedures / Treatments   Labs (all labs ordered are listed, but only abnormal results are displayed) Labs Reviewed  CBC WITH DIFFERENTIAL/PLATELET - Abnormal; Notable for the following components:      Result Value   WBC  12.8 (*)    Hemoglobin 11.8 (*)    Neutro Abs 10.3 (*)    Monocytes Absolute 1.4 (*)    Abs Immature Granulocytes 0.08 (*)    All other components within normal limits  COMPREHENSIVE METABOLIC PANEL - Abnormal; Notable for the following components:   Potassium 3.4 (*)    Glucose, Bld 137 (*)    Calcium 8.7 (*)  Total Protein 6.4 (*)    All other components within normal limits  MAGNESIUM - Abnormal; Notable for the following components:   Magnesium 2.8 (*)    All other components within normal limits  LACTIC ACID, PLASMA - Abnormal; Notable for the following components:   Lactic Acid, Venous 2.8 (*)    All other components within normal limits  BRAIN NATRIURETIC PEPTIDE - Abnormal; Notable for the following components:   B Natriuretic Peptide 189.7 (*)    All other components within normal limits  URINALYSIS, W/ REFLEX TO CULTURE (INFECTION SUSPECTED) - Abnormal; Notable for the following components:   Glucose, UA >=500 (*)    All other components within normal limits  I-STAT ARTERIAL BLOOD GAS, ED - Abnormal; Notable for the following components:   pCO2 arterial 52.6 (*)    pO2, Arterial 564 (*)    Bicarbonate 29.0 (*)    Acid-Base Excess 3.0 (*)    Potassium 3.3 (*)    HCT 34.0 (*)    Hemoglobin 11.6 (*)    All other components within normal limits  CBG MONITORING, ED - Abnormal; Notable for the following components:   Glucose-Capillary 184 (*)    All other components within normal limits  TROPONIN I (HIGH SENSITIVITY) - Abnormal; Notable for the following components:   Troponin I (High Sensitivity) 75 (*)    All other components within normal limits  CULTURE, BLOOD (ROUTINE X 2)  CULTURE, BLOOD (ROUTINE X 2)  AMMONIA  PROTIME-INR  TSH  LACTIC ACID, PLASMA  BLOOD GAS, ARTERIAL  CBC  CREATININE, SERUM  CBG MONITORING, ED  TROPONIN I (HIGH SENSITIVITY)    EKG EKG Interpretation  Date/Time:  Sunday Apr 11 2023 12:45:13 EDT Ventricular Rate:  113 PR  Interval:  189 QRS Duration: 134 QT Interval:  366 QTC Calculation: 502 R Axis:   35 Text Interpretation: Sinus or ectopic atrial tachycardia Ventricular premature complex Consider left atrial enlargement Left bundle branch block Confirmed by Gloris Manchester ([id number]) on 04/11/2023 1:08:27 PM  Radiology CT Head Wo Contrast  Result Date: 04/11/2023 CLINICAL DATA:  Altered mental status EXAM: CT HEAD WITHOUT CONTRAST TECHNIQUE: Contiguous axial images were obtained from the base of the skull through the vertex without intravenous contrast. RADIATION DOSE REDUCTION: This exam was performed according to the departmental dose-optimization program which includes automated exposure control, adjustment of the mA and/or kV according to patient size and/or use of iterative reconstruction technique. COMPARISON:  04/16/2022 FINDINGS: Brain: No evidence of acute infarction, hemorrhage, hydrocephalus, extra-axial collection or mass lesion/mass effect. Periventricular and deep white matter hypodensity. Vascular: No hyperdense vessel or unexpected calcification. Skull: Normal. Negative for fracture or focal lesion. Sinuses/Orbits: No acute finding. Other: None. IMPRESSION: No acute intracranial pathology. Small-vessel white matter disease. Electronically Signed   By: Jearld Lesch M.D.   On: 04/11/2023 13:35   DG Chest Portable 1 View  Result Date: 04/11/2023 CLINICAL DATA:  Intubation EXAM: PORTABLE CHEST 1 VIEW COMPARISON:  03/20/2023 FINDINGS: Interval placement of endotracheal tube, tip just below the thoracic inlet. Interval placement of esophagogastric tube, tip and side port below the diaphragm. Mild cardiomegaly. Left chest multi lead pacer. Diffuse bilateral interstitial pulmonary opacity. Osseous structures unremarkable. IMPRESSION: 1. Interval placement of endotracheal tube, tip just below the thoracic inlet. 2. Interval placement of esophagogastric tube, tip and side port below the diaphragm. 3. Diffuse bilateral  interstitial pulmonary opacity, consistent with edema or infection. Electronically Signed   By: Jearld Lesch M.D.   On: 04/11/2023  13:14    Procedures Procedure Name: Intubation Date/Time: 04/11/2023 1:00 PM  Performed by: Gloris Manchester, MDPre-anesthesia Checklist: Patient being monitored and Suction available Oxygen Delivery Method: Ambu bag Preoxygenation: Pre-oxygenation with 100% oxygen Induction Type: Rapid sequence Ventilation: Mask ventilation without difficulty Laryngoscope Size: Glidescope and 3 Grade View: Grade II Tube size: 7.5 mm Number of attempts: 1 Airway Equipment and Method: Rigid stylet and Video-laryngoscopy Placement Confirmation: ETT inserted through vocal cords under direct vision, CO2 detector and Breath sounds checked- equal and bilateral Secured at: 21 cm Tube secured with: ETT holder Dental Injury: Teeth and Oropharynx as per pre-operative assessment         Medications Ordered in ED Medications  fentaNYL in NS (33mcg/ml) infusion-PREMIX (100 mcg/hr Intravenous Rate/Dose Change 04/11/23 1435)  fentaNYL (SUBLIMAZE) bolus via infusion 25-100 mcg (100 mcg Intravenous Bolus from Bag 04/11/23 1454)  propofol (DIPRIVAN) 1000 MG/100ML infusion (50 mcg/kg/min  65 kg (Order-Specific) Intravenous Rate/Dose Change 04/11/23 1435)  clevidipine (CLEVIPREX) infusion 0.5 mg/mL (2 mg/hr Intravenous New Bag/Given 04/11/23 1442)  dextrose 10 % infusion ( Intravenous New Bag/Given 04/11/23 1406)  ceFEPIme (MAXIPIME) 2 g in sodium chloride 0.9 % 100 mL IVPB (has no administration in time range)  metroNIDAZOLE (FLAGYL) IVPB 500 mg (500 mg Intravenous New Bag/Given 04/11/23 1455)  vancomycin (VANCOCIN) IVPB 1000 mg/200 mL premix (has no administration in time range)  docusate (COLACE) 50 MG/5ML liquid 100 mg (has no administration in time range)  polyethylene glycol (MIRALAX / GLYCOLAX) packet 17 g (has no administration in time range)  heparin injection 5,000 Units  (has no administration in time range)  famotidine (PEPCID) tablet 20 mg (has no administration in time range)  etomidate (AMIDATE) injection 20 mg (20 mg Intravenous Given 04/11/23 1247)  rocuronium (ZEMURON) injection 80 mg (80 mg Intravenous Given 04/11/23 1247)  lactated ringers bolus 1,000 mL (0 mLs Intravenous Stopped 04/11/23 1335)    ED Course/ Medical Decision Making/ A&P                             Medical Decision Making Amount and/or Complexity of Data Reviewed Labs: ordered. Radiology: ordered. ECG/medicine tests: ordered.  Risk Prescription drug management. Decision regarding hospitalization.   This patient presents to the ED for concern of unresponsive, this involves an extensive number of treatment options, and is a complaint that carries with it a high risk of complications and morbidity.  The differential diagnosis includes CVA, ICH, hypoglycemic brain injury, anoxic brain injury, seizures, ingestion   Co morbidities that complicate the patient evaluation  CAD, thrombocytopenia, heart block s/p pacemaker, DM, CHF, hypothyroidism, HLD   Additional history obtained:  Additional history obtained from EMS, patient's son External records from outside source obtained and reviewed including EMR   Lab Tests:  I Ordered, and personally interpreted labs.  The pertinent results include: Mild hypokalemia and hypomagnesemia with otherwise normal electrolytes, mild leukocytosis, baseline anemia, mild elevations in troponin and BNP, initial lactate elevated at 2.8.   Imaging Studies ordered:  I ordered imaging studies including chest x-ray, CT head I independently visualized and interpreted imaging which showed no acute findings on CT head.  Chest x-ray showed diffuse bilateral pulmonary opacities. I agree with the radiologist interpretation   Cardiac Monitoring: / EKG:  The patient was maintained on a cardiac monitor.  I personally viewed and interpreted the cardiac  monitored which showed an underlying rhythm of: Sinus rhythm   Consultations Obtained:  I requested consultation with the critical care,  and discussed lab and imaging findings as well as pertinent plan - they recommend: Admission to ICU   Problem List / ED Course / Critical interventions / Medication management  Patient presenting for unresponsiveness.  This was first identified by husband at 9 AM this morning.  At the time, she was in bed.  No trauma suspected.  Last known well was last night.  Patient was initially a GCS of 3 with EMS.  She was found to have hypoglycemia with CBG in the 30s.  EMS gave IV dextrose and patient did have normalization of CBG prior to arrival.  GCS reportedly improved to a 6.  On arrival, patient is moving bilateral extremities.  She is moaning.  She does withdraw from painful stimuli.  GCS is 7.  Patient was intubated for airway protection.  CBG was confirmed to be normal on her arrival.  She was taken to CT scanner for assessment of intracranial hemorrhage and/or CVA.  CT did not reveal any acute findings.  Although patient's blood pressure improved after intubation, she had recurrence of hypertension.  Sedating fentanyl and propofol were increased.  Lab work was notable for a leukocytosis and lactic acidosis.  Additionally, patient does have evidence of possible infection on chest x-ray.  Broad-spectrum antibiotics were ordered.  Her son arrived in the ED and joined her at bedside.  He was able to provide further history.  He confirms that she was in her normal state of health last night.  She has not had any recent complaints.  He states that she does have recurrent episodes of hypoglycemia but will typically return to mental baseline with normalization of blood sugar.  Given that her unresponsiveness was identified this morning, it is unclear how long she may have been hypoglycemic.  She may have developed brain injury from this and may have had undetected seizures  overnight.  I spoke with critical care, who will admit patient. I ordered medication including IV fluids for hydration; etomidate and rocuronium for RSI; fentanyl and propofol for postintubation sedation; dextrose for maintenance of normoglycemia; broad-spectrum antibiotics for empiric treatment of sepsis; Cleviprex for HTN. Reevaluation of the patient after these medicines showed that the patient improved I have reviewed the patients home medicines and have made adjustments as needed   Social Determinants of Health:  Lives at home with husband   CRITICAL CARE Performed by: Gloris Manchester   Total critical care time: 35 minutes  Critical care time was exclusive of separately billable procedures and treating other patients.  Critical care was necessary to treat or prevent imminent or life-threatening deterioration.  Critical care was time spent personally by me on the following activities: development of treatment plan with patient and/or surrogate as well as nursing, discussions with consultants, evaluation of patient's response to treatment, examination of patient, obtaining history from patient or surrogate, ordering and performing treatments and interventions, ordering and review of laboratory studies, ordering and review of radiographic studies, pulse oximetry and re-evaluation of patient's condition.         Final Clinical Impression(s) / ED Diagnoses Final diagnoses:  Hypoglycemia  Unresponsive    Rx / DC Orders ED Discharge Orders     None         Gloris Manchester, MD 04/11/23 1521

## 2023-04-11 NOTE — Progress Notes (Signed)
Pt transported from TRA A to 2M13 on vent without complications.

## 2023-04-11 NOTE — Progress Notes (Signed)
Transported from trauma room A to CT and back without complications

## 2023-04-11 NOTE — Progress Notes (Addendum)
Pharmacy Antibiotic Note  Madeline [name] is a 89 y.o. female for which pharmacy has been consulted for cefepime and vancomycin dosing for sepsis.  Patient with a history of T2DM, CAD, dyslipidemia, GERD, hypothyroidism, dual cardiac pacemaker. Patient presenting with hypoglycemia. Intubated in the ED.  SCr 0.86  WBC 12.8; LA 2.8; T 98.9; HR 89; RR 18  Plan: Vancomycin, cefepime, flagyl given in ED CCM now planning for Rocephin Trend WBC, Fever, Renal function F/u cultures, clinical course, WBC De-escalate when able     Temp (24hrs), Avg:98.9 F (37.2 C), Min:98.9 F (37.2 C), Max:98.9 F (37.2 C)  Recent Labs  Lab 04/11/23 1242 04/11/23 1258  WBC 12.8*  --   CREATININE 0.86  --   LATICACIDVEN  --  2.8*    CrCl cannot be calculated (Unknown ideal weight.).    Allergies  Allergen Reactions   Darvon     "FEEL SICK"   Propoxyphene Nausea Only and Nausea And Vomiting   Sulfa Antibiotics Nausea Only and Nausea And Vomiting    "FEEL SICK"    Microbiology results: Pending  Thank you for allowing pharmacy to be a part of this patient's care.  Delmar Landau, PharmD, BCPS 04/11/2023 2:47 PM ED Clinical Pharmacist -  [phone]

## 2023-04-11 NOTE — ED Triage Notes (Signed)
Pt brought in by EMS from home with complaints of unresponsive/snoring respirations; LKN last night. Pt has an insulin pump that was running which EMS turned off after finding her BG at 39.  EMS gave 50grams dextrose en route and BG increased to 144.   Pt's GCS still has not improved and snoring respirations still present. GCS of 6

## 2023-04-11 NOTE — Progress Notes (Signed)
eLink Physician-Brief Progress Note Patient Name: CHERYEL COLOMB DOB: 1934/09/24 MRN: [id number]   Date of Service  04/11/2023  HPI/Events of Note  Temperature 100.4 F.  Bedside RN asking for Tylenol.  eICU Interventions  Tylenol ordered for temperature greater than 101 F.     Intervention Category Evaluation Type: Other  Carilyn Goodpasture 04/11/2023, 11:38 PM

## 2023-04-11 NOTE — ED Notes (Signed)
Allergies verified with patient family

## 2023-04-11 NOTE — Progress Notes (Signed)
eLink Physician-Brief Progress Note Patient Name: RANNAH HODGKINS DOB: 1934/05/25 MRN: [id number]   Date of Service  04/11/2023  HPI/Events of Note  89 year old female with multiple admissions for hypoglycemia presented again with hypoglycemia and altered mental status.  Blood sugar is now in the 400s even without initiation of tube feeds.  About to start tube feeds.  eICU Interventions  Start insulin drip for glycemic control.      Intervention Category Intermediate Interventions: Hyperglycemia - evaluation and treatment  Carilyn Goodpasture 04/11/2023, 8:22 PM

## 2023-04-11 NOTE — H&P (Signed)
NAME:  Madeline [name], MRN:  [id number], DOB:  02-Aug-1934, LOS: 0 ADMISSION DATE:  04/11/2023, CONSULTATION DATE:  04/11/2023 REFERRING MD:  Gloris Manchester MD CHIEF COMPLAINT:  Unresponsiveness and hypoglycemia   History of Present Illness:  Patient is a 89 yr old female with significant past medical history of DM2, CAD, Dyslipidemia, GERD, hypothyroidism, and has a dual cardiac pacemaker, who was brought in by EMS due to severe hypoglycemia. Patient's blood glucose per CBG was 39 and EMS turned off insulin pump then gave 1 amp of D50 in route. CBG rebounded to 144. Patient failed to regain consciousness after CBG corrected and had GCS of 6. Patient was admitted upon admission to ED. Patient was in her normal state of health per family last night that are currently at beside. LNK per family was approximately 2030 on 5/25. Per patient's son and daughter at beside patient has had issues with managing her blood sugar and has had episodes of hypoglycemia. Patient's husband found her unresponsive sometime before 1100 this morning before EMS was called. PCCM was consulted to manage patient.   Pertinent  Medical History   Past Medical History:  Diagnosis Date   Bradycardia    Coronary artery disease    Diabetes mellitus    Dyslipidemia    GERD (gastroesophageal reflux disease)    Hypothyroid    Presence of permanent cardiac pacemaker 12/16/2018   Medtronic dual lead pacemaker     Significant Hospital Events: Including procedures, antibiotic start and stop dates in addition to other pertinent events   5/26 Admitted for unresponsiveness due to severe hypoglycemia, intubated for airway protection   Interim History / Subjective:  Intubated, sedated, family at beside  Objective   Blood pressure (!) 175/68, pulse 83, temperature 98.9 F (37.2 C), temperature source Rectal, resp. rate 20, SpO2 100 %.    Vent Mode: PRVC FiO2 (%):  [100 %] 100 % Set Rate:  [22 bmp] 22 bmp Vt Set:  [400 mL] 400  mL PEEP:  [5 cmH20] 5 cmH20 Plateau Pressure:  [16 cmH20] 16 cmH20   Intake/Output Summary (Last 24 hours) at 04/11/2023 1508 Last data filed at 04/11/2023 1335 Gross per 24 hour  Intake 1000 ml  Output --  Net 1000 ml   There were no vitals filed for this visit.  Examination: General: chronically ill appearing older adult female, lying in ICU bed  HENT: Normocephalic, ETT in place/OG in place Cardio: s1, s2, auscultated, RRR, no m/r/g Pulm: Rhonchi ausculted in upper lung fields, diminished throughout, no respiratory distress Abd: BS active  GU: intact  Skin: warm, dry, no wounds or lesions Extremities: no edema Neuro: RASS -4, sedated, does not respond to painful stimuli   Resolved Hospital Problem list   N/a   Assessment & Plan:  Toxic metabolic encephalopathy in the setting of hypoglycemia,  -Suspicion of anoxic brain injury due unknown length of time down with severe hypoglycemia  -CT of head negative for acute intracranial abnormality  P: MRI brain  Consider neuro consult in AM Maintain neuro protective measures; goal for eurothermia, euglycemia, eunatermia, normoxia, and PCO2 goal of 35-40 Nutrition and bowel regiment  Seizure precautions  Aspirations precautions  Decrease sedation to a RASS of 0 to -1, to evaluate neuro status   Hypoglycemic Crisis/AMS Type 2 diabetes with insulin pump at baseline  -No current events of hypoglycemia upon admission P: Moderate scale SSI  Hold off long acting insulin for now  Monitor CBGs q4 Monitor for hypoglycemia, treat  per protocol  Aspiration PNA in setting of AMS  Intubated for Airway Protection  P: Continue ventilator support with lung protective strategies  Wean PEEP and FiO2 for sats greater than 90%. Head of bed elevated 30 degrees. Plateau pressures less than 30 cm H20.  Follow intermittent chest x-ray and ABG.   SAT/SBT as tolerated Ensure adequate pulmonary hygiene, VAP bundle, PAD protocol Decrease sedation  as tolerated Ceftriaxone for aspiration coverage   HFpEF -ECHO 12/2021 with EF 60-65%. No WMA, and grade 1 diastolic dysfunction  Elevated BNP HTN  On cleviprex  P: Continue to utilize cleviprex, SBP goal <150 systolic  Monitor BP with VS per ICU protocol  Resume home antihypertensives once med recc completed   Repeat ECHO  Strict intake and output  Daily weight   GERD hx P: Initiate protonix 40mg  daily   Dyslipidemia hx P: Resume 20mg  atorvastatin   Hypokalemia  P: Trend bmet supplement   Best Practice (right click and "Reselect all SmartList Selections" daily)   Diet/type: NPO w/ oral meds DVT prophylaxis: prophylactic heparin  GI prophylaxis: PPI Lines: N/A Foley:  N/A Code Status:  full code Last date of multidisciplinary goals of care discussion [ Family updated at beside ]  Labs   CBC: Recent Labs  Lab 04/11/23 1242 04/11/23 1310  WBC 12.8*  --   NEUTROABS 10.3*  --   HGB 11.8* 11.6*  HCT 36.6 34.0*  MCV 90.8  --   PLT 295  --     Basic Metabolic Panel: Recent Labs  Lab 04/11/23 1242 04/11/23 1310  NA 138 138  K 3.4* 3.3*  CL 101  --   CO2 26  --   GLUCOSE 137*  --   BUN 17  --   CREATININE 0.86  --   CALCIUM 8.7*  --   MG 2.8*  --    GFR: CrCl cannot be calculated (Unknown ideal weight.). Recent Labs  Lab 04/11/23 1242 04/11/23 1258  WBC 12.8*  --   LATICACIDVEN  --  2.8*    Liver Function Tests: Recent Labs  Lab 04/11/23 1242  AST 31  ALT 30  ALKPHOS 88  BILITOT 0.7  PROT 6.4*  ALBUMIN 3.5   No results for input(s): "LIPASE", "AMYLASE" in the last 168 hours. Recent Labs  Lab 04/11/23 1242  AMMONIA 14    ABG    Component Value Date/Time   PHART 7.350 04/11/2023 1310   PCO2ART 52.6 (H) 04/11/2023 1310   PO2ART 564 (H) 04/11/2023 1310   HCO3 29.0 (H) 04/11/2023 1310   TCO2 31 04/11/2023 1310   ACIDBASEDEF 4.1 (H) 10/04/2022 1218   O2SAT 100 04/11/2023 1310     Coagulation Profile: Recent Labs  Lab  04/11/23 1258  INR 1.0    Cardiac Enzymes: No results for input(s): "CKTOTAL", "CKMB", "CKMBINDEX", "TROPONINI" in the last 168 hours.  HbA1C: Hemoglobin A1C  Date/Time Value Ref Range Status  03/24/2019 12:00 AM 7.9  Final   Hgb A1c MFr Bld  Date/Time Value Ref Range Status  12/15/2021 05:50 PM 8.2 (H) 4.8 - 5.6 % Final    Comment:    (NOTE)         Prediabetes: 5.7 - 6.4         Diabetes: >6.4         Glycemic control for adults with diabetes: <7.0   08/07/2019 08:57 AM 8.0 (H) 4.8 - 5.6 % Final    Comment:  Prediabetes: 5.7 - 6.4          Diabetes: >6.4          Glycemic control for adults with diabetes: <7.0     CBG: Recent Labs  Lab 04/11/23 1346  GLUCAP 78    Review of Systems:   Patient intubated, sedated, UTA  Past Medical History:  She,  has a past medical history of Bradycardia, Coronary artery disease, Diabetes mellitus, Dyslipidemia, GERD (gastroesophageal reflux disease), Hypothyroid, and Presence of permanent cardiac pacemaker (12/16/2018).   Surgical History:   Past Surgical History:  Procedure Laterality Date   APPENDECTOMY     BREAST BIOPSY Right 1967   EXCISIONAL - NEG   CHOLECYSTECTOMY     CORONARY STENT INTERVENTION     INSERT / REPLACE / REMOVE PACEMAKER  12/16/2018   no surgical hx     PACEMAKER IMPLANT N/A 12/16/2018   Procedure: PACEMAKER IMPLANT;  Surgeon: Marinus Maw, MD;  Location: [facility];  Service: Cardiovascular;  Laterality: N/A;     Social History:   reports that she has been smoking cigarettes. She has a 50.00 pack-year smoking history. She has never used smokeless tobacco. She reports that she does not drink alcohol and does not use drugs.   Family History:  Her family history includes Breast cancer (age of onset: 21) in her sister; Breast cancer (age of onset: 3) in her sister; Cancer in her sister, sister, and sister; Diabetes in her son; Heart Problems in her mother; Other in her father.    Allergies Allergies  Allergen Reactions   Darvon     "FEEL SICK"   Propoxyphene Nausea Only and Nausea And Vomiting   Sulfa Antibiotics Nausea Only and Nausea And Vomiting    "FEEL SICK"      Home Medications  Prior to Admission medications   Medication Sig Start Date End Date Taking? Authorizing Provider  acetaminophen (TYLENOL) 325 MG tablet Take 1-2 tablets (325-650 mg total) by mouth every 6 (six) hours as needed for mild pain or moderate pain. 12/17/18   Barrett, Joline Salt, PA-C  atorvastatin (LIPITOR) 20 MG tablet Take 1 tablet (20 mg total) by mouth daily at 6 PM. 12/09/20   Pricilla Riffle, MD  benzonatate (TESSALON PERLES) 100 MG capsule Take 1 capsule (100 mg total) by mouth 3 (three) times daily as needed for cough. 03/20/23 03/19/24  Cuthriell, Delorise Royals, PA-C  cetirizine (ZYRTEC) 10 MG tablet Take 10 mg by mouth daily.    [provider]  citalopram (CELEXA) 20 MG tablet Take 20 mg by mouth daily.    [provider]  Continuous Blood Gluc Receiver (FREESTYLE LIBRE 2 READER) DEVI Use to monitor blood glucose. 04/27/22   [provider]  CONTOUR NEXT TEST test strip USE 1 STRIP TO CHECK BLOOD SUGAR EIGHT TO NINE TIMES DAILY. 03/27/19   [provider]  insulin aspart (NOVOLOG) 100 UNIT/ML injection CBG 70 - 120: 0 units  CBG 121 - 150: 1 unit  CBG 151 - 200: 2 units  CBG 201 - 250: 3 units  CBG 251 - 300: 5 units  CBG 301 - 350: 7 units  CBG 351 - 400: 9 units 10/05/22   Kathlen Mody, MD  insulin glargine (LANTUS) 100 UNIT/ML injection Inject 0.15 mLs (15 Units total) into the skin at bedtime. Please do not use Lantus if the insulin pump is working. 10/05/22   Kathlen Mody, MD  Insulin Human (INSULIN PUMP) SOLN Inject  1 each into the skin continuous. Used with Novolog insulin.  Please do not use Lantus if the pump is working. 10/05/22   Kathlen Mody, MD  Insulin Syringes, Disposable, U-100 0.3 ML MISC 100 each by Does not apply route daily.  10/05/22   Kathlen Mody, MD  Lancets 28G MISC Use as instructed. 7-8 times daily. 06/02/19   [provider]  lisinopril (PRINIVIL,ZESTRIL) 20 MG tablet Take 1 tablet (20 mg total) by mouth daily. 09/22/18 10/04/22  Houston Siren, MD  Multiple Vitamin (MULTIVITAMIN WITH MINERALS) TABS tablet Take 1 tablet by mouth daily.    [provider]  nicotine (NICODERM CQ - DOSED IN MG/24 HOURS) 21 mg/24hr patch Place 1 patch (21 mg total) onto the skin daily. 10/06/22   Kathlen Mody, MD  NOVOLOG 100 UNIT/ML injection Inject 0-50 Units into the skin continuous. Up to 50 units/ day via pump 12/14/21   Sharman Cheek, MD  predniSONE (DELTASONE) 50 MG tablet Take 1 tablet (50 mg total) by mouth daily with breakfast. 03/20/23   Cuthriell, Delorise Royals, PA-C     Critical care time: 28    CRITICAL CARE Performed by: Jamy Cleckler D. Harris S-ACNP    Total critical care time: 40 minutes  Critical care time was exclusive of separately billable procedures and treating other patients.  Critical care was necessary to treat or prevent imminent or life-threatening deterioration.  Critical care was time spent personally by me on the following activities: development of treatment plan with patient and/or surrogate as well as nursing, discussions with consultants, evaluation of patient's response to treatment, examination of patient, obtaining history from patient or surrogate, ordering and performing treatments and interventions, ordering and review of laboratory studies, ordering and review of radiographic studies, pulse oximetry and re-evaluation of patient's condition.   Lin Glazier D. Harris, NP-C [facility] Personal contact information can be found on Amion  If no contact or response made please call [phone] 04/11/2023, 4:49 PM

## 2023-04-12 ENCOUNTER — Inpatient Hospital Stay (HOSPITAL_COMMUNITY): Payer: PPO

## 2023-04-12 DIAGNOSIS — E162 Hypoglycemia, unspecified: Secondary | ICD-10-CM | POA: Diagnosis not present

## 2023-04-12 DIAGNOSIS — R0609 Other forms of dyspnea: Secondary | ICD-10-CM

## 2023-04-12 LAB — GLUCOSE, CAPILLARY
Glucose-Capillary: 125 mg/dL — ABNORMAL HIGH (ref 70–99)
Glucose-Capillary: 132 mg/dL — ABNORMAL HIGH (ref 70–99)
Glucose-Capillary: 142 mg/dL — ABNORMAL HIGH (ref 70–99)
Glucose-Capillary: 150 mg/dL — ABNORMAL HIGH (ref 70–99)
Glucose-Capillary: 157 mg/dL — ABNORMAL HIGH (ref 70–99)
Glucose-Capillary: 163 mg/dL — ABNORMAL HIGH (ref 70–99)
Glucose-Capillary: 168 mg/dL — ABNORMAL HIGH (ref 70–99)
Glucose-Capillary: 170 mg/dL — ABNORMAL HIGH (ref 70–99)
Glucose-Capillary: 170 mg/dL — ABNORMAL HIGH (ref 70–99)
Glucose-Capillary: 177 mg/dL — ABNORMAL HIGH (ref 70–99)
Glucose-Capillary: 180 mg/dL — ABNORMAL HIGH (ref 70–99)
Glucose-Capillary: 184 mg/dL — ABNORMAL HIGH (ref 70–99)
Glucose-Capillary: 191 mg/dL — ABNORMAL HIGH (ref 70–99)
Glucose-Capillary: 214 mg/dL — ABNORMAL HIGH (ref 70–99)
Glucose-Capillary: 215 mg/dL — ABNORMAL HIGH (ref 70–99)
Glucose-Capillary: 230 mg/dL — ABNORMAL HIGH (ref 70–99)
Glucose-Capillary: 283 mg/dL — ABNORMAL HIGH (ref 70–99)
Glucose-Capillary: 304 mg/dL — ABNORMAL HIGH (ref 70–99)

## 2023-04-12 LAB — ECHOCARDIOGRAM LIMITED
AR max vel: 1.61 cm2
AV Area VTI: 2.01 cm2
AV Area mean vel: 1.58 cm2
AV Mean grad: 7.6 mmHg
AV Peak grad: 13.4 mmHg
Ao pk vel: 1.83 m/s
Area-P 1/2: 4.06 cm2
S' Lateral: 2.3 cm
Weight: 2017.65 oz

## 2023-04-12 LAB — CBC WITH DIFFERENTIAL/PLATELET
Abs Immature Granulocytes: 0.04 10*3/uL (ref 0.00–0.07)
Basophils Absolute: 0 10*3/uL (ref 0.0–0.1)
Basophils Relative: 0 %
Eosinophils Absolute: 0 10*3/uL (ref 0.0–0.5)
Eosinophils Relative: 0 %
HCT: 34.1 % — ABNORMAL LOW (ref 36.0–46.0)
Hemoglobin: 10.9 g/dL — ABNORMAL LOW (ref 12.0–15.0)
Immature Granulocytes: 0 %
Lymphocytes Relative: 11 %
Lymphs Abs: 1.1 10*3/uL (ref 0.7–4.0)
MCH: 29.5 pg (ref 26.0–34.0)
MCHC: 32 g/dL (ref 30.0–36.0)
MCV: 92.4 fL (ref 80.0–100.0)
Monocytes Absolute: 1.4 10*3/uL — ABNORMAL HIGH (ref 0.1–1.0)
Monocytes Relative: 14 %
Neutro Abs: 7.9 10*3/uL — ABNORMAL HIGH (ref 1.7–7.7)
Neutrophils Relative %: 75 %
Platelets: 232 10*3/uL (ref 150–400)
RBC: 3.69 MIL/uL — ABNORMAL LOW (ref 3.87–5.11)
RDW: 13.9 % (ref 11.5–15.5)
WBC: 10.5 10*3/uL (ref 4.0–10.5)
nRBC: 0 % (ref 0.0–0.2)

## 2023-04-12 LAB — MAGNESIUM
Magnesium: 2.4 mg/dL (ref 1.7–2.4)
Magnesium: 2.3 mg/dL (ref 1.7–2.4)

## 2023-04-12 LAB — PHOSPHORUS
Phosphorus: 4.7 mg/dL — ABNORMAL HIGH (ref 2.5–4.6)
Phosphorus: 3.5 mg/dL (ref 2.5–4.6)

## 2023-04-12 LAB — CULTURE, BLOOD (ROUTINE X 2): Culture: NO GROWTH

## 2023-04-12 LAB — BASIC METABOLIC PANEL
Anion gap: 8 (ref 5–15)
BUN: 26 mg/dL — ABNORMAL HIGH (ref 8–23)
CO2: 25 mmol/L (ref 22–32)
Calcium: 8 mg/dL — ABNORMAL LOW (ref 8.9–10.3)
Chloride: 99 mmol/L (ref 98–111)
Creatinine, Ser: 1.09 mg/dL — ABNORMAL HIGH (ref 0.44–1.00)
GFR, Estimated: 49 mL/min — ABNORMAL LOW (ref >60)
Glucose, Bld: 178 mg/dL — ABNORMAL HIGH (ref 70–99)
Potassium: 4.7 mmol/L (ref 3.5–5.1)
Sodium: 132 mmol/L — ABNORMAL LOW (ref 135–145)

## 2023-04-12 LAB — TRIGLYCERIDES: Triglycerides: 92 mg/dL (ref <150)

## 2023-04-12 MED ORDER — ORAL CARE MOUTH RINSE: 15.0000 mL | OROMUCOSAL | Status: DC | PRN

## 2023-04-12 MED ORDER — ALBUMIN HUMAN 25 % IV SOLN
INTRAVENOUS | Status: AC
  Filled 2023-04-12: qty 50

## 2023-04-12 MED ORDER — INSULIN GLARGINE-YFGN 100 UNIT/ML ~~LOC~~ SOLN
5.0000 [IU] | Freq: Every day | SUBCUTANEOUS | Status: DC
  Administered 2023-04-12 – 2023-04-13 (×2): 5 [IU] via SUBCUTANEOUS
  Filled 2023-04-12 (×2): qty 0.05

## 2023-04-12 MED ORDER — SODIUM CHLORIDE 0.9 % IV SOLN
500.0000 mg | INTRAVENOUS | Status: AC
  Administered 2023-04-12 – 2023-04-14 (×3): 500 mg via INTRAVENOUS
  Filled 2023-04-12 (×3): qty 5

## 2023-04-12 MED ORDER — VITAL 1.5 CAL PO LIQD: 1000.0000 mL | ORAL | Status: DC

## 2023-04-12 MED ORDER — FAMOTIDINE 20 MG PO TABS: 10.0000 mg | ORAL_TABLET | Freq: Every day | ORAL | Status: DC

## 2023-04-12 MED ORDER — LACTATED RINGERS IV BOLUS
500.0000 mL | Freq: Once | INTRAVENOUS | Status: AC
  Administered 2023-04-12: 500 mL via INTRAVENOUS

## 2023-04-12 MED ORDER — INSULIN ASPART 100 UNIT/ML IJ SOLN
0.0000 [IU] | INTRAMUSCULAR | Status: DC
  Administered 2023-04-13 (×2): 2 [IU] via SUBCUTANEOUS
  Administered 2023-04-13: 3 [IU] via SUBCUTANEOUS

## 2023-04-12 MED ORDER — VANCOMYCIN HCL 1250 MG/250ML IV SOLN
1250.0000 mg | Freq: Once | INTRAVENOUS | Status: AC
  Administered 2023-04-12: 1250 mg via INTRAVENOUS
  Filled 2023-04-12: qty 250

## 2023-04-12 MED ORDER — VANCOMYCIN HCL 750 MG/150ML IV SOLN: 750.0000 mg | INTRAVENOUS | Status: DC

## 2023-04-12 MED ORDER — ALBUMIN HUMAN 5 % IV SOLN
25.0000 g | Freq: Once | INTRAVENOUS | Status: AC
  Administered 2023-04-12: 25 g via INTRAVENOUS
  Filled 2023-04-12: qty 500

## 2023-04-12 MED ORDER — NICOTINE 14 MG/24HR TD PT24
14.0000 mg | MEDICATED_PATCH | Freq: Every day | TRANSDERMAL | Status: DC
  Administered 2023-04-12 – 2023-04-19 (×8): 14 mg via TRANSDERMAL
  Filled 2023-04-12 (×8): qty 1

## 2023-04-12 NOTE — Progress Notes (Signed)
Initial Nutrition Assessment  DOCUMENTATION CODES:  Not applicable  INTERVENTION:  Tube Feeds via OG-tube: Vital 1.5 at 20 ml/hr, advance by 10 mL q4h to goal rate of 45 mL/hr. 60 mL ProSource TF20 - Daily Tube feeds at goal provides 1700 kcal, 93 gm protein, and 825 mL free water daily.   NUTRITION DIAGNOSIS:  Inadequate oral intake related to inability to eat as evidenced by NPO status.  GOAL:  Patient will meet greater than or equal to 90% of their needs  MONITOR:  Vent status, Weight trends, I & O's, TF tolerance  REASON FOR ASSESSMENT:  Consult Enteral/tube feeding initiation and management  ASSESSMENT:  89 y.o. female presented to the ED with hypoglycemia. PMH includes GERD, CAD, COPD, CHF, malnutrition, and DM. Pt admitted with toxic metabolic encephalopathy 2/2 hypoglycemia and aspiration PNA.   5/26 - Admitted; intubated   Daughter at bedside, able to provide some nutrition history. Reports that pt does not eat a lot. Will eat a hot dog and beverage for lunch and a sandwich for dinner. Occasionally will drink an oral nutrition supplement, but no regularly.  RD discussed the use of current feeding tube in place, daughter inquired about nutrition if the ETT was removed. Explained that the feeding tube would also be removed, but a feeding tube can be placed in the nare if needed.  Unsure about any weight changes. Does not use any assistance when ambulating.  Per EMR, pt with a weight gain over the past year.   Pt with OG in place and tube feeds currently running at 20 mL/hr. RD asked MD to obtain abdominal x-ray to confirm placement; MD ok with abdominal x-ray being ordered.   Patient is currently intubated on ventilator support. MV: 6.2 L/min MAP (cuff): 72 Temp (24hrs), Avg:100.1 F (37.8 C), Min:98.9 F (37.2 C), Max:100.8 F (38.2 C)  Drips Fentanyl Insulin  Propofol 15.6 ml/hr (412 kcal per day)  Medications reviewed and include: Pepcid, Protonix, IV  antibiotics  Labs reviewed: Sodium 132, Potassium 4.7, BUN 26, Creatinine 1.09, Phosphorus 4.7, Magnesium 2.4 CBG: 78-535 x 24 hrs   UOP: 1255 mL x 24 hours   NUTRITION - FOCUSED PHYSICAL EXAM:  Flowsheet Row Most Recent Value  Orbital Region Unable to assess  Upper Arm Region No depletion  Thoracic and Lumbar Region No depletion  Buccal Region Unable to assess  Temple Region Mild depletion  Clavicle Bone Region Mild depletion  Clavicle and Acromion Bone Region Mild depletion  Scapular Bone Region Mild depletion  Dorsal Hand Mild depletion  Patellar Region Mild depletion  Anterior Thigh Region Mild depletion  Posterior Calf Region Mild depletion  Edema (RD Assessment) None  Hair Reviewed  Eyes Unable to assess  Mouth Unable to assess  Skin Reviewed  Nails Reviewed   Diet Order:   Diet Order     None      EDUCATION NEEDS: Not appropriate for education at this time  Skin:  Skin Assessment: Reviewed RN Assessment  Last BM:  Unknown/PTA  Height:  Ht Readings from Last 1 Encounters:  03/20/23 5\' 2"  (1.575 m)   Weight:  Wt Readings from Last 1 Encounters:  04/12/23 57.2 kg   Ideal Body Weight:  50 kg  BMI:  Body mass index is 23.06 kg/m.  Estimated Nutritional Needs:  Kcal:  1600-1800 Protein:  80-100 grams Fluid:  >/= 1.6 L   Kirby Crigler RD, LDN Clinical Dietitian See [facility] for contact information.

## 2023-04-12 NOTE — Progress Notes (Signed)
Pharmacy Antibiotic Note  Madeline [name] is a 89 y.o. female admitted on 04/11/2023 with hypoglycemia, now w/ persistent fevers and soft BP.  Pharmacy has been consulted for vancomycin dosing.  Plan: Vancomycin 1250mg  x1 then 750mg  IV Q24H. Goal AUC 400-550.  Expected AUC 520.  Weight: 57.2 kg (126 lb 1.7 oz)  Temp (24hrs), Avg:100.3 F (37.9 C), Min:98.9 F (37.2 C), Max:100.8 F (38.2 C)  Recent Labs  Lab 04/11/23 1242 04/11/23 1258 04/11/23 1705  WBC 12.8*  --   --   CREATININE 0.86  --   --   LATICACIDVEN  --  2.8* 1.6    Estimated Creatinine Clearance: 37.1 mL/min (by C-G formula based on SCr of 0.86 mg/dL).    Allergies  Allergen Reactions   Darvon     "FEEL SICK"   Propoxyphene Nausea Only and Nausea And Vomiting   Sulfa Antibiotics Nausea Only and Nausea And Vomiting    "FEEL SICK"     Thank you for allowing pharmacy to be a part of this patient's care.  Vernard Gambles, PharmD, BCPS  04/12/2023 3:52 AM

## 2023-04-12 NOTE — Progress Notes (Signed)
Echocardiogram 2D Echocardiogram has been performed.  Madeline [name] 04/12/2023, 8:40 AM

## 2023-04-12 NOTE — Progress Notes (Signed)
NAME:  Madeline [name], MRN:  [id number], DOB:  Sep 16, 1934, LOS: 1 ADMISSION DATE:  04/11/2023, CONSULTATION DATE:  04/11/2023 REFERRING MD:  Gloris Manchester MD CHIEF COMPLAINT:  Unresponsiveness and hypoglycemia   History of Present Illness:  Patient is a 89 yr old female with significant past medical history of DM2, CAD, Dyslipidemia, GERD, hypothyroidism, and has a dual cardiac pacemaker, who was brought in by EMS due to severe hypoglycemia. Patient's blood glucose per CBG was 39 and EMS turned off insulin pump then gave 1 amp of D50 in route. CBG rebounded to 144. Patient failed to regain consciousness after CBG corrected and had GCS of 6. Patient was admitted upon admission to ED. Patient was in her normal state of health per family last night that are currently at beside. LNK per family was approximately 2030 on 5/25. Per patient's son and daughter at beside patient has had issues with managing her blood sugar and has had episodes of hypoglycemia. Patient's husband found her unresponsive sometime before 1100 this morning before EMS was called. PCCM was consulted to manage patient.   Pertinent  Medical History   Past Medical History:  Diagnosis Date   Bradycardia    Coronary artery disease    Diabetes mellitus    Dyslipidemia    GERD (gastroesophageal reflux disease)    Hypothyroid    Presence of permanent cardiac pacemaker 12/16/2018   Medtronic dual lead pacemaker     Significant Hospital Events: Including procedures, antibiotic start and stop dates in addition to other pertinent events   5/26 Admitted for unresponsiveness due to severe hypoglycemia, intubated for airway protection  5/27  Interim History / Subjective:  Remains intubated Minimally responsive Tmax 100  Objective   Blood pressure (!) 146/44, pulse 88, temperature 99.5 F (37.5 C), resp. rate 15, weight 57.2 kg, SpO2 100 %.    Vent Mode: PSV;CPAP FiO2 (%):  [40 %-100 %] 70 % Set Rate:  [20 bmp-22 bmp] 20 bmp Vt  Set:  [400 mL] 400 mL PEEP:  [5 cmH20] 5 cmH20 Pressure Support:  [10 cmH20] 10 cmH20 Plateau Pressure:  [16 cmH20-18 cmH20] 17 cmH20   Intake/Output Summary (Last 24 hours) at 04/12/2023 1037 Last data filed at 04/12/2023 0900 Gross per 24 hour  Intake 3207.92 ml  Output 1255 ml  Net 1952.92 ml   Filed Weights   04/11/23 1630 04/12/23 0331  Weight: 57.3 kg 57.2 kg    Examination: General: Chronically ill-appearing, on vent HENT: Moist oral mucosa, endotracheal tube in place Cardio: S1-S2 appreciated Pulm: Fair air entry bilaterally Abd: Bowel sounds appreciated Skin: warm, dry, no wounds or lesions Extremities: no edema, no clubbing Neuro: RASS 0,-1, she is arousable, following simple commands  Chest x-ray with right upper lobe interstitial prominence  Resolved Hospital Problem list   N/a   Assessment & Plan:   Metabolic encephalopathy Hypoglycemia -Neuroprotective measures -Seizure precautions -Aspiration precautions  Hyperglycemic crisis -Type 2 diabetes with a pump and place -Continue moderate SSI -Continue monitoring CBG -Monitor for hypoglycemia  Aspiration pneumonia Possible community-acquired pneumonia -She was intubated for airway protection -Will wean as tolerated -Continue pulmonary hygiene -Wean sedation -Ceftriaxone, add azithromycin  Heart failure with preserved ejection fraction Elevated BNP -Monitor  History of GERD -Continue Protonix Resume atorvastatin for dyslipidemia  Discussed with family members at bedside  Appears to be weaning well  Best Practice (right click and "Reselect all SmartList Selections" daily)   Diet/type: NPO w/ oral meds DVT prophylaxis: prophylactic heparin  GI  prophylaxis: PPI Lines: N/A Foley:  N/A Code Status:  full code Last date of multidisciplinary goals of care discussion [ Family updated at beside ]  Labs   CBC: Recent Labs  Lab 04/11/23 1242 04/11/23 1310 04/12/23 0434  WBC 12.8*  --  10.5   NEUTROABS 10.3*  --  7.9*  HGB 11.8* 11.6* 10.9*  HCT 36.6 34.0* 34.1*  MCV 90.8  --  92.4  PLT 295  --  232    Basic Metabolic Panel: Recent Labs  Lab 04/11/23 1242 04/11/23 1310 04/11/23 1705 04/12/23 0434  NA 138 138  --  132*  K 3.4* 3.3* 5.3* 4.7  CL 101  --   --  99  CO2 26  --   --  25  GLUCOSE 137*  --   --  178*  BUN 17  --   --  26*  CREATININE 0.86  --   --  1.09*  CALCIUM 8.7*  --   --  8.0*  MG 2.8*  --  2.3 2.4  PHOS  --   --  4.6 4.7*   GFR: Estimated Creatinine Clearance: 29.3 mL/min (A) (by C-G formula based on SCr of 1.09 mg/dL (H)). Recent Labs  Lab 04/11/23 1242 04/11/23 1258 04/11/23 1705 04/12/23 0434  PROCALCITON  --   --  <0.10  --   WBC 12.8*  --   --  10.5  LATICACIDVEN  --  2.8* 1.6  --     Liver Function Tests: Recent Labs  Lab 04/11/23 1242  AST 31  ALT 30  ALKPHOS 88  BILITOT 0.7  PROT 6.4*  ALBUMIN 3.5   No results for input(s): "LIPASE", "AMYLASE" in the last 168 hours. Recent Labs  Lab 04/11/23 1242  AMMONIA 14    ABG    Component Value Date/Time   PHART 7.350 04/11/2023 1310   PCO2ART 52.6 (H) 04/11/2023 1310   PO2ART 564 (H) 04/11/2023 1310   HCO3 29.0 (H) 04/11/2023 1310   TCO2 31 04/11/2023 1310   ACIDBASEDEF 4.1 (H) 10/04/2022 1218   O2SAT 100 04/11/2023 1310     Coagulation Profile: Recent Labs  Lab 04/11/23 1258  INR 1.0    Cardiac Enzymes: No results for input(s): "CKTOTAL", "CKMB", "CKMBINDEX", "TROPONINI" in the last 168 hours.  HbA1C: Hemoglobin A1C  Date/Time Value Ref Range Status  03/24/2019 12:00 AM 7.9  Final   Hgb A1c MFr Bld  Date/Time Value Ref Range Status  12/15/2021 05:50 PM 8.2 (H) 4.8 - 5.6 % Final    Comment:    (NOTE)         Prediabetes: 5.7 - 6.4         Diabetes: >6.4         Glycemic control for adults with diabetes: <7.0   08/07/2019 08:57 AM 8.0 (H) 4.8 - 5.6 % Final    Comment:             Prediabetes: 5.7 - 6.4          Diabetes: >6.4          Glycemic  control for adults with diabetes: <7.0     CBG: Recent Labs  Lab 04/12/23 0503 04/12/23 0522 04/12/23 0620 04/12/23 0748 04/12/23 0850  GLUCAP 191* 214* 180* 170* 157*    Review of Systems:   Patient intubated, sedated, UTA  Past Medical History:  She,  has a past medical history of Bradycardia, Coronary artery disease, Diabetes mellitus, Dyslipidemia, GERD (gastroesophageal reflux  disease), Hypothyroid, and Presence of permanent cardiac pacemaker (12/16/2018).   Surgical History:   Past Surgical History:  Procedure Laterality Date   APPENDECTOMY     BREAST BIOPSY Right 1967   EXCISIONAL - NEG   CHOLECYSTECTOMY     CORONARY STENT INTERVENTION     INSERT / REPLACE / REMOVE PACEMAKER  12/16/2018   no surgical hx     PACEMAKER IMPLANT N/A 12/16/2018   Procedure: PACEMAKER IMPLANT;  Surgeon: Marinus Maw, MD;  Location: [facility];  Service: Cardiovascular;  Laterality: N/A;     Social History:   reports that she has been smoking cigarettes. She has a 50.00 pack-year smoking history. She has never used smokeless tobacco. She reports that she does not drink alcohol and does not use drugs.   Family History:  Her family history includes Breast cancer (age of onset: 15) in her sister; Breast cancer (age of onset: 58) in her sister; Cancer in her sister, sister, and sister; Diabetes in her son; Heart Problems in her mother; Other in her father.   Allergies Allergies  Allergen Reactions   Darvon     "FEEL SICK"   Propoxyphene Nausea Only and Nausea And Vomiting   Sulfa Antibiotics Nausea Only and Nausea And Vomiting    "FEEL SICK"     The patient is critically ill with multiple organ systems failure and requires high complexity decision making for assessment and support, frequent evaluation and titration of therapies, application of advanced monitoring technologies and extensive interpretation of multiple databases. Critical Care Time devoted to patient care  services described in this note independent of APP/resident time (if applicable)  is 40 minutes.   Virl Diamond MD [facility] Personal pager: See Amion If unanswered, please page CCM On-call: [phone]

## 2023-04-12 NOTE — Procedures (Signed)
Extubation Procedure Note  Patient Details:   Name: Madeline [name] DOB: [birth year] MRN: [id number]   Airway Documentation:    Vent end date: 04/12/23 Vent end time: 1355   Evaluation  O2 sats: stable throughout Complications: No apparent complications Patient did tolerate procedure well. Bilateral Breath Sounds: Diminished   Yes  Pt extubated to 2L [place]. Cuff leak present, no stridor noted, pt tolerated well. RN at bedside,MD aware, RT will monitor as needed.   Thornell Mule 04/12/2023, 3:40 PM

## 2023-04-12 NOTE — Progress Notes (Signed)
eLink Physician-Brief Progress Note Patient Name: Madeline [name] DOB: 1934-08-05 MRN: [id number]   Date of Service  04/12/2023  HPI/Events of Note  Bedside RN requesting transition from IV insulin to subcutaneous.  eICU Interventions  Patient presented with hypoglycemia.  Insulin drip started for hyperglycemia.  She was intubated for altered mental status.  I suspect there will be an attempt to extubate her this morning.  She has had multiple prior episodes of hypoglycemia from subcu insulin.  Given his subcu insulin at this time when she could be made n.p.o. for extubation will only make her prone to another episode of hypoglycemia.  Will leave IV insulin on which can be quickly turned on or off based on her blood glucose level until she is extubated and taking adequate p.o.  Communicated to [employer]     Intervention Category Evaluation Type: Other  Carilyn Goodpasture 04/12/2023, 6:46 AM

## 2023-04-12 NOTE — Progress Notes (Signed)
Patient was seen postextubation  Tolerated extubation well  Stable hemodynamics  Appreciate diabetes coordinator input  Will continue to monitor closely postextubation, unlikely to be able to tolerate anything orally at present

## 2023-04-12 NOTE — Progress Notes (Signed)
eLink Physician-Brief Progress Note Patient Name: Madeline [name] DOB: Apr 10, 1934 MRN: [id number]   Date of Service  04/12/2023  HPI/Events of Note  Patient continues with low-grade fevers overnight.  Blood pressure borderline soft.  Clinically though she is unchanged.  eICU Interventions  Will order repeat blood cultures.  Order repeat chest x-ray. Give some volume resuscitation. Add vancomycin to regimen to cover for resistant gram-positive's.  Further care depending on patient's clinical course.        Carilyn Goodpasture 04/12/2023, 2:56 AM

## 2023-04-12 NOTE — Inpatient Diabetes Management (Signed)
Inpatient Diabetes Program Recommendations  AACE/ADA: New Consensus Statement on Inpatient Glycemic Control (2015)  Target Ranges:  Prepandial:   less than 140 mg/dL      Peak postprandial:   less than 180 mg/dL (1-2 hours)      Critically ill patients:  140 - 180 mg/dL   Lab Results  Component Value Date   GLUCAP 215 (H) 04/12/2023   HGBA1C 8.2 (H) 12/15/2021    Review of Glycemic Control  Diabetes history: type 1 Outpatient Diabetes medications: Medtronic Minimed Paradigm Revel 723 insulin pump.  Current orders for Inpatient glycemic control: IV insulin drip  Inpatient Diabetes Program Recommendations:   Patient admitted with hypoglycemia. Patient has a Medtronic [employer] 723 insulin pump. She has a [employer] and a [employer] 2 CGM. Sees Dr. Tedd Sias, endocrinologist.  Current pump settings: (as of April, 2024) Basal settings: 12 AM 0.45 units/hr 6 am 0.925 units/hr 8 am 0.6 units/hr 5 pm 0.7 units/hr 24-hr basal = 14.85 units NovoLog   Bolus settings I:C 12AM 15, 11AM 12 Sensitivity 65 Target 100-120 Active insulin time 5 hrs   Patient is currently intubated and on IV insulin drip. When patient is ready to transition off IV insulin, recommend Semglee 12 units daily, Novolog 0-9 units correction scale every 4 hours. May need some tube feed coverage if blood sugars continue to be elevated. Titrate dosages as needed.  Will continue to monitor blood sugars while in the hospital.  Smith Mince RN BSN CDE Diabetes Coordinator Pager: [phone]  8am-5pm

## 2023-04-13 ENCOUNTER — Encounter (HOSPITAL_COMMUNITY): Payer: Self-pay | Admitting: Internal Medicine

## 2023-04-13 DIAGNOSIS — G9341 Metabolic encephalopathy: Secondary | ICD-10-CM | POA: Diagnosis not present

## 2023-04-13 DIAGNOSIS — E162 Hypoglycemia, unspecified: Secondary | ICD-10-CM | POA: Diagnosis not present

## 2023-04-13 LAB — CULTURE, BLOOD (ROUTINE X 2)

## 2023-04-13 LAB — BASIC METABOLIC PANEL
Anion gap: 12 (ref 5–15)
BUN: 11 mg/dL (ref 8–23)
CO2: 26 mmol/L (ref 22–32)
Calcium: 7.9 mg/dL — ABNORMAL LOW (ref 8.9–10.3)
Chloride: 100 mmol/L (ref 98–111)
Creatinine, Ser: 0.61 mg/dL (ref 0.44–1.00)
GFR, Estimated: >60 mL/min (ref >60)
Glucose, Bld: 197 mg/dL — ABNORMAL HIGH (ref 70–99)
Potassium: 3.9 mmol/L (ref 3.5–5.1)
Sodium: 138 mmol/L (ref 135–145)

## 2023-04-13 LAB — CBC
HCT: 31.2 % — ABNORMAL LOW (ref 36.0–46.0)
Hemoglobin: 10.1 g/dL — ABNORMAL LOW (ref 12.0–15.0)
MCH: 29.7 pg (ref 26.0–34.0)
MCHC: 32.4 g/dL (ref 30.0–36.0)
MCV: 91.8 fL (ref 80.0–100.0)
Platelets: 227 10*3/uL (ref 150–400)
RBC: 3.4 MIL/uL — ABNORMAL LOW (ref 3.87–5.11)
RDW: 13.8 % (ref 11.5–15.5)
WBC: 9 10*3/uL (ref 4.0–10.5)
nRBC: 0 % (ref 0.0–0.2)

## 2023-04-13 LAB — GLUCOSE, CAPILLARY
Glucose-Capillary: 127 mg/dL — ABNORMAL HIGH (ref 70–99)
Glucose-Capillary: 175 mg/dL — ABNORMAL HIGH (ref 70–99)
Glucose-Capillary: 188 mg/dL — ABNORMAL HIGH (ref 70–99)
Glucose-Capillary: 196 mg/dL — ABNORMAL HIGH (ref 70–99)
Glucose-Capillary: 224 mg/dL — ABNORMAL HIGH (ref 70–99)
Glucose-Capillary: 355 mg/dL — ABNORMAL HIGH (ref 70–99)
Glucose-Capillary: 378 mg/dL — ABNORMAL HIGH (ref 70–99)
Glucose-Capillary: 467 mg/dL — ABNORMAL HIGH (ref 70–99)
Glucose-Capillary: 488 mg/dL — ABNORMAL HIGH (ref 70–99)
Glucose-Capillary: 493 mg/dL — ABNORMAL HIGH (ref 70–99)

## 2023-04-13 LAB — MAGNESIUM: Magnesium: 2 mg/dL (ref 1.7–2.4)

## 2023-04-13 LAB — PHOSPHORUS: Phosphorus: 2.6 mg/dL (ref 2.5–4.6)

## 2023-04-13 LAB — HEMOGLOBIN A1C
Hgb A1c MFr Bld: 9.2 % — ABNORMAL HIGH (ref 4.8–5.6)
Mean Plasma Glucose: 217 mg/dL

## 2023-04-13 MED ORDER — DEXTROSE 50 % IV SOLN: 0.0000 mL | INTRAVENOUS | Status: DC | PRN

## 2023-04-13 MED ORDER — ENSURE ENLIVE PO LIQD
237.0000 mL | Freq: Three times a day (TID) | ORAL | Status: DC
  Administered 2023-04-13 – 2023-04-15 (×5): 237 mL via ORAL

## 2023-04-13 MED ORDER — INSULIN REGULAR(HUMAN) IN NACL 100-0.9 UT/100ML-% IV SOLN
INTRAVENOUS | Status: DC
  Administered 2023-04-13: 8 [IU]/h via INTRAVENOUS
  Filled 2023-04-13 (×3): qty 100

## 2023-04-13 MED ORDER — POLYETHYLENE GLYCOL 3350 17 G PO PACK: 17.0000 g | PACK | Freq: Every day | ORAL | Status: DC | PRN

## 2023-04-13 MED ORDER — DOCUSATE SODIUM 100 MG PO CAPS: 100.0000 mg | ORAL_CAPSULE | Freq: Two times a day (BID) | ORAL | Status: DC | PRN

## 2023-04-13 MED ORDER — LABETALOL HCL 5 MG/ML IV SOLN
5.0000 mg | Freq: Four times a day (QID) | INTRAVENOUS | Status: DC | PRN
  Filled 2023-04-13: qty 4

## 2023-04-13 MED ORDER — ADULT MULTIVITAMIN W/MINERALS CH
1.0000 | ORAL_TABLET | Freq: Every day | ORAL | Status: DC
  Administered 2023-04-13 – 2023-04-19 (×7): 1 via ORAL
  Filled 2023-04-13 (×7): qty 1

## 2023-04-13 MED ORDER — ONDANSETRON HCL 4 MG/2ML IJ SOLN
4.0000 mg | Freq: Four times a day (QID) | INTRAMUSCULAR | Status: DC | PRN
  Administered 2023-04-13 (×2): 4 mg via INTRAVENOUS
  Filled 2023-04-13 (×2): qty 2

## 2023-04-13 MED ORDER — INSULIN ASPART 100 UNIT/ML IJ SOLN
0.0000 [IU] | Freq: Three times a day (TID) | INTRAMUSCULAR | Status: DC
  Administered 2023-04-13: 9 [IU] via SUBCUTANEOUS
  Administered 2023-04-13: 2 [IU] via SUBCUTANEOUS

## 2023-04-13 NOTE — Progress Notes (Signed)
eLink Physician-Brief Progress Note Patient Name: CHARLYNNE TUBBS DOB: [birth year] MRN: [id number]   Date of Service  04/13/2023  HPI/Events of Note  Patient with worsening hyperglycemia.  Latest blood glucose level was 378.  eICU Interventions  Discontinue subcutaneous insulin and start patient on insulin drip.     Intervention Category Major Interventions: Hyperglycemia - active titration of insulin therapy  Carilyn Goodpasture 04/13/2023, 9:07 PM

## 2023-04-13 NOTE — Progress Notes (Signed)
NAME:  Madeline [name], MRN:  [id number], DOB:  27-Jun-1934, LOS: 2 ADMISSION DATE:  04/11/2023, CONSULTATION DATE:  04/11/2023 REFERRING MD:  Gloris Manchester MD CHIEF COMPLAINT:  Unresponsiveness and hypoglycemia   History of Present Illness:  Patient is a 89 yr old female with significant past medical history of DM2, CAD, Dyslipidemia, GERD, hypothyroidism, and has a dual cardiac pacemaker, who was brought in by EMS due to severe hypoglycemia. Patient's blood glucose per CBG was 39 and EMS turned off insulin pump then gave 1 amp of D50 in route. CBG rebounded to 144. Patient failed to regain consciousness after CBG corrected and had GCS of 6. Patient was admitted upon admission to ED. Patient was in her normal state of health per family last night that are currently at beside. LNK per family was approximately 2030 on 5/25. Per patient's son and daughter at beside patient has had issues with managing her blood sugar and has had episodes of hypoglycemia. Patient's husband found her unresponsive sometime before 1100 this morning before EMS was called. PCCM was consulted to manage patient.   Pertinent  Medical History   Past Medical History:  Diagnosis Date   Bradycardia    Coronary artery disease    Diabetes mellitus    Dyslipidemia    GERD (gastroesophageal reflux disease)    Hypothyroid    Presence of permanent cardiac pacemaker 12/16/2018   Medtronic dual lead pacemaker     Significant Hospital Events: Including procedures, antibiotic start and stop dates in addition to other pertinent events   5/26 Admitted for unresponsiveness due to severe hypoglycemia, intubated for airway protection  5/27  Interim History / Subjective:  Patient remained afebrile Was successfully extubated yesterday, currently on room air Mental status has improved  Objective   Blood pressure (!) 171/72, pulse 84, temperature 99.1 F (37.3 C), resp. rate 18, weight 55.8 kg, SpO2 98 %.    Vent Mode: PSV;CPAP FiO2  (%):  [40 %] 40 % PEEP:  [5 cmH20] 5 cmH20 Pressure Support:  [8 cmH20] 8 cmH20   Intake/Output Summary (Last 24 hours) at 04/13/2023 1126 Last data filed at 04/13/2023 0700 Gross per 24 hour  Intake 423.1 ml  Output 1684 ml  Net -1260.9 ml   Filed Weights   04/11/23 1630 04/12/23 0331 04/13/23 0957  Weight: 57.3 kg 57.2 kg 55.8 kg    Examination: Physical exam: General: Chronically ill-appearing female, lying on the bed HEENT: [place]/AT, eyes anicteric.  moist mucus membranes Neuro: Awake, following commands, confused intermittently Chest: Coarse breath sounds, no wheezes or rhonchi Heart: Regular rate and rhythm, strong polyp murmur best heard in mitral area or gallops Abdomen: Soft, nontender, nondistended, bowel sounds present Skin: No rash  Labs and images were reviewed  Resolved Hospital Problem list   N/a   Assessment & Plan:  Acute metabolic encephalopathy in the setting of recurrent hypoglycemia Recurrent hypoglycemia Patient mental status has improved Avoid sedation Blood sugar has stabilized  Poorly controlled diabetes with hypo and hyperglycemia Patient is on insulin pump at home Patient hemoglobin A1c is 9.2 Continue Lantus 5 units daily Continue sliding scale insulin CBG goal 140-180  Community-acquired pneumonia versus aspiration pneumonia Patient became afebrile now Continue IV antibiotic with ceftriaxone and azithromycin  CHF was ruled out with normal echocardiogram  GERD Continue Protonix  Hyperlipidemia Continue atorvastatin  Tobacco dependence Continue nicotine patch to prevent nicotine withdrawal Smoking cessation counseling provided at bedside  Acute kidney injury Hyponatremia Hypokalemia Serum creatinine and electrolytes are  within normal limits now  Best Practice (right click and "Reselect all SmartList Selections" daily)   Diet/type: Dysphagia diet DVT prophylaxis: prophylactic heparin  GI prophylaxis: PPI Lines: N/A Foley:   N/A Code Status:  full code Last date of multidisciplinary goals of care discussion [5/28: Family updated at beside ]  Labs   CBC: Recent Labs  Lab 04/11/23 1242 04/11/23 1310 04/12/23 0434 04/13/23 0613  WBC 12.8*  --  10.5 9.0  NEUTROABS 10.3*  --  7.9*  --   HGB 11.8* 11.6* 10.9* 10.1*  HCT 36.6 34.0* 34.1* 31.2*  MCV 90.8  --  92.4 91.8  PLT 295  --  232 227    Basic Metabolic Panel: Recent Labs  Lab 04/11/23 1242 04/11/23 1310 04/11/23 1705 04/12/23 0434 04/12/23 1718 04/13/23 0613  NA 138 138  --  132*  --  138  K 3.4* 3.3* 5.3* 4.7  --  3.9  CL 101  --   --  99  --  100  CO2 26  --   --  25  --  26  GLUCOSE 137*  --   --  178*  --  197*  BUN 17  --   --  26*  --  11  CREATININE 0.86  --   --  1.09*  --  0.61  CALCIUM 8.7*  --   --  8.0*  --  7.9*  MG 2.8*  --  2.3 2.4 2.3 2.0  PHOS  --   --  4.6 4.7* 3.5 2.6   GFR: Estimated Creatinine Clearance: 39.9 mL/min (by C-G formula based on SCr of 0.61 mg/dL). Recent Labs  Lab 04/11/23 1242 04/11/23 1258 04/11/23 1705 04/12/23 0434 04/13/23 0613  PROCALCITON  --   --  <0.10  --   --   WBC 12.8*  --   --  10.5 9.0  LATICACIDVEN  --  2.8* 1.6  --   --     Liver Function Tests: Recent Labs  Lab 04/11/23 1242  AST 31  ALT 30  ALKPHOS 88  BILITOT 0.7  PROT 6.4*  ALBUMIN 3.5   No results for input(s): "LIPASE", "AMYLASE" in the last 168 hours. Recent Labs  Lab 04/11/23 1242  AMMONIA 14    ABG    Component Value Date/Time   PHART 7.350 04/11/2023 1310   PCO2ART 52.6 (H) 04/11/2023 1310   PO2ART 564 (H) 04/11/2023 1310   HCO3 29.0 (H) 04/11/2023 1310   TCO2 31 04/11/2023 1310   ACIDBASEDEF 4.1 (H) 10/04/2022 1218   O2SAT 100 04/11/2023 1310     Coagulation Profile: Recent Labs  Lab 04/11/23 1258  INR 1.0    Cardiac Enzymes: No results for input(s): "CKTOTAL", "CKMB", "CKMBINDEX", "TROPONINI" in the last 168 hours.  HbA1C: Hemoglobin A1C  Date/Time Value Ref Range Status   03/24/2019 12:00 AM 7.9  Final   Hgb A1c MFr Bld  Date/Time Value Ref Range Status  04/11/2023 05:05 PM 9.2 (H) 4.8 - 5.6 % Final    Comment:    (NOTE)         Prediabetes: 5.7 - 6.4         Diabetes: >6.4         Glycemic control for adults with diabetes: <7.0   12/15/2021 05:50 PM 8.2 (H) 4.8 - 5.6 % Final    Comment:    (NOTE)         Prediabetes: 5.7 - 6.4  Diabetes: >6.4         Glycemic control for adults with diabetes: <7.0     CBG: Recent Labs  Lab 04/12/23 1640 04/12/23 1913 04/12/23 2325 04/13/23 0329 04/13/23 0729  GLUCAP 150* 125* 230* 196* 175*     Cheri Fowler, MD [facility] See Amion for pager If no response to pager, please call [phone] until 7pm After 7pm, Please call E-link [phone]

## 2023-04-13 NOTE — Discharge Instructions (Signed)
Carbohydrate Counting For People With Diabetes  Foods with carbohydrates make your blood glucose level go up. Learning how to count carbohydrates can help you control your blood glucose levels. First, identify the foods you eat that contain carbohydrates. Then, using the Foods with Carbohydrates chart, determine about how much carbohydrates are in your meals and snacks. Make sure you are eating foods with fiber, protein, and healthy fat along with your carbohydrate foods.  Foods with Carbohydrates The following table shows carbohydrate foods that have about 15 grams of carbohydrate each. Using measuring cups, spoons, or a food scale when you first begin learning about carbohydrate counting can help you learn about the portion sizes you typically eat. The following foods have 15 grams carbohydrate each:   Grains 1 slice bread (1 ounce)  1 small tortilla (6-inch size)   large bagel (1 ounce)  1/3 cup pasta or rice (cooked)   hamburger or hot dog bun ( ounce)   cup cooked cereal   to  cup ready-to-eat cereal  2 taco shells (5-inch size) Fruit 1 small fresh fruit ( to 1 cup)   medium banana  17 small grapes (3 ounces)  1 cup melon or berries   cup canned or frozen fruit  2 tablespoons dried fruit (blueberries, cherries, cranberries, raisins)   cup unsweetened fruit juice  Starchy Vegetables  cup cooked beans, peas, corn, potatoes/sweet potatoes   large baked potato (3 ounces)  1 cup acorn or butternut squash  Snack Foods 3 to 6 crackers  8 potato chips or 13 tortilla chips ( ounce to 1 ounce)  3 cups popped popcorn  Dairy 3/4 cup (6 ounces) nonfat plain yogurt, or yogurt with sugar-free sweetener  1 cup milk  1 cup plain rice, soy, coconut or flavored almond milk Sweets and Desserts  cup ice cream or frozen yogurt  1 tablespoon jam, jelly, pancake syrup, table sugar, or honey  2 tablespoons light pancake syrup  1 inch square of frosted cake or 2 inch square of  unfrosted cake  2 small cookies (2/3 ounce each) or  large cookie   Sometimes you'll have to estimate carbohydrate amounts if you don't know the exact recipe. One cup of mixed foods like soups can have 1 to 2 carbohydrate servings, while some casseroles might have 2 or more servings of carbohydrate. Foods that have less than 20 calories in each serving can be counted as "free" foods. Count 1 cup raw vegetables, or  cup cooked non-starchy vegetables as "free" foods. If you eat 3 or more servings at one meal, then count them as 1 carbohydrate serving.   Foods without Carbohydrates  Not all foods contain carbohydrates. Meat, some dairy, fats, non-starchy vegetables, and many beverages don't contain carbohydrate. So when you count carbohydrates, you can generally exclude chicken, pork, beef, fish, seafood, eggs, tofu, cheese, butter, sour cream, avocado, nuts, seeds, olives, mayonnaise, water, black coffee, unsweetened tea, and zero-calorie drinks. Vegetables with no or low carbohydrate include green beans, cauliflower, tomatoes, and onions.  How much carbohydrate should I eat at each meal?  Carbohydrate counting can help you plan your meals and manage your weight. Following are some starting points for carbohydrate intake at each meal. Work with your registered dietitian nutritionist to find the best range that works for your blood glucose and weight.   To Lose Weight To Maintain Weight  Women 2 - 3 carb servings 3 - 4 carb servings  Men 3 - 4 carb servings 4 - 5  carb servings  Checking your blood glucose after meals will help you know if you need to adjust the timing, type, or number of carbohydrate servings in your meal plan. Achieve and keep a healthy body weight by balancing your food intake and physical activity.  Tips  How should I plan my meals?  Plan for half the food on your plate to include non-starchy vegetables, like salad greens, broccoli, or carrots. Try to eat 3 to 5 servings of  non-starchy vegetables every day. Have a protein food at each meal. Protein foods include chicken, fish, meat, eggs, or beans (note that beans contain carbohydrate). These two food groups (non-starchy vegetables and proteins) are low in carbohydrate. If you fill up your plate with these foods, you will eat less carbohydrate but still fill up your stomach. Try to limit your carbohydrate portion to  of the plate.   What fats are healthiest to eat?  Diabetes increases risk for heart disease. To help protect your heart, eat more healthy fats, such as olive oil, nuts, and avocado. Eat less saturated fats like butter, cream, and high-fat meats, like bacon and sausage. Avoid trans fats, which are in all foods that list "partially hydrogenated oil" as an ingredient. What should I drink?  Choose drinks that are not sweetened with sugar. The healthiest choices are water, carbonated or seltzer waters, and tea and coffee without added sugars.  Sweet drinks will make your blood glucose go up very quickly. One serving of soda or energy drink is  cup. It is best to drink these beverages only if your blood glucose is low.  Artificially sweetened, or diet drinks, typically do not increase your blood glucose if they have zero calories in them. Read labels of beverages, as some diet drinks do have carbohydrate and will raise your blood glucose.  Label Reading Tips Read Nutrition Facts labels to find out how many grams of carbohydrate are in a food you want to eat. Don't forget: sometimes serving sizes on the label aren't the same as how much food you are going to eat, so you may need to calculate how much carbohydrate is in the food you are serving yourself.   Carbohydrate Counting for People with Diabetes Sample 1-Day Menu  Breakfast  cup yogurt, low fat, low sugar (1 carbohydrate serving)   cup cereal, ready-to-eat, unsweetened (1 carbohydrate serving)  1 cup strawberries (1 carbohydrate serving)   cup almonds  ( carbohydrate serving)  Lunch 1, 5 ounce can chunk light tuna  2 ounces cheese, low fat cheddar  6 whole wheat crackers (1 carbohydrate serving)  1 small apple (1 carbohydrate servings)   cup carrots ( carbohydrate serving)   cup snap peas  1 cup 1% milk (1 carbohydrate serving)   Evening Meal Stir fry made with: 3 ounces chicken  1 cup brown rice (3 carbohydrate servings)   cup broccoli ( carbohydrate serving)   cup green beans   cup onions  1 tablespoon olive oil  2 tablespoons teriyaki sauce ( carbohydrate serving)  Evening Snack 1 extra small banana (1 carbohydrate serving)  1 tablespoon peanut butter   Carbohydrate Counting for People with Diabetes Vegan Sample 1-Day Menu  Breakfast 1 cup cooked oatmeal (2 carbohydrate servings)   cup blueberries (1 carbohydrate serving)  2 tablespoons flaxseeds  1 cup soymilk fortified with calcium and vitamin D  1 cup coffee  Lunch 2 slices whole wheat bread (2 carbohydrate servings)   cup baked tofu   cup lettuce  2 slices tomato  2 slices avocado   cup baby carrots ( carbohydrate serving)  1 orange (1 carbohydrate serving)  1 cup soymilk fortified with calcium and vitamin D   Evening Meal Burrito made with: 1 6-inch corn tortilla (1 carbohydrate serving)  1 cup refried vegetarian beans (2 carbohydrate servings)   cup chopped tomatoes   cup lettuce   cup salsa  1/3 cup brown rice (1 carbohydrate serving)  1 tablespoon olive oil for rice   cup zucchini   Evening Snack 6 small whole grain crackers (1 carbohydrate serving)  2 apricots ( carbohydrate serving)   cup unsalted peanuts ( carbohydrate serving)    Using Nutrition Labels: Carbohydrate  Serving Size  Look at the serving size. All the information on the label is based on this portion. Servings Per Container  The number of servings contained in the package. Guidelines for Carbohydrate  Look at the total grams of carbohydrate in the serving size.  1  carbohydrate choice = 15 grams of carbohydrate. Range of Carbohydrate Grams Per Choice  Carbohydrate Grams/Choice Carbohydrate Choices  6-10   11-20 1  21-25 1  26-35 2  36-40 2  41-50 3  51-55 3  56-65 4  66-70 4  71-80 5    Copyright 2020  [employer]y [employer]. All rights reserved.

## 2023-04-13 NOTE — Progress Notes (Signed)
Patient's BG read 378mg /dL. Provider was informed and an order for Insulin infusion was received, however this floor is inappropriate for insulin infusions so transfer order was requested to send the patient to an appropriate unit. At 2[address] was called to give report to the receiving RN and patient was accordingly transferred to the unit. Patient was alert but confused with GCS 14/15.

## 2023-04-13 NOTE — Progress Notes (Signed)
Nutrition Follow-up  DOCUMENTATION CODES:  Not applicable  INTERVENTION:  Continue current diet as ordered Ordering assistance MVI with minerals daily Ensure Enlive po TID, each supplement provides 350 kcal and 20 grams of protein Magic cup TID with meals, each supplement provides 290 kcal and 9 grams of protein Attached DM diet education to AVS and referral to outpatient DM education  NUTRITION DIAGNOSIS:  Inadequate oral intake related to inability to eat as evidenced by NPO status. - remains applicable  GOAL:  Patient will meet greater than or equal to 90% of their needs - progressing, diet in place and adding supplements  MONITOR:  PO intake, Supplement acceptance, Labs, Weight trends, I & O's  REASON FOR ASSESSMENT:  Consult Enteral/tube feeding initiation and management  ASSESSMENT:  89 y.o. female presented to the ED with hypoglycemia. PMH includes GERD, CAD, COPD, CHF, malnutrition, and DM. Pt admitted with toxic metabolic encephalopathy 2/2 hypoglycemia and aspiration PNA.   5/26 - Admitted; intubated  5/27 - extubated  Pt sleeping at the time of assessment, daughter present at bedside provides some hx. Pt extubated yesterday and able to have diet advanced this AM after passing bedside swallow   Daughter reiterates that pt has poor appetite at baseline. Snacks on chips, cookies, and candy frequently throughout the day and has a hotdog every day for lunch and a small item like a sandwich for dinner. Pt does not cook at home. Also reports that she has extreme drops and rises in glucose levels. Monitors very frequently at home. Will refer to outpatient DM education as pt needs to establish a more predictable eating pattern to achieve better control.    Intake/Output Summary (Last 24 hours) at 04/13/2023 1337 Last data filed at 04/13/2023 1200 Gross per 24 hour  Intake 137.47 ml  Output 1885 ml  Net -1747.53 ml  Net IO Since Admission: 191.26 mL [04/13/23  1337]  Nutritionally Relevant Medications: Scheduled Meds:  atorvastatin  20 mg Oral Daily   insulin aspart  0-9 Units Subcutaneous TID WC   insulin glargine-yfgn  5 Units Subcutaneous Daily   Continuous Infusions:  azithromycin 250 mL/hr at 04/13/23 1200   cefTRIAXone (ROCEPHIN)  IV Stopped (04/13/23 0414)   PRN Meds: docusate sodium, polyethylene glycol  Labs Reviewed: CBG ranges from 125-230 mg/dL over the last 24 hours HgbA1c 9.2%  NUTRITION - FOCUSED PHYSICAL EXAM: Flowsheet Row Most Recent Value  Orbital Region Unable to assess  Upper Arm Region No depletion  Thoracic and Lumbar Region No depletion  Buccal Region Unable to assess  Temple Region Mild depletion  Clavicle Bone Region Mild depletion  Clavicle and Acromion Bone Region Mild depletion  Scapular Bone Region Mild depletion  Dorsal Hand Mild depletion  Patellar Region Mild depletion  Anterior Thigh Region Mild depletion  Posterior Calf Region Mild depletion  Edema (RD Assessment) None  Hair Reviewed  Eyes Unable to assess  Mouth Unable to assess  Skin Reviewed  Nails Reviewed   Diet Order:   Diet Order             DIET DYS 2 Room service appropriate? Yes; Fluid consistency: Thin  Diet effective now                  EDUCATION NEEDS: Education needs have been addressed  Skin:  Skin Assessment: Reviewed RN Assessment Skin tear: right elbow  Last BM:  5/28 - type 5  Height:  Ht Readings from Last 1 Encounters:  03/20/23 5\' 2"  (1.575  m)   Weight:  Wt Readings from Last 1 Encounters:  04/13/23 55.8 kg   Ideal Body Weight:  50 kg  BMI:  Body mass index is 22.5 kg/m.  Estimated Nutritional Needs:  Kcal:  1600-1800 Protein:  80-100 grams Fluid:  >/= 1.6 L   Greig Castilla, RD, LDN Clinical Dietitian RD pager # available in AMION  After hours/weekend pager # available in [facility]

## 2023-04-13 NOTE — Progress Notes (Signed)
RN received care and report and assumed care. Pt disoriented times 3. She cannot give her name or date of birth. Pt speech is mumbled. Pt follows directions. Skin assessment completed with Evangeline Gula. Pt has red labia redness on back, buttocks and skin tears noted to UE bilaterally. R elbow has stage 2 pressure injury. Wound covered with foam dressing, petroleum gauze. Pt pulling at IV. RUE IV wrapped in kerlex. Endo tool started with CBG of 488 at 2317. IV insulin started at 72ml/hr.   CBG 23:52 467

## 2023-04-14 ENCOUNTER — Other Ambulatory Visit (HOSPITAL_COMMUNITY): Payer: Self-pay

## 2023-04-14 DIAGNOSIS — E162 Hypoglycemia, unspecified: Secondary | ICD-10-CM | POA: Diagnosis not present

## 2023-04-14 LAB — BASIC METABOLIC PANEL
Anion gap: 11 (ref 5–15)
BUN: 14 mg/dL (ref 8–23)
CO2: 28 mmol/L (ref 22–32)
Calcium: 9.1 mg/dL (ref 8.9–10.3)
Chloride: 102 mmol/L (ref 98–111)
Creatinine, Ser: 0.79 mg/dL (ref 0.44–1.00)
GFR, Estimated: >60 mL/min (ref >60)
Glucose, Bld: 186 mg/dL — ABNORMAL HIGH (ref 70–99)
Potassium: 3.7 mmol/L (ref 3.5–5.1)
Sodium: 141 mmol/L (ref 135–145)

## 2023-04-14 LAB — CBC
HCT: 32.3 % — ABNORMAL LOW (ref 36.0–46.0)
Hemoglobin: 10.5 g/dL — ABNORMAL LOW (ref 12.0–15.0)
MCH: 29.1 pg (ref 26.0–34.0)
MCHC: 32.5 g/dL (ref 30.0–36.0)
MCV: 89.5 fL (ref 80.0–100.0)
Platelets: 242 10*3/uL (ref 150–400)
RBC: 3.61 MIL/uL — ABNORMAL LOW (ref 3.87–5.11)
RDW: 13.6 % (ref 11.5–15.5)
WBC: 8 10*3/uL (ref 4.0–10.5)
nRBC: 0 % (ref 0.0–0.2)

## 2023-04-14 LAB — GLUCOSE, CAPILLARY
Glucose-Capillary: 387 mg/dL — ABNORMAL HIGH (ref 70–99)
Glucose-Capillary: 246 mg/dL — ABNORMAL HIGH (ref 70–99)
Glucose-Capillary: 146 mg/dL — ABNORMAL HIGH (ref 70–99)
Glucose-Capillary: 168 mg/dL — ABNORMAL HIGH (ref 70–99)
Glucose-Capillary: 168 mg/dL — ABNORMAL HIGH (ref 70–99)
Glucose-Capillary: 236 mg/dL — ABNORMAL HIGH (ref 70–99)
Glucose-Capillary: 256 mg/dL — ABNORMAL HIGH (ref 70–99)
Glucose-Capillary: 152 mg/dL — ABNORMAL HIGH (ref 70–99)
Glucose-Capillary: 171 mg/dL — ABNORMAL HIGH (ref 70–99)
Glucose-Capillary: 158 mg/dL — ABNORMAL HIGH (ref 70–99)
Glucose-Capillary: 270 mg/dL — ABNORMAL HIGH (ref 70–99)
Glucose-Capillary: 479 mg/dL — ABNORMAL HIGH (ref 70–99)

## 2023-04-14 LAB — CULTURE, BLOOD (ROUTINE X 2): Special Requests: ADEQUATE

## 2023-04-14 MED ORDER — INSULIN ASPART 100 UNIT/ML IJ SOLN
5.0000 [IU] | Freq: Once | INTRAMUSCULAR | Status: AC
  Administered 2023-04-14: 5 [IU] via SUBCUTANEOUS

## 2023-04-14 MED ORDER — DEXTROSE-SODIUM CHLORIDE 5-0.45 % IV SOLN: INTRAVENOUS | Status: DC

## 2023-04-14 MED ORDER — INSULIN REGULAR(HUMAN) IN NACL 100-0.9 UT/100ML-% IV SOLN: INTRAVENOUS | Status: AC

## 2023-04-14 MED ORDER — INSULIN ASPART 100 UNIT/ML IJ SOLN
0.0000 [IU] | Freq: Three times a day (TID) | INTRAMUSCULAR | Status: DC
Start: 2023-04-15 — End: 2023-04-14

## 2023-04-14 MED ORDER — INSULIN ASPART 100 UNIT/ML IJ SOLN
0.0000 [IU] | Freq: Every day | INTRAMUSCULAR | Status: DC
  Administered 2023-04-14: 5 [IU] via SUBCUTANEOUS

## 2023-04-14 MED ORDER — INSULIN ASPART 100 UNIT/ML IJ SOLN
0.0000 [IU] | Freq: Three times a day (TID) | INTRAMUSCULAR | Status: DC
  Administered 2023-04-14: 5 [IU] via SUBCUTANEOUS
  Administered 2023-04-14: 2 [IU] via SUBCUTANEOUS
  Administered 2023-04-15: 5 [IU] via SUBCUTANEOUS

## 2023-04-14 MED ORDER — INSULIN ASPART 100 UNIT/ML IJ SOLN
2.0000 [IU] | Freq: Three times a day (TID) | INTRAMUSCULAR | Status: DC
  Administered 2023-04-14 – 2023-04-15 (×3): 2 [IU] via SUBCUTANEOUS

## 2023-04-14 MED ORDER — INSULIN GLARGINE-YFGN 100 UNIT/ML ~~LOC~~ SOLN
12.0000 [IU] | Freq: Every day | SUBCUTANEOUS | Status: DC
  Administered 2023-04-14 – 2023-04-15 (×2): 12 [IU] via SUBCUTANEOUS
  Filled 2023-04-14 (×2): qty 0.12

## 2023-04-14 NOTE — Plan of Care (Signed)
  Problem: Clinical Measurements: Goal: Ability to maintain clinical measurements within normal limits will improve Outcome: Progressing Goal: Will remain free from infection Outcome: Progressing Goal: Diagnostic test results will improve Outcome: Progressing Goal: Respiratory complications will improve Outcome: Progressing Goal: Cardiovascular complication will be avoided Outcome: Progressing   Problem: Coping: Goal: Level of anxiety will decrease Outcome: Progressing   Problem: Elimination: Goal: Will not experience complications related to bowel motility Outcome: Progressing Goal: Will not experience complications related to urinary retention Outcome: Progressing   Problem: Pain Managment: Goal: General experience of comfort will improve Outcome: Progressing   Problem: Safety: Goal: Ability to remain free from injury will improve Outcome: Progressing   

## 2023-04-14 NOTE — Inpatient Diabetes Management (Addendum)
Inpatient Diabetes Program Recommendations  AACE/ADA: New Consensus Statement on Inpatient Glycemic Control (2015)  Target Ranges:  Prepandial:   less than 140 mg/dL      Peak postprandial:   less than 180 mg/dL (1-2 hours)      Critically ill patients:  140 - 180 mg/dL   Lab Results  Component Value Date   GLUCAP 236 (H) 04/14/2023   HGBA1C 9.2 (H) 04/11/2023    Review of Glycemic Control  Diabetes history: DM1(does not make insulin.  Needs correction, basal and meal coverage)  Outpatient Diabetes medications:  Insulin pump-Medtronic/Contour next glucometer and Freestyle Libre 2 Basal settings 12 AM 0.45 units/hr 6 am 0.925 units/hr 8 am 0.6 units/hr 5 pm 0.7 units/hr 24-hr basal = 14.85 units NovoLog   Bolus settings I:C 12AM 15, 11AM 12 Sensitivity 65 Target 100-120 Active insulin time 5 hrs  Current orders for Inpatient glycemic control: IV insulin  Inpatient Diabetes Program Recommendations:    When MD is ready to transition to SQ insulin, please consider:  Semglee 12 units 2 hours prior to discontinuing drip, then QD Novolog 0-9 units TID Novolog 2 units TID with meals if she consumes at least 50%  Addendum@0835 :  Attempted to speak with patient at bedside.  She is not oriented.  Will call family today.  If she is not oriented at DC she will not be appropriate for her insulin pump and will need basal/bolus SQ.    Addendum@10 :11:  Spoke with son Marthann Schiller Petzold [phone].  He states his father has dementia and will not be able to administer insulin.  He states there is back up insulin at home, vials and syringes.  He is not sure what types of insulin are there.  I explained the insulin pump is not safe at home if she has mentation issues.  He states he and his siblings may be able to help her with insulin injections at home.  They would prefer the insulin pens.   Encouraged them to continue to use the [employer] 2.  They could increase the low alarm to 100  mg/dL in order to give them more time to treat/avoid hypoglycemia.    Benefit check per Roland Earl:  Lantus Solostar insulin pen & Novolog Flexpen  $0 co-pay    Will continue to follow while inpatient.  Thank you, Dulce Sellar, MSN, CDCES Diabetes Coordinator Inpatient Diabetes Program [phone] (team pager from 8a-5p)

## 2023-04-14 NOTE — Progress Notes (Signed)
Occupational Therapy Evaluation Patient Details Name: Madeline [name] MRN: [id number] DOB: [birth year] Today's Date: 04/14/2023   History of Present Illness Patient presenting to the ED on 04/11/23 with unresponsiveness and hypoglycemia with levels in the 30s. Intubated on 5/26 and extubated 5/27. Patient with episodes of hyperglycemia this hospital stay requiring intervention. PMH includes: CAD, GERD, DM2, hypothyroidism, dual cardiac pacemaker   Clinical Impression   Prior to this admission, patient living with her husband with Alzheimer's and was his primary caretaker. Patient was walking without AD, sponge bathing at baseline, completing ADLs with effort, however still driving and managing finances, medication, and groceries. Daughter present for evaluation, endorsing at least 2 falls in the last 6 months, and more forgetfulness when checking on the patient. Currently patient presenting with confusion, generalized weakness, and need for increased assist to complete all ADLs. Patient with significant posterior lean in standing x3, and unable to progress gait with mod A of 2. OT/PT completing stand pivot at mod A of 2 to recliner at end of session. Given patient's current level of recommending rehab of a lesser intensity < 3 hours in order to return to prior level. Patient's daughter is aware if the patient wanted to go home, patient would need 24/7 assist and require significant help. OT will continue to follow.      Recommendations for follow up therapy are one component of a multi-disciplinary discharge planning process, led by the attending physician.  Recommendations may be updated based on patient status, additional functional criteria and insurance authorization.   Assistance Recommended at Discharge Frequent or constant Supervision/Assistance (24/7)  Patient can return home with the following Two people to help with walking and/or transfers;A lot of help with  bathing/dressing/bathroom;Assistance with cooking/housework;Direct supervision/assist for medications management;Direct supervision/assist for financial management;Assist for transportation;Help with stairs or ramp for entrance    Functional Status Assessment  Patient has had a recent decline in their functional status and demonstrates the ability to make significant improvements in function in a reasonable and predictable amount of time.  Equipment Recommendations  BSC/3in1 (if going home)    Recommendations for Other Services       Precautions / Restrictions Precautions Precautions: Fall Restrictions Weight Bearing Restrictions: No      Mobility Bed Mobility Overal bed mobility: Needs Assistance Bed Mobility: Supine to Sit     Supine to sit: Max assist     General bed mobility comments: decreased initiation, requiring bed pad and PT in order to come into sitting, minimal activation noted    Transfers Overall transfer level: Needs assistance Equipment used: Rolling walker (2 wheels) Transfers: Sit to/from Stand, Bed to chair/wheelchair/BSC Sit to Stand: Mod assist, +2 physical assistance, +2 safety/equipment     Step pivot transfers: Mod assist, +2 physical assistance, +2 safety/equipment     General transfer comment: Patient with good initial stand, however heavy posterior lean, requiring up to mod A of 2 to maintain standing position. Patient then requiring up to mod A of 2 for remaining sit<>stands, completing 2 additional. Patient unable to advance gait to complete functional mobility, therefore having to complete stand pivot to recliner (mod A of 2). Posterior lean throughout despite max cues      Balance Overall balance assessment: Needs assistance Sitting-balance support: Bilateral upper extremity supported, Feet supported Sitting balance-Leahy Scale: Fair   Postural control: Posterior lean Standing balance support: During functional activity, Reliant on  assistive device for balance, Bilateral upper extremity supported Standing balance-Leahy Scale: Zero Standing balance comment:  heavily reliant on external assist                           ADL either performed or assessed with clinical judgement   ADL Overall ADL's : Needs assistance/impaired Eating/Feeding: Set up;Sitting   Grooming: Set up;Wash/dry face;Wash/dry hands   Upper Body Bathing: Minimal assistance;Sitting   Lower Body Bathing: Maximal assistance;Total assistance;Sit to/from stand;Sitting/lateral leans   Upper Body Dressing : Minimal assistance;Sitting   Lower Body Dressing: Maximal assistance;Total assistance;Sit to/from stand;Sitting/lateral leans   Toilet Transfer: Moderate assistance;+2 for physical assistance;+2 for safety/equipment;Stand-pivot Toilet Transfer Details (indicate cue type and reason): unable to take steps safely, significant posterior lean Toileting- Clothing Manipulation and Hygiene: Maximal assistance;Sitting/lateral lean;Sit to/from stand Toileting - Clothing Manipulation Details (indicate cue type and reason): unable to complete in standing     Functional mobility during ADLs: Moderate assistance;+2 for physical assistance;+2 for safety/equipment;Cueing for safety;Cueing for sequencing;Rolling walker (2 wheels) General ADL Comments: Patient presenting with confusion, generalized weakness, and need for increased assist to complete all ADLs. Patient with significant posterior lean in standing x3, and unable to progress gait with mod A of 2. OT/PT completing stand pivot at mod A of 2 to recliner at end of session. Patient's daughter present and stating that patient is the primary caretaker of her husband with Alzheimers. Patient's daugher endorses falls recently and need for increased assist.     Vision Baseline Vision/History: 1 Wears glasses (Reading) Ability to See in Adequate Light: 0 Adequate Patient Visual Report: No change from  baseline Vision Assessment?: No apparent visual deficits     Perception     Praxis      Pertinent Vitals/Pain Pain Assessment Pain Assessment: Faces Faces Pain Scale: No hurt     Hand Dominance Right   Extremity/Trunk Assessment Upper Extremity Assessment Upper Extremity Assessment: Generalized weakness   Lower Extremity Assessment Lower Extremity Assessment: Defer to PT evaluation   Cervical / Trunk Assessment Cervical / Trunk Assessment: Kyphotic (minimally)   Communication Communication Communication: No difficulties   Cognition Arousal/Alertness: Lethargic Behavior During Therapy: Flat affect Overall Cognitive Status: Impaired/Different from baseline Area of Impairment: Attention, Memory, Following commands, Safety/judgement, Awareness, Problem solving, Orientation                 Orientation Level: Time, Situation Current Attention Level: Sustained Memory: Decreased recall of precautions Following Commands: Follows one step commands with increased time, Follows multi-step commands inconsistently Safety/Judgement: Decreased awareness of safety, Decreased awareness of deficits Awareness: Emergent Problem Solving: Slow processing, Decreased initiation, Difficulty sequencing, Requires verbal cues, Requires tactile cues General Comments: Patient lethargic upon arrival, minimal communication, also HOH. Patient's daughter present, and stating that patient will tell us what we want to hear just so she can go home. Patient requiring verbal and tactile cues for basic movements, with minimal to no safety awareness in standing.     General Comments  VSS on RA    Exercises     Shoulder Instructions      Home Living Family/patient expects to be discharged to:: Private residence Living Arrangements: Spouse/significant other Available Help at Discharge: Family;Available 24 hours/day Type of Home: House Home Access: Stairs to enter [employer] of Steps:  2-3 Entrance Stairs-Rails: Left Home Layout: One level;Laundry or work area in basement     [employer]: Sponge bathes at baseline   [employer]: Standard     Home Equipment: Agricultural consultant (2 wheels);Cane - quad  Prior Functioning/Environment Prior Level of Function : Independent/Modified Independent             Mobility Comments: Mod indep with ADLs, household and community mobilization; no home O2; 2 falls in the last 6 months ADLs Comments: Mod I with ADLs IND for medication managment and cooking, spouse can no longer drive, drives, but family is concerned        OT Problem List: Decreased strength;Decreased range of motion;Decreased activity tolerance;Impaired balance (sitting and/or standing);Decreased coordination;Decreased cognition;Decreased safety awareness;Decreased knowledge of use of DME or AE;Decreased knowledge of precautions      OT Treatment/Interventions: Therapeutic exercise;Self-care/ADL training;Energy conservation;DME and/or AE instruction;Manual therapy;Therapeutic activities;Cognitive remediation/compensation;Patient/family education;Balance training    OT Goals(Current goals can be found in the care plan section) Acute Rehab OT Goals Patient Stated Goal: unable to state OT Goal Formulation: Patient unable to participate in goal setting Time For Goal Achievement: 04/28/23 Potential to Achieve Goals: Fair  OT Frequency: Min 2X/week    Co-evaluation PT/OT/SLP Co-Evaluation/Treatment: Yes Reason for Co-Treatment: For patient/therapist safety;To address functional/ADL transfers;Complexity of the patient's impairments (multi-system involvement)   OT goals addressed during session: ADL's and self-care      AM-PAC OT "6 Clicks" Daily Activity     Outcome Measure Help from another person eating meals?: A Little Help from another person taking care of personal grooming?: A Little Help from another person toileting, which includes  using toliet, bedpan, or urinal?: A Lot Help from another person bathing (including washing, rinsing, drying)?: A Lot Help from another person to put on and taking off regular upper body clothing?: A Little Help from another person to put on and taking off regular lower body clothing?: A Lot 6 Click Score: 15   End of Session Equipment Utilized During Treatment: Rolling walker (2 wheels);Gait belt Nurse Communication: Mobility status;Need for lift equipment Antony Salmon)  Activity Tolerance: Patient limited by fatigue;Patient limited by lethargy Patient left: in chair;with call bell/phone within reach;with chair alarm set;with family/visitor present  OT Visit Diagnosis: Unsteadiness on feet (R26.81);Other abnormalities of gait and mobility (R26.89);Repeated falls (R29.6);Muscle weakness (generalized) (M62.81);History of falling (Z91.81);Other symptoms and signs involving cognitive function;Adult, failure to thrive (R62.7)                Time: 1610-9604 OT Time Calculation (min): 32 min Charges:  OT General Charges $OT Visit: 1 Visit OT Evaluation $OT Eval Moderate Complexity: 1 Mod  Pollyann Glen E. Tiea Manninen, OTR/L Acute Rehabilitation Services [phone]   Cherlyn Cushing 04/14/2023, 2:51 PM

## 2023-04-14 NOTE — Care Management Important Message (Signed)
Important Message  Patient Details  Name: Madeline [name] MRN: [id number] Date of Birth: 04/02/1934   Medicare Important Message Given:  Yes     Renie Ora 04/14/2023, 11:43 AM

## 2023-04-14 NOTE — Evaluation (Signed)
Physical Therapy Evaluation Patient Details Name: Madeline [name] MRN: [id number] DOB: 20-Sep-1934 Today's Date: 04/14/2023  History of Present Illness  Patient presenting to the ED on 04/11/23 with unresponsiveness and hypoglycemia with levels in the 30s. Intubated on 5/26 and extubated 5/27. Patient with episodes of hyperglycemia this hospital stay requiring intervention. PMH includes: CAD, GERD, DM2, hypothyroidism, dual cardiac pacemaker   Clinical Impression  Pt presents with condition above and deficits mentioned below, see PT Problem List. PTA, she was mod I for functional mobility, living with her husband in a house with 2-3 STE. Her husband has health concerns also. Currently, pt displays deficits in cognition, activity tolerance, static and dynamic balance, power, and strength. She required maxA to transition supine to sit EOB and modAx2 to transfer to stand and take a couple steps with bil UE support today. She tends to lean posteriorly heavily, despite max cues provided to correct. She is at high risk for falls. At this time, pt would benefit from inpatient rehab, <3 hours/day. Will continue to follow acutely.       Recommendations for follow up therapy are one component of a multi-disciplinary discharge planning process, led by the attending physician.  Recommendations may be updated based on patient status, additional functional criteria and insurance authorization.  Follow Up Recommendations Can patient physically be transported by private vehicle: No     Assistance Recommended at Discharge Frequent or constant Supervision/Assistance  Patient can return home with the following  Two people to help with walking and/or transfers;A lot of help with bathing/dressing/bathroom;Assistance with cooking/housework;Direct supervision/assist for medications management;Direct supervision/assist for financial management;Assist for transportation;Help with stairs or ramp for entrance    Equipment  Recommendations BSC/3in1;Wheelchair cushion (measurements PT);Wheelchair (measurements PT);Hospital bed  Recommendations for Other Services       Functional Status Assessment Patient has had a recent decline in their functional status and demonstrates the ability to make significant improvements in function in a reasonable and predictable amount of time.     Precautions / Restrictions Precautions Precautions: Fall Restrictions Weight Bearing Restrictions: No      Mobility  Bed Mobility Overal bed mobility: Needs Assistance Bed Mobility: Supine to Sit     Supine to sit: Max assist, HOB elevated     General bed mobility comments: decreased initiation, requiring bed pad and grabbing onto hands of PT in order to come into sitting, minimal activation noted    Transfers Overall transfer level: Needs assistance Equipment used: Rolling walker (2 wheels), 2 person hand held assist Transfers: Sit to/from Stand, Bed to chair/wheelchair/BSC Sit to Stand: Mod assist, +2 physical assistance, +2 safety/equipment   Step pivot transfers: Mod assist, +2 physical assistance, +2 safety/equipment       General transfer comment: Patient with good initial stand, however heavy posterior lean, requiring up to mod A of 2 to maintain standing position. Patient then requiring up to mod A of 2 for remaining sit<>stands, completing 2 additional. Patient unable to advance gait to complete functional mobility, therefore having to complete stand pivot to recliner (mod A of 2) with HHA instead of RW. Posterior lean throughout despite max cues    Ambulation/Gait Ambulation/Gait assistance: Mod assist, +2 physical assistance, +2 safety/equipment Gait Distance (Feet): 2 Feet (x2 bouts of ~2 ft each bout) Assistive device: Rolling walker (2 wheels), 2 person hand held assist Gait Pattern/deviations: Step-to pattern, Decreased step length - right, Decreased step length - left, Decreased stride length, Decreased  weight shift to right, Decreased weight shift  to left, Trunk flexed, Leaning posteriorly Gait velocity: reduced Gait velocity interpretation: <1.31 ft/sec, indicative of household ambulator   General Gait Details: Pt leaning posteriorly heavily and with a flexed posture. Pt unable to correct with max cues and needing assistance to shift weight and intermittently advance feet to step anterior <> posterior at EOB 1x with RW and then step laterally to L to recliner with HHA  Stairs            Wheelchair Mobility    Modified Rankin (Stroke Patients Only)       Balance Overall balance assessment: Needs assistance Sitting-balance support: Bilateral upper extremity supported, Feet supported Sitting balance-Leahy Scale: Fair   Postural control: Posterior lean Standing balance support: During functional activity, Reliant on assistive device for balance, Bilateral upper extremity supported Standing balance-Leahy Scale: Zero Standing balance comment: heavily reliant on external assist, leans posteriorly despite max cues to correct                             Pertinent Vitals/Pain Pain Assessment Pain Assessment: Faces Faces Pain Scale: No hurt Pain Intervention(s): Monitored during session    Home Living Family/patient expects to be discharged to:: Private residence Living Arrangements: Spouse/significant other Available Help at Discharge: Family;Available 24 hours/day Type of Home: House Home Access: Stairs to enter Entrance Stairs-Rails: Left Entrance Stairs-Number of Steps: 2-3   Home Layout: One level;Laundry or work area in [employer] Equipment: Agricultural consultant (2 wheels);Cane - quad      Prior Function Prior Level of Function : Independent/Modified Independent             Mobility Comments: Mod indep with ADLs, household and community mobilization; no home O2; 2 falls in the last 6 months ADLs Comments: Mod I with ADLs IND for medication managment  and cooking, spouse can no longer drive, drives, but family is concerned     Hand Dominance   Dominant Hand: Right    Extremity/Trunk Assessment   Upper Extremity Assessment Upper Extremity Assessment: Defer to OT evaluation    Lower Extremity Assessment Lower Extremity Assessment: Generalized weakness (MMT scores of 4- to 4 grossly)    Cervical / Trunk Assessment Cervical / Trunk Assessment: Kyphotic (minimally)  Communication   Communication: No difficulties  Cognition Arousal/Alertness: Lethargic Behavior During Therapy: Flat affect Overall Cognitive Status: Impaired/Different from baseline Area of Impairment: Attention, Memory, Following commands, Safety/judgement, Awareness, Problem solving                   Current Attention Level: Sustained Memory: Decreased recall of precautions Following Commands: Follows one step commands with increased time, Follows multi-step commands inconsistently Safety/Judgement: Decreased awareness of safety, Decreased awareness of deficits Awareness: Emergent Problem Solving: Slow processing, Decreased initiation, Difficulty sequencing, Requires verbal cues, Requires tactile cues General Comments: Patient lethargic upon arrival, minimal communication, also HOH. Patient's daughter present, and stating that patient will tell us what we want to hear just so she can go home. Patient requiring verbal and tactile cues for basic movements, with minimal to no safety awareness in standing, leaning posteriorly continuously        General Comments General comments (skin integrity, edema, etc.): VSS on RA; educated daughter on watching SpO2 and placing Spencer back on as needed    Exercises Other Exercises Other Exercises: educated pt and daughter on IS use, x2 reps performed while PT leaving room   Assessment/Plan    PT Assessment Patient  needs continued PT services  PT Problem List Decreased strength;Decreased activity tolerance;Decreased  balance;Decreased mobility;Decreased cognition;Decreased safety awareness       PT Treatment Interventions DME instruction;Gait training;Stair training;Functional mobility training;Therapeutic activities;Therapeutic exercise;Balance training;Neuromuscular re-education;Cognitive remediation;Patient/family education    PT Goals (Current goals can be found in the Care Plan section)  Acute Rehab PT Goals Patient Stated Goal: to improve to eventually go home PT Goal Formulation: With patient/family Time For Goal Achievement: 04/28/23 Potential to Achieve Goals: Good    Frequency Min 1X/week     Co-evaluation PT/OT/SLP Co-Evaluation/Treatment: Yes Reason for Co-Treatment: For patient/therapist safety;To address functional/ADL transfers;Complexity of the patient's impairments (multi-system involvement) PT goals addressed during session: Mobility/safety with mobility;Proper use of DME;Balance OT goals addressed during session: ADL's and self-care       AM-PAC PT "6 Clicks" Mobility  Outcome Measure Help needed turning from your back to your side while in a flat bed without using bedrails?: A Lot Help needed moving from lying on your back to sitting on the side of a flat bed without using bedrails?: A Lot Help needed moving to and from a bed to a chair (including a wheelchair)?: Total Help needed standing up from a chair using your arms (e.g., wheelchair or bedside chair)?: Total Help needed to walk in hospital room?: Total Help needed climbing 3-5 steps with a railing? : Total 6 Click Score: 8    End of Session Equipment Utilized During Treatment: Gait belt Activity Tolerance: Patient tolerated treatment well Patient left: in chair;with call bell/phone within reach;with chair alarm set Nurse Communication: Mobility status PT Visit Diagnosis: Unsteadiness on feet (R26.81);Other abnormalities of gait and mobility (R26.89);Muscle weakness (generalized) (M62.81);Difficulty in walking, not  elsewhere classified (R26.2)    Time: 1610-9604 PT Time Calculation (min) (ACUTE ONLY): 33 min   Charges:   PT Evaluation $PT Eval Moderate Complexity: 1 Mod          Raymond Gurney, PT, DPT Acute Rehabilitation Services  Office: [phone]   Jewel Baize 04/14/2023, 3:12 PM

## 2023-04-14 NOTE — Consult Note (Addendum)
   [facility] CM Inpatient Consult  . [facility] Customer service manager [facility]) Accountable Care Organization (ACO) [facility] Liaison Note  04/14/2023  Madeline [name] [facility] June 01, 1934 [id number]   Insurance: [employer]   Madeline [name] is a 89 y.o. female who is a Primary Care Patient of Marguarite Arbour, MD. The patient was screened for risk score for unplanned readmission risk. The patient was assessed for potential [facility]) Care Management service needs for post hospital transition for care coordination. Review of patient's electronic medical record reveals patient is from home with family.   Plan: [facility] Liaison will continue to follow progress and disposition to asess for post hospital community care coordination/management needs.  Referral request for community care coordination: pending disposition.  04/15/23 10:50 am Was able to speak with daughter, Madeline [name], listed on patient contact in electronic medical record in the room via bedside phone, HIPAA was verified.  Explained reason for the call and hospital support and encouraged to contact HTA concierge for specific benefits patient may have for support.  Daughter states they currently are seeking SNF-rehab, and additional support when patient returns home.  Encouraged to speak with HTA concierge who can expound upon the benefits afforded this patient for community assistance.  She verbalized understanding. Will continue to follow.  Addendum: 04/16/23 11:45 am Left an appointment reminder card and 24 hour nurse advise line magnet at the bedside with family members present stating Madeline [name] had to leave.  Patient fas asleep   [facility] Care Management/Population Health does not replace or interfere with any arrangements made by the Inpatient Transition of Care team.   For questions contact:   Madeline Shanks, RN BSN CCM [facility]  [phone] business  mobile phone Toll free office [phone]  *Concierge Line  [phone] Fax number: [phone] [email or web address]

## 2023-04-14 NOTE — TOC Benefit Eligibility Note (Signed)
Patient Product/process development scientist completed.    The patient is currently admitted and upon discharge could be taking Lantus Pen.  The current 30 day co-pay is $0.00.   The patient is currently admitted and upon discharge could be taking Novolog FlexPen.  The current 30 day co-pay is $0.00.   The patient is insured through [employer] Part D   This test claim was processed through [facility]- copay amounts may vary at other pharmacies due to pharmacy/plan contracts, or as the patient moves through the different stages of their insurance plan.  Roland Earl, CPHT Pharmacy Patient Advocate Specialist [facility] Patient Advocate Team Direct Number: [phone]  Fax: [phone]

## 2023-04-14 NOTE — Progress Notes (Signed)
PROGRESS NOTE  Madeline [name]  ZOX:[id number] DOB: [birth year] DOA: 04/11/2023 PCP: Marguarite Arbour, MD   Brief Narrative: Patient is a 89 year old female with a past medical history of diabetes type 2 on insulin pump at home, coronary artery disease, hyperlipidemia, GERD, hypothyroidism, status post dual-chamber pacemaker who presents to the emergency department with severe hypoglycemia, encephalopathy.  Blood sugar was 39 on presentation.  Patient different encephalopathic even after correction of blood sugar and patient had to be intubated for airway protection.  Extubated on 5/27.  CXR on presentation also showed diffuse bilateral interstitial pulmonary opacity, consistent with edema or infection,started on antibiotics.  Currently hemodynamically stable, maintaining saturation on room air.  PT/OT consulted today.  Assessment & Plan:  Principal Problem:   Hypoglycemia  Acute metabolic encephalopathy in the setting of recurrent hypoglycemia: Mental status has improved and currently near baseline.  Monitor.  She knew that she is in the hospital and current month  Poorly controlled diabetes type 2: A1c of 9.2.  On insulin pump at home.  Presented with hypoglycemia.   Follows with endocrinology, Dr. Tedd Sias.  Diabetic monitor following.  Will follow-up with with diabetic coordinator  on dc  recommendation on insulin regimen. Started on long-acting and sliding scale insulin ,also meal coverage  Suspected community-acquired pneumonia versus aspiration pneumonia: Currently on antibiotics.  Maintaining saturation on room air this morning. CXR on presentation also showed diffuse bilateral interstitial pulmonary opacity, consistent with edema or infection,started on antibiotics.  Echo showed normal EF.  Complains of some cough  Hyperlipidemia: On Lipitor  GERD: Protonix  Tobacco dependence: Counseled cessation.  Continue nicotine patch  AKI: Resolved  Hyponatremia/hypokalemia: Stable  now  Weakness: PT/OT consulted.  Patient lives with her husband and states she does not have any problem ambulating     Nutrition Problem: Inadequate oral intake Etiology: inability to eat    DVT prophylaxis:heparin injection 5,000 Units Start: 04/11/23 1515 SCDs Start: 04/11/23 1507     Code Status: Full Code  Family Communication: Called and discussed with Son Mitch on 5/29  Patient status:Inpatient  Patient is from :Home  Anticipated discharge WJ:XBJY  Estimated DC date:1-2 days   Consultants: PCCM  Procedures:None  Antimicrobials:  Anti-infectives (From admission, onward)    Start     Dose/Rate Route Frequency Ordered Stop   04/13/23 0600  vancomycin (VANCOREADY) IVPB 750 mg/150 mL  Status:  Discontinued        750 mg 150 mL/hr over 60 Minutes Intravenous Every 24 hours 04/12/23 0352 04/12/23 1041   04/12/23 1130  azithromycin (ZITHROMAX) 500 mg in sodium chloride 0.9 % 250 mL IVPB        500 mg 250 mL/hr over 60 Minutes Intravenous Every 24 hours 04/12/23 1042 04/15/23 1129   04/12/23 0400  cefTRIAXone (ROCEPHIN) 2 g in sodium chloride 0.9 % 100 mL IVPB        2 g 200 mL/hr over 30 Minutes Intravenous Every 24 hours 04/11/23 1833 04/17/23 0359   04/12/23 0400  vancomycin (VANCOREADY) IVPB 1250 mg/250 mL        1,250 mg 166.7 mL/hr over 90 Minutes Intravenous  Once 04/12/23 0302 04/12/23 0618   04/11/23 1730  cefTRIAXone (ROCEPHIN) 1 g in sodium chloride 0.9 % 100 mL IVPB  Status:  Discontinued        1 g 200 mL/hr over 30 Minutes Intravenous Every 24 hours 04/11/23 1643 04/11/23 1833   04/11/23 1445  ceFEPIme (MAXIPIME) 2 g in sodium chloride 0.9 %  100 mL IVPB  Status:  Discontinued        2 g 200 mL/hr over 30 Minutes Intravenous  Once 04/11/23 1443 04/11/23 1646   04/11/23 1445  metroNIDAZOLE (FLAGYL) IVPB 500 mg        500 mg 100 mL/hr over 60 Minutes Intravenous  Once 04/11/23 1443 04/11/23 1555   04/11/23 1445  vancomycin (VANCOCIN) IVPB 1000 mg/200  mL premix  Status:  Discontinued        1,000 mg 200 mL/hr over 60 Minutes Intravenous  Once 04/11/23 1443 04/11/23 1813       Subjective: Patient seen and examined at bedside today.  Hemodynamically stable lying in bed.  Still looks weak.  She was alert and knew that she is in the hospital.  She knows the current month.  She complains of some cough.  On room air.  Objective: Vitals:   04/13/23 2045 04/14/23 0031 04/14/23 0500 04/14/23 0532  BP:  139/60  (!) 121/51  Pulse: (!) 103 (!) 103  88  Resp: 19 19    Temp:  98.7 F (37.1 C)  (!) 97.3 F (36.3 C)  TempSrc:  Oral  Oral  SpO2: 92%   90%  Weight:  55.8 kg 55.8 kg     Intake/Output Summary (Last 24 hours) at 04/14/2023 0811 Last data filed at 04/14/2023 0600 Gross per 24 hour  Intake 385.41 ml  Output 975 ml  Net -589.59 ml   Filed Weights   04/13/23 0957 04/14/23 0031 04/14/23 0500  Weight: 55.8 kg 55.8 kg 55.8 kg    Examination:  General exam: Overall comfortable, not in distress, acute pain, lying in bed HEENT: PERRL Respiratory system:  no wheezes or crackles  Cardiovascular system: S1 & S2 heard, RRR.  Gastrointestinal system: Abdomen is nondistended, soft and nontender. Central nervous system: Alert and oriented to place, knows current month Extremities: No edema, no clubbing ,no cyanosis Skin: No rashes, no ulcers,no icterus     Data Reviewed: I have personally reviewed following labs and imaging studies  CBC: Recent Labs  Lab 04/11/23 1242 04/11/23 1310 04/12/23 0434 04/13/23 0613  WBC 12.8*  --  10.5 9.0  NEUTROABS 10.3*  --  7.9*  --   HGB 11.8* 11.6* 10.9* 10.1*  HCT 36.6 34.0* 34.1* 31.2*  MCV 90.8  --  92.4 91.8  PLT 295  --  232 227   Basic Metabolic Panel: Recent Labs  Lab 04/11/23 1242 04/11/23 1310 04/11/23 1705 04/12/23 0434 04/12/23 1718 04/13/23 0613  NA 138 138  --  132*  --  138  K 3.4* 3.3* 5.3* 4.7  --  3.9  CL 101  --   --  99  --  100  CO2 26  --   --  25  --  26   GLUCOSE 137*  --   --  178*  --  197*  BUN 17  --   --  26*  --  11  CREATININE 0.86  --   --  1.09*  --  0.61  CALCIUM 8.7*  --   --  8.0*  --  7.9*  MG 2.8*  --  2.3 2.4 2.3 2.0  PHOS  --   --  4.6 4.7* 3.5 2.6     Recent Results (from the past 240 hour(s))  Blood Culture (routine x 2)     Status: None (Preliminary result)   Collection Time: 04/11/23  1:06 PM   Specimen: BLOOD RIGHT FOREARM  Result Value Ref Range Status   Specimen Description BLOOD RIGHT FOREARM  Final   Special Requests   Final    BOTTLES DRAWN AEROBIC AND ANAEROBIC Blood Culture adequate volume   Culture   Final    NO GROWTH 3 DAYS Performed at [facility], [address] [place], [place] [address]    Report Status PENDING  Incomplete  Blood Culture (routine x 2)     Status: None (Preliminary result)   Collection Time: 04/11/23  1:06 PM   Specimen: BLOOD  Result Value Ref Range Status   Specimen Description BLOOD RIGHT ANTECUBITAL  Final   Special Requests   Final    BOTTLES DRAWN AEROBIC AND ANAEROBIC Blood Culture results may not be optimal due to an inadequate volume of blood received in culture bottles   Culture   Final    NO GROWTH 3 DAYS Performed at [facility], [address] [place], [place] [address]    Report Status PENDING  Incomplete  MRSA Next Gen by PCR, Nasal     Status: None   Collection Time: 04/11/23  6:13 PM   Specimen: Nasal Mucosa; Nasal Swab  Result Value Ref Range Status   MRSA by PCR Next Gen NOT DETECTED NOT DETECTED Final    Comment: (NOTE) The GeneXpert MRSA Assay (FDA approved for NASAL specimens only), is one component of a comprehensive MRSA colonization surveillance program. It is not intended to diagnose MRSA infection nor to guide or monitor treatment for MRSA infections. Test performance is not FDA approved in patients less than 51 years old. Performed at [facility], [address] [place], [place] [address]   Culture, blood (Routine X  2) w Reflex to ID Panel     Status: None (Preliminary result)   Collection Time: 04/12/23  4:09 AM   Specimen: BLOOD LEFT HAND  Result Value Ref Range Status   Specimen Description BLOOD LEFT HAND  Final   Special Requests   Final    BOTTLES DRAWN AEROBIC AND ANAEROBIC Blood Culture adequate volume   Culture   Final    NO GROWTH 2 DAYS Performed at [facility], [address], [place], [place] [address]    Report Status PENDING  Incomplete  Culture, blood (Routine X 2) w Reflex to ID Panel     Status: None (Preliminary result)   Collection Time: 04/12/23  4:09 AM   Specimen: BLOOD LEFT HAND  Result Value Ref Range Status   Specimen Description BLOOD LEFT HAND  Final   Special Requests AEROBIC BOTTLE ONLY Blood Culture adequate volume  Final   Culture   Final    NO GROWTH 2 DAYS Performed at [facility], [address], [place], [place] [address]    Report Status PENDING  Incomplete     Radiology Studies: DG Abd Portable 1V  Result Date: 04/12/2023 CLINICAL DATA:  Feeding tube placement. EXAM: PORTABLE ABDOMEN - 1 VIEW COMPARISON:  None Available. FINDINGS: An enteric tube terminates in the left upper abdomen at the expected level of the gastric body, and the side port is also well below the diaphragm. No grossly dilated loops of bowel are evident in the included portion of the abdomen. Aortic atherosclerosis, prominent mitral annular calcifications, and a pacemaker are noted. IMPRESSION: Enteric tube in the stomach. Electronically Signed   By: Sebastian Ache M.D.   On: 04/12/2023 12:10   ECHOCARDIOGRAM LIMITED  Result Date: 04/12/2023  ECHOCARDIOGRAM LIMITED REPORT   Patient Name:   HANIFAH SCHIRRA Date of Exam: 04/12/2023 Medical Rec #:  [id number]        Height:       62.0 in Accession #:    [id number]       Weight:       126.1 lb Date of Birth:  1933-11-29         BSA:          1.571 m Patient Age:    88 years         BP:           117/58 mmHg Patient Gender: F                 HR:           83 bpm. Exam Location:  Inpatient Procedure: Limited Echo Indications:    Dyspnea R06.00  History:        Patient has prior history of Echocardiogram examinations, most                 recent 12/25/2021. CHF, CAD, Arrythmias:CHB,                 Signs/Symptoms:Hypotension; Risk Factors:Current Smoker,                 Dyslipidemia and Diabetes.  Sonographer:    Aron Baba Referring Phys: [phone] WHITNEY D HARRIS  Sonographer Comments: Echo performed with patient supine and on artificial respirator. IMPRESSIONS  1. Limited echo ordered by primary service.  2. Left ventricular ejection fraction, by estimation, is 60 to 65%. The left ventricle has normal function. Left ventricular endocardial border not optimally defined to evaluate regional wall motion, wall motion appears hyperdynamic in all segments except the lateral wall, which has less vigorous contraction. There is mild left ventricular hypertrophy. Left ventricular diastolic parameters are indeterminate due to severe mitral annular calcifications.  3. Right ventricular systolic function is normal. The right ventricular size is mildly enlarged. Tricuspid regurgitation signal is inadequate for assessing PA pressure.  4. The mitral valve is degenerative. Mild mitral valve regurgitation. Mild mitral stenosis. The mean mitral valve gradient is 5.5 mmHg with average heart rate of 85 bpm. Severe mitral annular calcification.  5. The aortic valve was not assessed. Aortic valve regurgitation is trivial. Aortic valve sclerosis/calcification is present, without any evidence of aortic stenosis.  6. The inferior vena cava is normal in size with <50% respiratory variability, suggesting right atrial pressure of 8 mmHg.  7. Right atrial size was mildly dilated. FINDINGS  Left Ventricle: Left ventricular ejection fraction, by estimation, is 60 to 65%. The left ventricle has normal function. Left ventricular endocardial border not optimally defined to evaluate  regional wall motion. There is mild left ventricular hypertrophy. Left ventricular diastolic parameters are indeterminate. Right Ventricle: The right ventricular size is mildly enlarged. Right vetricular wall thickness was not well visualized. Right ventricular systolic function is normal. Tricuspid regurgitation signal is inadequate for assessing PA pressure. Left Atrium: Left atrial size was normal in size. Right Atrium: Right atrial size was mildly dilated. Pericardium: There is no evidence of pericardial effusion. Mitral Valve: The mitral valve is degenerative in appearance. Severe mitral annular calcification. Mild mitral valve regurgitation. Mild mitral valve stenosis. The mean mitral valve gradient is 5.5 mmHg with average heart rate of 85 bpm. Tricuspid Valve: The tricuspid valve is grossly normal. Tricuspid valve regurgitation is trivial. Aortic Valve: The aortic valve was not assessed.  Aortic valve regurgitation is trivial. Aortic valve sclerosis/calcification is present, without any evidence of aortic stenosis. Aortic valve mean gradient measures 7.6 mmHg. Aortic valve peak gradient measures 13.4 mmHg. Aortic valve area, by VTI measures 2.01 cm. Pulmonic Valve: The pulmonic valve was not assessed. Aorta: The aortic root is normal in size and structure. Venous: IVC assessment for right atrial pressure unable to be performed due to mechanical ventilation. The inferior vena cava is normal in size with less than 50% respiratory variability, suggesting right atrial pressure of 8 mmHg. IAS/Shunts: The interatrial septum was not assessed. Additional Comments: Spectral Doppler performed. Color Doppler performed.  LEFT VENTRICLE PLAX 2D LVIDd:         3.40 cm LVIDs:         2.30 cm LV PW:         1.10 cm LV IVS:        0.90 cm LVOT diam:     2.12 cm LV SV:         67 LV SV Index:   42 LVOT Area:     3.54 cm  LEFT ATRIUM         Index LA diam:    3.30 cm 2.10 cm/m  AORTIC VALVE AV Area (Vmax):    1.61 cm AV  Area (Vmean):   1.58 cm AV Area (VTI):     2.01 cm AV Vmax:           182.87 cm/s AV Vmean:          130.656 cm/s AV VTI:            0.331 m AV Peak Grad:      13.4 mmHg AV Mean Grad:      7.6 mmHg LVOT Vmax:         82.96 cm/s LVOT Vmean:        58.337 cm/s LVOT VTI:          0.188 m LVOT/AV VTI ratio: 0.57 MITRAL VALVE MV Area (PHT): 4.06 cm     SHUNTS MV Mean grad:  5.5 mmHg     Systemic VTI:  0.19 m MV Decel Time: 187 msec     Systemic Diam: 2.12 cm MV E velocity: 122.00 cm/s MV A velocity: 180.00 cm/s MV E/A ratio:  0.68 Weston Brass MD Electronically signed by Weston Brass MD Signature Date/Time: 04/12/2023/10:12:42 AM    Final     Scheduled Meds:  atorvastatin  20 mg Oral Daily   Chlorhexidine Gluconate Cloth  6 each Topical Q0600   feeding supplement  237 mL Oral TID BM   heparin  5,000 Units Subcutaneous Q8H   multivitamin with minerals  1 tablet Oral Daily   nicotine  14 mg Transdermal Daily   Continuous Infusions:  azithromycin Stopped (04/13/23 1300)   cefTRIAXone (ROCEPHIN)  IV 2 g (04/14/23 0503)   dextrose 5 % and 0.45 % NaCl 75 mL/hr at 04/14/23 0444   insulin 4 Units/hr (04/14/23 0723)     LOS: 3 days   Burnadette Pop, MD [facility] P5/29/2024, 8:11 AM

## 2023-04-15 DIAGNOSIS — E162 Hypoglycemia, unspecified: Secondary | ICD-10-CM | POA: Diagnosis not present

## 2023-04-15 LAB — CBC
HCT: 32.4 % — ABNORMAL LOW (ref 36.0–46.0)
Hemoglobin: 10.7 g/dL — ABNORMAL LOW (ref 12.0–15.0)
MCH: 30.3 pg (ref 26.0–34.0)
MCHC: 33 g/dL (ref 30.0–36.0)
MCV: 91.8 fL (ref 80.0–100.0)
Platelets: 224 10*3/uL (ref 150–400)
RBC: 3.53 MIL/uL — ABNORMAL LOW (ref 3.87–5.11)
RDW: 13.8 % (ref 11.5–15.5)
WBC: 8 10*3/uL (ref 4.0–10.5)
nRBC: 0 % (ref 0.0–0.2)

## 2023-04-15 LAB — GLUCOSE, CAPILLARY
Glucose-Capillary: 220 mg/dL — ABNORMAL HIGH (ref 70–99)
Glucose-Capillary: 270 mg/dL — ABNORMAL HIGH (ref 70–99)
Glucose-Capillary: 442 mg/dL — ABNORMAL HIGH (ref 70–99)
Glucose-Capillary: 72 mg/dL (ref 70–99)
Glucose-Capillary: 214 mg/dL — ABNORMAL HIGH (ref 70–99)

## 2023-04-15 LAB — CULTURE, BLOOD (ROUTINE X 2)
Culture: NO GROWTH
Special Requests: ADEQUATE
Special Requests: ADEQUATE

## 2023-04-15 LAB — BASIC METABOLIC PANEL
Anion gap: 12 (ref 5–15)
BUN: 16 mg/dL (ref 8–23)
CO2: 26 mmol/L (ref 22–32)
Calcium: 8.9 mg/dL (ref 8.9–10.3)
Chloride: 102 mmol/L (ref 98–111)
Creatinine, Ser: 0.82 mg/dL (ref 0.44–1.00)
GFR, Estimated: >60 mL/min (ref >60)
Glucose, Bld: 271 mg/dL — ABNORMAL HIGH (ref 70–99)
Potassium: 3.6 mmol/L (ref 3.5–5.1)
Sodium: 140 mmol/L (ref 135–145)

## 2023-04-15 MED ORDER — LIVING WELL WITH DIABETES BOOK
Freq: Once | Status: AC
  Filled 2023-04-15: qty 1

## 2023-04-15 MED ORDER — INSULIN ASPART 100 UNIT/ML IJ SOLN
3.0000 [IU] | Freq: Three times a day (TID) | INTRAMUSCULAR | Status: DC
  Administered 2023-04-15 – 2023-04-16 (×3): 3 [IU] via SUBCUTANEOUS

## 2023-04-15 MED ORDER — INSULIN GLARGINE-YFGN 100 UNIT/ML ~~LOC~~ SOLN
3.0000 [IU] | Freq: Once | SUBCUTANEOUS | Status: AC
  Administered 2023-04-15: 3 [IU] via SUBCUTANEOUS
  Filled 2023-04-15: qty 0.03

## 2023-04-15 MED ORDER — INSULIN GLARGINE-YFGN 100 UNIT/ML ~~LOC~~ SOLN
15.0000 [IU] | Freq: Every day | SUBCUTANEOUS | Status: DC
  Administered 2023-04-16: 15 [IU] via SUBCUTANEOUS
  Filled 2023-04-15 (×2): qty 0.15

## 2023-04-15 MED ORDER — INSULIN ASPART 100 UNIT/ML IJ SOLN
0.0000 [IU] | Freq: Three times a day (TID) | INTRAMUSCULAR | Status: DC
  Administered 2023-04-15: 20 [IU] via SUBCUTANEOUS
  Administered 2023-04-16 (×2): 4 [IU] via SUBCUTANEOUS
  Administered 2023-04-16: 15 [IU] via SUBCUTANEOUS
  Administered 2023-04-17: 7 [IU] via SUBCUTANEOUS

## 2023-04-15 MED ORDER — INSULIN ASPART 100 UNIT/ML IJ SOLN: 0.0000 [IU] | Freq: Three times a day (TID) | INTRAMUSCULAR | Status: DC

## 2023-04-15 MED ORDER — INSULIN ASPART 100 UNIT/ML IJ SOLN
0.0000 [IU] | Freq: Every day | INTRAMUSCULAR | Status: DC
  Administered 2023-04-15: 2 [IU] via SUBCUTANEOUS

## 2023-04-15 NOTE — Inpatient Diabetes Management (Signed)
Inpatient Diabetes Program Recommendations  AACE/ADA: New Consensus Statement on Inpatient Glycemic Control (2015)  Target Ranges:  Prepandial:   less than 140 mg/dL      Peak postprandial:   less than 180 mg/dL (1-2 hours)      Critically ill patients:  140 - 180 mg/dL   Lab Results  Component Value Date   GLUCAP 270 (H) 04/15/2023   HGBA1C 9.2 (H) 04/11/2023    Review of Glycemic Control  Latest Reference Range & Units 04/14/23 10:43 04/14/23 11:47 04/14/23 16:18 04/14/23 20:47 04/15/23 01:10 04/15/23 06:05  Glucose-Capillary 70 - 99 mg/dL 161 (H) 096 (H) 045 (H) 479 (H) 220 (H) 270 (H)  (H): Data is abnormally high  Diabetes history: DM1(does not make insulin.  Needs correction, basal and meal coverage)   Outpatient Diabetes medications:  Insulin pump-Medtronic/Contour next glucometer and Freestyle Libre 2 Basal settings 12 AM 0.45 units/hr 6 am 0.925 units/hr 8 am 0.6 units/hr 5 pm 0.7 units/hr 24-hr basal = 14.85 units NovoLog   Bolus settings I:C 12AM 15, 11AM 12 Sensitivity 65 Target 100-120 Active insulin time 5 hrs  Current orders for Inpatient glycemic control: IV insulin  Inpatient Diabetes Program Recommendations:    Please consider increasing basal insulin:  1-Semglee 15 units QAM (could give addition 3 units now as she has received 12 units this am)  2-Novolog 4 units TID with meals   Spoke with daughter, granddaughter and daughter in law at bedside.  Plan for rehab most likely.  They have questions about diabetes, insulin injections and basic overall education.  Explained again that it is not safe for her to be using an insulin pump if she has any mentation issues.  They express understanding.    Educated on basic DM patho, diet, hypoglycemia, signs, symptoms and treatments.    Educated patient on insulin pen use at home. Reviewed contents of insulin flexpen starter kit. Reviewed all steps of insulin pen including attachment of needle, 2-unit air shot,  dialing up dose, giving injection, removing needle, disposal of sharps, storage of unused insulin, disposal of insulin etc. They downloaded the [facility] You Tube video on insulin pen administration.  This goes through every step in insulin pen administration.    Given her age an average BG of 180-200 mg/dL is appropriate.  Discusses alarms at length on her Freestyle Libre 2.  Increased low alarm to 100 mg/dL from 70 mg/dL.  Discussed LibreLink up.  This is an app the family can download on their smart phones and they can view their moms BG.  Her freestyle is on but her readers battery is dead.  Provided them with a Event organiser.    Ordered LWWD again for the family.    Will F/U with them tomorrow.    Thank you, Dulce Sellar, MSN, CDCES Diabetes Coordinator Inpatient Diabetes Program [phone] (team pager from 8a-5p)

## 2023-04-15 NOTE — Progress Notes (Signed)
Physical Therapy Treatment Patient Details Name: Madeline [name] MRN: [id number] DOB: 03-29-1934 Today's Date: 04/15/2023   History of Present Illness Patient presenting to the ED on 04/11/23 with unresponsiveness and hypoglycemia with levels in the 30s. Intubated on 5/26 and extubated 5/27. Patient with episodes of hyperglycemia this hospital stay requiring intervention. PMH includes: CAD, GERD, DM2, hypothyroidism, dual cardiac pacemaker    PT Comments    Pt tolerated treatment well today. Pt was highly motivated to participate in therapy today however was very impulsive not following commands consistently and demonstrating very poor safety awareness. Despite this, pt was able to progress ambulation requiring +2 Min/Mod A with RW for short distance ambulation in hallway. Per RN, CM, and family present in room, pt was refusing SNF however after much deliberation pt finally agreeable to SNF stating that she will go for "1 week". CM and RN made aware. PT will continue to follow.  Recommendations for follow up therapy are one component of a multi-disciplinary discharge planning process, led by the attending physician.  Recommendations may be updated based on patient status, additional functional criteria and insurance authorization.  Follow Up Recommendations  Can patient physically be transported by private vehicle: No    Assistance Recommended at Discharge Frequent or constant Supervision/Assistance  Patient can return home with the following Two people to help with walking and/or transfers;A lot of help with bathing/dressing/bathroom;Assistance with cooking/housework;Direct supervision/assist for medications management;Direct supervision/assist for financial management;Assist for transportation;Help with stairs or ramp for entrance   Equipment Recommendations  Other (comment) (Per accepting facility)    Recommendations for Other Services       Precautions / Restrictions  Precautions Precautions: Fall Restrictions Weight Bearing Restrictions: No     Mobility  Bed Mobility Overal bed mobility: Needs Assistance Bed Mobility: Supine to Sit     Supine to sit: Supervision, HOB elevated     General bed mobility comments: Pt came EOB and immediately tried to stand without AD.    Transfers Overall transfer level: Needs assistance Equipment used: Rolling walker (2 wheels) Transfers: Sit to/from Stand Sit to Stand: Min guard           General transfer comment: Pt very impulsive and tried to stand on her own multiple times despite being told to wait several times. Once settled pt was able to raise with Min G assist for safety.    Ambulation/Gait Ambulation/Gait assistance: Min assist, Mod assist, +2 safety/equipment, +2 physical assistance Gait Distance (Feet): 35 Feet Assistive device: Rolling walker (2 wheels) Gait Pattern/deviations: Scissoring, Ataxic, Drifts right/left, Staggering left, Staggering right, Trunk flexed, Decreased stride length, Step-through pattern, Narrow base of support Gait velocity: reduced     General Gait Details: Pt with very unsteady scissoring gait. Pt required constant verbal and tactile cues for RW proximity and safety. Pt not very receptive to cues likely due to [facility].   Stairs             Wheelchair Mobility    Modified Rankin (Stroke Patients Only)       Balance Overall balance assessment: Needs assistance Sitting-balance support: Bilateral upper extremity supported, Feet supported Sitting balance-Leahy Scale: Fair Sitting balance - Comments: Able to sit EOB briefly   Standing balance support: During functional activity, Reliant on assistive device for balance, Bilateral upper extremity supported Standing balance-Leahy Scale: Poor Standing balance comment: Reliant on RW  Cognition Arousal/Alertness: Awake/alert Behavior During Therapy: Impulsive, Flat  affect Overall Cognitive Status: Impaired/Different from baseline Area of Impairment: Attention, Memory, Following commands, Safety/judgement, Awareness, Problem solving                   Current Attention Level: Sustained Memory: Decreased recall of precautions Following Commands: Follows one step commands with increased time, Follows multi-step commands inconsistently Safety/Judgement: Decreased awareness of safety, Decreased awareness of deficits Awareness: Emergent Problem Solving: Slow processing, Decreased initiation, Difficulty sequencing, Requires verbal cues, Requires tactile cues General Comments: Pt was very impulsive today immediately trying to get OOB without AD and not following commands consistently. Pt also demonstrated decreased safety awareness with RW. Pt also noted to be very HOH.        Exercises      General Comments General comments (skin integrity, edema, etc.): VSS on RA      Pertinent Vitals/Pain Pain Assessment Pain Assessment: No/denies pain    Home Living                          Prior Function            PT Goals (current goals can now be found in the care plan section) Progress towards PT goals: Progressing toward goals    Frequency    Min 1X/week      PT Plan Current plan remains appropriate    Co-evaluation              AM-PAC PT "6 Clicks" Mobility   Outcome Measure  Help needed turning from your back to your side while in a flat bed without using bedrails?: A Little Help needed moving from lying on your back to sitting on the side of a flat bed without using bedrails?: A Little Help needed moving to and from a bed to a chair (including a wheelchair)?: A Lot Help needed standing up from a chair using your arms (e.g., wheelchair or bedside chair)?: A Little Help needed to walk in hospital room?: A Lot Help needed climbing 3-5 steps with a railing? : Total 6 Click Score: 14    End of Session Equipment  Utilized During Treatment: Gait belt Activity Tolerance: Patient tolerated treatment well Patient left: in chair;with call bell/phone within reach;with chair alarm set;with family/visitor present Nurse Communication: Mobility status PT Visit Diagnosis: Unsteadiness on feet (R26.81);Other abnormalities of gait and mobility (R26.89);Muscle weakness (generalized) (M62.81);Difficulty in walking, not elsewhere classified (R26.2)     Time: 1610-9604 PT Time Calculation (min) (ACUTE ONLY): 14 min  Charges:  $Gait Training: 8-22 mins                     Shela Nevin, PT, DPT Acute Rehab Services [phone]    Gladys Damme 04/15/2023, 3:15 PM

## 2023-04-15 NOTE — TOC Initial Note (Signed)
Transition of Care [facility]) - Initial/Assessment Note    Patient Details  Name: Madeline [name] MRN: [id number] Date of Birth: [birth year]  Transition of Care [facility] CM/SW Contact:    Leander Rams, LCSW Phone Number: 04/15/2023, 11:09 AM  Clinical Narrative:                 CSW met with pt at bedside alongside family present. CSW discussed the recommendation for pt to dc to STR at SNF. Pt is refusing SNF, although family strongly supports the need for it. Pt is open to [facility] services. CSW will inform CM of pt interest in [facility] services.   TOC will continue to follow.   Expected Discharge Plan: Skilled Nursing Facility Barriers to Discharge: Continued Medical Work up   Patient Goals and CMS Choice            Expected Discharge Plan and Services     Post Acute Care Choice: Skilled Nursing Facility Living arrangements for the past 2 months: Skilled Nursing Facility                                      Prior Living Arrangements/Services Living arrangements for the past 2 months: Skilled Nursing Facility Lives with:: Self, Spouse Patient language and need for interpreter reviewed:: Yes Do you feel safe going back to the place where you live?: Yes      Need for Family Participation in Patient Care: Yes (Comment) Care giver support system in place?: Yes (comment)   Criminal Activity/Legal Involvement Pertinent to Current Situation/Hospitalization: No - Comment as needed  Activities of Daily Living Home Assistive Devices/Equipment: Wheelchair ADL Screening (condition at time of admission) Patient's cognitive ability adequate to safely complete daily activities?: No Is the patient deaf or have difficulty hearing?: No Does the patient have difficulty seeing, even when wearing glasses/contacts?: No Does the patient have difficulty concentrating, remembering, or making decisions?: Yes Patient able to express need for assistance with ADLs?: Yes Does the patient have difficulty  dressing or bathing?: Yes Independently performs ADLs?: No Does the patient have difficulty walking or climbing stairs?: Yes Weakness of Legs: Both Weakness of Arms/Hands: None  Permission Sought/Granted      Share Information with NAME: Juliette Alcide     Permission granted to share info w Relationship: Daughter  Permission granted to share info w Contact Information: [phone]  Emotional Assessment Appearance:: Appears stated age Attitude/Demeanor/Rapport: Engaged   Orientation: : Oriented to Self, Oriented to Place, Oriented to  Time, Oriented to Situation Alcohol / Substance Use: Not Applicable Psych Involvement: No (comment)  Admission diagnosis:  Hypoglycemia [E16.2] Unresponsive [R41.89] Patient Active Problem List   Diagnosis Date Noted   Hypoglycemia 04/11/2023   Chronic diastolic CHF (congestive heart failure) (HCC) 10/04/2022   Diabetes mellitus without complication (HCC) 10/04/2022   Protein-calorie malnutrition, moderate (HCC) 10/04/2022   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) 12/22/2021   Hypotension 12/16/2021   Acute metabolic encephalopathy 12/16/2021   AKI (acute kidney injury) (HCC) 12/16/2021   Elevated troponin 12/16/2021   Hyperkalemia 12/16/2021   Depression 12/16/2021   DKA (diabetic ketoacidosis) (HCC) 12/15/2021   Hypothyroidism 12/15/2021   Complete heart block (HCC) 12/16/2018   AV block, Mobitz 2 12/16/2018   Bradycardia 09/21/2018   Diabetes (HCC) 09/21/2018   Chronic ITP (idiopathic thrombocytopenia) (HCC) 02/04/2018   Thrombocytopenia (HCC) 10/28/2016   COPD, moderate (HCC) 04/26/2014   CAD (coronary artery  disease) 08/14/2011   Dyslipidemia 08/14/2011   Tobacco abuse 08/14/2011   PCP:  Marguarite Arbour, MD Pharmacy:   [facility] [address] [place] - [address] [place] [address] Phone: [phone] Fax: [phone]  [facility] [address] [place]  [address] Phone: [phone] Fax: [phone]  [facility] [place], [place] - [address] [place] [address] Phone: [phone] Fax: [phone]     Social Determinants of Health (SDOH) Social History: SDOH Screenings   Food Insecurity: No Food Insecurity (09/02/2020)  Transportation Needs: No Transportation Needs (09/02/2020)  Depression (PHQ2-9): Low Risk  (09/02/2020)  Tobacco Use: High Risk (04/11/2023)   SDOH Interventions:     Readmission Risk Interventions    12/16/2021   10:51 AM  Readmission Risk Prevention Plan  Transportation Screening Complete  PCP or Specialist Appt within 5-7 Days Complete  Home Care Screening Complete  Medication Review (RN CM) Complete   Oletta Lamas, MSW, LCSWA, LCASA Transitions of Care  Clinical Social Worker I

## 2023-04-15 NOTE — Progress Notes (Signed)
PROGRESS NOTE  Madeline [name]  YNW:[id number] DOB: 1933-12-08 DOA: 04/11/2023 PCP: Marguarite Arbour, MD   Brief Narrative: Patient is a 89 year old female with a past medical history of diabetes type 2 on insulin pump at home, coronary artery disease, hyperlipidemia, GERD, hypothyroidism, status post dual-chamber pacemaker who presents to the emergency department with severe hypoglycemia, encephalopathy.  Blood sugar was 39 on presentation.  Patient different encephalopathic even after correction of blood sugar and patient had to be intubated for airway protection.  Extubated on 5/27.  CXR on presentation also showed diffuse bilateral interstitial pulmonary opacity, consistent with edema or infection,started on antibiotics.  Currently hemodynamically stable, maintaining saturation on room air.  PT/OT consulted, recommended SNF on discharge.  TOC consulted.  Medically stable for discharge whenever possible  Assessment & Plan:  Principal Problem:   Hypoglycemia  Acute metabolic encephalopathy in the setting of recurrent hypoglycemia: Mental status has improved and currently near baseline.   This morning she was alert and oriented.  Poorly controlled diabetes type 2: A1c of 9.2.  On insulin pump at home.  Presented with hypoglycemia.   Follows with endocrinology, Dr. Tedd Sias.  Diabetic coordinator following.   Started on long-acting and sliding scale insulin ,also meal coverage.  Diabetic coordinator recommending Lantus and NovoLog on discharge  Suspected community-acquired pneumonia versus aspiration pneumonia:   Maintaining saturation on room air this morning. CXR on presentation also showed diffuse bilateral interstitial pulmonary opacity, consistent with edema or infection,started on antibiotics: will complete ceftriaxone tomorrow.  Echo showed normal EF. Has  some cough  Hyperlipidemia: On Lipitor  GERD: Protonix  Tobacco dependence: Counseled cessation.  Continue nicotine patch  AKI:  Resolved  Hyponatremia/hypokalemia: Stable now  Weakness: PT/OT recommended SNF on dc.  Patient lives with her husband .     Nutrition Problem: Inadequate oral intake Etiology: inability to eat    DVT prophylaxis:heparin injection 5,000 Units Start: 04/11/23 1515 SCDs Start: 04/11/23 1507     Code Status: Full Code  Family Communication: Discussed with family members at bedside  Patient status:Inpatient  Patient is from :Home  Anticipated discharge to:SNF  Estimated DC date:whenever possible   Consultants: PCCM  Procedures:None  Antimicrobials:  Anti-infectives (From admission, onward)    Start     Dose/Rate Route Frequency Ordered Stop   04/13/23 0600  vancomycin (VANCOREADY) IVPB 750 mg/150 mL  Status:  Discontinued        750 mg 150 mL/hr over 60 Minutes Intravenous Every 24 hours 04/12/23 0352 04/12/23 1041   04/12/23 1130  azithromycin (ZITHROMAX) 500 mg in sodium chloride 0.9 % 250 mL IVPB        500 mg 250 mL/hr over 60 Minutes Intravenous Every 24 hours 04/12/23 1042 04/14/23 1216   04/12/23 0400  cefTRIAXone (ROCEPHIN) 2 g in sodium chloride 0.9 % 100 mL IVPB        2 g 200 mL/hr over 30 Minutes Intravenous Every 24 hours 04/11/23 1833 04/17/23 0359   04/12/23 0400  vancomycin (VANCOREADY) IVPB 1250 mg/250 mL        1,250 mg 166.7 mL/hr over 90 Minutes Intravenous  Once 04/12/23 0302 04/12/23 0618   04/11/23 1730  cefTRIAXone (ROCEPHIN) 1 g in sodium chloride 0.9 % 100 mL IVPB  Status:  Discontinued        1 g 200 mL/hr over 30 Minutes Intravenous Every 24 hours 04/11/23 1643 04/11/23 1833   04/11/23 1445  ceFEPIme (MAXIPIME) 2 g in sodium chloride 0.9 % 100 mL  IVPB  Status:  Discontinued        2 g 200 mL/hr over 30 Minutes Intravenous  Once 04/11/23 1443 04/11/23 1646   04/11/23 1445  metroNIDAZOLE (FLAGYL) IVPB 500 mg        500 mg 100 mL/hr over 60 Minutes Intravenous  Once 04/11/23 1443 04/11/23 1555   04/11/23 1445  vancomycin (VANCOCIN) IVPB  1000 mg/200 mL premix  Status:  Discontinued        1,000 mg 200 mL/hr over 60 Minutes Intravenous  Once 04/11/23 1443 04/11/23 1813       Subjective: Patient seen and examined at bedside today.  Family members at bedside.  She looks comfortable, lying in bed.  On room air.  Has some cough.  Denies any shortness of breath.  Alert and mostly oriented.  Objective: Vitals:   04/14/23 2336 04/15/23 0355 04/15/23 0358 04/15/23 0930  BP: (!) 160/86  (!) 169/69 137/60  Pulse: 96  88 88  Resp: 20  18 15   Temp: 97.6 F (36.4 C)  (!) 97.4 F (36.3 C) 97.6 F (36.4 C)  TempSrc: Oral  Oral Oral  SpO2: 93%  95% 92%  Weight:  54.3 kg      Intake/Output Summary (Last 24 hours) at 04/15/2023 1113 Last data filed at 04/14/2023 2346 Gross per 24 hour  Intake 880 ml  Output 1450 ml  Net -570 ml   Filed Weights   04/14/23 0031 04/14/23 0500 04/15/23 0355  Weight: 55.8 kg 55.8 kg 54.3 kg    Examination:  General exam: Overall comfortable, not in distress, pleasant elderly female HEENT: PERRL Respiratory system:  no wheezes or crackles  Cardiovascular system: S1 & S2 heard, RRR.  Gastrointestinal system: Abdomen is nondistended, soft and nontender. Central nervous system: Alert and mostly oriented Extremities: No edema, no clubbing ,no cyanosis Skin: No rashes, no ulcers,no icterus    Data Reviewed: I have personally reviewed following labs and imaging studies  CBC: Recent Labs  Lab 04/11/23 1242 04/11/23 1310 04/12/23 0434 04/13/23 0613 04/14/23 0845 04/15/23 0054  WBC 12.8*  --  10.5 9.0 8.0 8.0  NEUTROABS 10.3*  --  7.9*  --   --   --   HGB 11.8* 11.6* 10.9* 10.1* 10.5* 10.7*  HCT 36.6 34.0* 34.1* 31.2* 32.3* 32.4*  MCV 90.8  --  92.4 91.8 89.5 91.8  PLT 295  --  232 227 242 224   Basic Metabolic Panel: Recent Labs  Lab 04/11/23 1242 04/11/23 1310 04/11/23 1705 04/12/23 0434 04/12/23 1718 04/13/23 0613 04/14/23 0845 04/15/23 0054  NA 138 138  --  132*  --  138  141 140  K 3.4* 3.3* 5.3* 4.7  --  3.9 3.7 3.6  CL 101  --   --  99  --  100 102 102  CO2 26  --   --  25  --  26 28 26   GLUCOSE 137*  --   --  178*  --  197* 186* 271*  BUN 17  --   --  26*  --  11 14 16   CREATININE 0.86  --   --  1.09*  --  0.61 0.79 0.82  CALCIUM 8.7*  --   --  8.0*  --  7.9* 9.1 8.9  MG 2.8*  --  2.3 2.4 2.3 2.0  --   --   PHOS  --   --  4.6 4.7* 3.5 2.6  --   --  Recent Results (from the past 240 hour(s))  Blood Culture (routine x 2)     Status: None (Preliminary result)   Collection Time: 04/11/23  1:06 PM   Specimen: BLOOD RIGHT FOREARM  Result Value Ref Range Status   Specimen Description BLOOD RIGHT FOREARM  Final   Special Requests   Final    BOTTLES DRAWN AEROBIC AND ANAEROBIC Blood Culture adequate volume   Culture   Final    NO GROWTH 4 DAYS Performed at [facility], [address], [place], [place] [address]    Report Status PENDING  Incomplete  Blood Culture (routine x 2)     Status: None (Preliminary result)   Collection Time: 04/11/23  1:06 PM   Specimen: BLOOD  Result Value Ref Range Status   Specimen Description BLOOD RIGHT ANTECUBITAL  Final   Special Requests   Final    BOTTLES DRAWN AEROBIC AND ANAEROBIC Blood Culture results may not be optimal due to an inadequate volume of blood received in culture bottles   Culture   Final    NO GROWTH 4 DAYS Performed at [facility], [address] [place], [place] [address]    Report Status PENDING  Incomplete  MRSA Next Gen by PCR, Nasal     Status: None   Collection Time: 04/11/23  6:13 PM   Specimen: Nasal Mucosa; Nasal Swab  Result Value Ref Range Status   MRSA by PCR Next Gen NOT DETECTED NOT DETECTED Final    Comment: (NOTE) The GeneXpert MRSA Assay (FDA approved for NASAL specimens only), is one component of a comprehensive MRSA colonization surveillance program. It is not intended to diagnose MRSA infection nor to guide or monitor treatment for MRSA infections. Test  performance is not FDA approved in patients less than 20 years old. Performed at [facility], [address] [place], [place] [address]   Culture, blood (Routine X 2) w Reflex to ID Panel     Status: None (Preliminary result)   Collection Time: 04/12/23  4:09 AM   Specimen: BLOOD LEFT HAND  Result Value Ref Range Status   Specimen Description BLOOD LEFT HAND  Final   Special Requests   Final    BOTTLES DRAWN AEROBIC AND ANAEROBIC Blood Culture adequate volume   Culture   Final    NO GROWTH 3 DAYS Performed at [facility], [address], [place], [place] [address]    Report Status PENDING  Incomplete  Culture, blood (Routine X 2) w Reflex to ID Panel     Status: None (Preliminary result)   Collection Time: 04/12/23  4:09 AM   Specimen: BLOOD LEFT HAND  Result Value Ref Range Status   Specimen Description BLOOD LEFT HAND  Final   Special Requests AEROBIC BOTTLE ONLY Blood Culture adequate volume  Final   Culture   Final    NO GROWTH 3 DAYS Performed at [facility], [address] [place], [place] [address]    Report Status PENDING  Incomplete     Radiology Studies: No results found.  Scheduled Meds:  Chlorhexidine Gluconate Cloth  6 each Topical Q0600   feeding supplement  237 mL Oral TID BM   heparin  5,000 Units Subcutaneous Q8H   insulin aspart  0-5 Units Subcutaneous QHS   insulin aspart  0-9 Units Subcutaneous TID WC   insulin aspart  3 Units Subcutaneous TID WC   [START ON 04/16/2023] insulin glargine-yfgn  15  Units Subcutaneous Daily   insulin glargine-yfgn  3 Units Subcutaneous Once   living well with diabetes book   Does not apply Once   multivitamin with minerals  1 tablet Oral Daily   nicotine  14 mg Transdermal Daily   Continuous Infusions:  cefTRIAXone (ROCEPHIN)  IV 2 g (04/15/23 0355)     LOS: 4 days   Burnadette Pop, MD [facility] P5/30/2024, 11:13 AM

## 2023-04-16 DIAGNOSIS — E162 Hypoglycemia, unspecified: Secondary | ICD-10-CM | POA: Diagnosis not present

## 2023-04-16 LAB — GLUCOSE, CAPILLARY
Glucose-Capillary: 322 mg/dL — ABNORMAL HIGH (ref 70–99)
Glucose-Capillary: 159 mg/dL — ABNORMAL HIGH (ref 70–99)
Glucose-Capillary: 154 mg/dL — ABNORMAL HIGH (ref 70–99)
Glucose-Capillary: 68 mg/dL — ABNORMAL LOW (ref 70–99)
Glucose-Capillary: 154 mg/dL — ABNORMAL HIGH (ref 70–99)

## 2023-04-16 LAB — CULTURE, BLOOD (ROUTINE X 2)
Culture: NO GROWTH
Culture: NO GROWTH

## 2023-04-16 MED ORDER — LISINOPRIL 20 MG PO TABS
20.0000 mg | ORAL_TABLET | Freq: Every day | ORAL | Status: DC
  Administered 2023-04-16 – 2023-04-19 (×3): 20 mg via ORAL
  Filled 2023-04-16 (×4): qty 1

## 2023-04-16 NOTE — Progress Notes (Signed)
PROGRESS NOTE  Madeline [name]  ZOX:[id number] DOB: 01-Apr-1934 DOA: 04/11/2023 PCP: Marguarite Arbour, MD   Brief Narrative: Patient is a 89 year old female with a past medical history of diabetes type 2 on insulin pump at home, coronary artery disease, hyperlipidemia, GERD, hypothyroidism, status post dual-chamber pacemaker who presents to the emergency department with severe hypoglycemia, encephalopathy.  Blood sugar was 39 on presentation.  Patient different encephalopathic even after correction of blood sugar and patient had to be intubated for airway protection.  Extubated on 5/27.  CXR on presentation also showed diffuse bilateral interstitial pulmonary opacity, consistent with edema or infection,started on antibiotics.  Currently hemodynamically stable, maintaining saturation on room air.  PT/OT consulted, recommended SNF on discharge.  TOC consulted.  Medically stable for discharge whenever possible  Assessment & Plan:  Principal Problem:   Hypoglycemia  Acute metabolic encephalopathy in the setting of recurrent hypoglycemia: Mental status has improved and currently near baseline.   This morning she was alert and mostly oriented,hard of hearing.  Poorly controlled diabetes type 2: A1c of 9.2.  On insulin pump at home.  Presented with hypoglycemia.   Follows with endocrinology, Dr. Tedd Sias.  Diabetic coordinator following.   Started on long-acting and sliding scale insulin ,also meal coverage.  Diabetic coordinator recommending Lantus and NovoLog on discharge  Suspected community-acquired pneumonia versus aspiration pneumonia:   Maintaining saturation on room air this morning. CXR on presentation also showed diffuse bilateral interstitial pulmonary opacity, consistent with edema or infection,started on antibiotics,completed abs course. Echo showed normal EF. Has  some cough  Hypertension: Restarted her home lisinopril.  Hyperlipidemia: On Lipitor  GERD: Protonix  Tobacco dependence:  Counseled cessation.  Continue nicotine patch  AKI: Resolved  Hyponatremia/hypokalemia: Stable now  Weakness: PT/OT recommended SNF on dc.  Patient lives with her husband .     Nutrition Problem: Inadequate oral intake Etiology: inability to eat    DVT prophylaxis:heparin injection 5,000 Units Start: 04/11/23 1515 SCDs Start: 04/11/23 1507     Code Status: Full Code  Family Communication: Discussed with family members at bedside on 5/30  Patient status:Inpatient  Patient is from :Home  Anticipated discharge to:SNF  Estimated DC date:whenever possible   Consultants: PCCM  Procedures:None  Antimicrobials:  Anti-infectives (From admission, onward)    Start     Dose/Rate Route Frequency Ordered Stop   04/13/23 0600  vancomycin (VANCOREADY) IVPB 750 mg/150 mL  Status:  Discontinued        750 mg 150 mL/hr over 60 Minutes Intravenous Every 24 hours 04/12/23 0352 04/12/23 1041   04/12/23 1130  azithromycin (ZITHROMAX) 500 mg in sodium chloride 0.9 % 250 mL IVPB        500 mg 250 mL/hr over 60 Minutes Intravenous Every 24 hours 04/12/23 1042 04/14/23 1216   04/12/23 0400  cefTRIAXone (ROCEPHIN) 2 g in sodium chloride 0.9 % 100 mL IVPB        2 g 200 mL/hr over 30 Minutes Intravenous Every 24 hours 04/11/23 1833 04/16/23 0555   04/12/23 0400  vancomycin (VANCOREADY) IVPB 1250 mg/250 mL        1,250 mg 166.7 mL/hr over 90 Minutes Intravenous  Once 04/12/23 0302 04/12/23 0618   04/11/23 1730  cefTRIAXone (ROCEPHIN) 1 g in sodium chloride 0.9 % 100 mL IVPB  Status:  Discontinued        1 g 200 mL/hr over 30 Minutes Intravenous Every 24 hours 04/11/23 1643 04/11/23 1833   04/11/23 1445  ceFEPIme (MAXIPIME) 2  g in sodium chloride 0.9 % 100 mL IVPB  Status:  Discontinued        2 g 200 mL/hr over 30 Minutes Intravenous  Once 04/11/23 1443 04/11/23 1646   04/11/23 1445  metroNIDAZOLE (FLAGYL) IVPB 500 mg        500 mg 100 mL/hr over 60 Minutes Intravenous  Once 04/11/23  1443 04/11/23 1555   04/11/23 1445  vancomycin (VANCOCIN) IVPB 1000 mg/200 mL premix  Status:  Discontinued        1,000 mg 200 mL/hr over 60 Minutes Intravenous  Once 04/11/23 1443 04/11/23 1813       Subjective: Patient seen and examined at bedside today.  Hemodynamically stable lying in bed.  Denies any complaints.  On room air.  Again discussed about the importance of going to skilled nursing facility on discharge and she has agreed  Objective: Vitals:   04/15/23 1949 04/16/23 0016 04/16/23 0559 04/16/23 0920  BP: (!) 159/71 (!) 179/74 (!) 178/77 (!) 90/52  Pulse: 75 77 76   Resp: 17 19 (!) 23 (!) 24  Temp: 98 F (36.7 C) 98.1 F (36.7 C) 98.1 F (36.7 C) 98.3 F (36.8 C)  TempSrc: Oral Oral Oral Oral  SpO2: 92% 96% 94% 96%  Weight:   54.3 kg     Intake/Output Summary (Last 24 hours) at 04/16/2023 1130 Last data filed at 04/16/2023 0920 Gross per 24 hour  Intake 956 ml  Output 800 ml  Net 156 ml   Filed Weights   04/14/23 0500 04/15/23 0355 04/16/23 0559  Weight: 55.8 kg 54.3 kg 54.3 kg    Examination:   General exam: Overall comfortable, not in distress, pleasant elderly female HEENT: PERRL Respiratory system:  no wheezes or crackles  Cardiovascular system: S1 & S2 heard, RRR.  Gastrointestinal system: Abdomen is nondistended, soft and nontender. Central nervous system: Alert and oriented Extremities: No edema, no clubbing ,no cyanosis Skin: No rashes, no ulcers,no icterus    Data Reviewed: I have personally reviewed following labs and imaging studies  CBC: Recent Labs  Lab 04/11/23 1242 04/11/23 1310 04/12/23 0434 04/13/23 0613 04/14/23 0845 04/15/23 0054  WBC 12.8*  --  10.5 9.0 8.0 8.0  NEUTROABS 10.3*  --  7.9*  --   --   --   HGB 11.8* 11.6* 10.9* 10.1* 10.5* 10.7*  HCT 36.6 34.0* 34.1* 31.2* 32.3* 32.4*  MCV 90.8  --  92.4 91.8 89.5 91.8  PLT 295  --  232 227 242 224   Basic Metabolic Panel: Recent Labs  Lab 04/11/23 1242 04/11/23 1310  04/11/23 1705 04/12/23 0434 04/12/23 1718 04/13/23 0613 04/14/23 0845 04/15/23 0054  NA 138 138  --  132*  --  138 141 140  K 3.4* 3.3* 5.3* 4.7  --  3.9 3.7 3.6  CL 101  --   --  99  --  100 102 102  CO2 26  --   --  25  --  26 28 26   GLUCOSE 137*  --   --  178*  --  197* 186* 271*  BUN 17  --   --  26*  --  11 14 16   CREATININE 0.86  --   --  1.09*  --  0.61 0.79 0.82  CALCIUM 8.7*  --   --  8.0*  --  7.9* 9.1 8.9  MG 2.8*  --  2.3 2.4 2.3 2.0  --   --   PHOS  --   --  4.6 4.7* 3.5 2.6  --   --      Recent Results (from the past 240 hour(s))  Blood Culture (routine x 2)     Status: None   Collection Time: 04/11/23  1:06 PM   Specimen: BLOOD RIGHT FOREARM  Result Value Ref Range Status   Specimen Description BLOOD RIGHT FOREARM  Final   Special Requests   Final    BOTTLES DRAWN AEROBIC AND ANAEROBIC Blood Culture adequate volume   Culture   Final    NO GROWTH 5 DAYS Performed at [facility], [address], [place], [place] [address]    Report Status 04/16/2023 FINAL  Final  Blood Culture (routine x 2)     Status: None   Collection Time: 04/11/23  1:06 PM   Specimen: BLOOD  Result Value Ref Range Status   Specimen Description BLOOD RIGHT ANTECUBITAL  Final   Special Requests   Final    BOTTLES DRAWN AEROBIC AND ANAEROBIC Blood Culture results may not be optimal due to an inadequate volume of blood received in culture bottles   Culture   Final    NO GROWTH 5 DAYS Performed at [facility], [address], [place], [place] [address]    Report Status 04/16/2023 FINAL  Final  MRSA Next Gen by PCR, Nasal     Status: None   Collection Time: 04/11/23  6:13 PM   Specimen: Nasal Mucosa; Nasal Swab  Result Value Ref Range Status   MRSA by PCR Next Gen NOT DETECTED NOT DETECTED Final    Comment: (NOTE) The GeneXpert MRSA Assay (FDA approved for NASAL specimens only), is one component of a comprehensive MRSA colonization surveillance program. It is not intended  to diagnose MRSA infection nor to guide or monitor treatment for MRSA infections. Test performance is not FDA approved in patients less than 45 years old. Performed at [facility], [address] [place], [place] [address]   Culture, blood (Routine X 2) w Reflex to ID Panel     Status: None (Preliminary result)   Collection Time: 04/12/23  4:09 AM   Specimen: BLOOD LEFT HAND  Result Value Ref Range Status   Specimen Description BLOOD LEFT HAND  Final   Special Requests   Final    BOTTLES DRAWN AEROBIC AND ANAEROBIC Blood Culture adequate volume   Culture   Final    NO GROWTH 4 DAYS Performed at [facility], [address] [place], [place] [address]    Report Status PENDING  Incomplete  Culture, blood (Routine X 2) w Reflex to ID Panel     Status: None (Preliminary result)   Collection Time: 04/12/23  4:09 AM   Specimen: BLOOD LEFT HAND  Result Value Ref Range Status   Specimen Description BLOOD LEFT HAND  Final   Special Requests AEROBIC BOTTLE ONLY Blood Culture adequate volume  Final   Culture   Final    NO GROWTH 4 DAYS Performed at [facility], [address] [place], [place] [address]    Report Status PENDING  Incomplete     Radiology Studies: No results found.  Scheduled Meds:  Chlorhexidine Gluconate Cloth  6 each Topical Q0600   heparin  5,000 Units Subcutaneous Q8H   insulin aspart  0-20 Units Subcutaneous TID WC   insulin aspart  0-5 Units Subcutaneous QHS   insulin aspart  3 Units Subcutaneous TID WC   insulin glargine-yfgn  15 Units  Subcutaneous Daily   lisinopril  20 mg Oral Daily   multivitamin with minerals  1 tablet Oral Daily   nicotine  14 mg Transdermal Daily   Continuous Infusions:     LOS: 5 days   Burnadette Pop, MD [facility] P5/31/2024, 11:30 AM

## 2023-04-16 NOTE — NC FL2 (Signed)
MEDICAID FL2 LEVEL OF CARE FORM     IDENTIFICATION  Patient Name: Madeline [name] Birthdate: 1934-05-21 Sex: female Admission Date (Current Location): 04/11/2023  [facility] and [place] Number:  Producer, television/film/video and Address:  [facility], [address], [place], [place] [address]      Provider Number: [id number]  Attending Physician Name and Address:  Burnadette Pop, MD  Relative Name and Phone Number:       Current Level of Care: Hospital Recommended Level of Care: Skilled Nursing Facility Prior Approval Number:    Date Approved/Denied:   PASRR Number: [id number]  Discharge Plan: SNF    Current Diagnoses: Patient Active Problem List   Diagnosis Date Noted   Hypoglycemia 04/11/2023   Chronic diastolic CHF (congestive heart failure) (HCC) 10/04/2022   Diabetes mellitus without complication (HCC) 10/04/2022   Protein-calorie malnutrition, moderate (HCC) 10/04/2022   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) 12/22/2021   Hypotension 12/16/2021   Acute metabolic encephalopathy 12/16/2021   AKI (acute kidney injury) (HCC) 12/16/2021   Elevated troponin 12/16/2021   Hyperkalemia 12/16/2021   Depression 12/16/2021   DKA (diabetic ketoacidosis) (HCC) 12/15/2021   Hypothyroidism 12/15/2021   Complete heart block (HCC) 12/16/2018   AV block, [place] 2 12/16/2018   Bradycardia 09/21/2018   Diabetes (HCC) 09/21/2018   Chronic ITP (idiopathic thrombocytopenia) (HCC) 02/04/2018   Thrombocytopenia (HCC) 10/28/2016   COPD, moderate (HCC) 04/26/2014   CAD (coronary artery disease) 08/14/2011   Dyslipidemia 08/14/2011   Tobacco abuse 08/14/2011    Orientation RESPIRATION BLADDER Height & Weight     Self, Situation, Place  Normal Incontinent Weight: 119 lb 11.4 oz (54.3 kg) Height:     BEHAVIORAL SYMPTOMS/MOOD NEUROLOGICAL BOWEL NUTRITION STATUS      Continent    AMBULATORY STATUS COMMUNICATION OF NEEDS Skin   Limited Assist Verbally Normal                        Personal Care Assistance Level of Assistance  Bathing, Feeding, Dressing Bathing Assistance: Limited assistance Feeding assistance: Limited assistance Dressing Assistance: Limited assistance     Functional Limitations Info  Sight, Hearing, Speech Sight Info: Adequate Hearing Info: Impaired Speech Info: Adequate    SPECIAL CARE FACTORS FREQUENCY  PT (By licensed PT), OT (By licensed OT)                    Contractures Contractures Info: Not present    Additional Factors Info  Code Status Code Status Info: FULL CODE             Current Medications (04/16/2023):  This is the current hospital active medication list Current Facility-Administered Medications  Medication Dose Route Frequency Provider Last Rate Last Admin   acetaminophen (TYLENOL) tablet 650 mg  650 mg Oral Q6H PRN Carilyn Goodpasture, MD       Chlorhexidine Gluconate Cloth 2 % PADS 6 each  6 each Topical Q0600 Omar Person, MD   6 each at 04/16/23 0654   docusate sodium (COLACE) capsule 100 mg  100 mg Oral BID PRN Cheri Fowler, MD       heparin injection 5,000 Units  5,000 Units Subcutaneous Q8H Harris, Alphonzo Lemmings D, NP   5,000 Units at 04/16/23 0522   insulin aspart (novoLOG) injection 0-20 Units  0-20 Units Subcutaneous TID WC Burnadette Pop, MD   15 Units at 04/16/23 0732   insulin aspart (novoLOG) injection 0-5 Units  0-5 Units Subcutaneous QHS Burnadette Pop, MD   2 Units at 04/15/23 2201   insulin aspart (novoLOG) injection 3 Units  3 Units Subcutaneous TID WC Burnadette Pop, MD   3 Units at 04/15/23 1156   insulin glargine-yfgn (SEMGLEE) injection 15 Units  15 Units Subcutaneous Daily Burnadette Pop, MD   15 Units at 04/16/23 0925   labetalol (NORMODYNE) injection 5 mg  5 mg Intravenous QID PRN Nooruddin, Saad, MD       lisinopril (ZESTRIL) tablet 20 mg  20 mg Oral Daily Adhikari, Amrit, MD       multivitamin with minerals tablet 1 tablet  1 tablet Oral Daily Cheri Fowler, MD   1 tablet at 04/16/23 4540   nicotine (NICODERM CQ - dosed in mg/24 hours) patch 14 mg  14 mg Transdermal Daily Olalere, Adewale A, MD   14 mg at 04/16/23 0926   ondansetron (ZOFRAN) injection 4 mg  4 mg Intravenous Q6H PRN Cheri Fowler, MD   4 mg at 04/13/23 2334   Oral care mouth rinse  15 mL Mouth Rinse PRN Olalere, Adewale A, MD       polyethylene glycol (MIRALAX / GLYCOLAX) packet 17 g  17 g Oral Daily PRN Cheri Fowler, MD         Discharge Medications: Please see discharge summary for a list of discharge medications.  Relevant Imaging Results:  Relevant Lab Results:   Additional Information SS# [id number]  Deatra Robinson, [place]

## 2023-04-16 NOTE — Inpatient Diabetes Management (Signed)
Inpatient Diabetes Program Recommendations  AACE/ADA: New Consensus Statement on Inpatient Glycemic Control (2015)  Target Ranges:  Prepandial:   less than 140 mg/dL      Peak postprandial:   less than 180 mg/dL (1-2 hours)      Critically ill patients:  140 - 180 mg/dL   Lab Results  Component Value Date   GLUCAP 159 (H) 04/16/2023   HGBA1C 9.2 (H) 04/11/2023    Review of Glycemic Control  Latest Reference Range & Units 04/15/23 06:05 04/15/23 11:21 04/15/23 16:58 04/15/23 21:23 04/16/23 06:17 04/16/23 11:59  Glucose-Capillary 70 - 99 mg/dL 161 (H) 096 (H) 72 045 (H) 322 (H) 159 (H)  (H): Data is abnormally high  Diabetes history: DM1(does not make insulin.  Needs correction, basal and meal coverage)   Outpatient Diabetes medications:  Insulin pump-Medtronic/Contour next glucometer and Freestyle Libre 2 Basal settings 12 AM 0.45 units/hr 6 am 0.925 units/hr 8 am 0.6 units/hr 5 pm 0.7 units/hr 24-hr basal = 14.85 units NovoLog   Bolus settings I:C 12AM 15, 11AM 12 Sensitivity 65 Target 100-120 Active insulin time 5 hrs  Current orders for Inpatient glycemic control: Semglee 15 QAM, Novolog 0-20 units TID and 0-5 units QHS, Novolog 3 units TID with meals   Inpatient Diabetes Program Recommendations:    She has received a total of 52 units of Novolog in the last 24 hrs.  Please consider:  Semglee 20 QAM (could administer 5 additional units now as she received 15 this morning) Novolog 0-15 units TID & 0-5 QHS Novlog 5 units TID with meals  Will continue to follow while inpatient.  Thank you, Dulce Sellar, MSN, CDCES Diabetes Coordinator Inpatient Diabetes Program [phone] (team pager from 8a-5p)

## 2023-04-16 NOTE — Progress Notes (Signed)
Occupational Therapy Treatment Patient Details Name: Madeline [name] MRN: [id number] DOB: 03/30/1934 Today's Date: 04/16/2023   History of present illness Patient presenting to the ED on 04/11/23 with unresponsiveness and hypoglycemia with levels in the 30s. Intubated on 5/26 and extubated 5/27. Patient with episodes of hyperglycemia this hospital stay requiring intervention. PMH includes: CAD, GERD, DM2, hypothyroidism, dual cardiac pacemaker   OT comments  Pt. Seen for skilled OT treatment session.  Able to complete bed mobilty S.  Stand pivot to recliner min guard/ min a to pivot.  Some cues for hand placement on arm rests.  Pt. Eager and wanting to eat and drink once up in the recliner.  Will cont. With current acute OT poc.     Recommendations for follow up therapy are one component of a multi-disciplinary discharge planning process, led by the attending physician.  Recommendations may be updated based on patient status, additional functional criteria and insurance authorization.    Assistance Recommended at Discharge Frequent or constant Supervision/Assistance  Patient can return home with the following  Two people to help with walking and/or transfers;A lot of help with bathing/dressing/bathroom;Assistance with cooking/housework;Direct supervision/assist for medications management;Direct supervision/assist for financial management;Assist for transportation;Help with stairs or ramp for entrance   Equipment Recommendations  BSC/3in1    Recommendations for Other Services      Precautions / Restrictions Precautions Precautions: Fall       Mobility Bed Mobility Overal bed mobility: Needs Assistance Bed Mobility: Supine to Sit     Supine to sit: Supervision, HOB elevated     General bed mobility comments: less impulsive while seated eob this day    Transfers Overall transfer level: Needs assistance Equipment used: 1 person hand held assist Transfers: Bed to  chair/wheelchair/BSC Sit to Stand: Min guard Stand pivot transfers: Min guard, Min assist         General transfer comment: less impulsive than previously documented.  able to follow one step commands consistently and follow cues for safe pivot transfer to recliner     Balance                                           ADL either performed or assessed with clinical judgement   ADL Overall ADL's : Needs assistance/impaired Eating/Feeding: Set up;Sitting                       Toilet Transfer: Minimal assistance;Stand-pivot Toilet Transfer Details (indicate cue type and reason): observed during transfer from eob to recliner           General ADL Comments: pt. completed bed mobility, stand pivot to recliner, some assistance required to organize food and beverage items on her tray    Extremity/Trunk Assessment              Vision       Perception     Praxis      Cognition Arousal/Alertness: Awake/alert Behavior During Therapy: [facility] for tasks assessed/performed, Impulsive                                            Exercises      Shoulder Instructions       General Comments      Pertinent Vitals/  Pain       Pain Assessment Pain Assessment: No/denies pain  Home Living                                          Prior Functioning/Environment              Frequency  Min 2X/week        Progress Toward Goals  OT Goals(current goals can now be found in the care plan section)  Progress towards OT goals: Progressing toward goals     Plan Discharge plan remains appropriate    Co-evaluation                 AM-PAC OT "6 Clicks" Daily Activity     Outcome Measure   Help from another person eating meals?: A Little Help from another person taking care of personal grooming?: A Little Help from another person toileting, which includes using toliet, bedpan, or urinal?: A Lot Help  from another person bathing (including washing, rinsing, drying)?: A Lot Help from another person to put on and taking off regular upper body clothing?: A Little Help from another person to put on and taking off regular lower body clothing?: A Lot 6 Click Score: 15    End of Session    OT Visit Diagnosis: Unsteadiness on feet (R26.81);Other abnormalities of gait and mobility (R26.89);Repeated falls (R29.6);Muscle weakness (generalized) (M62.81);History of falling (Z91.81);Other symptoms and signs involving cognitive function;Adult, failure to thrive (R62.7)   Activity Tolerance Patient tolerated treatment well   Patient Left in chair;with call bell/phone within reach;with chair alarm set   Nurse Communication          Time: [phone] OT Time Calculation (min): 15 min  Charges: OT General Charges $OT Visit: 1 Visit OT Treatments $Self Care/Home Management : 8-22 mins  Boneta Lucks, COTA/L Acute Rehabilitation [phone]   Alessandra Bevels Lorraine-COTA/L 04/16/2023, 12:01 PM

## 2023-04-16 NOTE — TOC Progression Note (Signed)
Transition of Care [facility]) - Progression Note    Patient Details  Name: Madeline [name] MRN: [id number] Date of Birth: 02/07/1934  Transition of Care [facility]) CM/SW Contact  Dellie Burns [place], [place] Phone Number: 04/16/2023, 11:25 AM  Clinical Narrative: Per MD, pt agreeable to SNF today. Spoke to pt's dtr Madeline [name] re SNF placement as pt with intermittent confusion. Of note, pt's husband unable to assist with decisions due to Alzheimer's diagnosis.   Pt's dtr agreeable to SNF placement, no preferred facility indicated. Will start SNF search and submit auth request with [employer] for SNF and ambulance transport. SW will provide updates as available.   Dellie Burns, MSW, LCSW [phone] (coverage)         Expected Discharge Plan: Skilled Nursing Facility Barriers to Discharge: Continued Medical Work up  Expected Discharge Plan and Services     Post Acute Care Choice: Skilled Nursing Facility Living arrangements for the past 2 months: Skilled Nursing Facility                                       Social Determinants of Health (SDOH) Interventions SDOH Screenings   Food Insecurity: No Food Insecurity (09/02/2020)  Transportation Needs: No Transportation Needs (09/02/2020)  Depression (PHQ2-9): Low Risk  (09/02/2020)  Tobacco Use: High Risk (04/11/2023)    Readmission Risk Interventions    12/16/2021   10:51 AM  Readmission Risk Prevention Plan  Transportation Screening Complete  PCP or Specialist Appt within 5-7 Days Complete  Home Care Screening Complete  Medication Review (RN CM) Complete

## 2023-04-17 DIAGNOSIS — E162 Hypoglycemia, unspecified: Secondary | ICD-10-CM | POA: Diagnosis not present

## 2023-04-17 LAB — GLUCOSE, CAPILLARY
Glucose-Capillary: 284 mg/dL — ABNORMAL HIGH (ref 70–99)
Glucose-Capillary: 437 mg/dL — ABNORMAL HIGH (ref 70–99)
Glucose-Capillary: 228 mg/dL — ABNORMAL HIGH (ref 70–99)
Glucose-Capillary: 193 mg/dL — ABNORMAL HIGH (ref 70–99)

## 2023-04-17 MED ORDER — ADULT MULTIVITAMIN W/MINERALS CH: 1.0000 | ORAL_TABLET | Freq: Every day | ORAL | Status: DC

## 2023-04-17 MED ORDER — INSULIN ASPART 100 UNIT/ML IJ SOLN: 5.0000 [IU] | Freq: Three times a day (TID) | INTRAMUSCULAR | 11 refills | Status: DC

## 2023-04-17 MED ORDER — INSULIN GLARGINE-YFGN 100 UNIT/ML ~~LOC~~ SOLN: 20.0000 [IU] | Freq: Every day | SUBCUTANEOUS | 11 refills | Status: DC

## 2023-04-17 MED ORDER — INSULIN ASPART 100 UNIT/ML IJ SOLN
5.0000 [IU] | Freq: Three times a day (TID) | INTRAMUSCULAR | Status: DC
  Administered 2023-04-17 – 2023-04-19 (×7): 5 [IU] via SUBCUTANEOUS

## 2023-04-17 MED ORDER — INSULIN ASPART 100 UNIT/ML IJ SOLN
5.0000 [IU] | Freq: Once | INTRAMUSCULAR | Status: AC
  Administered 2023-04-17: 5 [IU] via SUBCUTANEOUS

## 2023-04-17 MED ORDER — INSULIN ASPART 100 UNIT/ML IJ SOLN
0.0000 [IU] | Freq: Three times a day (TID) | INTRAMUSCULAR | Status: DC
  Administered 2023-04-17: 8 [IU] via SUBCUTANEOUS
  Administered 2023-04-17: 5 [IU] via SUBCUTANEOUS
  Administered 2023-04-18: 15 [IU] via SUBCUTANEOUS
  Administered 2023-04-18: 3 [IU] via SUBCUTANEOUS
  Administered 2023-04-19: 8 [IU] via SUBCUTANEOUS
  Administered 2023-04-19: 3 [IU] via SUBCUTANEOUS

## 2023-04-17 MED ORDER — POLYETHYLENE GLYCOL 3350 17 G PO PACK: 17.0000 g | PACK | Freq: Every day | ORAL | 0 refills | Status: DC | PRN

## 2023-04-17 MED ORDER — INSULIN GLARGINE-YFGN 100 UNIT/ML ~~LOC~~ SOLN
20.0000 [IU] | Freq: Every day | SUBCUTANEOUS | Status: DC
  Administered 2023-04-17 – 2023-04-19 (×3): 20 [IU] via SUBCUTANEOUS
  Filled 2023-04-17 (×3): qty 0.2

## 2023-04-17 MED ORDER — INSULIN ASPART 100 UNIT/ML IJ SOLN: 0.0000 [IU] | Freq: Three times a day (TID) | INTRAMUSCULAR | 11 refills | Status: DC

## 2023-04-17 MED ORDER — NICOTINE 14 MG/24HR TD PT24: 14.0000 mg | MEDICATED_PATCH | Freq: Every day | TRANSDERMAL | 0 refills | Status: DC

## 2023-04-17 NOTE — Inpatient Diabetes Management (Signed)
Inpatient Diabetes Program Recommendations  AACE/ADA: New Consensus Statement on Inpatient Glycemic Control (2015)  Target Ranges:  Prepandial:   less than 140 mg/dL      Peak postprandial:   less than 180 mg/dL (1-2 hours)      Critically ill patients:  140 - 180 mg/dL   Lab Results  Component Value Date   GLUCAP 284 (H) 04/17/2023   HGBA1C 9.2 (H) 04/11/2023    Review of Glycemic Control Spoke with patient's daughter to further reinforce discussion with DM. Reviewed target goals, insulin injections, survival skills, interventions, frequency of checking CBGs current inpatient dosing and when to call PCP following dc from rehab. No further questions at this time.   Thanks, Lujean Rave, MSN, RNC-OB Diabetes Coordinator [phone] (8a-5p)

## 2023-04-17 NOTE — Discharge Summary (Signed)
Physician Discharge Summary  Madeline [name] ZOX:[id number] DOB: [birth year] DOA: 04/11/2023  PCP: Marguarite Arbour, MD  Admit date: 04/11/2023 Discharge date: 04/19/2023  Admitted From: Home Disposition:  SNF  Discharge Condition:Stable CODE STATUS:FULL Diet recommendation: Carb Modified   Brief/Interim Summary: Patient is a 89 year old female with a past medical history of diabetes type 2 on insulin pump at home, coronary artery disease, hyperlipidemia, GERD, hypothyroidism, status post dual-chamber pacemaker who presents to the emergency department with severe hypoglycemia, encephalopathy.  Blood sugar was 39 on presentation.  Patient different encephalopathic even after correction of blood sugar and patient had to be intubated for airway protection.  Extubated on 5/27.  CXR on presentation also showed diffuse bilateral interstitial pulmonary opacity, consistent with edema or infection,started on antibiotics.  Currently hemodynamically stable, maintaining saturation on room air.  PT/OT consulted, recommended SNF on discharge.  TOC consulted.  Medically stable for discharge whenever possible   Following problems were addressed during the hospitalization:  Acute metabolic encephalopathy in the setting of recurrent hypoglycemia: Mental status has improved and currently near baseline.   This morning she was alert and mostly oriented,hard of hearing.   Poorly controlled diabetes type 2: A1c of 9.2.  On insulin pump at home.  Presented with hypoglycemia.   Follows with endocrinology, Dr. Tedd Sias.  Diabetic coordinator following.   Started on long-acting and sliding scale insulin ,also meal coverage.  Diabetic coordinator recommending Lantus and NovoLog on discharge.  Continue current insulin regimen and adjust accordingly   Suspected community-acquired pneumonia versus aspiration pneumonia:   Maintaining saturation on room air this morning. CXR on presentation also showed diffuse bilateral interstitial  pulmonary opacity, consistent with edema or infection,started on antibiotics,completed abs course. Echo showed normal EF.   Hypertension: Restarted her home lisinopril.   Hyperlipidemia: On Lipitor   GERD: Protonix   Tobacco dependence: Counseled cessation.  Continue nicotine patch   AKI: Resolved   Hyponatremia/hypokalemia: Stable now   Weakness: PT/OT recommended SNF on dc.  Patient lives with her husband .   Discharge Diagnoses:  Principal Problem:   Hypoglycemia    Discharge Instructions  Discharge Instructions     Amb Referral to Nutrition and Diabetic Education   Complete by: As directed    Poor DM control, extreme shifts in highs and lows at home. Poor eating habits   Diet Carb Modified   Complete by: As directed    Discharge instructions   Complete by: As directed    1)Please take prescribed medications as instructed 2)Monitor your blood sugars 3)Do a CBC and BMP tests in a week   Increase activity slowly   Complete by: As directed    No wound care   Complete by: As directed       Allergies as of 04/19/2023       Reactions   Darvon [propoxyphene] Nausea And Vomiting   Sulfa Antibiotics Nausea And Vomiting        Medication List     STOP taking these medications    insulin glargine 100 UNIT/ML injection Commonly known as: LANTUS   insulin pump Soln   NovoLOG 100 UNIT/ML injection Generic drug: insulin aspart Replaced by: insulin aspart 100 UNIT/ML injection You also have another medication with the same name that you need to continue taking as instructed.   predniSONE 50 MG tablet Commonly known as: DELTASONE       TAKE these medications    acetaminophen 325 MG tablet Commonly known as: TYLENOL Take 1-2 tablets (325-650  mg total) by mouth every 6 (six) hours as needed for mild pain or moderate pain.   atorvastatin 20 MG tablet Commonly known as: LIPITOR Take 1 tablet (20 mg total) by mouth daily at 6 PM.   citalopram 20 MG  tablet Commonly known as: CELEXA Take 20 mg by mouth daily.   eltrombopag 25 MG tablet Commonly known as: PROMACTA Take 25 mg by mouth daily.   insulin aspart 100 UNIT/ML injection Commonly known as: novoLOG Inject 0-15 Units into the skin 3 (three) times daily with meals. What changed:  how much to take how to take this when to take this additional instructions Another medication with the same name was removed. Continue taking this medication, and follow the directions you see here.   insulin aspart 100 UNIT/ML injection Commonly known as: novoLOG Inject 5 Units into the skin 3 (three) times daily with meals. Hold if patient takes less than 50% of the meals What changed: You were already taking a medication with the same name, and this prescription was added. Make sure you understand how and when to take each. Replaces: NovoLOG 100 UNIT/ML injection   insulin glargine-yfgn 100 UNIT/ML injection Commonly known as: SEMGLEE Inject 0.2 mLs (20 Units total) into the skin daily.   insulin glargine-yfgn 100 UNIT/ML injection Commonly known as: SEMGLEE Inject 0.05 mLs (5 Units total) into the skin at bedtime.   Insulin Syringes (Disposable) U-100 0.3 ML Misc 100 each by Does not apply route daily.   levothyroxine 100 MCG tablet Commonly known as: SYNTHROID Take 100 mcg by mouth daily before breakfast.   lisinopril 20 MG tablet Commonly known as: ZESTRIL Take 1 tablet (20 mg total) by mouth daily. What changed: how much to take   multivitamin with minerals Tabs tablet Take 1 tablet by mouth daily.   nicotine 14 mg/24hr patch Commonly known as: NICODERM CQ - dosed in mg/24 hours Place 1 patch (14 mg total) onto the skin daily.   polyethylene glycol 17 g packet Commonly known as: MIRALAX / GLYCOLAX Take 17 g by mouth daily as needed for moderate constipation.        Contact information for follow-up providers     Marguarite Arbour, MD. Schedule an appointment as soon  as possible for a visit in 1 week(s).   Specialty: Internal Medicine Contact information: [facility] [address] [place] [address] [phone]              Contact information for after-discharge care     Destination     [facility], [facility] .   Service: Skilled Nursing Contact information: [address] [place] [address] [phone]                    Allergies  Allergen Reactions   Darvon [Propoxyphene] Nausea And Vomiting   Sulfa Antibiotics Nausea And Vomiting    Consultations: PCCM   Procedures/Studies: DG Abd Portable 1V  Result Date: 04/12/2023 CLINICAL DATA:  Feeding tube placement. EXAM: PORTABLE ABDOMEN - 1 VIEW COMPARISON:  None Available. FINDINGS: An enteric tube terminates in the left upper abdomen at the expected level of the gastric body, and the side port is also well below the diaphragm. No grossly dilated loops of bowel are evident in the included portion of the abdomen. Aortic atherosclerosis, prominent mitral annular calcifications, and a pacemaker are noted. IMPRESSION: Enteric tube in the stomach. Electronically Signed   By: Jolaine Click.D.  On: 04/12/2023 12:10   ECHOCARDIOGRAM LIMITED  Result Date: 04/12/2023    ECHOCARDIOGRAM LIMITED REPORT   Patient Name:   TAMIEKA REEB Date of Exam: 04/12/2023 Medical Rec #:  [id number]        Height:       62.0 in Accession #:    [id number]       Weight:       126.1 lb Date of Birth:  [birth year]         BSA:          1.571 m Patient Age:    88 years         BP:           117/58 mmHg Patient Gender: F                HR:           83 bpm. Exam Location:  Inpatient Procedure: Limited Echo Indications:    Dyspnea R06.00  History:        Patient has prior history of Echocardiogram examinations, most                 recent 12/25/2021. CHF, CAD, Arrythmias:CHB,                 Signs/Symptoms:Hypotension; Risk Factors:Current Smoker,                  Dyslipidemia and Diabetes.  Sonographer:    Aron Baba Referring Phys: [phone] WHITNEY D HARRIS  Sonographer Comments: Echo performed with patient supine and on artificial respirator. IMPRESSIONS  1. Limited echo ordered by primary service.  2. Left ventricular ejection fraction, by estimation, is 60 to 65%. The left ventricle has normal function. Left ventricular endocardial border not optimally defined to evaluate regional wall motion, wall motion appears hyperdynamic in all segments except the lateral wall, which has less vigorous contraction. There is mild left ventricular hypertrophy. Left ventricular diastolic parameters are indeterminate due to severe mitral annular calcifications.  3. Right ventricular systolic function is normal. The right ventricular size is mildly enlarged. Tricuspid regurgitation signal is inadequate for assessing PA pressure.  4. The mitral valve is degenerative. Mild mitral valve regurgitation. Mild mitral stenosis. The mean mitral valve gradient is 5.5 mmHg with average heart rate of 85 bpm. Severe mitral annular calcification.  5. The aortic valve was not assessed. Aortic valve regurgitation is trivial. Aortic valve sclerosis/calcification is present, without any evidence of aortic stenosis.  6. The inferior vena cava is normal in size with <50% respiratory variability, suggesting right atrial pressure of 8 mmHg.  7. Right atrial size was mildly dilated. FINDINGS  Left Ventricle: Left ventricular ejection fraction, by estimation, is 60 to 65%. The left ventricle has normal function. Left ventricular endocardial border not optimally defined to evaluate regional wall motion. There is mild left ventricular hypertrophy. Left ventricular diastolic parameters are indeterminate. Right Ventricle: The right ventricular size is mildly enlarged. Right vetricular wall thickness was not well visualized. Right ventricular systolic function is normal. Tricuspid regurgitation signal is  inadequate for assessing PA pressure. Left Atrium: Left atrial size was normal in size. Right Atrium: Right atrial size was mildly dilated. Pericardium: There is no evidence of pericardial effusion. Mitral Valve: The mitral valve is degenerative in appearance. Severe mitral annular calcification. Mild mitral valve regurgitation. Mild mitral valve stenosis. The mean mitral valve gradient is 5.5 mmHg with average heart rate of 85 bpm. Tricuspid Valve: The tricuspid valve is grossly  normal. Tricuspid valve regurgitation is trivial. Aortic Valve: The aortic valve was not assessed. Aortic valve regurgitation is trivial. Aortic valve sclerosis/calcification is present, without any evidence of aortic stenosis. Aortic valve mean gradient measures 7.6 mmHg. Aortic valve peak gradient measures 13.4 mmHg. Aortic valve area, by VTI measures 2.01 cm. Pulmonic Valve: The pulmonic valve was not assessed. Aorta: The aortic root is normal in size and structure. Venous: IVC assessment for right atrial pressure unable to be performed due to mechanical ventilation. The inferior vena cava is normal in size with less than 50% respiratory variability, suggesting right atrial pressure of 8 mmHg. IAS/Shunts: The interatrial septum was not assessed. Additional Comments: Spectral Doppler performed. Color Doppler performed.  LEFT VENTRICLE PLAX 2D LVIDd:         3.40 cm LVIDs:         2.30 cm LV PW:         1.10 cm LV IVS:        0.90 cm LVOT diam:     2.12 cm LV SV:         67 LV SV Index:   42 LVOT Area:     3.54 cm  LEFT ATRIUM         Index LA diam:    3.30 cm 2.10 cm/m  AORTIC VALVE AV Area (Vmax):    1.61 cm AV Area (Vmean):   1.58 cm AV Area (VTI):     2.01 cm AV Vmax:           182.87 cm/s AV Vmean:          130.656 cm/s AV VTI:            0.331 m AV Peak Grad:      13.4 mmHg AV Mean Grad:      7.6 mmHg LVOT Vmax:         82.96 cm/s LVOT Vmean:        58.337 cm/s LVOT VTI:          0.188 m LVOT/AV VTI ratio: 0.57 MITRAL VALVE MV  Area (PHT): 4.06 cm     SHUNTS MV Mean grad:  5.5 mmHg     Systemic VTI:  0.19 m MV Decel Time: 187 msec     Systemic Diam: 2.12 cm MV E velocity: 122.00 cm/s MV A velocity: 180.00 cm/s MV E/A ratio:  0.68 Weston Brass MD Electronically signed by Weston Brass MD Signature Date/Time: 04/12/2023/10:12:42 AM    Final    DG CHEST PORT 1 VIEW  Result Date: 04/12/2023 CLINICAL DATA:  Persistent low-grade fevers.  Edema. EXAM: PORTABLE CHEST 1 VIEW COMPARISON:  None Available. FINDINGS: The heart size and mediastinal contours are stable. There is atherosclerotic calcification of the aorta. Pulmonary vasculature is mildly distended. Interstitial prominence is present bilaterally. No consolidation, effusion, or pneumothorax. A dual lead pacemaker is present over the left chest. The endotracheal tube terminates 1 cm above the carina and should be retracted a proximally 2.5 cm. An enteric tube courses over the stomach and out of the field of view. IMPRESSION: 1. Mildly distended pulmonary vasculature with interstitial prominence, possible edema or infection. 2. Support apparatus as described above. Electronically Signed   By: Thornell Sartorius M.D.   On: 04/12/2023 03:25   CT Head Wo Contrast  Result Date: 04/11/2023 CLINICAL DATA:  Altered mental status EXAM: CT HEAD WITHOUT CONTRAST TECHNIQUE: Contiguous axial images were obtained from the base of the skull through the vertex without intravenous contrast.  RADIATION DOSE REDUCTION: This exam was performed according to the departmental dose-optimization program which includes automated exposure control, adjustment of the mA and/or kV according to patient size and/or use of iterative reconstruction technique. COMPARISON:  04/16/2022 FINDINGS: Brain: No evidence of acute infarction, hemorrhage, hydrocephalus, extra-axial collection or mass lesion/mass effect. Periventricular and deep white matter hypodensity. Vascular: No hyperdense vessel or unexpected calcification.  Skull: Normal. Negative for fracture or focal lesion. Sinuses/Orbits: No acute finding. Other: None. IMPRESSION: No acute intracranial pathology. Small-vessel white matter disease. Electronically Signed   By: Jearld Lesch M.D.   On: 04/11/2023 13:35   DG Chest Portable 1 View  Result Date: 04/11/2023 CLINICAL DATA:  Intubation EXAM: PORTABLE CHEST 1 VIEW COMPARISON:  03/20/2023 FINDINGS: Interval placement of endotracheal tube, tip just below the thoracic inlet. Interval placement of esophagogastric tube, tip and side port below the diaphragm. Mild cardiomegaly. Left chest multi lead pacer. Diffuse bilateral interstitial pulmonary opacity. Osseous structures unremarkable. IMPRESSION: 1. Interval placement of endotracheal tube, tip just below the thoracic inlet. 2. Interval placement of esophagogastric tube, tip and side port below the diaphragm. 3. Diffuse bilateral interstitial pulmonary opacity, consistent with edema or infection. Electronically Signed   By: Jearld Lesch M.D.   On: 04/11/2023 13:14   DG Chest 2 View  Result Date: 03/20/2023 CLINICAL DATA:  Cough and fever EXAM: CHEST - 2 VIEW COMPARISON:  12/15/2021 FINDINGS: Stable cardiomediastinal silhouette. Aortic atherosclerotic calcification. Left chest wall pacemaker. Chronic bronchitic changes. No focal consolidation, pleural effusion, or pneumothorax. Biapical pleural-parenchymal scarring. Bibasilar atelectasis or scarring. IMPRESSION: No active cardiopulmonary disease. Electronically Signed   By: Minerva Fester M.D.   On: 03/20/2023 19:34      Subjective: Patient seen and examined at bedside today.  Hemodynamically stable.  Comfortable.  Sitting on the chair.  Medically stable for discharge.I called and discussed with daughter about discharge planning  Discharge Exam: Vitals:   04/19/23 0446 04/19/23 0800  BP: 127/68 (!) 149/55  Pulse: 76 66  Resp: 13 18  Temp: 98.8 F (37.1 C) 98.8 F (37.1 C)  SpO2: 96% 94%   Vitals:    04/18/23 1943 04/18/23 2324 04/19/23 0446 04/19/23 0800  BP: (!) 149/67 (!) 146/62 127/68 (!) 149/55  Pulse: 78 82 76 66  Resp: 18 19 13 18   Temp: 98.8 F (37.1 C) 99.2 F (37.3 C) 98.8 F (37.1 C) 98.8 F (37.1 C)  TempSrc: Oral Oral Oral Oral  SpO2: 98% 97% 96% 94%  Weight:   54.8 kg     General: Pt is alert, awake, not in acute distress Cardiovascular: RRR, S1/S2 +, no rubs, no gallops Respiratory: CTA bilaterally, no wheezing, no rhonchi Abdominal: Soft, NT, ND, bowel sounds + Extremities: no edema, no cyanosis    The results of significant diagnostics from this hospitalization (including imaging, microbiology, ancillary and laboratory) are listed below for reference.     Microbiology: Recent Results (from the past 240 hour(s))  Blood Culture (routine x 2)     Status: None   Collection Time: 04/11/23  1:06 PM   Specimen: BLOOD RIGHT FOREARM  Result Value Ref Range Status   Specimen Description BLOOD RIGHT FOREARM  Final   Special Requests   Final    BOTTLES DRAWN AEROBIC AND ANAEROBIC Blood Culture adequate volume   Culture   Final    NO GROWTH 5 DAYS Performed at [facility], [address], [place], [place] [address]    Report Status 04/16/2023 FINAL  Final  Blood Culture (routine x 2)     Status: None   Collection Time: 04/11/23  1:06 PM   Specimen: BLOOD  Result Value Ref Range Status   Specimen Description BLOOD RIGHT ANTECUBITAL  Final   Special Requests   Final    BOTTLES DRAWN AEROBIC AND ANAEROBIC Blood Culture results may not be optimal due to an inadequate volume of blood received in culture bottles   Culture   Final    NO GROWTH 5 DAYS Performed at [facility], [address], [place], [place] [address]    Report Status 04/16/2023 FINAL  Final  MRSA Next Gen by PCR, Nasal     Status: None   Collection Time: 04/11/23  6:13 PM   Specimen: Nasal Mucosa; Nasal Swab  Result Value Ref Range Status   MRSA by PCR Next Gen NOT DETECTED NOT  DETECTED Final    Comment: (NOTE) The GeneXpert MRSA Assay (FDA approved for NASAL specimens only), is one component of a comprehensive MRSA colonization surveillance program. It is not intended to diagnose MRSA infection nor to guide or monitor treatment for MRSA infections. Test performance is not FDA approved in patients less than 17 years old. Performed at [facility], [address], [place], [place] [address]   Culture, blood (Routine X 2) w Reflex to ID Panel     Status: None   Collection Time: 04/12/23  4:09 AM   Specimen: BLOOD LEFT HAND  Result Value Ref Range Status   Specimen Description BLOOD LEFT HAND  Final   Special Requests   Final    BOTTLES DRAWN AEROBIC AND ANAEROBIC Blood Culture adequate volume   Culture   Final    NO GROWTH 5 DAYS Performed at [facility], [address], [place], [place] [address]    Report Status 04/17/2023 FINAL  Final  Culture, blood (Routine X 2) w Reflex to ID Panel     Status: None   Collection Time: 04/12/23  4:09 AM   Specimen: BLOOD LEFT HAND  Result Value Ref Range Status   Specimen Description BLOOD LEFT HAND  Final   Special Requests AEROBIC BOTTLE ONLY Blood Culture adequate volume  Final   Culture   Final    NO GROWTH 5 DAYS Performed at [facility], [address] [place], [place] [address]    Report Status 04/17/2023 FINAL  Final     Labs: BNP (last 3 results) Recent Labs    10/04/22 0611 04/11/23 1258  BNP 168.5* 189.7*   Basic Metabolic Panel: Recent Labs  Lab 04/12/23 1718 04/13/23 0613 04/14/23 0845 04/15/23 0054 04/18/23 0058  NA  --  138 141 140 135  K  --  3.9 3.7 3.6 4.3  CL  --  100 102 102 100  CO2  --  26 28 26 26   GLUCOSE  --  197* 186* 271* 311*  BUN  --  11 14 16 20   CREATININE  --  0.61 0.79 0.82 0.81  CALCIUM  --  7.9* 9.1 8.9 8.1*  MG 2.3 2.0  --   --   --   PHOS 3.5 2.6  --   --   --    Liver Function Tests: No results for input(s): "AST", "ALT", "ALKPHOS",  "BILITOT", "PROT", "ALBUMIN" in the last 168 hours.  No results for input(s): "LIPASE", "AMYLASE" in the last 168 hours. No results for input(s): "AMMONIA" in the last 168 hours.  CBC:  Recent Labs  Lab 04/13/23 0613 04/14/23 0845 04/15/23 0054 04/18/23 0058  WBC 9.0 8.0 8.0 8.2  HGB 10.1* 10.5* 10.7* 11.1*  HCT 31.2* 32.3* 32.4* 33.9*  MCV 91.8 89.5 91.8 90.9  PLT 227 242 224 217   Cardiac Enzymes: No results for input(s): "CKTOTAL", "CKMB", "CKMBINDEX", "TROPONINI" in the last 168 hours. BNP: Invalid input(s): "POCBNP" CBG: Recent Labs  Lab 04/18/23 1146 04/18/23 1551 04/18/23 2105 04/19/23 0603 04/19/23 0845  GLUCAP 163* 179* 287* 189* 140*   D-Dimer No results for input(s): "DDIMER" in the last 72 hours. Hgb A1c No results for input(s): "HGBA1C" in the last 72 hours. Lipid Profile No results for input(s): "CHOL", "HDL", "LDLCALC", "TRIG", "CHOLHDL", "LDLDIRECT" in the last 72 hours. Thyroid function studies No results for input(s): "TSH", "T4TOTAL", "T3FREE", "THYROIDAB" in the last 72 hours.  Invalid input(s): "FREET3" Anemia work up No results for input(s): "VITAMINB12", "FOLATE", "FERRITIN", "TIBC", "IRON", "RETICCTPCT" in the last 72 hours. Urinalysis    Component Value Date/Time   COLORURINE YELLOW 04/11/2023 1258   APPEARANCEUR CLEAR 04/11/2023 1258   LABSPEC 1.014 04/11/2023 1258   PHURINE 7.0 04/11/2023 1258   GLUCOSEU >=500 (A) 04/11/2023 1258   HGBUR NEGATIVE 04/11/2023 1258   BILIRUBINUR NEGATIVE 04/11/2023 1258   KETONESUR NEGATIVE 04/11/2023 1258   PROTEINUR NEGATIVE 04/11/2023 1258   NITRITE NEGATIVE 04/11/2023 1258   LEUKOCYTESUR NEGATIVE 04/11/2023 1258   Sepsis Labs Recent Labs  Lab 04/13/23 0613 04/14/23 0845 04/15/23 0054 04/18/23 0058  WBC 9.0 8.0 8.0 8.2   Microbiology Recent Results (from the past 240 hour(s))  Blood Culture (routine x 2)     Status: None   Collection Time: 04/11/23  1:06 PM   Specimen: BLOOD RIGHT  FOREARM  Result Value Ref Range Status   Specimen Description BLOOD RIGHT FOREARM  Final   Special Requests   Final    BOTTLES DRAWN AEROBIC AND ANAEROBIC Blood Culture adequate volume   Culture   Final    NO GROWTH 5 DAYS Performed at [facility], [address] [place], [place] [address]    Report Status 04/16/2023 FINAL  Final  Blood Culture (routine x 2)     Status: None   Collection Time: 04/11/23  1:06 PM   Specimen: BLOOD  Result Value Ref Range Status   Specimen Description BLOOD RIGHT ANTECUBITAL  Final   Special Requests   Final    BOTTLES DRAWN AEROBIC AND ANAEROBIC Blood Culture results may not be optimal due to an inadequate volume of blood received in culture bottles   Culture   Final    NO GROWTH 5 DAYS Performed at [facility], [address], [place], [place] [address]    Report Status 04/16/2023 FINAL  Final  MRSA Next Gen by PCR, Nasal     Status: None   Collection Time: 04/11/23  6:13 PM   Specimen: Nasal Mucosa; Nasal Swab  Result Value Ref Range Status   MRSA by PCR Next Gen NOT DETECTED NOT DETECTED Final    Comment: (NOTE) The GeneXpert MRSA Assay (FDA approved for NASAL specimens only), is one component of a comprehensive MRSA colonization surveillance program. It is not intended to diagnose MRSA infection nor to guide or monitor treatment for MRSA infections. Test performance is not FDA approved in patients less than 35 years old. Performed at [facility], [address], [place], [place] [address]   Culture, blood (Routine X 2) w Reflex to ID Panel  Status: None   Collection Time: 04/12/23  4:09 AM   Specimen: BLOOD LEFT HAND  Result Value Ref Range Status   Specimen Description BLOOD LEFT HAND  Final   Special Requests   Final    BOTTLES DRAWN AEROBIC AND ANAEROBIC Blood Culture adequate volume   Culture   Final    NO GROWTH 5 DAYS Performed at [facility], [address], [place], [place] [address]    Report  Status 04/17/2023 FINAL  Final  Culture, blood (Routine X 2) w Reflex to ID Panel     Status: None   Collection Time: 04/12/23  4:09 AM   Specimen: BLOOD LEFT HAND  Result Value Ref Range Status   Specimen Description BLOOD LEFT HAND  Final   Special Requests AEROBIC BOTTLE ONLY Blood Culture adequate volume  Final   Culture   Final    NO GROWTH 5 DAYS Performed at [facility], [address], [place], [place] [address]    Report Status 04/17/2023 FINAL  Final    Please note: You were cared for by a hospitalist during your hospital stay. Once you are discharged, your primary care physician will handle any further medical issues. Please note that NO REFILLS for any discharge medications will be authorized once you are discharged, as it is imperative that you return to your primary care physician (or establish a relationship with a primary care physician if you do not have one) for your post hospital discharge needs so that they can reassess your need for medications and monitor your lab values.    Time coordinating discharge: 40 minutes  SIGNED:   Burnadette Pop, MD  [facility] 04/19/2023, 10:16 AM Pager [phone]  If 7PM-7AM, please contact night-coverage [email or web address] Password [email or web address]

## 2023-04-17 NOTE — TOC Progression Note (Signed)
Transition of Care [facility]) - Progression Note    Patient Details  Name: Madeline [name] MRN: [id number] Date of Birth: [birth year]  Transition of Care [facility]) CM/SW Contact  Leander Rams, LCSW Phone Number: 04/17/2023, 11:10 AM  Clinical Narrative:    CSW provided daughter with SNF bed offers. Daughter will review. Daughter states she will inform CSW of SNF decision by noon.   TOC will continue to follow.    Expected Discharge Plan: Skilled Nursing Facility Barriers to Discharge: Continued Medical Work up  Expected Discharge Plan and Services     Post Acute Care Choice: Skilled Nursing Facility Living arrangements for the past 2 months: Skilled Nursing Facility                                       Social Determinants of Health (SDOH) Interventions SDOH Screenings   Food Insecurity: No Food Insecurity (09/02/2020)  Transportation Needs: No Transportation Needs (09/02/2020)  Depression (PHQ2-9): Low Risk  (09/02/2020)  Tobacco Use: High Risk (04/11/2023)    Readmission Risk Interventions    12/16/2021   10:51 AM  Readmission Risk Prevention Plan  Transportation Screening Complete  PCP or Specialist Appt within 5-7 Days Complete  Home Care Screening Complete  Medication Review (RN CM) Complete   Oletta Lamas, MSW, LCSWA, LCASA Transitions of Care  Clinical Social Worker I

## 2023-04-18 DIAGNOSIS — E162 Hypoglycemia, unspecified: Secondary | ICD-10-CM | POA: Diagnosis not present

## 2023-04-18 LAB — BASIC METABOLIC PANEL
Anion gap: 9 (ref 5–15)
BUN: 20 mg/dL (ref 8–23)
CO2: 26 mmol/L (ref 22–32)
Calcium: 8.1 mg/dL — ABNORMAL LOW (ref 8.9–10.3)
Chloride: 100 mmol/L (ref 98–111)
Creatinine, Ser: 0.81 mg/dL (ref 0.44–1.00)
GFR, Estimated: >60 mL/min (ref >60)
Glucose, Bld: 311 mg/dL — ABNORMAL HIGH (ref 70–99)
Potassium: 4.3 mmol/L (ref 3.5–5.1)
Sodium: 135 mmol/L (ref 135–145)

## 2023-04-18 LAB — CBC
HCT: 33.9 % — ABNORMAL LOW (ref 36.0–46.0)
Hemoglobin: 11.1 g/dL — ABNORMAL LOW (ref 12.0–15.0)
MCH: 29.8 pg (ref 26.0–34.0)
MCHC: 32.7 g/dL (ref 30.0–36.0)
MCV: 90.9 fL (ref 80.0–100.0)
Platelets: 217 10*3/uL (ref 150–400)
RBC: 3.73 MIL/uL — ABNORMAL LOW (ref 3.87–5.11)
RDW: 13.6 % (ref 11.5–15.5)
WBC: 8.2 10*3/uL (ref 4.0–10.5)
nRBC: 0 % (ref 0.0–0.2)

## 2023-04-18 LAB — GLUCOSE, CAPILLARY
Glucose-Capillary: 378 mg/dL — ABNORMAL HIGH (ref 70–99)
Glucose-Capillary: 282 mg/dL — ABNORMAL HIGH (ref 70–99)
Glucose-Capillary: 163 mg/dL — ABNORMAL HIGH (ref 70–99)
Glucose-Capillary: 179 mg/dL — ABNORMAL HIGH (ref 70–99)
Glucose-Capillary: 287 mg/dL — ABNORMAL HIGH (ref 70–99)

## 2023-04-18 MED ORDER — INSULIN GLARGINE-YFGN 100 UNIT/ML ~~LOC~~ SOLN: 5.0000 [IU] | Freq: Every day | SUBCUTANEOUS | 11 refills | Status: DC

## 2023-04-18 MED ORDER — INSULIN GLARGINE-YFGN 100 UNIT/ML ~~LOC~~ SOLN
5.0000 [IU] | Freq: Every day | SUBCUTANEOUS | Status: DC
  Administered 2023-04-18: 5 [IU] via SUBCUTANEOUS
  Filled 2023-04-18 (×2): qty 0.05

## 2023-04-18 NOTE — Progress Notes (Signed)
Mobility Specialist Progress Note    04/18/23 1019  Mobility  Activity Ambulated with assistance in hallway  Level of Assistance Contact guard assist, steadying assist  Assistive Device Four wheel walker  Distance Ambulated (ft) 300 ft  Activity Response Tolerated well  Mobility Referral Yes  $Mobility charge 1 Mobility  Mobility Specialist Start Time (ACUTE ONLY) 1000  Mobility Specialist Stop Time (ACUTE ONLY) 1018  Mobility Specialist Time Calculation (min) (ACUTE ONLY) 18 min   Pre-Mobility: 72 HR Post-Mobility: 78 HR  Pt received in chair and agreeable. No complaints on walk. Pt took x1 seated rest break after ~150'. Returned to chair with call bell in reach and chair alarm on.   Trilby Nation Mobility Specialist  Please Neurosurgeon or Rehab Office at [phone]

## 2023-04-18 NOTE — Progress Notes (Signed)
PROGRESS NOTE  Madeline [name]  ZOX:[id number] DOB: [birth year] DOA: 04/11/2023 PCP: Marguarite Arbour, MD   Brief Narrative:  Patient is a 89 year old female with a past medical history of diabetes type 2 on insulin pump at home, coronary artery disease, hyperlipidemia, GERD, hypothyroidism, status post dual-chamber pacemaker who presents to the emergency department with severe hypoglycemia, encephalopathy.  Blood sugar was 39 on presentation.  Patient different encephalopathic even after correction of blood sugar and patient had to be intubated for airway protection.  Extubated on 5/27.  CXR on presentation also showed diffuse bilateral interstitial pulmonary opacity, consistent with edema or infection,started on antibiotics.  Currently hemodynamically stable, maintaining saturation on room air.  PT/OT consulted, recommended SNF on discharge.  TOC consulted.  Medically stable for discharge whenever possible     Assessment & Plan:  Principal Problem:   Hypoglycemia   Acute metabolic encephalopathy in the setting of recurrent hypoglycemia: Mental status has improved and currently near baseline.   This morning she was alert and mostly oriented,hard of hearing.   Poorly controlled diabetes type 2: A1c of 9.2.  On insulin pump at home.  Presented with hypoglycemia.   Follows with endocrinology, Dr. Tedd Sias.  Diabetic coordinator following.   Started on long-acting and sliding scale insulin ,also meal coverage.  Diabetic coordinator recommending Lantus and NovoLog on discharge.  Continue current insulin regimen and adjust accordingly   Suspected community-acquired pneumonia versus aspiration pneumonia:   Maintaining saturation on room air this morning. CXR on presentation also showed diffuse bilateral interstitial pulmonary opacity, consistent with edema or infection,started on antibiotics,completed course. Echo showed normal EF.   Hypertension: Restarted her home lisinopril.   Hyperlipidemia: On  Lipitor   GERD: Protonix   Tobacco dependence: Counseled cessation.  Continue nicotine patch   AKI: Resolved   Hyponatremia/hypokalemia: Stable now   Weakness: PT/OT recommended SNF on dc.  Patient lives with her husband .     Nutrition Problem: Inadequate oral intake Etiology: inability to eat    DVT prophylaxis:heparin injection 5,000 Units Start: 04/11/23 1515 SCDs Start: 04/11/23 1507     Code Status: Full Code  Family Communication: Discussed with daughter at bedside on 6/1  Patient status:Inpatient  Patient is from :Home  Anticipated discharge to:SNF  Estimated DC date:tomorrow   Consultants: PCCM  Procedures:None  Antimicrobials:  Anti-infectives (From admission, onward)    Start     Dose/Rate Route Frequency Ordered Stop   04/13/23 0600  vancomycin (VANCOREADY) IVPB 750 mg/150 mL  Status:  Discontinued        750 mg 150 mL/hr over 60 Minutes Intravenous Every 24 hours 04/12/23 0352 04/12/23 1041   04/12/23 1130  azithromycin (ZITHROMAX) 500 mg in sodium chloride 0.9 % 250 mL IVPB        500 mg 250 mL/hr over 60 Minutes Intravenous Every 24 hours 04/12/23 1042 04/14/23 1216   04/12/23 0400  cefTRIAXone (ROCEPHIN) 2 g in sodium chloride 0.9 % 100 mL IVPB        2 g 200 mL/hr over 30 Minutes Intravenous Every 24 hours 04/11/23 1833 04/16/23 0555   04/12/23 0400  vancomycin (VANCOREADY) IVPB 1250 mg/250 mL        1,250 mg 166.7 mL/hr over 90 Minutes Intravenous  Once 04/12/23 0302 04/12/23 0618   04/11/23 1730  cefTRIAXone (ROCEPHIN) 1 g in sodium chloride 0.9 % 100 mL IVPB  Status:  Discontinued        1 g 200 mL/hr over 30 Minutes Intravenous  Every 24 hours 04/11/23 1643 04/11/23 1833   04/11/23 1445  ceFEPIme (MAXIPIME) 2 g in sodium chloride 0.9 % 100 mL IVPB  Status:  Discontinued        2 g 200 mL/hr over 30 Minutes Intravenous  Once 04/11/23 1443 04/11/23 1646   04/11/23 1445  metroNIDAZOLE (FLAGYL) IVPB 500 mg        500 mg 100 mL/hr over 60  Minutes Intravenous  Once 04/11/23 1443 04/11/23 1555   04/11/23 1445  vancomycin (VANCOCIN) IVPB 1000 mg/200 mL premix  Status:  Discontinued        1,000 mg 200 mL/hr over 60 Minutes Intravenous  Once 04/11/23 1443 04/11/23 1813       Subjective: Patient seen and examined at bedside today.  Hemodynamically stable.  Sitting in chair.  No new complaints  Objective: Vitals:   04/17/23 2305 04/18/23 0400 04/18/23 0600 04/18/23 0737  BP: 134/68 (!) 136/53  (!) 155/74  Pulse: 83 70  73  Resp:  18  17  Temp: 98.1 F (36.7 C) 97.9 F (36.6 C)  97.9 F (36.6 C)  TempSrc: Oral Axillary  Oral  SpO2: 95% 92%  92%  Weight:   55.2 kg     Intake/Output Summary (Last 24 hours) at 04/18/2023 1018 Last data filed at 04/18/2023 1610 Gross per 24 hour  Intake 720 ml  Output 2250 ml  Net -1530 ml   Filed Weights   04/16/23 0559 04/17/23 0500 04/18/23 0600  Weight: 54.3 kg 56 kg 55.2 kg    Examination:   General exam: Overall comfortable, not in distress,pleasant elderly female HEENT: PERRL Respiratory system:  no wheezes or crackles  Cardiovascular system: S1 & S2 heard, RRR.  Gastrointestinal system: Abdomen is nondistended, soft and nontender. Central nervous system: Alert and oriented Extremities: No edema, no clubbing ,no cyanosis Skin: No rashes, no ulcers,no icterus    Data Reviewed: I have personally reviewed following labs and imaging studies  CBC: Recent Labs  Lab 04/11/23 1242 04/11/23 1310 04/12/23 0434 04/13/23 0613 04/14/23 0845 04/15/23 0054 04/18/23 0058  WBC 12.8*  --  10.5 9.0 8.0 8.0 8.2  NEUTROABS 10.3*  --  7.9*  --   --   --   --   HGB 11.8*   < > 10.9* 10.1* 10.5* 10.7* 11.1*  HCT 36.6   < > 34.1* 31.2* 32.3* 32.4* 33.9*  MCV 90.8  --  92.4 91.8 89.5 91.8 90.9  PLT 295  --  232 227 242 224 217   < > = values in this interval not displayed.   Basic Metabolic Panel: Recent Labs  Lab 04/11/23 1242 04/11/23 1310 04/11/23 1705 04/12/23 0434  04/12/23 1718 04/13/23 9604 04/14/23 0845 04/15/23 0054 04/18/23 0058  NA 138   < >  --  132*  --  138 141 140 135  K 3.4*   < > 5.3* 4.7  --  3.9 3.7 3.6 4.3  CL 101  --   --  99  --  100 102 102 100  CO2 26  --   --  25  --  26 28 26 26   GLUCOSE 137*  --   --  178*  --  197* 186* 271* 311*  BUN 17  --   --  26*  --  11 14 16 20   CREATININE 0.86  --   --  1.09*  --  0.61 0.79 0.82 0.81  CALCIUM 8.7*  --   --  8.0*  --  7.9* 9.1 8.9 8.1*  MG 2.8*  --  2.3 2.4 2.3 2.0  --   --   --   PHOS  --   --  4.6 4.7* 3.5 2.6  --   --   --    < > = values in this interval not displayed.     Recent Results (from the past 240 hour(s))  Blood Culture (routine x 2)     Status: None   Collection Time: 04/11/23  1:06 PM   Specimen: BLOOD RIGHT FOREARM  Result Value Ref Range Status   Specimen Description BLOOD RIGHT FOREARM  Final   Special Requests   Final    BOTTLES DRAWN AEROBIC AND ANAEROBIC Blood Culture adequate volume   Culture   Final    NO GROWTH 5 DAYS Performed at [facility], [address] [place], [place] [address]    Report Status 04/16/2023 FINAL  Final  Blood Culture (routine x 2)     Status: None   Collection Time: 04/11/23  1:06 PM   Specimen: BLOOD  Result Value Ref Range Status   Specimen Description BLOOD RIGHT ANTECUBITAL  Final   Special Requests   Final    BOTTLES DRAWN AEROBIC AND ANAEROBIC Blood Culture results may not be optimal due to an inadequate volume of blood received in culture bottles   Culture   Final    NO GROWTH 5 DAYS Performed at [facility], [address] [place], [place] [address]    Report Status 04/16/2023 FINAL  Final  MRSA Next Gen by PCR, Nasal     Status: None   Collection Time: 04/11/23  6:13 PM   Specimen: Nasal Mucosa; Nasal Swab  Result Value Ref Range Status   MRSA by PCR Next Gen NOT DETECTED NOT DETECTED Final    Comment: (NOTE) The GeneXpert MRSA Assay (FDA approved for NASAL specimens only), is one component  of a comprehensive MRSA colonization surveillance program. It is not intended to diagnose MRSA infection nor to guide or monitor treatment for MRSA infections. Test performance is not FDA approved in patients less than 1 years old. Performed at [facility], [address], [place], [place] [address]   Culture, blood (Routine X 2) w Reflex to ID Panel     Status: None   Collection Time: 04/12/23  4:09 AM   Specimen: BLOOD LEFT HAND  Result Value Ref Range Status   Specimen Description BLOOD LEFT HAND  Final   Special Requests   Final    BOTTLES DRAWN AEROBIC AND ANAEROBIC Blood Culture adequate volume   Culture   Final    NO GROWTH 5 DAYS Performed at [facility], [address], [place], [place] [address]    Report Status 04/17/2023 FINAL  Final  Culture, blood (Routine X 2) w Reflex to ID Panel     Status: None   Collection Time: 04/12/23  4:09 AM   Specimen: BLOOD LEFT HAND  Result Value Ref Range Status   Specimen Description BLOOD LEFT HAND  Final   Special Requests AEROBIC BOTTLE ONLY Blood Culture adequate volume  Final   Culture   Final    NO GROWTH 5 DAYS Performed at [facility], [address] [place], [place] [address]    Report Status 04/17/2023 FINAL  Final     Radiology Studies: No results found.  Scheduled Meds:  Chlorhexidine Gluconate Cloth  6  each Topical Q0600   heparin  5,000 Units Subcutaneous Q8H   insulin aspart  0-15 Units Subcutaneous TID WC   insulin aspart  5 Units Subcutaneous TID WC   insulin glargine-yfgn  20 Units Subcutaneous Daily   lisinopril  20 mg Oral Daily   multivitamin with minerals  1 tablet Oral Daily   nicotine  14 mg Transdermal Daily   Continuous Infusions:     LOS: 7 days   Burnadette Pop, MD [facility] P6/12/2022, 10:18 AM

## 2023-04-18 NOTE — TOC Progression Note (Signed)
Transition of Care [facility]) - Progression Note    Patient Details  Name: Madeline [name] MRN: [id number] Date of Birth: 02/10/1934  Transition of Care [facility]) CM/SW Contact  Brondon Wann A [place], [place] Phone Number: 04/18/2023, 12:39 PM  Clinical Narrative:     CSW was informed that pt was medically stable for DC. CSW contacted [employer] regarding SNF authorization. There was no answer, CSW left voicemail with contact information to reach back out to CSW.     Barriers to DC, insurance authorization.   TOC will continue to follow.  Expected Discharge Plan: Skilled Nursing Facility Barriers to Discharge: Continued Medical Work up  Expected Discharge Plan and Services     Post Acute Care Choice: Skilled Nursing Facility Living arrangements for the past 2 months: Skilled Nursing Facility Expected Discharge Date: 04/17/23                                     Social Determinants of Health (SDOH) Interventions SDOH Screenings   Food Insecurity: No Food Insecurity (09/02/2020)  Transportation Needs: No Transportation Needs (09/02/2020)  Depression (PHQ2-9): Low Risk  (09/02/2020)  Tobacco Use: High Risk (04/11/2023)    Readmission Risk Interventions    12/16/2021   10:51 AM  Readmission Risk Prevention Plan  Transportation Screening Complete  PCP or Specialist Appt within 5-7 Days Complete  Home Care Screening Complete  Medication Review (RN CM) Complete

## 2023-04-18 NOTE — Inpatient Diabetes Management (Signed)
Inpatient Diabetes Program Recommendations  AACE/ADA: New Consensus Statement on Inpatient Glycemic Control (2015)  Target Ranges:  Prepandial:   less than 140 mg/dL      Peak postprandial:   less than 180 mg/dL (1-2 hours)      Critically ill patients:  140 - 180 mg/dL   Lab Results  Component Value Date   GLUCAP 378 (H) 04/18/2023   HGBA1C 9.2 (H) 04/11/2023    Review of Glycemic Control  Diabetes history: DM1(does not make insulin.  Needs correction, basal and meal coverage)   Outpatient Diabetes medications:  Insulin pump-Medtronic/Contour next glucometer and Freestyle Libre 2 Basal settings 12 AM 0.45 units/hr 6 am 0.925 units/hr 8 am 0.6 units/hr 5 pm 0.7 units/hr 24-hr basal = 14.85 units NovoLog   Bolus settings I:C 12AM 15, 11AM 12 Sensitivity 65 Target 100-120 Active insulin time 5 hrs  Current orders for Inpatient glycemic control: Semglee 15 QAM, Novolog 0-20 units TID and 0-5 units QHS, Novolog 3 units TID with meals    Inpatient Diabetes Program Recommendations:     Consider adding Semglee 5 units QHS in addition to other orders.   Thanks, Lujean Rave, MSN, RNC-OB Diabetes Coordinator [phone] (8a-5p)

## 2023-04-19 DIAGNOSIS — M6281 Muscle weakness (generalized): Secondary | ICD-10-CM | POA: Diagnosis not present

## 2023-04-19 DIAGNOSIS — I10 Essential (primary) hypertension: Secondary | ICD-10-CM | POA: Diagnosis not present

## 2023-04-19 DIAGNOSIS — E119 Type 2 diabetes mellitus without complications: Secondary | ICD-10-CM | POA: Diagnosis not present

## 2023-04-19 DIAGNOSIS — K219 Gastro-esophageal reflux disease without esophagitis: Secondary | ICD-10-CM | POA: Diagnosis not present

## 2023-04-19 DIAGNOSIS — E785 Hyperlipidemia, unspecified: Secondary | ICD-10-CM | POA: Diagnosis not present

## 2023-04-19 DIAGNOSIS — R2689 Other abnormalities of gait and mobility: Secondary | ICD-10-CM | POA: Diagnosis not present

## 2023-04-19 DIAGNOSIS — R41841 Cognitive communication deficit: Secondary | ICD-10-CM | POA: Diagnosis not present

## 2023-04-19 DIAGNOSIS — E1165 Type 2 diabetes mellitus with hyperglycemia: Secondary | ICD-10-CM | POA: Diagnosis not present

## 2023-04-19 DIAGNOSIS — G9341 Metabolic encephalopathy: Secondary | ICD-10-CM | POA: Diagnosis not present

## 2023-04-19 DIAGNOSIS — E8729 Other acidosis: Secondary | ICD-10-CM | POA: Diagnosis not present

## 2023-04-19 DIAGNOSIS — R1310 Dysphagia, unspecified: Secondary | ICD-10-CM | POA: Diagnosis not present

## 2023-04-19 DIAGNOSIS — R5381 Other malaise: Secondary | ICD-10-CM | POA: Diagnosis not present

## 2023-04-19 DIAGNOSIS — E440 Moderate protein-calorie malnutrition: Secondary | ICD-10-CM | POA: Diagnosis not present

## 2023-04-19 DIAGNOSIS — F17200 Nicotine dependence, unspecified, uncomplicated: Secondary | ICD-10-CM | POA: Diagnosis not present

## 2023-04-19 DIAGNOSIS — Z794 Long term (current) use of insulin: Secondary | ICD-10-CM | POA: Diagnosis not present

## 2023-04-19 DIAGNOSIS — J189 Pneumonia, unspecified organism: Secondary | ICD-10-CM | POA: Diagnosis not present

## 2023-04-19 DIAGNOSIS — I152 Hypertension secondary to endocrine disorders: Secondary | ICD-10-CM | POA: Diagnosis not present

## 2023-04-19 DIAGNOSIS — I5032 Chronic diastolic (congestive) heart failure: Secondary | ICD-10-CM | POA: Diagnosis not present

## 2023-04-19 DIAGNOSIS — D649 Anemia, unspecified: Secondary | ICD-10-CM | POA: Diagnosis not present

## 2023-04-19 DIAGNOSIS — R7309 Other abnormal glucose: Secondary | ICD-10-CM | POA: Diagnosis not present

## 2023-04-19 DIAGNOSIS — E10649 Type 1 diabetes mellitus with hypoglycemia without coma: Secondary | ICD-10-CM | POA: Diagnosis not present

## 2023-04-19 LAB — GLUCOSE, CAPILLARY
Glucose-Capillary: 189 mg/dL — ABNORMAL HIGH (ref 70–99)
Glucose-Capillary: 140 mg/dL — ABNORMAL HIGH (ref 70–99)
Glucose-Capillary: 272 mg/dL — ABNORMAL HIGH (ref 70–99)

## 2023-04-19 NOTE — Progress Notes (Signed)
Patient seen and examined at the bedside today.  Hemodynamically stable.  Comfortably sitting in the chair.  No new complaints.  Blood sugars have been better this morning.  I called the daughter and discussed about discharge planning.  Medically stable for discharge to SNF.  No new change in the medical management

## 2023-04-19 NOTE — Progress Notes (Signed)
Physical Therapy Treatment Patient Details Name: Madeline [name] MRN: [id number] DOB: 11-Sep-1934 Today's Date: 04/19/2023   History of Present Illness Patient presenting to the ED on 04/11/23 with unresponsiveness and hypoglycemia with levels in the 30s. Intubated on 5/26 and extubated 5/27. Patient with episodes of hyperglycemia this hospital stay requiring intervention. PMH includes: CAD, GERD, DM2, hypothyroidism, dual cardiac pacemaker    PT Comments    Pt progressing towards all goals however remains to require assist and RW for safe ambulation. Pt was indep with ADLs and amb PTA. Pt to benefit from rehab program < 3 hrs/day to achieve safe mod I level of function as pt was PTA. Acute PT to continue to follow.    Recommendations for follow up therapy are one component of a multi-disciplinary discharge planning process, led by the attending physician.  Recommendations may be updated based on patient status, additional functional criteria and insurance authorization.  Follow Up Recommendations  Can patient physically be transported by private vehicle: No    Assistance Recommended at Discharge Frequent or constant Supervision/Assistance  Patient can return home with the following Two people to help with walking and/or transfers;A lot of help with bathing/dressing/bathroom;Assistance with cooking/housework;Direct supervision/assist for medications management;Direct supervision/assist for financial management;Assist for transportation;Help with stairs or ramp for entrance   Equipment Recommendations  Other (comment) (Per accepting facility)    Recommendations for Other Services       Precautions / Restrictions Precautions Precautions: Fall Restrictions Weight Bearing Restrictions: No     Mobility  Bed Mobility               General bed mobility comments: pt received in chair    Transfers Overall transfer level: Needs assistance Equipment used: 1 person hand held  assist Transfers: Bed to chair/wheelchair/BSC Sit to Stand: Min assist           General transfer comment: increased time, pushed self up from chair and grabbed PT's L hand    Ambulation/Gait Ambulation/Gait assistance: Min assist, Mod assist Gait Distance (Feet): 150 Feet Assistive device: Rolling walker (2 wheels), None Gait Pattern/deviations: Decreased stride length Gait velocity: reduced Gait velocity interpretation: <1.31 ft/sec, indicative of household ambulator   General Gait Details: attempted to ambulate without AD however pt with increased trunk flexion, short, shurffled steps and leaning into PT. Pt given RW in which pt was able to take increased step height and length, with cues able to stand up straight and stay in walker, VSS, pt more steady with RW   Stairs             Wheelchair Mobility    Modified Rankin (Stroke Patients Only)       Balance Overall balance assessment: Needs assistance Sitting-balance support: Bilateral upper extremity supported, Feet supported Sitting balance-Leahy Scale: Fair Sitting balance - Comments: Able to sit EOB briefly Postural control: Posterior lean Standing balance support: During functional activity, Reliant on assistive device for balance, Bilateral upper extremity supported Standing balance-Leahy Scale: Poor Standing balance comment: Reliant on RW                            Cognition Arousal/Alertness: Awake/alert Behavior During Therapy: WFL for tasks assessed/performed, Impulsive Overall Cognitive Status: Impaired/Different from baseline Area of Impairment: Attention, Memory, Following commands, Safety/judgement, Awareness, Problem solving                 Orientation Level: Time, Situation Current Attention Level: Sustained Memory: Decreased  recall of precautions Following Commands: Follows one step commands with increased time, Follows multi-step commands inconsistently Safety/Judgement:  Decreased awareness of safety, Decreased awareness of deficits Awareness: Emergent Problem Solving: Slow processing, Decreased initiation, Difficulty sequencing, Requires verbal cues, Requires tactile cues General Comments: per dtr pt is typically pretty sharp, pt is HOH and doesn't have her hearing aides in which could contribute to the delayed response time/processing        Exercises      General Comments General comments (skin integrity, edema, etc.): VSS on RA      Pertinent Vitals/Pain Pain Assessment Pain Assessment: No/denies pain    Home Living                          Prior Function            PT Goals (current goals can now be found in the care plan section) Acute Rehab PT Goals PT Goal Formulation: With patient/family Time For Goal Achievement: 04/28/23 Potential to Achieve Goals: Good Progress towards PT goals: Progressing toward goals    Frequency    Min 1X/week      PT Plan Current plan remains appropriate    Co-evaluation              AM-PAC PT "6 Clicks" Mobility   Outcome Measure  Help needed turning from your back to your side while in a flat bed without using bedrails?: A Little Help needed moving from lying on your back to sitting on the side of a flat bed without using bedrails?: A Little Help needed moving to and from a bed to a chair (including a wheelchair)?: A Lot Help needed standing up from a chair using your arms (e.g., wheelchair or bedside chair)?: A Little Help needed to walk in hospital room?: A Lot Help needed climbing 3-5 steps with a railing? : A Lot 6 Click Score: 15    End of Session Equipment Utilized During Treatment: Gait belt Activity Tolerance: Patient tolerated treatment well Patient left: in chair;with call bell/phone within reach;with chair alarm set;with family/visitor present Nurse Communication: Mobility status PT Visit Diagnosis: Unsteadiness on feet (R26.81);Other abnormalities of gait and  mobility (R26.89);Muscle weakness (generalized) (M62.81);Difficulty in walking, not elsewhere classified (R26.2)     Time: 1010-1028 PT Time Calculation (min) (ACUTE ONLY): 18 min  Charges:  $Gait Training: 8-22 mins                     Madeline [name], PT, DPT Acute Rehabilitation Services Secure chat preferred Office #: [phone]    Madeline [name] 04/19/2023, 11:13 AM

## 2023-04-19 NOTE — Progress Notes (Signed)
Report called to [facility],   AVS reviewed with patient and her daughter. Patient's daughter will schedule follow-up appointments. Provided education on medications, diet, and signs and symptoms of hypoglycemia and hyperglycemia. Patient's daughter provided transportation.

## 2023-04-19 NOTE — Care Management Important Message (Signed)
Important Message  Patient Details  Name: Madeline [name] MRN: [id number] Date of Birth: [birth year]   Medicare Important Message Given:  Yes     Renie Ora 04/19/2023, 10:22 AM

## 2023-04-19 NOTE — TOC Progression Note (Signed)
Transition of Care [facility]) - Progression Note    Patient Details  Name: Madeline [name] MRN: [id number] Date of Birth: [birth year]  Transition of Care [facility]) CM/SW Contact  Leander Rams, LCSW Phone Number: 04/19/2023, 9:00 AM  Clinical Narrative:    Pt has insurance auth for SNF at [facility]. Authorization number is [id number] approval is good for 7 days. PTAR was denied and offered peer to peer. Peer to peer was not executed. Daughter states family will be able to take pt to SNF when medically stable.   TOC will continue to follow.    Expected Discharge Plan: Skilled Nursing Facility Barriers to Discharge: Continued Medical Work up  Expected Discharge Plan and Services     Post Acute Care Choice: Skilled Nursing Facility Living arrangements for the past 2 months: Skilled Nursing Facility Expected Discharge Date: 04/17/23                                     Social Determinants of Health (SDOH) Interventions SDOH Screenings   Food Insecurity: No Food Insecurity (09/02/2020)  Transportation Needs: No Transportation Needs (09/02/2020)  Depression (PHQ2-9): Low Risk  (09/02/2020)  Tobacco Use: High Risk (04/11/2023)    Readmission Risk Interventions    12/16/2021   10:51 AM  Readmission Risk Prevention Plan  Transportation Screening Complete  PCP or Specialist Appt within 5-7 Days Complete  Home Care Screening Complete  Medication Review (RN CM) Complete  Oletta Lamas, MSW, LCSWA, LCASA Transitions of Care  Clinical Social Worker I

## 2023-04-20 NOTE — TOC Transition Note (Addendum)
Transition of Care [facility]) - CM/SW Discharge Note   Patient Details  Name: Madeline [name] MRN: [id number] Date of Birth: [birth year]  Transition of Care [facility]) CM/SW Contact:  Leander Rams, LCSW Phone Number: 04/19/2023, 10:00 AM   Clinical Narrative:    Patient will DC to: [facility] Anticipated DC date: 04/19/2023 Family notified: Juliette Alcide Transport by: Daughter   Per MD patient ready for DC to [facility]. RN, patient, patient's family, and facility notified of DC. Discharge Summary and FL2 sent to facility. RN to call report prior to discharge [phone]. DC packet on chart. Ambulance transport requested for patient.   CSW will sign off for now as social work intervention is no longer needed. Please consult [place] again if new needs arise.    Final next level of care: Skilled Nursing Facility Barriers to Discharge: No Barriers Identified   Patient Goals and CMS Choice      Discharge Placement                Patient chooses bed at: [facility] Patient to be transferred to facility by: Daughter Name of family member notified: Juliette Alcide Patient and family notified of of transfer: 04/20/23  Discharge Plan and Services Additional resources added to the After Visit Summary for       Post Acute Care Choice: Skilled Nursing Facility                               Social Determinants of Health (SDOH) Interventions SDOH Screenings   Food Insecurity: No Food Insecurity (09/02/2020)  Transportation Needs: No Transportation Needs (09/02/2020)  Depression (PHQ2-9): Low Risk  (09/02/2020)  Tobacco Use: High Risk (04/11/2023)     Readmission Risk Interventions    12/16/2021   10:51 AM  Readmission Risk Prevention Plan  Transportation Screening Complete  PCP or Specialist Appt within 5-7 Days Complete  Home Care Screening Complete  Medication Review (RN CM) Complete      Oletta Lamas, MSW, LCSWA, LCASA Transitions of Care  Clinical Social Worker I

## 2023-04-21 DIAGNOSIS — E10649 Type 1 diabetes mellitus with hypoglycemia without coma: Secondary | ICD-10-CM | POA: Diagnosis not present

## 2023-04-21 DIAGNOSIS — Z794 Long term (current) use of insulin: Secondary | ICD-10-CM | POA: Diagnosis not present

## 2023-04-21 DIAGNOSIS — E440 Moderate protein-calorie malnutrition: Secondary | ICD-10-CM | POA: Diagnosis not present

## 2023-04-21 DIAGNOSIS — E119 Type 2 diabetes mellitus without complications: Secondary | ICD-10-CM | POA: Diagnosis not present

## 2023-04-21 DIAGNOSIS — M6281 Muscle weakness (generalized): Secondary | ICD-10-CM | POA: Diagnosis not present

## 2023-04-21 DIAGNOSIS — E785 Hyperlipidemia, unspecified: Secondary | ICD-10-CM | POA: Diagnosis not present

## 2023-04-21 DIAGNOSIS — I152 Hypertension secondary to endocrine disorders: Secondary | ICD-10-CM | POA: Diagnosis not present

## 2023-04-21 DIAGNOSIS — R5381 Other malaise: Secondary | ICD-10-CM | POA: Diagnosis not present

## 2023-04-26 DIAGNOSIS — E119 Type 2 diabetes mellitus without complications: Secondary | ICD-10-CM | POA: Diagnosis not present

## 2023-04-26 DIAGNOSIS — R5381 Other malaise: Secondary | ICD-10-CM | POA: Diagnosis not present

## 2023-04-26 DIAGNOSIS — E440 Moderate protein-calorie malnutrition: Secondary | ICD-10-CM | POA: Diagnosis not present

## 2023-04-26 DIAGNOSIS — M6281 Muscle weakness (generalized): Secondary | ICD-10-CM | POA: Diagnosis not present

## 2023-04-27 DIAGNOSIS — E1165 Type 2 diabetes mellitus with hyperglycemia: Secondary | ICD-10-CM | POA: Diagnosis not present

## 2023-04-27 DIAGNOSIS — D649 Anemia, unspecified: Secondary | ICD-10-CM | POA: Diagnosis not present

## 2023-04-27 DIAGNOSIS — E8729 Other acidosis: Secondary | ICD-10-CM | POA: Diagnosis not present

## 2023-04-29 DIAGNOSIS — K219 Gastro-esophageal reflux disease without esophagitis: Secondary | ICD-10-CM | POA: Diagnosis not present

## 2023-04-29 DIAGNOSIS — R7309 Other abnormal glucose: Secondary | ICD-10-CM | POA: Diagnosis not present

## 2023-04-29 DIAGNOSIS — E785 Hyperlipidemia, unspecified: Secondary | ICD-10-CM | POA: Diagnosis not present

## 2023-04-29 DIAGNOSIS — F17200 Nicotine dependence, unspecified, uncomplicated: Secondary | ICD-10-CM | POA: Diagnosis not present

## 2023-04-30 ENCOUNTER — Emergency Department
Admission: EM | Admit: 2023-04-30 | Discharge: 2023-04-30 | Disposition: A | Discharge status: Home / Self Care | Payer: PPO | Attending: Emergency Medicine | Admitting: Emergency Medicine

## 2023-04-30 ENCOUNTER — Emergency Department: Payer: PPO

## 2023-04-30 ENCOUNTER — Other Ambulatory Visit: Payer: Self-pay

## 2023-04-30 DIAGNOSIS — E1165 Type 2 diabetes mellitus with hyperglycemia: Secondary | ICD-10-CM | POA: Insufficient documentation

## 2023-04-30 DIAGNOSIS — Z950 Presence of cardiac pacemaker: Secondary | ICD-10-CM | POA: Insufficient documentation

## 2023-04-30 DIAGNOSIS — Z20822 Contact with and (suspected) exposure to covid-19: Secondary | ICD-10-CM | POA: Diagnosis not present

## 2023-04-30 DIAGNOSIS — S0990XA Unspecified injury of head, initial encounter: Secondary | ICD-10-CM | POA: Diagnosis not present

## 2023-04-30 DIAGNOSIS — I2510 Atherosclerotic heart disease of native coronary artery without angina pectoris: Secondary | ICD-10-CM | POA: Diagnosis not present

## 2023-04-30 DIAGNOSIS — R739 Hyperglycemia, unspecified: Secondary | ICD-10-CM

## 2023-04-30 DIAGNOSIS — Z794 Long term (current) use of insulin: Secondary | ICD-10-CM | POA: Diagnosis not present

## 2023-04-30 DIAGNOSIS — I10 Essential (primary) hypertension: Secondary | ICD-10-CM | POA: Diagnosis not present

## 2023-04-30 DIAGNOSIS — E039 Hypothyroidism, unspecified: Secondary | ICD-10-CM | POA: Insufficient documentation

## 2023-04-30 DIAGNOSIS — R531 Weakness: Secondary | ICD-10-CM | POA: Diagnosis not present

## 2023-04-30 DIAGNOSIS — I6782 Cerebral ischemia: Secondary | ICD-10-CM | POA: Diagnosis not present

## 2023-04-30 LAB — COMPREHENSIVE METABOLIC PANEL
ALT: 21 U/L (ref 0–44)
AST: 28 U/L (ref 15–41)
Albumin: 3.7 g/dL (ref 3.5–5.0)
Alkaline Phosphatase: 81 U/L (ref 38–126)
Anion gap: 11 (ref 5–15)
BUN: 19 mg/dL (ref 8–23)
CO2: 24 mmol/L (ref 22–32)
Calcium: 9 mg/dL (ref 8.9–10.3)
Chloride: 100 mmol/L (ref 98–111)
Creatinine, Ser: 0.69 mg/dL (ref 0.44–1.00)
GFR, Estimated: >60 mL/min (ref >60)
Glucose, Bld: 191 mg/dL — ABNORMAL HIGH (ref 70–99)
Potassium: 3.8 mmol/L (ref 3.5–5.1)
Sodium: 135 mmol/L (ref 135–145)
Total Bilirubin: 0.7 mg/dL (ref 0.3–1.2)
Total Protein: 6.4 g/dL — ABNORMAL LOW (ref 6.5–8.1)

## 2023-04-30 LAB — URINALYSIS, ROUTINE W REFLEX MICROSCOPIC
Bacteria, UA: NONE SEEN
Bilirubin Urine: NEGATIVE
Glucose, UA: >=500 mg/dL — AB
Hgb urine dipstick: NEGATIVE
Ketones, ur: NEGATIVE mg/dL
Leukocytes,Ua: NEGATIVE
Nitrite: NEGATIVE
Protein, ur: NEGATIVE mg/dL
Specific Gravity, Urine: 1.021 (ref 1.005–1.030)
pH: 5 (ref 5.0–8.0)

## 2023-04-30 LAB — CBC
HCT: 36.1 % (ref 36.0–46.0)
Hemoglobin: 11.8 g/dL — ABNORMAL LOW (ref 12.0–15.0)
MCH: 30.2 pg (ref 26.0–34.0)
MCHC: 32.7 g/dL (ref 30.0–36.0)
MCV: 92.3 fL (ref 80.0–100.0)
Platelets: 143 10*3/uL — ABNORMAL LOW (ref 150–400)
RBC: 3.91 MIL/uL (ref 3.87–5.11)
RDW: 14.4 % (ref 11.5–15.5)
WBC: 10.5 10*3/uL (ref 4.0–10.5)
nRBC: 0 % (ref 0.0–0.2)

## 2023-04-30 LAB — SARS CORONAVIRUS 2 BY RT PCR: SARS Coronavirus 2 by RT PCR: NEGATIVE

## 2023-04-30 LAB — CBG MONITORING, ED
Glucose-Capillary: 169 mg/dL — ABNORMAL HIGH (ref 70–99)
Glucose-Capillary: 148 mg/dL — ABNORMAL HIGH (ref 70–99)
Glucose-Capillary: 141 mg/dL — ABNORMAL HIGH (ref 70–99)

## 2023-04-30 LAB — TROPONIN I (HIGH SENSITIVITY)
Troponin I (High Sensitivity): 29 ng/L — ABNORMAL HIGH (ref <18)
Troponin I (High Sensitivity): 30 ng/L — ABNORMAL HIGH (ref <18)

## 2023-04-30 MED ORDER — SODIUM CHLORIDE 0.9 % IV BOLUS
500.0000 mL | Freq: Once | INTRAVENOUS | Status: AC
  Administered 2023-04-30: 500 mL via INTRAVENOUS

## 2023-04-30 NOTE — Discharge Instructions (Signed)
Continue the normal insulin regimen and sliding scale.  Return to the ER for new, worsening, or persistent abnormal blood sugar readings, confusion, lethargy, weakness, or any other new or worsening symptoms that concern you.

## 2023-04-30 NOTE — ED Triage Notes (Signed)
Patient to ED via EMS for elevated blood sugar, reported her sugar was >400 earlier so she took an unknown amount of insulin. BS with EMS 199

## 2023-04-30 NOTE — ED Provider Notes (Signed)
[facility] Provider Note    Event Date/Time   First MD Initiated Contact with Patient 04/30/23 1507     (approximate)   History   Hyperglycemia   HPI  Madeline [name] is a 89 y.o. female with a history of type 2 diabetes, CAD, hyperlipidemia, GERD, hypothyroidism, and s/p pacemaker who presents with abnormal blood glucose readings and altered mental status.  The daughter states that the patient got up during the middle of the night around 1:30 AM and gave herself a shot of an unknown dose of insulin with her her insulin pen.  Subsequently this morning she was found to be hypoglycemic to the 50s.  Later in the morning, her sugar was slightly elevated and she ate cereal and received insulin per her sliding scale.  Around midday and into the afternoon, the patient seemed somewhat confused compared to baseline.  At that time her sugar was reading over 400.  The family was unsure whether her sugar was truly high or low and whether to give insulin or feed her.  Currently, the patient states she feels slightly weak but denies any other acute symptoms.  The daughter also reports that the patient fell and hit the right side of her head earlier last night.  I reviewed the past medical records; the patient was most recently admitted to the hospitalist service at the end of last month for acute metabolic encephalopathy in the setting of recurrent hypoglycemia.    Physical Exam   Triage Vital Signs: ED Triage Vitals  Enc Vitals Group     BP 04/30/23 1438 (!) 160/62     Pulse Rate 04/30/23 1421 78     Resp 04/30/23 1421 18     Temp 04/30/23 1421 98.7 F (37.1 C)     Temp Source 04/30/23 1421 Oral     SpO2 04/30/23 1421 95 %     Weight --      Height --      Head Circumference --      Peak Flow --      Pain Score 04/30/23 1420 0     Pain Loc --      Pain Edu? --      Excl. in GC? --     Most recent vital signs: Vitals:   04/30/23 1730 04/30/23 1830  BP: (!)  158/62 (!) 158/81  Pulse: 80 85  Resp: 19 19  Temp:    SpO2: 93% 95%     General: Alert and oriented, no distress.  CV:  Good peripheral perfusion.  Resp:  Normal effort.  Abd:  No distention.  Other:  EOMI.  PERRLA.  Motor intact in all extremities.  Slightly dry mucous membranes.  Right facial/periorbital ecchymosis with no hematoma or swelling.   ED Results / Procedures / Treatments   Labs (all labs ordered are listed, but only abnormal results are displayed) Labs Reviewed  CBC - Abnormal; Notable for the following components:      Result Value   Hemoglobin 11.8 (*)    Platelets 143 (*)    All other components within normal limits  COMPREHENSIVE METABOLIC PANEL - Abnormal; Notable for the following components:   Glucose, Bld 191 (*)    Total Protein 6.4 (*)    All other components within normal limits  URINALYSIS, ROUTINE W REFLEX MICROSCOPIC - Abnormal; Notable for the following components:   Color, Urine YELLOW (*)    APPearance CLEAR (*)    Glucose, UA >=  500 (*)    All other components within normal limits  CBG MONITORING, ED - Abnormal; Notable for the following components:   Glucose-Capillary 169 (*)    All other components within normal limits  CBG MONITORING, ED - Abnormal; Notable for the following components:   Glucose-Capillary 148 (*)    All other components within normal limits  CBG MONITORING, ED - Abnormal; Notable for the following components:   Glucose-Capillary 141 (*)    All other components within normal limits  TROPONIN I (HIGH SENSITIVITY) - Abnormal; Notable for the following components:   Troponin I (High Sensitivity) 29 (*)    All other components within normal limits  TROPONIN I (HIGH SENSITIVITY) - Abnormal; Notable for the following components:   Troponin I (High Sensitivity) 30 (*)    All other components within normal limits  SARS CORONAVIRUS 2 BY RT PCR     EKG  ED ECG REPORT I, Dionne Bucy, the attending physician,  personally viewed and interpreted this ECG.  Date: 04/30/2023 EKG Time: 1422 Rate: 79 Rhythm: normal sinus rhythm QRS Axis: normal Intervals: LBBB ST/T Wave abnormalities: normal Narrative Interpretation: no evidence of acute ischemia    RADIOLOGY  CT head: I independently viewed and interpreted the images; there is no ICH or other acute abnormality.   PROCEDURES:  Critical Care performed: No  Procedures   MEDICATIONS ORDERED IN ED: Medications  sodium chloride 0.9 % bolus 500 mL (0 mLs Intravenous Stopped 04/30/23 1651)     IMPRESSION / MDM / ASSESSMENT AND PLAN / ED COURSE  I reviewed the triage vital signs and the nursing notes.  89 year old female with PMH as noted above presents with erratic blood glucose readings today after taking extra dose of insulin during the night.  She was initially hypoglycemic and hyperglycemic.  On exam she is overall well-appearing.  She is alert and oriented with stable vital signs and nonfocal neurologic exam.  The patient fell last night and lightheaded.  CT is negative.  Differential diagnosis includes, but is not limited to, accidental insulin overdose, uncomplicated hyperglycemia, less likely DKA or HHS.  Initial glucose here is in the 160s.  We will obtain basic labs, troponins, urinalysis, give a fluid bolus, and reassess.  Patient's presentation is most consistent with acute presentation with potential threat to life or bodily function.  The patient is on the cardiac monitor to evaluate for evidence of arrhythmia and/or significant heart rate changes.  ----------------------------------------- 6:54 PM on 04/30/2023 -----------------------------------------  Glucose has remained stable in the 100 throughout the patient's ED stay.  It is now over 6 hours since she is having insulin.  Initial troponin was borderline elevated although consistent with the patient's baseline.  Repeat shows no acute change.  Urinalysis, CMP, CBC,  COVID are all within normal limits.  The patient remains asymptomatic.  I did consider whether she may benefit from inpatient admission for further monitoring, glucose, however given that she has been asymptomatic with stable glucose she is appropriate for discharge.  The patient and her family member feel comfortable going home.  I gave strict return precautions and they expressed understanding.   FINAL CLINICAL IMPRESSION(S) / ED DIAGNOSES   Final diagnoses:  Hyperglycemia     Rx / DC Orders   ED Discharge Orders     None        Note:  This document was prepared using Dragon voice recognition software and may include unintentional dictation errors.    Dionne Bucy, MD 04/30/23  2350  

## 2023-05-03 DIAGNOSIS — Z9181 History of falling: Secondary | ICD-10-CM | POA: Diagnosis not present

## 2023-05-03 DIAGNOSIS — Z950 Presence of cardiac pacemaker: Secondary | ICD-10-CM | POA: Diagnosis not present

## 2023-05-03 DIAGNOSIS — Z8701 Personal history of pneumonia (recurrent): Secondary | ICD-10-CM | POA: Diagnosis not present

## 2023-05-03 DIAGNOSIS — I509 Heart failure, unspecified: Secondary | ICD-10-CM | POA: Diagnosis not present

## 2023-05-03 DIAGNOSIS — E1059 Type 1 diabetes mellitus with other circulatory complications: Secondary | ICD-10-CM | POA: Diagnosis not present

## 2023-05-03 DIAGNOSIS — K219 Gastro-esophageal reflux disease without esophagitis: Secondary | ICD-10-CM | POA: Diagnosis not present

## 2023-05-03 DIAGNOSIS — I2510 Atherosclerotic heart disease of native coronary artery without angina pectoris: Secondary | ICD-10-CM | POA: Diagnosis not present

## 2023-05-03 DIAGNOSIS — F17210 Nicotine dependence, cigarettes, uncomplicated: Secondary | ICD-10-CM | POA: Diagnosis not present

## 2023-05-03 DIAGNOSIS — J449 Chronic obstructive pulmonary disease, unspecified: Secondary | ICD-10-CM | POA: Diagnosis not present

## 2023-05-03 DIAGNOSIS — E785 Hyperlipidemia, unspecified: Secondary | ICD-10-CM | POA: Diagnosis not present

## 2023-05-03 DIAGNOSIS — E039 Hypothyroidism, unspecified: Secondary | ICD-10-CM | POA: Diagnosis not present

## 2023-05-03 DIAGNOSIS — I152 Hypertension secondary to endocrine disorders: Secondary | ICD-10-CM | POA: Diagnosis not present

## 2023-05-05 ENCOUNTER — Telehealth: Payer: Self-pay

## 2023-05-05 NOTE — Telephone Encounter (Signed)
Transition Care Management Follow-up Telephone Call Date of discharge and from where: [facility] 6/14 How have you been since you were released from the hospital? Much better Any questions or concerns? No  Items Reviewed: Did the pt receive and understand the discharge instructions provided? Yes  Medications obtained and verified? Yes  Other? No  Any new allergies since your discharge? No  Dietary orders reviewed? No Do you have support at home? Yes     Follow up appointments reviewed:  PCP Hospital f/u appt confirmed? Yes  Scheduled to see  on  @ . Specialist Hospital f/u appt confirmed? Yes  Scheduled to see OT PT  on THIS WEEK @ . Are transportation arrangements needed? No  If their condition worsens, is the pt aware to call PCP or go to the Emergency Dept.? Yes Was the patient provided with contact information for the PCP's office or ED? Yes Was to pt encouraged to call back with questions or concerns? Yes

## 2023-05-11 DIAGNOSIS — I509 Heart failure, unspecified: Secondary | ICD-10-CM | POA: Diagnosis not present

## 2023-05-11 DIAGNOSIS — I152 Hypertension secondary to endocrine disorders: Secondary | ICD-10-CM | POA: Diagnosis not present

## 2023-05-11 DIAGNOSIS — E1059 Type 1 diabetes mellitus with other circulatory complications: Secondary | ICD-10-CM | POA: Diagnosis not present

## 2023-05-11 DIAGNOSIS — E1043 Type 1 diabetes mellitus with diabetic autonomic (poly)neuropathy: Secondary | ICD-10-CM | POA: Diagnosis not present

## 2023-05-11 DIAGNOSIS — Z9641 Presence of insulin pump (external) (internal): Secondary | ICD-10-CM | POA: Diagnosis not present

## 2023-05-11 DIAGNOSIS — I2510 Atherosclerotic heart disease of native coronary artery without angina pectoris: Secondary | ICD-10-CM | POA: Diagnosis not present

## 2023-05-11 DIAGNOSIS — E10649 Type 1 diabetes mellitus with hypoglycemia without coma: Secondary | ICD-10-CM | POA: Diagnosis not present

## 2023-05-11 DIAGNOSIS — E1065 Type 1 diabetes mellitus with hyperglycemia: Secondary | ICD-10-CM | POA: Diagnosis not present

## 2023-05-11 DIAGNOSIS — J449 Chronic obstructive pulmonary disease, unspecified: Secondary | ICD-10-CM | POA: Diagnosis not present

## 2023-05-11 DIAGNOSIS — K219 Gastro-esophageal reflux disease without esophagitis: Secondary | ICD-10-CM | POA: Diagnosis not present

## 2023-05-11 DIAGNOSIS — E785 Hyperlipidemia, unspecified: Secondary | ICD-10-CM | POA: Diagnosis not present

## 2023-05-19 DIAGNOSIS — I10 Essential (primary) hypertension: Secondary | ICD-10-CM | POA: Diagnosis not present

## 2023-05-19 DIAGNOSIS — Z6821 Body mass index (BMI) 21.0-21.9, adult: Secondary | ICD-10-CM | POA: Diagnosis not present

## 2023-05-19 DIAGNOSIS — E109 Type 1 diabetes mellitus without complications: Secondary | ICD-10-CM | POA: Diagnosis not present

## 2023-05-19 DIAGNOSIS — Z515 Encounter for palliative care: Secondary | ICD-10-CM | POA: Diagnosis not present

## 2023-05-24 ENCOUNTER — Other Ambulatory Visit: Payer: Self-pay

## 2023-05-24 DIAGNOSIS — E1059 Type 1 diabetes mellitus with other circulatory complications: Secondary | ICD-10-CM | POA: Diagnosis not present

## 2023-05-24 DIAGNOSIS — Z862 Personal history of diseases of the blood and blood-forming organs and certain disorders involving the immune mechanism: Secondary | ICD-10-CM

## 2023-05-24 DIAGNOSIS — I509 Heart failure, unspecified: Secondary | ICD-10-CM | POA: Diagnosis not present

## 2023-05-24 DIAGNOSIS — I2510 Atherosclerotic heart disease of native coronary artery without angina pectoris: Secondary | ICD-10-CM | POA: Diagnosis not present

## 2023-05-24 DIAGNOSIS — K219 Gastro-esophageal reflux disease without esophagitis: Secondary | ICD-10-CM | POA: Diagnosis not present

## 2023-05-24 DIAGNOSIS — E785 Hyperlipidemia, unspecified: Secondary | ICD-10-CM | POA: Diagnosis not present

## 2023-05-24 DIAGNOSIS — I152 Hypertension secondary to endocrine disorders: Secondary | ICD-10-CM | POA: Diagnosis not present

## 2023-05-24 DIAGNOSIS — J449 Chronic obstructive pulmonary disease, unspecified: Secondary | ICD-10-CM | POA: Diagnosis not present

## 2023-05-25 ENCOUNTER — Inpatient Hospital Stay: Payer: PPO | Attending: Hematology

## 2023-05-25 DIAGNOSIS — R911 Solitary pulmonary nodule: Secondary | ICD-10-CM | POA: Diagnosis not present

## 2023-05-25 DIAGNOSIS — D693 Immune thrombocytopenic purpura: Secondary | ICD-10-CM | POA: Diagnosis not present

## 2023-05-25 DIAGNOSIS — Z862 Personal history of diseases of the blood and blood-forming organs and certain disorders involving the immune mechanism: Secondary | ICD-10-CM

## 2023-05-25 LAB — CBC WITH DIFFERENTIAL/PLATELET
Abs Immature Granulocytes: 0.01 10*3/uL (ref 0.00–0.07)
Basophils Absolute: 0 10*3/uL (ref 0.0–0.1)
Basophils Relative: 1 %
Eosinophils Absolute: 0.1 10*3/uL (ref 0.0–0.5)
Eosinophils Relative: 1 %
HCT: 38.5 % (ref 36.0–46.0)
Hemoglobin: 12.5 g/dL (ref 12.0–15.0)
Immature Granulocytes: 0 %
Lymphocytes Relative: 23 %
Lymphs Abs: 1.7 10*3/uL (ref 0.7–4.0)
MCH: 30 pg (ref 26.0–34.0)
MCHC: 32.5 g/dL (ref 30.0–36.0)
MCV: 92.3 fL (ref 80.0–100.0)
Monocytes Absolute: 0.6 10*3/uL (ref 0.1–1.0)
Monocytes Relative: 8 %
Neutro Abs: 5 10*3/uL (ref 1.7–7.7)
Neutrophils Relative %: 67 %
Platelets: 369 10*3/uL (ref 150–400)
RBC: 4.17 MIL/uL (ref 3.87–5.11)
RDW: 13.3 % (ref 11.5–15.5)
WBC: 7.4 10*3/uL (ref 4.0–10.5)
nRBC: 0 % (ref 0.0–0.2)

## 2023-05-25 LAB — COMPREHENSIVE METABOLIC PANEL
ALT: 22 U/L (ref 0–44)
AST: 33 U/L (ref 15–41)
Albumin: 3.6 g/dL (ref 3.5–5.0)
Alkaline Phosphatase: 79 U/L (ref 38–126)
Anion gap: 9 (ref 5–15)
BUN: 10 mg/dL (ref 8–23)
CO2: 25 mmol/L (ref 22–32)
Calcium: 8.6 mg/dL — ABNORMAL LOW (ref 8.9–10.3)
Chloride: 102 mmol/L (ref 98–111)
Creatinine, Ser: 0.85 mg/dL (ref 0.44–1.00)
GFR, Estimated: >60 mL/min (ref >60)
Glucose, Bld: 249 mg/dL — ABNORMAL HIGH (ref 70–99)
Potassium: 3.9 mmol/L (ref 3.5–5.1)
Sodium: 136 mmol/L (ref 135–145)
Total Bilirubin: 0.4 mg/dL (ref 0.3–1.2)
Total Protein: 6.5 g/dL (ref 6.5–8.1)

## 2023-05-25 LAB — LACTATE DEHYDROGENASE: LDH: 247 U/L — ABNORMAL HIGH (ref 98–192)

## 2023-06-01 ENCOUNTER — Encounter: Payer: Self-pay | Admitting: Oncology

## 2023-06-01 ENCOUNTER — Inpatient Hospital Stay (HOSPITAL_BASED_OUTPATIENT_CLINIC_OR_DEPARTMENT_OTHER): Payer: PPO | Admitting: Oncology

## 2023-06-01 VITALS — BP 142/58 | HR 88 | Temp 97.5°F | Resp 18 | Wt 115.7 lb

## 2023-06-01 DIAGNOSIS — Z862 Personal history of diseases of the blood and blood-forming organs and certain disorders involving the immune mechanism: Secondary | ICD-10-CM

## 2023-06-01 DIAGNOSIS — D693 Immune thrombocytopenic purpura: Secondary | ICD-10-CM | POA: Diagnosis not present

## 2023-06-01 NOTE — Progress Notes (Signed)
[facility] [address], [place] [address]   CLINIC:  Medical Oncology/Hematology  PCP:  Madeline Arbour, MD [address] [facility] [place] / [place] [phone]  REASON FOR VISIT:  Follow-up for chronic ITP with exacerbations  PRIOR THERAPY: none  CURRENT THERAPY: Promacta 25 mg daily.  INTERVAL HISTORY:  [name], a 89 y.o. female, seen for follow-up of immune mediated thrombocytopenia.  She was last seen in clinic on 03/04/2023.    She was hospitalized from 04/11/2023-04/19/23 for severe hypoglycemia and encephalopathy.  Blood sugar on arrival 39.  She required intubation with extubation on 04/12/2023.  She was also treated for pneumonia.  She is evaluated in 04/30/23 altered mental status, fall and abnormal blood glucose.  CT head was negative.  Stable blood sugars.   She is living at home with her husband.  She has-going to check on her frequently throughout the day.  She continues to tolerate Promacta well.  Denies any easy bruising or signs of bleeding.  Reports she did not take her Promacta for 2 to 3 weeks in June because she was out. Her energy and appetite are stable. She denies nausea, vomiting or abdominal pain.  Denies any recurrent diarrhea.  She denies fevers, chills, sweats, shortness of breath, chest pain or cough. She has no other complaints. Rest of the ROS is below.    Review of Systems  Constitutional:  Negative for appetite change, fatigue, fever and unexpected weight change.  HENT:   Negative for nosebleeds, sore throat and trouble swallowing.   Eyes: Negative.   Respiratory: Negative.  Negative for cough, shortness of breath and wheezing.   Cardiovascular: Negative.  Negative for chest pain and leg swelling.  Gastrointestinal:  Negative for abdominal pain, blood in stool, constipation, diarrhea, nausea and vomiting.  Endocrine: Negative.   Genitourinary: Negative.  Negative for bladder incontinence,  hematuria and nocturia.   Musculoskeletal: Negative.  Negative for back pain and flank pain.  Skin: Negative.   Neurological: Negative.  Negative for dizziness, headaches, light-headedness and numbness.  Hematological: Negative.   Psychiatric/Behavioral: Negative.  Negative for confusion. The patient is not nervous/anxious.     PAST MEDICAL/SURGICAL HISTORY:  Past Medical History:  Diagnosis Date   Bradycardia    Coronary artery disease    Diabetes mellitus    Dyslipidemia    GERD (gastroesophageal reflux disease)    Hypothyroid    Presence of permanent cardiac pacemaker 12/16/2018   Medtronic dual lead pacemaker   Past Surgical History:  Procedure Laterality Date   APPENDECTOMY     BREAST BIOPSY Right 1967   EXCISIONAL - NEG   CHOLECYSTECTOMY     CORONARY STENT INTERVENTION     INSERT / REPLACE / REMOVE PACEMAKER  12/16/2018   no surgical hx     PACEMAKER IMPLANT N/A 12/16/2018   Procedure: PACEMAKER IMPLANT;  Surgeon: Marinus Maw, MD;  Location: [facility] INVASIVE CV LAB;  Service: Cardiovascular;  Laterality: N/A;    SOCIAL HISTORY:  Social History   Socioeconomic History   Marital status: Married    Spouse name: Not on file   Number of children: Not on file   Years of education: Not on file   Highest education level: Not on file  Occupational History   Not on file  Tobacco Use   Smoking status: Every Day    Current packs/day: 1.00    Average packs/day: 1 pack/day for 50.0 years (  50.0 ttl pk-yrs)    Types: Cigarettes   Smokeless tobacco: Never  Vaping Use   Vaping status: Never Used  Substance and Sexual Activity   Alcohol use: No   Drug use: No   Sexual activity: Not on file  Other Topics Concern   Not on file  Social History Narrative   Not on file   Social Determinants of Health   Financial Resource Strain: Not on file  Food Insecurity: No Food Insecurity (09/02/2020)   Hunger Vital Sign    Worried About Running Out of Food in the Last Year: Never  true    Ran Out of Food in the Last Year: Never true  Transportation Needs: No Transportation Needs (09/02/2020)   PRAPARE - Administrator, Civil Service (Medical): No    Lack of Transportation (Non-Medical): No  Physical Activity: Not on file  Stress: Not on file  Social Connections: Not on file  Intimate Partner Violence: Not on file    FAMILY HISTORY:  Family History  Problem Relation Age of Onset   Heart Problems Mother    Other Father        UNK   Cancer Sister        BREAST   Breast cancer Sister 20   Cancer Sister        BREAST   Breast cancer Sister 51   Cancer Sister        BREAST   Diabetes Son     CURRENT MEDICATIONS:  Current Outpatient Medications  Medication Sig Dispense Refill   acetaminophen (TYLENOL) 325 MG tablet Take 1-2 tablets (325-650 mg total) by mouth every 6 (six) hours as needed for mild pain or moderate pain.     atorvastatin (LIPITOR) 20 MG tablet Take 1 tablet (20 mg total) by mouth daily at 6 PM. 90 tablet 3   citalopram (CELEXA) 20 MG tablet Take 20 mg by mouth daily.     eltrombopag (PROMACTA) 25 MG tablet Take 25 mg by mouth daily.     insulin aspart (NOVOLOG) 100 UNIT/ML injection Inject 0-15 Units into the skin 3 (three) times daily with meals. 10 mL 11   insulin aspart (NOVOLOG) 100 UNIT/ML injection Inject 5 Units into the skin 3 (three) times daily with meals. Hold if patient takes less than 50% of the meals 10 mL 11   insulin glargine-yfgn (SEMGLEE) 100 UNIT/ML injection Inject 0.2 mLs (20 Units total) into the skin daily. 10 mL 11   insulin glargine-yfgn (SEMGLEE) 100 UNIT/ML injection Inject 0.05 mLs (5 Units total) into the skin at bedtime. 10 mL 11   Insulin Syringes, Disposable, U-100 0.3 ML MISC 100 each by Does not apply route daily. 100 each 2   levothyroxine (SYNTHROID) 100 MCG tablet Take 100 mcg by mouth daily before breakfast.     Multiple Vitamin (MULTIVITAMIN WITH MINERALS) TABS tablet Take 1 tablet by mouth  daily.     nicotine (NICODERM CQ - DOSED IN MG/24 HOURS) 14 mg/24hr patch Place 1 patch (14 mg total) onto the skin daily. 28 patch 0   polyethylene glycol (MIRALAX / GLYCOLAX) 17 g packet Take 17 g by mouth daily as needed for moderate constipation. 14 each 0   lisinopril (PRINIVIL,ZESTRIL) 20 MG tablet Take 1 tablet (20 mg total) by mouth daily. (Patient taking differently: Take 10 mg by mouth daily.) 30 tablet 1   No current facility-administered medications for this visit.    ALLERGIES:  Allergies  Allergen Reactions  Darvon [Propoxyphene] Nausea And Vomiting   Sulfa Antibiotics Nausea And Vomiting    PHYSICAL EXAM:  Performance status (ECOG): 0 - Asymptomatic  Vitals:   06/01/23 1131 06/01/23 1137  BP: (!) 163/73 (!) 142/58  Pulse: 88   Resp: 18   Temp: (!) 97.5 F (36.4 C)   SpO2: 95%     Wt Readings from Last 3 Encounters:  06/01/23 115 lb 11.9 oz (52.5 kg)  04/19/23 120 lb 13 oz (54.8 kg)  03/20/23 124 lb (56.2 kg)   Physical Exam Constitutional:      Appearance: Normal appearance.  HENT:     Head: Normocephalic and atraumatic.  Eyes:     Pupils: Pupils are equal, round, and reactive to light.  Cardiovascular:     Rate and Rhythm: Normal rate and regular rhythm.     Heart sounds: Normal heart sounds. No murmur heard. Pulmonary:     Effort: Pulmonary effort is normal.     Breath sounds: Normal breath sounds. No wheezing.  Abdominal:     General: Bowel sounds are normal. There is no distension.     Palpations: Abdomen is soft.     Tenderness: There is no abdominal tenderness.  Musculoskeletal:        General: Normal range of motion.     Cervical back: Normal range of motion.  Skin:    General: Skin is warm and dry.     Findings: No rash.  Neurological:     Mental Status: She is alert and oriented to person, place, and time.  Psychiatric:        Judgment: Judgment normal.     LABORATORY DATA:  I have reviewed the labs as listed.     Latest Ref  Rng & Units 05/25/2023   11:09 AM 04/30/2023    2:30 PM 04/18/2023   12:58 AM  CBC  WBC 4.0 - 10.5 K/uL 7.4  10.5  8.2   Hemoglobin 12.0 - 15.0 g/dL 16.1  09.6  04.5   Hematocrit 36.0 - 46.0 % 38.5  36.1  33.9   Platelets 150 - 400 K/uL 369  143  217       Latest Ref Rng & Units 05/25/2023   11:09 AM 04/30/2023    2:30 PM 04/18/2023   12:58 AM  CMP  Glucose 70 - 99 mg/dL 409  811  914   BUN 8 - 23 mg/dL 10  19  20    Creatinine 0.44 - 1.00 mg/dL 7.82  9.56  2.13   Sodium 135 - 145 mmol/L 136  135  135   Potassium 3.5 - 5.1 mmol/L 3.9  3.8  4.3   Chloride 98 - 111 mmol/L 102  100  100   CO2 22 - 32 mmol/L 25  24  26    Calcium 8.9 - 10.3 mg/dL 8.6  9.0  8.1   Total Protein 6.5 - 8.1 g/dL 6.5  6.4    Total Bilirubin 0.3 - 1.2 mg/dL 0.4  0.7    Alkaline Phos 38 - 126 U/L 79  81    AST 15 - 41 U/L 33  28    ALT 0 - 44 U/L 22  21        Component Value Date/Time   RBC 4.17 05/25/2023 1109   MCV 92.3 05/25/2023 1109   MCV 92 12/09/2018 1308   MCH 30.0 05/25/2023 1109   MCHC 32.5 05/25/2023 1109   RDW 13.3 05/25/2023 1109   RDW 12.2 12/09/2018  1308   LYMPHSABS 1.7 05/25/2023 1109   LYMPHSABS 2.0 12/09/2018 1308   MONOABS 0.6 05/25/2023 1109   EOSABS 0.1 05/25/2023 1109   EOSABS 0.2 12/09/2018 1308   BASOSABS 0.0 05/25/2023 1109   BASOSABS 0.0 12/09/2018 1308    DIAGNOSTIC IMAGING:  I have independently reviewed the scans and discussed with the patient. No results found.   ASSESSMENT:  1.  Chronic ITP with exacerbations: Patient had thrombocytopenia since July 2016.  CT abdomen on 02/02/2018 showed normal-sized spleen.  Bone marrow aspiration and biopsy in 09/15/2017 showed hypercellular marrow for age, trilineage hematopoiesis with abundant megakaryocytes with no dysplasia. - Prednisone tapering dose over 5 weeks given on 12/14/2017 with no improvement in platelet count - IVIG (1 g/kg) on 12/23/2017 and 12/24/2017 with peak platelet count of 92, response lasting about 3  weeks -Dexamethasone 40 mg for 4 days on 01/11/2018 with improvement in platelet count of 61. - IVIG 1 g/kg on 12/18/2021 and 12/19/2021 without response. - Nplate 2 mcg/kg on 12/22/2021. - Promacta 25 mg daily started on 12/26/2021.   2.  Right middle lobe lung nodule: -CT of the chest on 08/08/2018 showed right middle lobe lung nodule with one-year follow-up recommended.  - CT chest on 04/16/2022 shows benign lung nodules with no abnormalities.  No further scans needed.  PLAN:  1.  Chronic ITP with exacerbations: - Continues to tolerate Promacta well. -Labs from 05/25/2023 show a platelet count 369,000.  No other cytopenias.  CMP is within normal.  LDH is 247. -Continue Promacta 25 mg daily. -Return to clinic in 4 months for follow-up and repeat lab work.   2.  Health maintenance: - Mammogram on 12/30/2022 was BI-RADS Category 1-negative --Next mammogram due in one year, February 2025.   3.  Abnormal blood sugars: -Has been hospitalized twice over the past month for low blood sugars.  -Blood sugar from 05/25/2023 show glucose of 249.   Orders placed this encounter:  No orders of the defined types were placed in this encounter.  PLAN SUMMARY: >> Continue Promacta 25mg  daily. >> Return to clinic in 4 months with repeat lab work and follow-up with NP/MD.    I spent 25 minutes dedicated to the care of this patient (face-to-face and non-face-to-face) on the date of the encounter to include what is described in the assessment and plan.  Durenda Hurt, NP 06/01/2023 12:06 PM

## 2023-06-11 ENCOUNTER — Ambulatory Visit (INDEPENDENT_AMBULATORY_CARE_PROVIDER_SITE_OTHER): Payer: PPO

## 2023-06-11 DIAGNOSIS — I442 Atrioventricular block, complete: Secondary | ICD-10-CM | POA: Diagnosis not present

## 2023-06-11 LAB — CUP PACEART REMOTE DEVICE CHECK
Battery Remaining Longevity: 12 mo
Battery Voltage: 2.84 V
Brady Statistic AP VP Percent: 0.52 %
Brady Statistic AP VS Percent: 0 %
Brady Statistic AS VP Percent: 99.11 %
Brady Statistic AS VS Percent: 0.37 %
Brady Statistic RA Percent Paced: 0.54 %
Brady Statistic RV Percent Paced: 99.63 %
Date Time Interrogation Session: 20240726070521
Implantable Lead Connection Status: 753985
Implantable Lead Connection Status: 753985
Implantable Lead Implant Date: 20200131
Implantable Lead Implant Date: 20200131
Implantable Lead Location: 753859
Implantable Lead Location: 753860
Implantable Lead Model: 3830
Implantable Lead Model: 5076
Implantable Pulse Generator Implant Date: 20200131
Lead Channel Impedance Value: 285 Ohm
Lead Channel Impedance Value: 342 Ohm
Lead Channel Impedance Value: 399 Ohm
Lead Channel Impedance Value: 418 Ohm
Lead Channel Pacing Threshold Amplitude: 0.5 V
Lead Channel Pacing Threshold Amplitude: 1.375 V
Lead Channel Pacing Threshold Pulse Width: 0.4 ms
Lead Channel Pacing Threshold Pulse Width: 0.4 ms
Lead Channel Sensing Intrinsic Amplitude: 13.875 mV
Lead Channel Sensing Intrinsic Amplitude: 13.875 mV
Lead Channel Sensing Intrinsic Amplitude: 2.5 mV
Lead Channel Sensing Intrinsic Amplitude: 2.5 mV
Lead Channel Setting Pacing Amplitude: 1.5 V
Lead Channel Setting Pacing Amplitude: 2.5 V
Lead Channel Setting Pacing Pulse Width: 0.5 ms
Lead Channel Setting Sensing Sensitivity: 4 mV
Zone Setting Status: 755011
Zone Setting Status: 755011

## 2023-06-16 DIAGNOSIS — E1043 Type 1 diabetes mellitus with diabetic autonomic (poly)neuropathy: Secondary | ICD-10-CM | POA: Diagnosis not present

## 2023-06-16 DIAGNOSIS — M25511 Pain in right shoulder: Secondary | ICD-10-CM | POA: Diagnosis not present

## 2023-06-16 DIAGNOSIS — E7800 Pure hypercholesterolemia, unspecified: Secondary | ICD-10-CM | POA: Diagnosis not present

## 2023-06-16 DIAGNOSIS — D696 Thrombocytopenia, unspecified: Secondary | ICD-10-CM | POA: Diagnosis not present

## 2023-06-16 DIAGNOSIS — Z0000 Encounter for general adult medical examination without abnormal findings: Secondary | ICD-10-CM | POA: Diagnosis not present

## 2023-06-16 DIAGNOSIS — G8929 Other chronic pain: Secondary | ICD-10-CM | POA: Diagnosis not present

## 2023-06-16 DIAGNOSIS — E039 Hypothyroidism, unspecified: Secondary | ICD-10-CM | POA: Diagnosis not present

## 2023-06-16 DIAGNOSIS — I5032 Chronic diastolic (congestive) heart failure: Secondary | ICD-10-CM | POA: Diagnosis not present

## 2023-06-16 DIAGNOSIS — J431 Panlobular emphysema: Secondary | ICD-10-CM | POA: Diagnosis not present

## 2023-06-16 DIAGNOSIS — I10 Essential (primary) hypertension: Secondary | ICD-10-CM | POA: Diagnosis not present

## 2023-06-17 DIAGNOSIS — Z9641 Presence of insulin pump (external) (internal): Secondary | ICD-10-CM | POA: Diagnosis not present

## 2023-06-17 DIAGNOSIS — E1065 Type 1 diabetes mellitus with hyperglycemia: Secondary | ICD-10-CM | POA: Diagnosis not present

## 2023-06-17 DIAGNOSIS — E10649 Type 1 diabetes mellitus with hypoglycemia without coma: Secondary | ICD-10-CM | POA: Diagnosis not present

## 2023-06-17 DIAGNOSIS — E1043 Type 1 diabetes mellitus with diabetic autonomic (poly)neuropathy: Secondary | ICD-10-CM | POA: Diagnosis not present

## 2023-06-18 NOTE — Progress Notes (Signed)
Remote pacemaker transmission.   

## 2023-07-03 ENCOUNTER — Emergency Department (HOSPITAL_COMMUNITY): Payer: PPO

## 2023-07-03 ENCOUNTER — Inpatient Hospital Stay (HOSPITAL_COMMUNITY)
Admission: EM | Admit: 2023-07-03 | Discharge: 2023-07-12 | DRG: 064 | Disposition: A | Discharge status: Hospice | Payer: PPO | Attending: Interventional Radiology | Admitting: Interventional Radiology

## 2023-07-03 DIAGNOSIS — K3184 Gastroparesis: Secondary | ICD-10-CM | POA: Diagnosis present

## 2023-07-03 DIAGNOSIS — I050 Rheumatic mitral stenosis: Secondary | ICD-10-CM | POA: Diagnosis present

## 2023-07-03 DIAGNOSIS — R29818 Other symptoms and signs involving the nervous system: Secondary | ICD-10-CM | POA: Diagnosis not present

## 2023-07-03 DIAGNOSIS — Z955 Presence of coronary angioplasty implant and graft: Secondary | ICD-10-CM

## 2023-07-03 DIAGNOSIS — Z950 Presence of cardiac pacemaker: Secondary | ICD-10-CM

## 2023-07-03 DIAGNOSIS — J9600 Acute respiratory failure, unspecified whether with hypoxia or hypercapnia: Secondary | ICD-10-CM | POA: Diagnosis not present

## 2023-07-03 DIAGNOSIS — W19XXXA Unspecified fall, initial encounter: Secondary | ICD-10-CM | POA: Diagnosis present

## 2023-07-03 DIAGNOSIS — R569 Unspecified convulsions: Secondary | ICD-10-CM

## 2023-07-03 DIAGNOSIS — I69351 Hemiplegia and hemiparesis following cerebral infarction affecting right dominant side: Secondary | ICD-10-CM

## 2023-07-03 DIAGNOSIS — I10 Essential (primary) hypertension: Secondary | ICD-10-CM | POA: Diagnosis present

## 2023-07-03 DIAGNOSIS — Z9911 Dependence on respirator [ventilator] status: Secondary | ICD-10-CM

## 2023-07-03 DIAGNOSIS — I6529 Occlusion and stenosis of unspecified carotid artery: Secondary | ICD-10-CM | POA: Diagnosis not present

## 2023-07-03 DIAGNOSIS — M50221 Other cervical disc displacement at C4-C5 level: Secondary | ICD-10-CM | POA: Diagnosis not present

## 2023-07-03 DIAGNOSIS — R4701 Aphasia: Secondary | ICD-10-CM | POA: Diagnosis not present

## 2023-07-03 DIAGNOSIS — Z66 Do not resuscitate: Secondary | ICD-10-CM | POA: Diagnosis present

## 2023-07-03 DIAGNOSIS — R4182 Altered mental status, unspecified: Principal | ICD-10-CM | POA: Diagnosis present

## 2023-07-03 DIAGNOSIS — E039 Hypothyroidism, unspecified: Secondary | ICD-10-CM | POA: Diagnosis present

## 2023-07-03 DIAGNOSIS — F17210 Nicotine dependence, cigarettes, uncomplicated: Secondary | ICD-10-CM | POA: Diagnosis present

## 2023-07-03 DIAGNOSIS — K219 Gastro-esophageal reflux disease without esophagitis: Secondary | ICD-10-CM | POA: Diagnosis present

## 2023-07-03 DIAGNOSIS — Z882 Allergy status to sulfonamides status: Secondary | ICD-10-CM

## 2023-07-03 DIAGNOSIS — Z8249 Family history of ischemic heart disease and other diseases of the circulatory system: Secondary | ICD-10-CM

## 2023-07-03 DIAGNOSIS — E11649 Type 2 diabetes mellitus with hypoglycemia without coma: Secondary | ICD-10-CM | POA: Diagnosis present

## 2023-07-03 DIAGNOSIS — Z885 Allergy status to narcotic agent status: Secondary | ICD-10-CM

## 2023-07-03 DIAGNOSIS — R471 Dysarthria and anarthria: Secondary | ICD-10-CM | POA: Diagnosis present

## 2023-07-03 DIAGNOSIS — R55 Syncope and collapse: Secondary | ICD-10-CM | POA: Diagnosis not present

## 2023-07-03 DIAGNOSIS — I63522 Cerebral infarction due to unspecified occlusion or stenosis of left anterior cerebral artery: Secondary | ICD-10-CM | POA: Diagnosis present

## 2023-07-03 DIAGNOSIS — R29712 NIHSS score 12: Secondary | ICD-10-CM | POA: Diagnosis not present

## 2023-07-03 DIAGNOSIS — R251 Tremor, unspecified: Secondary | ICD-10-CM | POA: Diagnosis present

## 2023-07-03 DIAGNOSIS — Z79890 Hormone replacement therapy (postmenopausal): Secondary | ICD-10-CM

## 2023-07-03 DIAGNOSIS — I442 Atrioventricular block, complete: Secondary | ICD-10-CM | POA: Diagnosis present

## 2023-07-03 DIAGNOSIS — R404 Transient alteration of awareness: Secondary | ICD-10-CM | POA: Diagnosis not present

## 2023-07-03 DIAGNOSIS — I63512 Cerebral infarction due to unspecified occlusion or stenosis of left middle cerebral artery: Secondary | ICD-10-CM

## 2023-07-03 DIAGNOSIS — E785 Hyperlipidemia, unspecified: Secondary | ICD-10-CM | POA: Diagnosis present

## 2023-07-03 DIAGNOSIS — E1165 Type 2 diabetes mellitus with hyperglycemia: Secondary | ICD-10-CM | POA: Diagnosis not present

## 2023-07-03 DIAGNOSIS — D693 Immune thrombocytopenic purpura: Secondary | ICD-10-CM | POA: Diagnosis present

## 2023-07-03 DIAGNOSIS — R0902 Hypoxemia: Secondary | ICD-10-CM | POA: Diagnosis not present

## 2023-07-03 DIAGNOSIS — Z79899 Other long term (current) drug therapy: Secondary | ICD-10-CM

## 2023-07-03 DIAGNOSIS — D6489 Other specified anemias: Secondary | ICD-10-CM | POA: Diagnosis present

## 2023-07-03 DIAGNOSIS — Z833 Family history of diabetes mellitus: Secondary | ICD-10-CM

## 2023-07-03 DIAGNOSIS — Z794 Long term (current) use of insulin: Secondary | ICD-10-CM

## 2023-07-03 DIAGNOSIS — I63523 Cerebral infarction due to unspecified occlusion or stenosis of bilateral anterior cerebral arteries: Secondary | ICD-10-CM | POA: Diagnosis not present

## 2023-07-03 DIAGNOSIS — I2510 Atherosclerotic heart disease of native coronary artery without angina pectoris: Secondary | ICD-10-CM | POA: Diagnosis present

## 2023-07-03 DIAGNOSIS — R531 Weakness: Secondary | ICD-10-CM | POA: Diagnosis not present

## 2023-07-03 DIAGNOSIS — I517 Cardiomegaly: Secondary | ICD-10-CM | POA: Diagnosis not present

## 2023-07-03 DIAGNOSIS — Z515 Encounter for palliative care: Secondary | ICD-10-CM

## 2023-07-03 DIAGNOSIS — F940 Selective mutism: Secondary | ICD-10-CM | POA: Diagnosis present

## 2023-07-03 DIAGNOSIS — J449 Chronic obstructive pulmonary disease, unspecified: Secondary | ICD-10-CM | POA: Diagnosis present

## 2023-07-03 DIAGNOSIS — M47812 Spondylosis without myelopathy or radiculopathy, cervical region: Secondary | ICD-10-CM | POA: Diagnosis not present

## 2023-07-03 DIAGNOSIS — E876 Hypokalemia: Secondary | ICD-10-CM | POA: Diagnosis present

## 2023-07-03 DIAGNOSIS — R29703 NIHSS score 3: Secondary | ICD-10-CM | POA: Diagnosis present

## 2023-07-03 DIAGNOSIS — R29720 NIHSS score 20: Secondary | ICD-10-CM | POA: Diagnosis not present

## 2023-07-03 LAB — COMPREHENSIVE METABOLIC PANEL
ALT: 19 U/L (ref 0–44)
AST: 24 U/L (ref 15–41)
Albumin: 3.3 g/dL — ABNORMAL LOW (ref 3.5–5.0)
Alkaline Phosphatase: 87 U/L (ref 38–126)
Anion gap: 9 (ref 5–15)
BUN: 11 mg/dL (ref 8–23)
CO2: 28 mmol/L (ref 22–32)
Calcium: 9 mg/dL (ref 8.9–10.3)
Chloride: 104 mmol/L (ref 98–111)
Creatinine, Ser: 0.87 mg/dL (ref 0.44–1.00)
GFR, Estimated: >60 mL/min (ref >60)
Glucose, Bld: 92 mg/dL (ref 70–99)
Potassium: 4.2 mmol/L (ref 3.5–5.1)
Sodium: 141 mmol/L (ref 135–145)
Total Bilirubin: 0.6 mg/dL (ref 0.3–1.2)
Total Protein: 6.4 g/dL — ABNORMAL LOW (ref 6.5–8.1)

## 2023-07-03 LAB — CBC
HCT: 35.6 % — ABNORMAL LOW (ref 36.0–46.0)
Hemoglobin: 11.5 g/dL — ABNORMAL LOW (ref 12.0–15.0)
MCH: 29.2 pg (ref 26.0–34.0)
MCHC: 32.3 g/dL (ref 30.0–36.0)
MCV: 90.4 fL (ref 80.0–100.0)
Platelets: 429 10*3/uL — ABNORMAL HIGH (ref 150–400)
RBC: 3.94 MIL/uL (ref 3.87–5.11)
RDW: 12.6 % (ref 11.5–15.5)
WBC: 7.7 10*3/uL (ref 4.0–10.5)
nRBC: 0 % (ref 0.0–0.2)

## 2023-07-03 LAB — I-STAT CHEM 8, ED
BUN: 11 mg/dL (ref 8–23)
Calcium, Ion: 1.19 mmol/L (ref 1.15–1.40)
Chloride: 103 mmol/L (ref 98–111)
Creatinine, Ser: 0.8 mg/dL (ref 0.44–1.00)
Glucose, Bld: 86 mg/dL (ref 70–99)
HCT: 39 % (ref 36.0–46.0)
Hemoglobin: 13.3 g/dL (ref 12.0–15.0)
Potassium: 4.1 mmol/L (ref 3.5–5.1)
Sodium: 142 mmol/L (ref 135–145)
TCO2: 29 mmol/L (ref 22–32)

## 2023-07-03 LAB — ETHANOL: Alcohol, Ethyl (B): <10 mg/dL (ref <10)

## 2023-07-03 LAB — DIFFERENTIAL
Abs Immature Granulocytes: 0.02 10*3/uL (ref 0.00–0.07)
Basophils Absolute: 0.1 10*3/uL (ref 0.0–0.1)
Basophils Relative: 1 %
Eosinophils Absolute: 0.3 10*3/uL (ref 0.0–0.5)
Eosinophils Relative: 4 %
Immature Granulocytes: 0 %
Lymphocytes Relative: 20 %
Lymphs Abs: 1.6 10*3/uL (ref 0.7–4.0)
Monocytes Absolute: 0.7 10*3/uL (ref 0.1–1.0)
Monocytes Relative: 10 %
Neutro Abs: 5.1 10*3/uL (ref 1.7–7.7)
Neutrophils Relative %: 65 %

## 2023-07-03 LAB — PROTIME-INR
INR: 1 (ref 0.8–1.2)
Prothrombin Time: 13.4 seconds (ref 11.4–15.2)

## 2023-07-03 LAB — CBG MONITORING, ED
Glucose-Capillary: 81 mg/dL (ref 70–99)
Glucose-Capillary: 122 mg/dL — ABNORMAL HIGH (ref 70–99)
Glucose-Capillary: 185 mg/dL — ABNORMAL HIGH (ref 70–99)
Glucose-Capillary: 227 mg/dL — ABNORMAL HIGH (ref 70–99)

## 2023-07-03 LAB — APTT: aPTT: 27 seconds (ref 24–36)

## 2023-07-03 MED ORDER — INSULIN ASPART 100 UNIT/ML IJ SOLN
0.0000 [IU] | Freq: Every day | INTRAMUSCULAR | Status: DC
  Administered 2023-07-04: 3 [IU] via SUBCUTANEOUS

## 2023-07-03 MED ORDER — ELTROMBOPAG OLAMINE 25 MG PO TABS: 25.0000 mg | ORAL_TABLET | Freq: Every day | ORAL | Status: DC

## 2023-07-03 MED ORDER — INSULIN ASPART 100 UNIT/ML IJ SOLN
0.0000 [IU] | Freq: Three times a day (TID) | INTRAMUSCULAR | Status: DC
  Administered 2023-07-04: 3 [IU] via SUBCUTANEOUS

## 2023-07-03 MED ORDER — ATORVASTATIN CALCIUM 10 MG PO TABS: 20.0000 mg | ORAL_TABLET | Freq: Every day | ORAL | Status: DC

## 2023-07-03 MED ORDER — LEVOTHYROXINE SODIUM 75 MCG PO TABS
75.0000 ug | ORAL_TABLET | Freq: Every day | ORAL | Status: DC
  Filled 2023-07-03: qty 1

## 2023-07-03 MED ORDER — PROCHLORPERAZINE EDISYLATE 10 MG/2ML IJ SOLN
5.0000 mg | Freq: Once | INTRAMUSCULAR | Status: DC
Start: 2023-07-03 — End: 2023-07-03

## 2023-07-03 MED ORDER — ACETAMINOPHEN 325 MG PO TABS: 650.0000 mg | ORAL_TABLET | Freq: Four times a day (QID) | ORAL | Status: DC | PRN

## 2023-07-03 MED ORDER — INSULIN GLARGINE-YFGN 100 UNIT/ML ~~LOC~~ SOLN
8.0000 [IU] | Freq: Every day | SUBCUTANEOUS | Status: DC
  Administered 2023-07-04: 8 [IU] via SUBCUTANEOUS
  Filled 2023-07-03 (×2): qty 0.08

## 2023-07-03 MED ORDER — POLYETHYLENE GLYCOL 3350 17 G PO PACK: 17.0000 g | PACK | Freq: Every day | ORAL | Status: DC | PRN

## 2023-07-03 MED ORDER — ENOXAPARIN SODIUM 40 MG/0.4ML IJ SOSY
40.0000 mg | PREFILLED_SYRINGE | INTRAMUSCULAR | Status: DC
  Administered 2023-07-03: 40 mg via SUBCUTANEOUS
  Filled 2023-07-03: qty 0.4

## 2023-07-03 MED ORDER — ACETAMINOPHEN 650 MG RE SUPP: 650.0000 mg | Freq: Four times a day (QID) | RECTAL | Status: DC | PRN

## 2023-07-03 NOTE — ED Triage Notes (Signed)
TRIAGE NOTE:  Patient arrives by EMS from home patient was last at baseline around 11 am, when daughter gave patient her insulin.  Patient then about 30 minutes later was found sitting on floor next to bed and dresser.   Unknown if patient fell.   At baseline patient alert and oriented x 4 and ambulatory.   Per report patient was unable to sit up and stand up, but will bear some weight on left leg.     EMS CBG 108

## 2023-07-03 NOTE — ED Notes (Signed)
This RN spoke with admitting team about needing orders for blood sugar control.

## 2023-07-03 NOTE — Progress Notes (Signed)
Orthopedic Tech Progress Note Patient Details:  Macall Mulay [facility] 11/29/1933 [id number]  Level II trauma. Patient ID: SHONTRICE AROCHA, female   DOB: 1934/04/03, 89 y.o.   MRN: [id number]  Docia Furl 07/03/2023, 2:04 PM

## 2023-07-03 NOTE — Consult Note (Signed)
Neurology Consultation  Reason for Consult: Right-sided weakness and aphasia Referring Physician: Dr. Lockie Mola  CC: None  History is obtained from: Patient and chart  HPI: Madeline [name] is a 89 y.o. female with history of diabetes, COPD, diabetic gastroparesis, hyperlipidemia, hypertension, pacemaker and hypothyroidism who presents after being found in her home sitting between her bed and her dresser.  She was last known to be normal at 11:00 when her daughter gave her her lunchtime insulin.  30 minutes later, she was found to be sitting between her bed and her dresser, uncertain if she had fallen.  Patient has no memory of what happened.  She states she has never had a seizure and is uncertain sure if she fell.  Head CT was negative for acute abnormality, and capillary blood glucose was noted to be 81.  LKW: 1100 TNK given?: no, symptoms too mild to treat and thought not to be due to stroke IR Thrombectomy? No, exam not consistent with LVO Modified Rankin Scale: 2-Slight disability-UNABLE to perform all activities but does not need assistance  ROS: A complete ROS was performed and is negative except as noted in the HPI.   Past Medical History:  Diagnosis Date   Bradycardia    Coronary artery disease    Diabetes mellitus    Dyslipidemia    GERD (gastroesophageal reflux disease)    Hypothyroid    Presence of permanent cardiac pacemaker 12/16/2018   Medtronic dual lead pacemaker     Family History  Problem Relation Age of Onset   Heart Problems Mother    Other Father        UNK   Cancer Sister        BREAST   Breast cancer Sister 76   Cancer Sister        BREAST   Breast cancer Sister 60   Cancer Sister        BREAST   Diabetes Son      Social History:   reports that she has been smoking cigarettes. She has a 50 pack-year smoking history. She has never used smokeless tobacco. She reports that she does not drink alcohol and does not use drugs.  Medications No  current facility-administered medications for this encounter.  Current Outpatient Medications:    acetaminophen (TYLENOL) 325 MG tablet, Take 1-2 tablets (325-650 mg total) by mouth every 6 (six) hours as needed for mild pain or moderate pain., Disp: , Rfl:    atorvastatin (LIPITOR) 20 MG tablet, Take 1 tablet (20 mg total) by mouth daily at 6 PM., Disp: 90 tablet, Rfl: 3   citalopram (CELEXA) 20 MG tablet, Take 20 mg by mouth daily., Disp: , Rfl:    eltrombopag (PROMACTA) 25 MG tablet, Take 25 mg by mouth daily., Disp: , Rfl:    insulin aspart (NOVOLOG) 100 UNIT/ML injection, Inject 0-15 Units into the skin 3 (three) times daily with meals., Disp: 10 mL, Rfl: 11   insulin aspart (NOVOLOG) 100 UNIT/ML injection, Inject 5 Units into the skin 3 (three) times daily with meals. Hold if patient takes less than 50% of the meals, Disp: 10 mL, Rfl: 11   insulin glargine-yfgn (SEMGLEE) 100 UNIT/ML injection, Inject 0.2 mLs (20 Units total) into the skin daily., Disp: 10 mL, Rfl: 11   insulin glargine-yfgn (SEMGLEE) 100 UNIT/ML injection, Inject 0.05 mLs (5 Units total) into the skin at bedtime., Disp: 10 mL, Rfl: 11   Insulin Syringes, Disposable, U-100 0.3 ML MISC, 100 each by Does not  apply route daily., Disp: 100 each, Rfl: 2   levothyroxine (SYNTHROID) 100 MCG tablet, Take 100 mcg by mouth daily before breakfast., Disp: , Rfl:    lisinopril (PRINIVIL,ZESTRIL) 20 MG tablet, Take 1 tablet (20 mg total) by mouth daily. (Patient taking differently: Take 10 mg by mouth daily.), Disp: 30 tablet, Rfl: 1   Multiple Vitamin (MULTIVITAMIN WITH MINERALS) TABS tablet, Take 1 tablet by mouth daily., Disp: , Rfl:    nicotine (NICODERM CQ - DOSED IN MG/24 HOURS) 14 mg/24hr patch, Place 1 patch (14 mg total) onto the skin daily., Disp: 28 patch, Rfl: 0   polyethylene glycol (MIRALAX / GLYCOLAX) 17 g packet, Take 17 g by mouth daily as needed for moderate constipation., Disp: 14 each, Rfl: 0   Exam: Current vital  signs: BP (!) 185/76   Pulse 71   Resp 20   SpO2 97%  Vital signs in last 24 hours: Pulse Rate:  [71] 71 (08/17 1400) Resp:  [20] 20 (08/17 1400) BP: (168-185)/(76) 185/76 (08/17 1439) SpO2:  [97 %] 97 % (08/17 1400)  GENERAL: Awake, alert, in no acute distress Psych: Affect appropriate for situation, patient is calm and cooperative with examination Head: Normocephalic and atraumatic, without obvious abnormality EENT: Normal conjunctivae, moist mucous membranes, no OP obstruction LUNGS: Normal respiratory effort. Non-labored breathing on room air Extremities: warm, well perfused, without obvious deformity  NEURO:  Mental Status: Awake, alert, and oriented to person and place but disoriented to time and situation She is not able to provide a clear and coherent history of present illness. Speech/Language: speech is dysarthric and in single words and short phrases.   Naming, and comprehension intact without aphasia  No neglect is noted Cranial Nerves:  II: PERRL visual fields full.  III, IV, VI: EOMI. Lid elevation symmetric and full.  V: Sensation is intact to light touch and intact on both sides of face patient states that sensation is different but is unable to articulate how VII: Face is symmetric resting and smiling.  VIII: Hearing intact to voice IX, X: Voice is slightly dysarthric XI: Normal sternocleidomastoid and trapezius muscle strength XII: Tongue protrudes midline without fasciculations.   Motor: Able to move all 4 extremities with good antigravity strength Tone is normal. Bulk is normal.  Sensation: Intact to light touch bilaterally in all four extremities, patient states sensation between right and left is different but is unable to articulate how or on which side sensation is stronger. No extinction to DSS present.  Coordination: FTN intact bilaterally. No pronator drift.  Gait: Deferred  NIHSS: 1a Level of Conscious.: 0 1b LOC Questions: 1 1c LOC Commands: 0 2  Best Gaze: 0 3 Visual: 0 4 Facial Palsy: 0 5a Motor Arm - left: 0 5b Motor Arm - Right: 0 6a Motor Leg - Left: 0 6b Motor Leg - Right: 1 7 Limb Ataxia: 0 8 Sensory: 0 9 Best Language: 0 10 Dysarthria: 1 11 Extinct. and Inatten.: 0 TOTAL: 3   Labs I have reviewed labs in epic and the results pertinent to this consultation are:  CBC    Component Value Date/Time   WBC 7.7 07/03/2023 1402   RBC 3.94 07/03/2023 1402   HGB 13.3 07/03/2023 1422   HGB 11.3 12/09/2018 1308   HCT 39.0 07/03/2023 1422   HCT 33.1 (L) 12/09/2018 1308   PLT 429 (H) 07/03/2023 1402   PLT 52 (LL) 12/09/2018 1308   MCV 90.4 07/03/2023 1402   MCV 92 12/09/2018 1308  MCH 29.2 07/03/2023 1402   MCHC 32.3 07/03/2023 1402   RDW 12.6 07/03/2023 1402   RDW 12.2 12/09/2018 1308   LYMPHSABS 1.6 07/03/2023 1402   LYMPHSABS 2.0 12/09/2018 1308   MONOABS 0.7 07/03/2023 1402   EOSABS 0.3 07/03/2023 1402   EOSABS 0.2 12/09/2018 1308   BASOSABS 0.1 07/03/2023 1402   BASOSABS 0.0 12/09/2018 1308    CMP     Component Value Date/Time   NA 142 07/03/2023 1422   NA 143 12/09/2018 1308   K 4.1 07/03/2023 1422   CL 103 07/03/2023 1422   CO2 28 07/03/2023 1402   GLUCOSE 86 07/03/2023 1422   BUN 11 07/03/2023 1422   BUN 8 12/09/2018 1308   CREATININE 0.80 07/03/2023 1422   CREATININE 0.73 08/03/2016 1519   CALCIUM 9.0 07/03/2023 1402   PROT 6.4 (L) 07/03/2023 1402   ALBUMIN 3.3 (L) 07/03/2023 1402   AST 24 07/03/2023 1402   ALT 19 07/03/2023 1402   ALKPHOS 87 07/03/2023 1402   BILITOT 0.6 07/03/2023 1402   GFRNONAA >60 07/03/2023 1402   GFRAA >60 01/31/2020 1202    Lipid Panel     Component Value Date/Time   CHOL 134 08/07/2019 0857   TRIG 92 04/12/2023 0434   HDL 71 08/07/2019 0857   CHOLHDL 1.9 08/07/2019 0857   CHOLHDL 1.7 08/03/2016 1519   VLDL 19 08/03/2016 1519   LDLCALC 51 08/07/2019 0857     Imaging I have reviewed the images obtained:  CT-scan of the brain: No acute  abnormality  MRI examination of the brain: Pending  Assessment: 89 year old patient with history of diabetes, COPD, diabetic gastroparesis, hyperlipidemia, hypertension, pacemaker and hypothyroidism presents after being found between her bed and her dresser earlier today.  She was noted to be at her baseline, alert and oriented x 4 and ambulatory at 11:00 when her daughter gave her her insulin.  30 minutes later, she was found to be sitting between her bed and her dresser, unclear if she had fallen or not.  Patient states she has no memory of what happened.  CT head demonstrated no acute abnormalities.  On exam, she had slight dysarthria but was able to name objects easily.  Minor right leg drift and disorientation to time and situation noted on exam as well.  She stated that sensation was different between her right and left sides but was unable to articulate how and was able to feel light touch easily in all 4 extremities and face.  She has no history of seizures or syncope but both are on the differential.  Will also perform brain MRI when able to rule out small stroke.  Patient's capillary glucose was also noted to be 81, so mild hypoglycemia may be contributing to her presentation, instructed RN to give her some p.o. glucose.  She will also need workup for toxic metabolic encephalopathy.  Impression: Weakness and altered mental status and patient status post possible fall with differential to include seizure, syncope and toxic metabolic encephalopathy  Recommendations: -Brain MRI. If negative for acute infarct, no further stroke workup indicated -Routine EEG -Treat mild hypoglycemia -Workup for syncope and toxic metabolic encephalopathy per primary team  Pt seen by NP/Neuro and later by MD. Note/plan to be edited by MD as needed.  Cortney E Ernestina Columbia , MSN, AGACNP-BC [facility] See Amion for schedule and pager information 07/03/2023 2:50 PM   Attending Neurohospitalist  Addendum Patient seen and examined with APP/Resident. Agree with the history and  physical as documented above. Agree with the plan as documented, which I helped formulate. I have edited the note above to reflect my full findings and recommendations. I have independently reviewed the chart, obtained history, review of systems and examined the patient.I have personally reviewed pertinent head/neck/spine imaging (CT/MRI). Please feel free to call with any questions.  -- Bing Neighbors, MD [facility] [phone]  If 7pm- 7am, please page neurology on call as listed in AMION.

## 2023-07-03 NOTE — ED Notes (Signed)
Trauma Response Nurse Documentation  Madeline [name] is a 89 y.o. female arriving to [facility] ED via EMS  On No antithrombotic. Trauma was activated as a Level 2 based on the following trauma criteria GCS 10-14 associated with trauma or AVPU < A.  Patient cleared for CT by Dr. Lockie Mola. Code stroke activated prior to transport due to concern for aphasia. Pt transported to CT with trauma response nurse present to monitor. RN remained with the patient throughout their absence from the department for clinical observation. GCS 11.  History   Past Medical History:  Diagnosis Date   Bradycardia    Coronary artery disease    Diabetes mellitus    Dyslipidemia    GERD (gastroesophageal reflux disease)    Hypothyroid    Presence of permanent cardiac pacemaker 12/16/2018   Medtronic dual lead pacemaker     Past Surgical History:  Procedure Laterality Date   APPENDECTOMY     BREAST BIOPSY Right 1967   EXCISIONAL - NEG   CHOLECYSTECTOMY     CORONARY STENT INTERVENTION     INSERT / REPLACE / REMOVE PACEMAKER  12/16/2018   no surgical hx     PACEMAKER IMPLANT N/A 12/16/2018   Procedure: PACEMAKER IMPLANT;  Surgeon: Marinus Maw, MD;  Location: [facility] INVASIVE CV LAB;  Service: Cardiovascular;  Laterality: N/A;     Initial Focused Assessment (If applicable, or please see trauma documentation): Patient unable to speak, unsure of orientation, PERR 3, does seem to follow some commands Airway intact, bilateral breath sounds Pulses 2+ Per family LNK 11AM, ate and took insulin, found beside bed unable to get up 30 minutes later. On arrival patient unable to speak, appears to attempt to say her name but not able to get out the words. Code stroke activated per Dr Lockie Mola, CBG 81.  CT's Completed:   CT Head and CT C-Spine   Interventions:  IV, labs Code stroke activated CT Head/Cspine CBG 81  Plan for disposition:  Admission to [facility]   Event Summary: Patient to ED after being  found by family down beside her bed, LKN 11AM when she ate oatmeal and took insulin. Patient appears aphasic and weaker on right side, Code Stroke activated per Dr Lockie Mola. CT head completed. No stroke or traumatic injury on imaging. Further syncopal workup to include MRI (has a pacemaker), EEG, Echo, CXR. Patients daughter at bedside.  Bedside handoff with ED RN Marcelino Duster.    Jill Side Mekala Winger  Trauma Response RN  Please call TRN at [phone] for further assistance.

## 2023-07-03 NOTE — Progress Notes (Signed)
EEG complete - results pending 

## 2023-07-03 NOTE — Progress Notes (Signed)
   07/03/23 1400  Spiritual Encounters  Type of Visit Initial  Conversation partners present during encounter Nurse  Referral source Trauma page  Reason for visit Trauma  OnCall Visit Yes   Chaplain responded to a trauma in the ED - level II fall. Per EMS, family is on their way. No needs at this time. Spiritual care services available as needed.   Alda Ponder, Chaplain 07/03/23

## 2023-07-03 NOTE — Hospital Course (Addendum)
Family saw patient sitting between bed and dresser.   Patient finds speech difficult. Daughters in room. Per daughters, patient got up this morning and came in the kitchen for breakfast. Fixed own oatmeal -- was given 5U this morning. BG 137. Went to bedroom to change clothes -- couldn't zip up pants when patient fell and tried to prop herself up on her bed, unsuccessfully. She did not lose consciousness. Patient was weak and couldn't talk. Only concern: wouldn't talk at the time. Repeat BG was fine. Received insulin 11. Last known normal around 11:30. Denies incontinence of bowel. Denies shaking/convulsing. Unknown if post-ictal state, because patient was non-verbal at the time. Couldn't squeeze hand when commanded. Slightly better on interview now as she will answer questions. No known seizure history. Daughter say she has BM yesterday. Did not look at it. No known issues with passing stool. No complaints of palpitations.  Patient was intubated for 1 hours earlier this year for severe hypoglycemia and encephalopathy, and needed 2 weeks SAR. Became altered, fell and hit her head afterwards at a [employer].   Normal mental baseline: does not use walker/cane. Goes up and down steps with railing. Walks around the house. Walks to [employer]. Dresses self. Goes to bathroom on her own. Relies on daughters for shots. Responds appropriately to questions.   Has fallen twice in the past: once, brother reports patient fell while outside. Speech was intact at that time. Another time, patient "leaned over on couch" and became non-responsive, which required 911 call and ED visit.   Patient takes many medications at baseline, including:  Synthroid - Hypothyroidism Platelets - thrombocytopenia Blood pressure medications   Tylenol no Lipitor yes Celexa no Promacta yes (given this morning) Insulin sliding scale Takes at night 19 unites every night Synthroid yes (given this morning) Lisinopril yes Miralax PRN for  constipation Multivitamin yes Nicotine patch no  All other medications not given at home.   Surgeries: pacemaker placed. Stents put in.   Family history:  Mother: goiter and seizures  Father: died of MI  Social: Employment: retired from office job Alcohol/drugs: no  Tobacco: Former smoker, 80+ pack years quit this year ("smoked a lot"). History of COPD.  PCP: Osborne Oman  Patient can follow verbal directions (peace sign, wave).   Patient responds "yes" when asked where she is. Could not say full name or name of daughter. Smiles but does not answer. Begins to form words. Can clap hands and wiggle toes. Sensation intact in BL lowere extremitiees.   CN III: reactive. CN V: normal sensation CN XI: intac  Abdominal Exam: no guarding, nontender, no rebound tenderness.   MSK: Nodes on L arm -- related to shots, per family.  Differential remains broad. Will give half of home insulin tonight.    8/18 Family at bedside this am and overnight: daughter? Had BM overnight. Slept all night. Not opening eyes much and ackowledged daughter a little but much.  Daughter reports pt is right hand dominant When PT came: opened eyes but unable to kick right leg, able to kick left leg. PE: pt sleeping on exam. Somnolent, difficult to arouse to voice. Arouses to raising head of bed.  Breathing comfortably on 2L [place]. +hearing aid right ear

## 2023-07-03 NOTE — ED Notes (Signed)
Patient transported to CT 

## 2023-07-03 NOTE — ED Notes (Signed)
ED TO INPATIENT HANDOFF REPORT  ED Nurse Name and Phone #: [phone]  S Name/Age/Gender Madeline [name] 89 y.o. female Room/Bed: 029C/029C  Code Status   Code Status: Full Code  Home/SNF/Other Home Patient oriented to: self, place, time, and situation Is this baseline? Yes   Triage Complete: Triage complete  Chief Complaint Altered mental state [R41.82]  Triage Note TRIAGE NOTE:  Patient arrives by EMS from home patient was last at baseline around 11 am, when daughter gave patient her insulin.  Patient then about 30 minutes later was found sitting on floor next to bed and dresser.   Unknown if patient fell.   At baseline patient alert and oriented x 4 and ambulatory.   Per report patient was unable to sit up and stand up, but will bear some weight on left leg.     EMS CBG 108   Allergies Allergies  Allergen Reactions   Darvon [Propoxyphene] Nausea And Vomiting   Sulfa Antibiotics Nausea And Vomiting    Level of Care/Admitting Diagnosis ED Disposition     ED Disposition  Admit   Condition  --   Comment  Hospital Area: [facility] [[id number]]  Level of Care: Med-Surg [16]  May place patient in observation at [facility] or [facility] if equivalent level of care is available:: No  Covid Evaluation: Asymptomatic - no recent exposure (last 10 days) testing not required  Diagnosis: Altered mental state [952841]  Admitting Physician: Miguel Aschoff [[id number]]  Attending Physician: Miguel Aschoff [[id number]]          B Medical/Surgery History Past Medical History:  Diagnosis Date   Bradycardia    Coronary artery disease    Diabetes mellitus    Dyslipidemia    GERD (gastroesophageal reflux disease)    Hypothyroid    Presence of permanent cardiac pacemaker 12/16/2018   Medtronic dual lead pacemaker   Past Surgical History:  Procedure Laterality Date   APPENDECTOMY     BREAST BIOPSY Right 1967   EXCISIONAL - NEG   CHOLECYSTECTOMY      CORONARY STENT INTERVENTION     INSERT / REPLACE / REMOVE PACEMAKER  12/16/2018   no surgical hx     PACEMAKER IMPLANT N/A 12/16/2018   Procedure: PACEMAKER IMPLANT;  Surgeon: Marinus Maw, MD;  Location: [facility];  Service: Cardiovascular;  Laterality: N/A;     A IV Location/Drains/Wounds Patient Lines/Drains/Airways Status     Active Line/Drains/Airways     Name Placement date Placement time Site Days   External Urinary Catheter 04/16/23  2115  --  78   Wound / Incision (Open or Dehisced) 04/12/23 Skin tear Elbow Posterior;Right 04/12/23  0142  Elbow  82            Intake/Output Last 24 hours No intake or output data in the 24 hours ending 07/03/23 1919  Labs/Imaging Results for orders placed or performed during the hospital encounter of 07/03/23 (from the past 48 hour(s))  Protime-INR     Status: None   Collection Time: 07/03/23  2:02 PM  Result Value Ref Range   Prothrombin Time 13.4 11.4 - 15.2 seconds   INR 1.0 0.8 - 1.2    Comment: (NOTE) INR goal varies based on device and disease states. Performed at [facility], [address], [place], [place] [address]   APTT     Status: None   Collection Time: 07/03/23  2:02 PM  Result Value Ref  Range   aPTT 27 24 - 36 seconds    Comment: Performed at [facility], [address], [place], [place] [address]  CBC     Status: Abnormal   Collection Time: 07/03/23  2:02 PM  Result Value Ref Range   WBC 7.7 4.0 - 10.5 K/uL   RBC 3.94 3.87 - 5.11 MIL/uL   Hemoglobin 11.5 (L) 12.0 - 15.0 g/dL   HCT 62.1 (L) 30.8 - 65.7 %   MCV 90.4 80.0 - 100.0 fL   MCH 29.2 26.0 - 34.0 pg   MCHC 32.3 30.0 - 36.0 g/dL   RDW 84.6 96.2 - 95.2 %   Platelets 429 (H) 150 - 400 K/uL   nRBC 0.0 0.0 - 0.2 %    Comment: Performed at [facility], [address], [place], [place] [address]  Differential     Status: None   Collection Time: 07/03/23  2:02 PM  Result Value Ref Range   Neutrophils Relative % 65 %    Neutro Abs 5.1 1.7 - 7.7 K/uL   Lymphocytes Relative 20 %   Lymphs Abs 1.6 0.7 - 4.0 K/uL   Monocytes Relative 10 %   Monocytes Absolute 0.7 0.1 - 1.0 K/uL   Eosinophils Relative 4 %   Eosinophils Absolute 0.3 0.0 - 0.5 K/uL   Basophils Relative 1 %   Basophils Absolute 0.1 0.0 - 0.1 K/uL   Immature Granulocytes 0 %   Abs Immature Granulocytes 0.02 0.00 - 0.07 K/uL    Comment: Performed at [facility], [address] [place], [place] [address]  Comprehensive metabolic panel     Status: Abnormal   Collection Time: 07/03/23  2:02 PM  Result Value Ref Range   Sodium 141 135 - 145 mmol/L   Potassium 4.2 3.5 - 5.1 mmol/L   Chloride 104 98 - 111 mmol/L   CO2 28 22 - 32 mmol/L   Glucose, Bld 92 70 - 99 mg/dL    Comment: Glucose reference range applies only to samples taken after fasting for at least 8 hours.   BUN 11 8 - 23 mg/dL   Creatinine, Ser 2.72 0.44 - 1.00 mg/dL   Calcium 9.0 8.9 - 53.6 mg/dL   Total Protein 6.4 (L) 6.5 - 8.1 g/dL   Albumin 3.3 (L) 3.5 - 5.0 g/dL   AST 24 15 - 41 U/L   ALT 19 0 - 44 U/L   Alkaline Phosphatase 87 38 - 126 U/L   Total Bilirubin 0.6 0.3 - 1.2 mg/dL   GFR, Estimated >64 >40 mL/min    Comment: (NOTE) Calculated using the CKD-EPI Creatinine Equation (2021)    Anion gap 9 5 - 15    Comment: Performed at [facility], [address] [place], [place] [address]  CBG monitoring, ED     Status: None   Collection Time: 07/03/23  2:15 PM  Result Value Ref Range   Glucose-Capillary 81 70 - 99 mg/dL    Comment: Glucose reference range applies only to samples taken after fasting for at least 8 hours.  Ethanol     Status: None   Collection Time: 07/03/23  2:17 PM  Result Value Ref Range   Alcohol, Ethyl (B) <10 <10 mg/dL    Comment: (NOTE) Lowest detectable limit for serum alcohol is 10 mg/dL.  For medical purposes only. Performed at [facility], [address] [place], [place] [address]   I-stat chem 8,  ED     Status: None    Collection Time: 07/03/23  2:22 PM  Result Value Ref Range   Sodium 142 135 - 145 mmol/L   Potassium 4.1 3.5 - 5.1 mmol/L   Chloride 103 98 - 111 mmol/L   BUN 11 8 - 23 mg/dL   Creatinine, Ser 6.57 0.44 - 1.00 mg/dL   Glucose, Bld 86 70 - 99 mg/dL    Comment: Glucose reference range applies only to samples taken after fasting for at least 8 hours.   Calcium, Ion 1.19 1.15 - 1.40 mmol/L   TCO2 29 22 - 32 mmol/L   Hemoglobin 13.3 12.0 - 15.0 g/dL   HCT 84.6 96.2 - 95.2 %  CBG monitoring, ED     Status: Abnormal   Collection Time: 07/03/23  3:01 PM  Result Value Ref Range   Glucose-Capillary 122 (H) 70 - 99 mg/dL    Comment: Glucose reference range applies only to samples taken after fasting for at least 8 hours.   DG Chest Portable 1 View  Result Date: 07/03/2023 CLINICAL DATA:  Syncope. EXAM: PORTABLE CHEST 1 VIEW COMPARISON:  Chest radiograph dated 04/30/2023. FINDINGS: The heart is mildly enlarged. Vascular calcifications are seen in the aortic arch. A left subclavian approach cardiac device is redemonstrated. The lungs are clear. There is no pleural effusion or pneumothorax. Degenerative changes are seen in the spine. IMPRESSION: Mild cardiomegaly.  No acute cardiopulmonary process. Electronically Signed   By: Romona Curls M.D.   On: 07/03/2023 16:29   CT CERVICAL SPINE WO CONTRAST  Result Date: 07/03/2023 CLINICAL DATA:  Fall.  Altered mental status.  Found down. EXAM: CT CERVICAL SPINE WITHOUT CONTRAST TECHNIQUE: Multidetector CT imaging of the cervical spine was performed without intravenous contrast. Multiplanar CT image reconstructions were also generated. RADIATION DOSE REDUCTION: This exam was performed according to the departmental dose-optimization program which includes automated exposure control, adjustment of the mA and/or kV according to patient size and/or use of iterative reconstruction technique. COMPARISON:  CT cervical spine 04/16/2022. FINDINGS: Alignment: Unchanged  3 mm retrolisthesis of C4 on C5. No traumatic malalignment. Skull base and vertebrae: No acute fracture. Normal craniocervical junction. No suspicious bone lesions. Multilevel degenerative endplate changes. Soft tissues and spinal canal: No prevertebral fluid or swelling. No visible canal hematoma. Disc levels: Multilevel cervical spondylosis, worst at C4-5, where there is mild spinal canal stenosis. Upper chest: No acute findings. Other: Atherosclerotic calcifications of the carotid bulbs. IMPRESSION: 1. No acute fracture or traumatic malalignment of the cervical spine. 2. Multilevel cervical spondylosis, worst at C4-5, where there is mild spinal canal stenosis. Electronically Signed   By: Orvan Falconer M.D.   On: 07/03/2023 14:56   CT HEAD CODE STROKE WO CONTRAST  Result Date: 07/03/2023 CLINICAL DATA:  Code stroke. Neuro deficit, acute, stroke suspected. Right-sided weakness. Aphasia. EXAM: CT HEAD WITHOUT CONTRAST TECHNIQUE: Contiguous axial images were obtained from the base of the skull through the vertex without intravenous contrast. RADIATION DOSE REDUCTION: This exam was performed according to the departmental dose-optimization program which includes automated exposure control, adjustment of the mA and/or kV according to patient size and/or use of iterative reconstruction technique. COMPARISON:  Head CT 04/30/2023. FINDINGS: Brain: No acute hemorrhage. Stable mild chronic small-vessel disease with old lacunar infarcts in the left caudate and right thalamus. Cortical gray-white differentiation is otherwise preserved. Prominence of the ventricles and sulci within expected range for age. No hydrocephalus or extra-axial collection. No mass effect or midline shift. Vascular:  No hyperdense vessel or unexpected calcification. Skull: No calvarial fracture or suspicious bone lesion. Skull base is unremarkable. Sinuses/Orbits: No acute finding. Other: None. ASPECTS [facility] Stroke Program Early CT Score) -  Ganglionic level infarction (caudate, lentiform nuclei, internal capsule, insula, M1-M3 cortex): 7 - Supraganglionic infarction (M4-M6 cortex): 3 Total score (0-10 with 10 being normal): 10 IMPRESSION: 1. No acute intracranial hemorrhage or evidence of acute large vessel territory infarct. ASPECT score is 10. 2. Stable mild chronic small-vessel disease with old lacunar infarcts in the left caudate and right thalamus. Code stroke imaging results were communicated on 07/03/2023 at 2:36 pm to provider Dr. Selina Cooley via secure text paging. Electronically Signed   By: Orvan Falconer M.D.   On: 07/03/2023 14:36    Pending Labs Unresulted Labs (From admission, onward)     Start     Ordered   07/04/23 0500  CBC  Tomorrow morning,   R        07/03/23 1707   07/04/23 0500  Basic metabolic panel  Tomorrow morning,   R        07/03/23 1707   07/04/23 0500  TSH  Tomorrow morning,   R        07/03/23 1707   07/04/23 0500  Magnesium  Tomorrow morning,   R        07/03/23 1709   07/04/23 0500  Phosphorus  Tomorrow morning,   R        07/03/23 1709   07/03/23 1417  Urine rapid drug screen (hosp performed)  Once,   STAT        07/03/23 1416   07/03/23 1417  Urinalysis, Routine w reflex microscopic -Urine, Clean Catch  Once,   URGENT       Question:  Specimen Source  Answer:  Urine, Clean Catch   07/03/23 1416            Vitals/Pain Today's Vitals   07/03/23 1439 07/03/23 1450 07/03/23 1515 07/03/23 1520  BP: (!) 185/76  (!) 148/75   Pulse:   75   Resp:   17   Temp:    98 F (36.7 C)  TempSrc:    Oral  SpO2:   97%   Weight:  52.2 kg    Height:  5\' 2"  (1.575 m)    PainSc:        Isolation Precautions No active isolations  Medications Medications  enoxaparin (LOVENOX) injection 40 mg (has no administration in time range)  acetaminophen (TYLENOL) tablet 650 mg (has no administration in time range)    Or  acetaminophen (TYLENOL) suppository 650 mg (has no administration in time range)   polyethylene glycol (MIRALAX / GLYCOLAX) packet 17 g (has no administration in time range)  atorvastatin (LIPITOR) tablet 20 mg (has no administration in time range)  eltrombopag (PROMACTA) tablet 25 mg (has no administration in time range)  levothyroxine (SYNTHROID) tablet 75 mcg (has no administration in time range)    Mobility walks with device     Focused Assessments Neuro Assessment Handoff:  Swallow screen pass? Yes    NIH Stroke Scale  Dizziness Present: No Headache Present: No Interval: Initial Level of Consciousness (1a.)   : Alert, keenly responsive LOC Questions (1b. )   : Answers one question correctly LOC Commands (1c. )   : Performs both tasks correctly Best Gaze (2. )  : Normal Visual (3. )  : No visual loss Facial Palsy (4. )    : Normal symmetrical  movements Motor Arm, Left (5a. )   : No drift Motor Arm, Right (5b. ) : No drift Motor Leg, Left (6a. )  : No drift Motor Leg, Right (6b. ) : No drift Limb Ataxia (7. ): Absent Sensory (8. )  : Mild-to-moderate sensory loss, patient feels pinprick is less sharp or is dull on the affected side, or there is a loss of superficial pain with pinprick, but patient is aware of being touched Best Language (9. )  : No aphasia Dysarthria (10. ): Mild-to-moderate dysarthria, patient slurs at least some words and, at worst, can be understood with some difficulty Extinction/Inattention (11.)   : No Abnormality Complete NIHSS TOTAL: 3     Neuro Assessment:   Neuro Checks:   Initial (07/03/23 1440)  Has TPA been given? No If patient is a Neuro Trauma and patient is going to OR before floor call report to 4N Charge nurse: [phone] or [phone]   R Recommendations: See Admitting Provider Note  Report given to:   Additional Notes:

## 2023-07-03 NOTE — H&P (Cosign Needed)
Date: 07/03/2023               Patient Name:  Madeline [name] MRN: [id number]  DOB: 1933/12/19 Age / Sex: 89 y.o., female   PCP: Marguarite Arbour, MD         Medical Service: Internal Medicine Teaching Service         Attending Physician: Dr. Mayford Knife, Dorene Ar, MD      First Contact: Dr. Kathleen Lime, MD Pager [phone]    Second Contact: Dr. Modena Slater, DO Pager [phone]         After Hours (After 5p/  First Contact Pager: [phone]  weekends / holidays): Second Contact Pager: [phone]   SUBJECTIVE   Chief Complaint: "Altered Mental Status after an unwitnessed fall"   History of Present Illness: Patient is a 89 year old female with past medical history of CAD with pacemaker, diabetes, hyperlipidemia, hypertension  and COPD who presented to the ED after her daughter found her somewhat confused and seated in between her dresser and her bed at her home.  Her daughter reports that around 11:00 this morning, patient was within her normal state of mind, woke up this morning and fix her oatmeal for breakfast and everything looked normal until around 11 time when she found her mom sitting down in her bedroom, confused, and couldn't answer any questions that was asked. Per Daughter,pt  received her usual 5 units of insulin this morning after her breakfast and her glucose was 137 at that time.  Around 11:10 AM this morning, daughter found her mom sitting on the floor very weak and could not talk so she called EMS.  Of note, this is about the second episode where her mom has been somewhat confused and could not speak and that is why she got worried and called the EMS.The daughter didn't witness any fall, shaking and any seizing activities   At home, patient is independent,takes her own medications,walks around the house daily,goes up and down the stairs with rails and is able to do all her activities of daily living except shooting her insuline for which her daughter helps her with.   ED  Course: Had an EEG,CT head and C-Spine,Code stroke activation and CBG.  CT head and C-spine SHOWED  Diffuse cerebral atrophy. Ventricular dilatation consistent with central atrophy. Low-attenuation changes in the deep white matter consistent with small vessel ischemia. No abnormal extra-axial fluid collections. No mass effect or midline shift.Gray-white matter junctions are distinct. Basal cisterns are not effaced. No acute intracranial hemorrhage.VTE ppx initiated with 40 mg Lovenox and code status verified as FULL CODE   Meds:  Current Meds  Medication Sig   acetaminophen (TYLENOL) 325 MG tablet Take 1-2 tablets (325-650 mg total) by mouth every 6 (six) hours as needed for mild pain or moderate pain.   atorvastatin (LIPITOR) 20 MG tablet Take 1 tablet (20 mg total) by mouth daily at 6 PM.   eltrombopag (PROMACTA) 25 MG tablet Take 25 mg by mouth daily before breakfast.   Glucagon (GVOKE HYPOPEN [place]) Inject 1 Dose into the skin as needed (hypoglycemia).   insulin glargine (LANTUS) 100 UNIT/ML Solostar Pen Inject 19 Units into the skin every evening.   insulin lispro (HUMALOG KWIKPEN) 100 UNIT/ML KwikPen Inject 0-13 Units into the skin 3 (three) times daily before meals. Inject 0-13 units three times daily before meals per sliding scale: < 70 : 0 units 100-160 : 5 units 151-200 : 7 units 201-250 : 9 units 251-300 : 11  units > 301 : 13 units   levothyroxine (SYNTHROID) 75 MCG tablet Take 75 mcg by mouth daily before breakfast.   lisinopril (PRINIVIL,ZESTRIL) 20 MG tablet Take 1 tablet (20 mg total) by mouth daily. (Patient taking differently: Take 10 mg by mouth every evening.)   Multiple Vitamins-Minerals (MULTIVITAMIN WOMEN 50+) TABS Take 1 tablet by mouth every evening.    Past Medical History T2DM HLD HTN CAD with pacemaker COPD  Past Surgical History:  Procedure Laterality Date   APPENDECTOMY     BREAST BIOPSY Right 1967   EXCISIONAL - NEG   CHOLECYSTECTOMY     CORONARY STENT  INTERVENTION     INSERT / REPLACE / REMOVE PACEMAKER  12/16/2018   no surgical hx     PACEMAKER IMPLANT N/A 12/16/2018   Procedure: PACEMAKER IMPLANT;  Surgeon: Marinus Maw, MD;  Location: [facility] IN[facility];  Service: Cardiovascular;  Laterality: N/A;    Social:  Lives With: At home with husband and daughters Occupation: Retired from an unspecified office job Support: Husband and 2 daughters  Level of Function:  PCP: Osborne Oman MD at [facility] [place] Substances: 2-3 packs per day for 30-40 years. Denies alcohol or illicit drugs   Family History: Mother: goiter and seizures  Father: died of MI  Allergies: Allergies as of 07/03/2023 - Review Complete 07/03/2023  Allergen Reaction Noted   Darvon [propoxyphene] Nausea And Vomiting 01/10/2016   Sulfa antibiotics Nausea And Vomiting 12/14/2011    Review of Systems: A complete ROS was negative except as per HPI.   OBJECTIVE:   Physical Exam: Blood pressure (!) 148/75, pulse 75, temperature 98 F (36.7 C), temperature source Oral, resp. rate 17, height 5\' 2"  (1.575 m), weight 52.2 kg, SpO2 97%.    Constitutional: Frail woman ,laying in bed comfortably HENT: No signs of trauma to the head or facial bruising  Cardiovascular: regular rate and rhythm, no m/r/g Pulmonary/Chest: normal work of breathing on room air, lungs clear to auscultation bilaterally Abdominal Exam: no guarding, nontender, no rebound tenderness. MSK: Nodes on L arm -- related to insuline shots, per family. Neurological: Non fluent speech.CN III: reactive.CN V: normal sensation:CN XI: intact.Patient responds "yes" when asked where she is. Could not say full name or name of daughter. Smiles but does not answer. Begins to form words. Can clap hands and wiggle toes. Sensation intact in BL lower extremitiees. Skin: warm and dry  Labs: CBC    Component Value Date/Time   WBC 7.7 07/03/2023 1402   RBC 3.94 07/03/2023 1402   HGB 13.3 07/03/2023 1422    HGB 11.3 12/09/2018 1308   HCT 39.0 07/03/2023 1422   HCT 33.1 (L) 12/09/2018 1308   PLT 429 (H) 07/03/2023 1402   PLT 52 (LL) 12/09/2018 1308   MCV 90.4 07/03/2023 1402   MCV 92 12/09/2018 1308   MCH 29.2 07/03/2023 1402   MCHC 32.3 07/03/2023 1402   RDW 12.6 07/03/2023 1402   RDW 12.2 12/09/2018 1308   LYMPHSABS 1.6 07/03/2023 1402   LYMPHSABS 2.0 12/09/2018 1308   MONOABS 0.7 07/03/2023 1402   EOSABS 0.3 07/03/2023 1402   EOSABS 0.2 12/09/2018 1308   BASOSABS 0.1 07/03/2023 1402   BASOSABS 0.0 12/09/2018 1308     CMP     Component Value Date/Time   NA 142 07/03/2023 1422   NA 143 12/09/2018 1308   K 4.1 07/03/2023 1422   CL 103 07/03/2023 1422   CO2 28 07/03/2023 1402   GLUCOSE 86  07/03/2023 1422   BUN 11 07/03/2023 1422   BUN 8 12/09/2018 1308   CREATININE 0.80 07/03/2023 1422   CREATININE 0.73 08/03/2016 1519   CALCIUM 9.0 07/03/2023 1402   PROT 6.4 (L) 07/03/2023 1402   ALBUMIN 3.3 (L) 07/03/2023 1402   AST 24 07/03/2023 1402   ALT 19 07/03/2023 1402   ALKPHOS 87 07/03/2023 1402   BILITOT 0.6 07/03/2023 1402   GFRNONAA >60 07/03/2023 1402   GFRAA >60 01/31/2020 1202    Imaging:  CT HEAD showed Brain: No acute hemorrhage. Stable mild chronic small-vessel disease with old lacunar infarcts in the left caudate and right thalamus.Cortical gray-white differentiation is otherwise preserved.Prominence of the ventricles and sulci within expected range for age. No hydrocephalus or extra-axial collection. No mass effect or midline shift.   ASSESSMENT & PLAN:   Assessment & Plan by Problem: Principal Problem:   Altered mental state   Madeline [name] is a 89 y.o. person living with a history of CAD pacemaker diabetes hyperlipidemia hypertension and COPD who presented with confusion after a fall and admitted for mental status on hospital day 0  # Altered mental status of unspecified etiology Patient is a 89 year old female who had an acute mental status change  after her daughter found her sitting on the floor in her bed room.  Patient's daughter reported the patient exhibited selective mutism and would answer some questions and just stare at her which is very unusual. On exam, patient appears aphasic.CT head did not reveal any acute intracranial abnormalities, or any sign of skull fracture. Less suspicion of traumatic cause of altered mental status at this time. Patient has a hx of stent placement with a pacemaker ,Medtronic pacemaker interrogated and there was no evidence pacemaker dysfunctioning,however syncope induced AMS cannot be ruled out entirely. CXR also didn't show any evidence of acute cardiopulmonary process. Patient was afebrile on presentation and no signs of nuchal rigidity,less concern for meningitis or encephalitis.  I wonder if this altered mental status could be due to metabolic derangements in the setting of hypoglycemia. Patient did receive her regular dose of insulin in the morning after breakfast , blood glucose on presentation was 86. -  F/U on EEG read -  F/U on Echo -  F/U on MRI -  TSH   #Chronic Conditions HLD - Continue home Lipitor 20 mg HTN - Continue Lisinopril 20 mg Diabetes -  Adjust SSI  Hypothyroidism- Continue home Levothyroxine 75 mcg  Thrombocytopenia- Continue home Promacta   Diet: Normal VTE: Enoxaparin IVF: None,None Code: Full  Prior to Admission Living Arrangement: Home, living with husband and Daughters  Anticipated Discharge Location: Home Barriers to Discharge: Medical work up   Dispo: Admit patient to Inpatient with expected length of stay greater than 2 midnights.  Signed: Kathleen Lime, MD Internal Medicine Resident PGY-1  07/03/2023, 5:13 PM

## 2023-07-03 NOTE — ED Provider Notes (Signed)
[place] EMERGENCY DEPARTMENT AT [facility] Provider Note   CSN: [id number] Arrival date & time: 07/03/23  1356     History  Chief Complaint  Patient presents with   Altered Mental Status   Fall    Madeline [name] is a 89 y.o. female.  Patient here with altered mental status after maybe a fall.  Overall level 5 caveat as patient appears to be aphasic/dysarthric.  EMS states that she was at her baseline about 3 hours ago.  Daughter gave her insulin and then about 30 minutes later she was found sitting on the floor stuck in between her bed and her dresser.  Patient is normally alert and oriented and ambulatory but was unable to sit up and stand up.  Ultimately I made patient a code stroke upon arrival as she appeared to be aphasic not moving the right side is much maybe with some neglect.  Vital signs normal with EMS.  Blood sugar was normal.  She is not on a blood thinner.  History of diabetes, reflux.  Has pacemaker.  Has history of CAD.  The history is provided by the patient.       Home Medications Prior to Admission medications   Medication Sig Start Date End Date Taking? Authorizing Provider  acetaminophen (TYLENOL) 325 MG tablet Take 1-2 tablets (325-650 mg total) by mouth every 6 (six) hours as needed for mild pain or moderate pain. 12/17/18   Barrett, Joline Salt, PA-C  atorvastatin (LIPITOR) 20 MG tablet Take 1 tablet (20 mg total) by mouth daily at 6 PM. 12/09/20   Pricilla Riffle, MD  citalopram (CELEXA) 20 MG tablet Take 20 mg by mouth daily.    [provider]  eltrombopag (PROMACTA) 25 MG tablet Take 25 mg by mouth daily.    [provider]  insulin aspart (NOVOLOG) 100 UNIT/ML injection Inject 0-15 Units into the skin 3 (three) times daily with meals. 04/17/23   Burnadette Pop, MD  insulin aspart (NOVOLOG) 100 UNIT/ML injection Inject 5 Units into the skin 3 (three) times daily with meals. Hold if patient takes less than 50% of the meals 04/17/23    Burnadette Pop, MD  insulin glargine-yfgn (SEMGLEE) 100 UNIT/ML injection Inject 0.2 mLs (20 Units total) into the skin daily. 04/17/23   Burnadette Pop, MD  insulin glargine-yfgn (SEMGLEE) 100 UNIT/ML injection Inject 0.05 mLs (5 Units total) into the skin at bedtime. 04/18/23   Burnadette Pop, MD  Insulin Syringes, Disposable, U-100 0.3 ML MISC 100 each by Does not apply route daily. 10/05/22   Kathlen Mody, MD  levothyroxine (SYNTHROID) 100 MCG tablet Take 100 mcg by mouth daily before breakfast.    [provider]  lisinopril (PRINIVIL,ZESTRIL) 20 MG tablet Take 1 tablet (20 mg total) by mouth daily. Patient taking differently: Take 10 mg by mouth daily. 09/22/18 05/22/23  Houston Siren, MD  Multiple Vitamin (MULTIVITAMIN WITH MINERALS) TABS tablet Take 1 tablet by mouth daily. 04/18/23   Burnadette Pop, MD  nicotine (NICODERM CQ - DOSED IN MG/24 HOURS) 14 mg/24hr patch Place 1 patch (14 mg total) onto the skin daily. 04/18/23   Burnadette Pop, MD  polyethylene glycol (MIRALAX / GLYCOLAX) 17 g packet Take 17 g by mouth daily as needed for moderate constipation. 04/17/23   Burnadette Pop, MD      Allergies    Darvon [propoxyphene] and Sulfa antibiotics    Review of Systems   Review of Systems  Physical Exam Updated Vital  Signs BP (!) 148/75   Pulse 75   Temp 98 F (36.7 C) (Oral)   Resp 17   Ht 5\' 2"  (1.575 m)   Wt 52.2 kg   SpO2 97%   BMI 21.03 kg/m  Physical Exam Vitals and nursing note reviewed.  Constitutional:      General: She is not in acute distress.    Appearance: She is well-developed. She is ill-appearing.  HENT:     Head: Normocephalic and atraumatic.     Nose: Nose normal.     Mouth/Throat:     Mouth: Mucous membranes are moist.  Eyes:     Extraocular Movements: Extraocular movements intact.     Conjunctiva/sclera: Conjunctivae normal.     Pupils: Pupils are equal, round, and reactive to light.  Cardiovascular:     Rate and Rhythm: Normal rate and  regular rhythm.     Pulses: Normal pulses.     Heart sounds: Normal heart sounds. No murmur heard. Pulmonary:     Effort: Pulmonary effort is normal. No respiratory distress.     Breath sounds: Normal breath sounds.  Abdominal:     General: Abdomen is flat.     Palpations: Abdomen is soft.     Tenderness: There is no abdominal tenderness.  Musculoskeletal:        General: No swelling.     Cervical back: Neck supple.  Skin:    General: Skin is warm and dry.     Capillary Refill: Capillary refill takes less than 2 seconds.  Neurological:     Mental Status: She is alert.     Comments: She appears to have maybe some aphasia/dysarthria, maybe a little bit of weakness in the right upper arm, really hard to get her to follow commands, cannot test sensation, no obvious neglect  Psychiatric:        Mood and Affect: Mood normal.     ED Results / Procedures / Treatments   Labs (all labs ordered are listed, but only abnormal results are displayed) Labs Reviewed  CBC - Abnormal; Notable for the following components:      Result Value   Hemoglobin 11.5 (*)    HCT 35.6 (*)    Platelets 429 (*)    All other components within normal limits  COMPREHENSIVE METABOLIC PANEL - Abnormal; Notable for the following components:   Total Protein 6.4 (*)    Albumin 3.3 (*)    All other components within normal limits  CBG MONITORING, ED - Abnormal; Notable for the following components:   Glucose-Capillary 122 (*)    All other components within normal limits  ETHANOL  PROTIME-INR  APTT  DIFFERENTIAL  RAPID URINE DRUG SCREEN, HOSP PERFORMED  URINALYSIS, ROUTINE W REFLEX MICROSCOPIC  CBG MONITORING, ED  I-STAT CHEM 8, ED  TROPONIN I (HIGH SENSITIVITY)    EKG None  Radiology CT CERVICAL SPINE WO CONTRAST  Result Date: 07/03/2023 CLINICAL DATA:  Fall.  Altered mental status.  Found down. EXAM: CT CERVICAL SPINE WITHOUT CONTRAST TECHNIQUE: Multidetector CT imaging of the cervical spine was  performed without intravenous contrast. Multiplanar CT image reconstructions were also generated. RADIATION DOSE REDUCTION: This exam was performed according to the departmental dose-optimization program which includes automated exposure control, adjustment of the mA and/or kV according to patient size and/or use of iterative reconstruction technique. COMPARISON:  CT cervical spine 04/16/2022. FINDINGS: Alignment: Unchanged 3 mm retrolisthesis of C4 on C5. No traumatic malalignment. Skull base and vertebrae: No acute fracture. Normal  craniocervical junction. No suspicious bone lesions. Multilevel degenerative endplate changes. Soft tissues and spinal canal: No prevertebral fluid or swelling. No visible canal hematoma. Disc levels: Multilevel cervical spondylosis, worst at C4-5, where there is mild spinal canal stenosis. Upper chest: No acute findings. Other: Atherosclerotic calcifications of the carotid bulbs. IMPRESSION: 1. No acute fracture or traumatic malalignment of the cervical spine. 2. Multilevel cervical spondylosis, worst at C4-5, where there is mild spinal canal stenosis. Electronically Signed   By: Orvan Falconer M.D.   On: 07/03/2023 14:56   CT HEAD CODE STROKE WO CONTRAST  Result Date: 07/03/2023 CLINICAL DATA:  Code stroke. Neuro deficit, acute, stroke suspected. Right-sided weakness. Aphasia. EXAM: CT HEAD WITHOUT CONTRAST TECHNIQUE: Contiguous axial images were obtained from the base of the skull through the vertex without intravenous contrast. RADIATION DOSE REDUCTION: This exam was performed according to the departmental dose-optimization program which includes automated exposure control, adjustment of the mA and/or kV according to patient size and/or use of iterative reconstruction technique. COMPARISON:  Head CT 04/30/2023. FINDINGS: Brain: No acute hemorrhage. Stable mild chronic small-vessel disease with old lacunar infarcts in the left caudate and right thalamus. Cortical gray-white  differentiation is otherwise preserved. Prominence of the ventricles and sulci within expected range for age. No hydrocephalus or extra-axial collection. No mass effect or midline shift. Vascular: No hyperdense vessel or unexpected calcification. Skull: No calvarial fracture or suspicious bone lesion. Skull base is unremarkable. Sinuses/Orbits: No acute finding. Other: None. ASPECTS [facility] Stroke Program Early CT Score) - Ganglionic level infarction (caudate, lentiform nuclei, internal capsule, insula, M1-M3 cortex): 7 - Supraganglionic infarction (M4-M6 cortex): 3 Total score (0-10 with 10 being normal): 10 IMPRESSION: 1. No acute intracranial hemorrhage or evidence of acute large vessel territory infarct. ASPECT score is 10. 2. Stable mild chronic small-vessel disease with old lacunar infarcts in the left caudate and right thalamus. Code stroke imaging results were communicated on 07/03/2023 at 2:36 pm to provider Dr. Selina Cooley via secure text paging. Electronically Signed   By: Orvan Falconer M.D.   On: 07/03/2023 14:36    Procedures Procedures    Medications Ordered in ED Medications - No data to display  ED Course/ Medical Decision Making/ A&P                                 Medical Decision Making Amount and/or Complexity of Data Reviewed Labs: ordered. Radiology: ordered.  Risk Decision regarding hospitalization.   Kailer Maclin Tanton is here with altered mental status.  Normal vitals.  No fever.  Code stroke enacted upon arrival.  Last known normal about 3 hours ago.  She appears to be aphasic, dysarthric may be with some right-sided neglect, not moving the right arm is much as.  Dr. Selina Cooley came and evaluated the patient.  She had negative head imaging.  CT of the neck was unremarkable.  Basic labs were obtained that showed no significant anemia, electrolyte abnormality, leukocytosis.  EKG shows sinus rhythm.  No ischemic changes.  Lab work overall thus far unremarkable.  Differential  diagnosis could be stroke versus seizure versus cardiogenic process.  Troponin has been added.  She is getting an EEG will get an MRI and will have her Medtronic pacemaker interrogated.  She seems to be following commands better on reevaluation.  Family updated with the plan.  Overall we will admit for syncope workup to further evaluate for cardiogenic/neurogenic process.  Blood sugars been  normal.  Overall plan to admit to medicine for further care.  Neurology to follow.  This chart was dictated using voice recognition software.  Despite best efforts to proofread,  errors can occur which can change the documentation meaning.         Final Clinical Impression(s) / ED Diagnoses Final diagnoses:  Altered mental status, unspecified altered mental status type  Syncope, unspecified syncope type    Rx / DC Orders ED Discharge Orders     None         Virgina Norfolk, DO 07/03/23 1541

## 2023-07-03 NOTE — ED Notes (Signed)
EEG completed.   Medtronic pacemaker interrogated.

## 2023-07-04 ENCOUNTER — Observation Stay (HOSPITAL_COMMUNITY): Payer: PPO

## 2023-07-04 ENCOUNTER — Inpatient Hospital Stay (HOSPITAL_COMMUNITY): Payer: PPO

## 2023-07-04 ENCOUNTER — Observation Stay (HOSPITAL_COMMUNITY): Payer: PPO | Admitting: Anesthesiology

## 2023-07-04 ENCOUNTER — Encounter (HOSPITAL_COMMUNITY)
Admission: EM | Disposition: A | Discharge status: Hospice | Payer: Self-pay | Source: Home / Self Care | Attending: Neurology

## 2023-07-04 ENCOUNTER — Encounter (HOSPITAL_COMMUNITY): Payer: Self-pay | Admitting: Internal Medicine

## 2023-07-04 DIAGNOSIS — E1165 Type 2 diabetes mellitus with hyperglycemia: Secondary | ICD-10-CM | POA: Diagnosis not present

## 2023-07-04 DIAGNOSIS — R4182 Altered mental status, unspecified: Secondary | ICD-10-CM | POA: Diagnosis not present

## 2023-07-04 DIAGNOSIS — G8191 Hemiplegia, unspecified affecting right dominant side: Secondary | ICD-10-CM | POA: Diagnosis not present

## 2023-07-04 DIAGNOSIS — M47812 Spondylosis without myelopathy or radiculopathy, cervical region: Secondary | ICD-10-CM | POA: Diagnosis not present

## 2023-07-04 DIAGNOSIS — D693 Immune thrombocytopenic purpura: Secondary | ICD-10-CM | POA: Diagnosis not present

## 2023-07-04 DIAGNOSIS — E039 Hypothyroidism, unspecified: Secondary | ICD-10-CM | POA: Diagnosis not present

## 2023-07-04 DIAGNOSIS — R509 Fever, unspecified: Secondary | ICD-10-CM | POA: Diagnosis not present

## 2023-07-04 DIAGNOSIS — I63512 Cerebral infarction due to unspecified occlusion or stenosis of left middle cerebral artery: Secondary | ICD-10-CM | POA: Diagnosis not present

## 2023-07-04 DIAGNOSIS — R4701 Aphasia: Secondary | ICD-10-CM | POA: Diagnosis not present

## 2023-07-04 DIAGNOSIS — I6782 Cerebral ischemia: Secondary | ICD-10-CM | POA: Diagnosis not present

## 2023-07-04 DIAGNOSIS — I10 Essential (primary) hypertension: Secondary | ICD-10-CM | POA: Diagnosis not present

## 2023-07-04 DIAGNOSIS — R29818 Other symptoms and signs involving the nervous system: Secondary | ICD-10-CM | POA: Diagnosis not present

## 2023-07-04 DIAGNOSIS — R400 Somnolence: Secondary | ICD-10-CM | POA: Diagnosis not present

## 2023-07-04 DIAGNOSIS — Z9911 Dependence on respirator [ventilator] status: Secondary | ICD-10-CM

## 2023-07-04 DIAGNOSIS — F17210 Nicotine dependence, cigarettes, uncomplicated: Secondary | ICD-10-CM | POA: Diagnosis not present

## 2023-07-04 DIAGNOSIS — R29703 NIHSS score 3: Secondary | ICD-10-CM | POA: Diagnosis not present

## 2023-07-04 DIAGNOSIS — I771 Stricture of artery: Secondary | ICD-10-CM | POA: Diagnosis not present

## 2023-07-04 DIAGNOSIS — I2510 Atherosclerotic heart disease of native coronary artery without angina pectoris: Secondary | ICD-10-CM | POA: Diagnosis not present

## 2023-07-04 DIAGNOSIS — Z7189 Other specified counseling: Secondary | ICD-10-CM | POA: Diagnosis not present

## 2023-07-04 DIAGNOSIS — I63522 Cerebral infarction due to unspecified occlusion or stenosis of left anterior cerebral artery: Secondary | ICD-10-CM | POA: Diagnosis not present

## 2023-07-04 DIAGNOSIS — I69351 Hemiplegia and hemiparesis following cerebral infarction affecting right dominant side: Secondary | ICD-10-CM | POA: Diagnosis not present

## 2023-07-04 DIAGNOSIS — K219 Gastro-esophageal reflux disease without esophagitis: Secondary | ICD-10-CM | POA: Diagnosis not present

## 2023-07-04 DIAGNOSIS — G936 Cerebral edema: Secondary | ICD-10-CM | POA: Diagnosis not present

## 2023-07-04 DIAGNOSIS — I6619 Occlusion and stenosis of unspecified anterior cerebral artery: Secondary | ICD-10-CM | POA: Diagnosis not present

## 2023-07-04 DIAGNOSIS — R55 Syncope and collapse: Secondary | ICD-10-CM | POA: Diagnosis not present

## 2023-07-04 DIAGNOSIS — M009 Pyogenic arthritis, unspecified: Secondary | ICD-10-CM | POA: Diagnosis not present

## 2023-07-04 DIAGNOSIS — E876 Hypokalemia: Secondary | ICD-10-CM | POA: Diagnosis not present

## 2023-07-04 DIAGNOSIS — I6612 Occlusion and stenosis of left anterior cerebral artery: Secondary | ICD-10-CM

## 2023-07-04 DIAGNOSIS — R29712 NIHSS score 12: Secondary | ICD-10-CM | POA: Diagnosis not present

## 2023-07-04 DIAGNOSIS — G319 Degenerative disease of nervous system, unspecified: Secondary | ICD-10-CM | POA: Diagnosis not present

## 2023-07-04 DIAGNOSIS — I517 Cardiomegaly: Secondary | ICD-10-CM | POA: Diagnosis not present

## 2023-07-04 DIAGNOSIS — J449 Chronic obstructive pulmonary disease, unspecified: Secondary | ICD-10-CM

## 2023-07-04 DIAGNOSIS — F940 Selective mutism: Secondary | ICD-10-CM | POA: Diagnosis not present

## 2023-07-04 DIAGNOSIS — R569 Unspecified convulsions: Secondary | ICD-10-CM | POA: Diagnosis not present

## 2023-07-04 DIAGNOSIS — I6529 Occlusion and stenosis of unspecified carotid artery: Secondary | ICD-10-CM | POA: Diagnosis not present

## 2023-07-04 DIAGNOSIS — M50221 Other cervical disc displacement at C4-C5 level: Secondary | ICD-10-CM | POA: Diagnosis not present

## 2023-07-04 DIAGNOSIS — R251 Tremor, unspecified: Secondary | ICD-10-CM | POA: Diagnosis not present

## 2023-07-04 DIAGNOSIS — Z515 Encounter for palliative care: Secondary | ICD-10-CM | POA: Diagnosis not present

## 2023-07-04 DIAGNOSIS — W19XXXA Unspecified fall, initial encounter: Secondary | ICD-10-CM | POA: Diagnosis present

## 2023-07-04 DIAGNOSIS — I700 Atherosclerosis of aorta: Secondary | ICD-10-CM | POA: Diagnosis not present

## 2023-07-04 DIAGNOSIS — R531 Weakness: Secondary | ICD-10-CM | POA: Diagnosis not present

## 2023-07-04 DIAGNOSIS — R471 Dysarthria and anarthria: Secondary | ICD-10-CM | POA: Diagnosis not present

## 2023-07-04 DIAGNOSIS — R0609 Other forms of dyspnea: Secondary | ICD-10-CM | POA: Diagnosis not present

## 2023-07-04 DIAGNOSIS — I63523 Cerebral infarction due to unspecified occlusion or stenosis of bilateral anterior cerebral arteries: Secondary | ICD-10-CM | POA: Diagnosis not present

## 2023-07-04 DIAGNOSIS — Z794 Long term (current) use of insulin: Secondary | ICD-10-CM | POA: Diagnosis not present

## 2023-07-04 DIAGNOSIS — I442 Atrioventricular block, complete: Secondary | ICD-10-CM | POA: Diagnosis not present

## 2023-07-04 DIAGNOSIS — Z950 Presence of cardiac pacemaker: Secondary | ICD-10-CM | POA: Diagnosis not present

## 2023-07-04 DIAGNOSIS — Z66 Do not resuscitate: Secondary | ICD-10-CM | POA: Diagnosis not present

## 2023-07-04 DIAGNOSIS — E11649 Type 2 diabetes mellitus with hypoglycemia without coma: Secondary | ICD-10-CM | POA: Diagnosis not present

## 2023-07-04 DIAGNOSIS — I639 Cerebral infarction, unspecified: Secondary | ICD-10-CM | POA: Diagnosis not present

## 2023-07-04 DIAGNOSIS — I672 Cerebral atherosclerosis: Secondary | ICD-10-CM | POA: Diagnosis not present

## 2023-07-04 DIAGNOSIS — E785 Hyperlipidemia, unspecified: Secondary | ICD-10-CM | POA: Diagnosis not present

## 2023-07-04 DIAGNOSIS — Z4682 Encounter for fitting and adjustment of non-vascular catheter: Secondary | ICD-10-CM | POA: Diagnosis not present

## 2023-07-04 DIAGNOSIS — I050 Rheumatic mitral stenosis: Secondary | ICD-10-CM | POA: Diagnosis not present

## 2023-07-04 DIAGNOSIS — J9600 Acute respiratory failure, unspecified whether with hypoxia or hypercapnia: Secondary | ICD-10-CM | POA: Diagnosis not present

## 2023-07-04 HISTORY — PX: IR CT HEAD LTD: IMG2386

## 2023-07-04 HISTORY — PX: IR PERCUTANEOUS ART THROMBECTOMY/INFUSION INTRACRANIAL INC DIAG ANGIO: IMG6087

## 2023-07-04 HISTORY — PX: RADIOLOGY WITH ANESTHESIA: SHX6223

## 2023-07-04 LAB — POCT I-STAT 7, (LYTES, BLD GAS, ICA,H+H)
Acid-base deficit: 8 mmol/L — ABNORMAL HIGH (ref 0.0–2.0)
Bicarbonate: 17.8 mmol/L — ABNORMAL LOW (ref 20.0–28.0)
Calcium, Ion: 1.14 mmol/L — ABNORMAL LOW (ref 1.15–1.40)
HCT: 31 % — ABNORMAL LOW (ref 36.0–46.0)
Hemoglobin: 10.5 g/dL — ABNORMAL LOW (ref 12.0–15.0)
O2 Saturation: 100 %
Patient temperature: 95
Potassium: 3.7 mmol/L (ref 3.5–5.1)
Sodium: 139 mmol/L (ref 135–145)
TCO2: 19 mmol/L — ABNORMAL LOW (ref 22–32)
pCO2 arterial: 32.5 mmHg (ref 32–48)
pH, Arterial: 7.335 — ABNORMAL LOW (ref 7.35–7.45)
pO2, Arterial: 489 mmHg — ABNORMAL HIGH (ref 83–108)

## 2023-07-04 LAB — RAPID URINE DRUG SCREEN, HOSP PERFORMED
Amphetamines: NOT DETECTED
Barbiturates: NOT DETECTED
Benzodiazepines: NOT DETECTED
Cocaine: NOT DETECTED
Opiates: NOT DETECTED
Tetrahydrocannabinol: NOT DETECTED

## 2023-07-04 LAB — BASIC METABOLIC PANEL
Anion gap: 12 (ref 5–15)
BUN: 12 mg/dL (ref 8–23)
CO2: 23 mmol/L (ref 22–32)
Calcium: 8.8 mg/dL — ABNORMAL LOW (ref 8.9–10.3)
Chloride: 101 mmol/L (ref 98–111)
Creatinine, Ser: 0.84 mg/dL (ref 0.44–1.00)
GFR, Estimated: >60 mL/min (ref >60)
Glucose, Bld: 304 mg/dL — ABNORMAL HIGH (ref 70–99)
Potassium: 4.1 mmol/L (ref 3.5–5.1)
Sodium: 136 mmol/L (ref 135–145)

## 2023-07-04 LAB — URINALYSIS, ROUTINE W REFLEX MICROSCOPIC
Bacteria, UA: NONE SEEN
Bilirubin Urine: NEGATIVE
Glucose, UA: >=500 mg/dL — AB
Hgb urine dipstick: NEGATIVE
Ketones, ur: 80 mg/dL — AB
Leukocytes,Ua: NEGATIVE
Nitrite: NEGATIVE
Protein, ur: NEGATIVE mg/dL
Specific Gravity, Urine: 1.042 — ABNORMAL HIGH (ref 1.005–1.030)
pH: 6 (ref 5.0–8.0)

## 2023-07-04 LAB — CBC
HCT: 37.5 % (ref 36.0–46.0)
Hemoglobin: 12.2 g/dL (ref 12.0–15.0)
MCH: 29.3 pg (ref 26.0–34.0)
MCHC: 32.5 g/dL (ref 30.0–36.0)
MCV: 89.9 fL (ref 80.0–100.0)
Platelets: 422 10*3/uL — ABNORMAL HIGH (ref 150–400)
RBC: 4.17 MIL/uL (ref 3.87–5.11)
RDW: 12.6 % (ref 11.5–15.5)
WBC: 7.2 10*3/uL (ref 4.0–10.5)
nRBC: 0 % (ref 0.0–0.2)

## 2023-07-04 LAB — GLUCOSE, CAPILLARY
Glucose-Capillary: 254 mg/dL — ABNORMAL HIGH (ref 70–99)
Glucose-Capillary: 204 mg/dL — ABNORMAL HIGH (ref 70–99)
Glucose-Capillary: 259 mg/dL — ABNORMAL HIGH (ref 70–99)

## 2023-07-04 LAB — CBG MONITORING, ED
Glucose-Capillary: 296 mg/dL — ABNORMAL HIGH (ref 70–99)
Glucose-Capillary: 216 mg/dL — ABNORMAL HIGH (ref 70–99)

## 2023-07-04 LAB — MAGNESIUM: Magnesium: 2.1 mg/dL (ref 1.7–2.4)

## 2023-07-04 LAB — MRSA NEXT GEN BY PCR, NASAL: MRSA by PCR Next Gen: NOT DETECTED

## 2023-07-04 LAB — PHOSPHORUS: Phosphorus: 4.2 mg/dL (ref 2.5–4.6)

## 2023-07-04 LAB — TSH: TSH: 0.306 u[IU]/mL — ABNORMAL LOW (ref 0.350–4.500)

## 2023-07-04 LAB — TROPONIN I (HIGH SENSITIVITY): Troponin I (High Sensitivity): 16 ng/L (ref <18)

## 2023-07-04 SURGERY — IR WITH ANESTHESIA
Anesthesia: General

## 2023-07-04 MED ORDER — LIDOCAINE HCL (CARDIAC) PF 100 MG/5ML IV SOSY
PREFILLED_SYRINGE | INTRAVENOUS | Status: DC | PRN
  Administered 2023-07-04: 100 mg via INTRATRACHEAL

## 2023-07-04 MED ORDER — INSULIN ASPART 100 UNIT/ML IJ SOLN
0.0000 [IU] | INTRAMUSCULAR | Status: DC
  Administered 2023-07-04: 3 [IU] via SUBCUTANEOUS
  Administered 2023-07-04 – 2023-07-05 (×2): 5 [IU] via SUBCUTANEOUS
  Administered 2023-07-05: 3 [IU] via SUBCUTANEOUS

## 2023-07-04 MED ORDER — ACETAMINOPHEN 650 MG RE SUPP
650.0000 mg | RECTAL | Status: DC | PRN
  Administered 2023-07-10 – 2023-07-12 (×3): 650 mg via RECTAL
  Filled 2023-07-04 (×3): qty 1

## 2023-07-04 MED ORDER — ACETAMINOPHEN 160 MG/5ML PO SOLN
650.0000 mg | ORAL | Status: DC | PRN
  Administered 2023-07-05 – 2023-07-09 (×7): 650 mg
  Filled 2023-07-04 (×7): qty 20.3

## 2023-07-04 MED ORDER — ASPIRIN 81 MG PO CHEW
CHEWABLE_TABLET | ORAL | Status: AC
  Filled 2023-07-04: qty 1

## 2023-07-04 MED ORDER — ASPIRIN 300 MG RE SUPP: 300.0000 mg | Freq: Every day | RECTAL | Status: DC

## 2023-07-04 MED ORDER — IOHEXOL 350 MG/ML SOLN
100.0000 mL | Freq: Once | INTRAVENOUS | Status: AC | PRN
  Administered 2023-07-04: 100 mL via INTRAVENOUS

## 2023-07-04 MED ORDER — ORAL CARE MOUTH RINSE: 15.0000 mL | OROMUCOSAL | Status: DC | PRN

## 2023-07-04 MED ORDER — CHLORHEXIDINE GLUCONATE CLOTH 2 % EX PADS
6.0000 | MEDICATED_PAD | Freq: Every day | CUTANEOUS | Status: DC
  Administered 2023-07-04 – 2023-07-11 (×9): 6 via TOPICAL

## 2023-07-04 MED ORDER — ACETAMINOPHEN 325 MG PO TABS: 650.0000 mg | ORAL_TABLET | ORAL | Status: DC | PRN

## 2023-07-04 MED ORDER — CLEVIDIPINE BUTYRATE 0.5 MG/ML IV EMUL
0.0000 mg/h | INTRAVENOUS | Status: DC
  Administered 2023-07-04 – 2023-07-05 (×3): 2 mg/h via INTRAVENOUS
  Filled 2023-07-04 (×2): qty 50

## 2023-07-04 MED ORDER — SODIUM CHLORIDE 0.9 % IV SOLN: INTRAVENOUS | Status: DC

## 2023-07-04 MED ORDER — PROPOFOL 10 MG/ML IV BOLUS
INTRAVENOUS | Status: DC | PRN
  Administered 2023-07-04: 120 mg via INTRAVENOUS

## 2023-07-04 MED ORDER — FENTANYL CITRATE PF 50 MCG/ML IJ SOSY
25.0000 ug | PREFILLED_SYRINGE | INTRAMUSCULAR | Status: DC | PRN
  Administered 2023-07-04: 25 ug via INTRAVENOUS
  Administered 2023-07-07 – 2023-07-09 (×4): 50 ug via INTRAVENOUS
  Filled 2023-07-04 (×3): qty 1
  Filled 2023-07-04: qty 2
  Filled 2023-07-04 (×2): qty 1

## 2023-07-04 MED ORDER — NITROGLYCERIN 1 MG/10 ML FOR IR/CATH LAB
INTRA_ARTERIAL | Status: AC
  Filled 2023-07-04: qty 10

## 2023-07-04 MED ORDER — SODIUM CHLORIDE 0.9 % IV SOLN: INTRAVENOUS | Status: DC | PRN

## 2023-07-04 MED ORDER — FENTANYL CITRATE PF 50 MCG/ML IJ SOSY
25.0000 ug | PREFILLED_SYRINGE | INTRAMUSCULAR | Status: DC | PRN
  Filled 2023-07-04: qty 1

## 2023-07-04 MED ORDER — DOCUSATE SODIUM 50 MG/5ML PO LIQD: 100.0000 mg | Freq: Two times a day (BID) | ORAL | Status: DC

## 2023-07-04 MED ORDER — ASPIRIN 81 MG PO CHEW: 81.0000 mg | CHEWABLE_TABLET | Freq: Every day | ORAL | Status: DC

## 2023-07-04 MED ORDER — DOCUSATE SODIUM 50 MG/5ML PO LIQD
100.0000 mg | Freq: Two times a day (BID) | ORAL | Status: DC
  Administered 2023-07-05 – 2023-07-09 (×10): 100 mg
  Filled 2023-07-04 (×10): qty 10

## 2023-07-04 MED ORDER — TICAGRELOR 90 MG PO TABS
ORAL_TABLET | ORAL | Status: AC
  Filled 2023-07-04: qty 1

## 2023-07-04 MED ORDER — PROPOFOL 1000 MG/100ML IV EMUL
0.0000 ug/kg/min | INTRAVENOUS | Status: DC
  Administered 2023-07-04: 50 ug/kg/min via INTRAVENOUS
  Administered 2023-07-04: 30 ug/kg/min via INTRAVENOUS
  Administered 2023-07-05: 10 ug/kg/min via INTRAVENOUS
  Administered 2023-07-05 – 2023-07-06 (×2): 5 ug/kg/min via INTRAVENOUS
  Filled 2023-07-04 (×5): qty 100

## 2023-07-04 MED ORDER — PANTOPRAZOLE SODIUM 40 MG IV SOLR
40.0000 mg | Freq: Every day | INTRAVENOUS | Status: DC
  Administered 2023-07-04 – 2023-07-09 (×6): 40 mg via INTRAVENOUS
  Filled 2023-07-04 (×6): qty 10

## 2023-07-04 MED ORDER — IOHEXOL 300 MG/ML  SOLN
50.0000 mL | Freq: Once | INTRAMUSCULAR | Status: AC | PRN
  Administered 2023-07-05: 5 mL via INTRA_ARTERIAL

## 2023-07-04 MED ORDER — ROCURONIUM BROMIDE 100 MG/10ML IV SOLN
INTRAVENOUS | Status: DC | PRN
Start: 2023-07-04 — End: 2023-07-04
  Administered 2023-07-04: 30 mg via INTRAVENOUS
  Administered 2023-07-04: 50 mg via INTRAVENOUS
  Administered 2023-07-04 (×2): 10 mg via INTRAVENOUS

## 2023-07-04 MED ORDER — STROKE: EARLY STAGES OF RECOVERY BOOK: Freq: Once | Status: DC

## 2023-07-04 MED ORDER — SUCCINYLCHOLINE CHLORIDE 200 MG/10ML IV SOSY
PREFILLED_SYRINGE | INTRAVENOUS | Status: DC | PRN
  Administered 2023-07-04: 100 mg via INTRAVENOUS

## 2023-07-04 MED ORDER — LIDOCAINE HCL 1 % IJ SOLN
INTRAMUSCULAR | Status: AC
  Filled 2023-07-04: qty 20

## 2023-07-04 MED ORDER — LORAZEPAM 2 MG/ML IJ SOLN
1.0000 mg | Freq: Once | INTRAMUSCULAR | Status: AC
  Administered 2023-07-04: 1 mg via INTRAVENOUS
  Filled 2023-07-04: qty 1

## 2023-07-04 MED ORDER — PROPOFOL 500 MG/50ML IV EMUL
INTRAVENOUS | Status: DC | PRN
Start: 2023-07-04 — End: 2023-07-04
  Administered 2023-07-04: 75 ug/kg/min via INTRAVENOUS

## 2023-07-04 MED ORDER — PHENYLEPHRINE HCL-NACL 20-0.9 MG/250ML-% IV SOLN
INTRAVENOUS | Status: DC | PRN
Start: 2023-07-04 — End: 2023-07-04
  Administered 2023-07-04: 50 ug/min via INTRAVENOUS

## 2023-07-04 MED ORDER — CEFAZOLIN SODIUM-DEXTROSE 2-4 GM/100ML-% IV SOLN
INTRAVENOUS | Status: AC
  Filled 2023-07-04: qty 100

## 2023-07-04 MED ORDER — CLOPIDOGREL BISULFATE 75 MG PO TABS: 75.0000 mg | ORAL_TABLET | Freq: Every day | ORAL | Status: DC

## 2023-07-04 MED ORDER — CLEVIDIPINE BUTYRATE 0.5 MG/ML IV EMUL
INTRAVENOUS | Status: AC
  Filled 2023-07-04: qty 50

## 2023-07-04 MED ORDER — CLEVIDIPINE BUTYRATE 0.5 MG/ML IV EMUL
INTRAVENOUS | Status: DC | PRN
  Administered 2023-07-04: 1 mg/h via INTRAVENOUS

## 2023-07-04 MED ORDER — PHENYLEPHRINE 80 MCG/ML (10ML) SYRINGE FOR IV PUSH (FOR BLOOD PRESSURE SUPPORT)
PREFILLED_SYRINGE | INTRAVENOUS | Status: DC | PRN
  Administered 2023-07-04: 40 ug via INTRAVENOUS
  Administered 2023-07-04: 200 ug via INTRAVENOUS

## 2023-07-04 MED ORDER — LEVOTHYROXINE SODIUM 75 MCG PO TABS
75.0000 ug | ORAL_TABLET | Freq: Every day | ORAL | Status: DC
  Administered 2023-07-05 – 2023-07-09 (×5): 75 ug
  Filled 2023-07-04 (×5): qty 1

## 2023-07-04 MED ORDER — IOHEXOL 300 MG/ML  SOLN
150.0000 mL | Freq: Once | INTRAMUSCULAR | Status: AC | PRN
  Administered 2023-07-05: 75 mL via INTRA_ARTERIAL

## 2023-07-04 MED ORDER — ATORVASTATIN CALCIUM 10 MG PO TABS
20.0000 mg | ORAL_TABLET | Freq: Every day | ORAL | Status: DC
  Administered 2023-07-05 – 2023-07-08 (×4): 20 mg
  Filled 2023-07-04 (×4): qty 2

## 2023-07-04 MED ORDER — DOCUSATE SODIUM 100 MG PO CAPS: 100.0000 mg | ORAL_CAPSULE | Freq: Two times a day (BID) | ORAL | Status: DC

## 2023-07-04 MED ORDER — ORAL CARE MOUTH RINSE
15.0000 mL | OROMUCOSAL | Status: DC
  Administered 2023-07-04 – 2023-07-10 (×69): 15 mL via OROMUCOSAL

## 2023-07-04 MED ORDER — INSULIN ASPART 100 UNIT/ML IJ SOLN: 0.0000 [IU] | Freq: Four times a day (QID) | INTRAMUSCULAR | Status: DC

## 2023-07-04 NOTE — Evaluation (Addendum)
Physical Therapy Evaluation Patient Details Name: Madeline [name] MRN: [id number] DOB: [birth year] Today's Date: 07/04/2023  History of Present Illness  Pt is an 89 y.o. female admitted 07/03/23 with AMS after daughter found her sitting on floor in bedroom. Head CT with diffuse cerebral atrophy; negative for acute abnormality. Awaiting MRI. Of note, admission 04/11/23 with unresponsiveness, respiratory failure, hypoglycemia. Other PMH includes CAD, DM2, dual cardiac pacemaker, HLD, COPD.   Clinical Impression  Pt presents with an overall decrease in functional mobility secondary to above. Per daughter, PTA, pt ambulates inside and outside without DME, lives at home with daughter and husband; pt performing majority of ADL/iADLs with intermittent supervision from family. Today, pt requiring maxA+2 to totalA for limited mobility. Pt demonstrates R-side hemiplegia (does withdraw RUE/RLE to pain and visually track past midline); pt following commands on L-side but not R-side; lethargic, opening eyes to stimulus but not talking; per RN, MD consulted regarding change in status. Pt's daughter present and supportive. Pt will likely benefit from post-acute rehab services (<3 hrs/day) to maximize functional mobility and independence prior to return home.       If plan is discharge home, recommend the following: A lot of help with walking and/or transfers;A lot of help with bathing/dressing/bathroom   Can travel by private vehicle   No    Equipment Recommendations  (TBD next venue)  Recommendations for Other Services       Functional Status Assessment Patient has had a recent decline in their functional status and demonstrates the ability to make significant improvements in function in a reasonable and predictable amount of time.     Precautions / Restrictions Precautions Precautions: Fall Restrictions Weight Bearing Restrictions: No      Mobility  Bed Mobility Overal bed mobility: Needs  Assistance Bed Mobility: Supine to Sit, Sit to Supine, Rolling Rolling: Max assist, +2 for physical assistance   Supine to sit: Total assist Sit to supine: Total assist   General bed mobility comments: maxA+2 to roll R/L for pericare and linen change due to bowel incontinence, pt assisting minimally with L hand on bed rail; totalA for supine<>sit not significantly improving arousal with upright sitting    Transfers                        Ambulation/Gait                  Stairs            Wheelchair Mobility     Tilt Bed    Modified Rankin (Stroke Patients Only) Modified Rankin (Stroke Patients Only) Pre-Morbid Rankin Score: No symptoms Modified Rankin: Severe disability     Balance Overall balance assessment: Needs assistance Sitting-balance support: No upper extremity supported Sitting balance-Leahy Scale: Zero Sitting balance - Comments: attempts to engage RUE active movement and WB while sitting EOB, pt with R-side and posterior lean, maxA to maintain sitting balance                                     Pertinent Vitals/Pain Pain Assessment Pain Assessment: Faces Faces Pain Scale: No hurt Pain Intervention(s): Monitored during session    Home Living Family/patient expects to be discharged to:: Private residence Living Arrangements: Spouse/significant other;Children Available Help at Discharge: Family;Available 24 hours/day Type of Home: House Home Access: Stairs to enter Entrance Stairs-Rails: Left Entrance Stairs-Number of Steps: 2-3  Home Layout: One level;Laundry or work area in [employer] Equipment: Agricultural consultant (2 wheels);Cane - quad      Prior Function Prior Level of Function : Independent/Modified Independent             Mobility Comments: independent ambulator without DME; pt's daughter reports she walks outside at least 1x/day, able to get up/down stairs without issue ADLs Comments: mod indep with  ADLs and majority of iADLs; manages medications with supervision from daughter     Extremity/Trunk Assessment   Upper Extremity Assessment Upper Extremity Assessment: Generalized weakness;RUE deficits/detail;Difficult to assess due to impaired cognition RUE Deficits / Details: some finger/wrist AROM noted, otherwise minimal active movement RUE other than withdrawing to pain    Lower Extremity Assessment Lower Extremity Assessment: Generalized weakness;RLE deficits/detail;Difficult to assess due to impaired cognition RLE Deficits / Details: little to no active movement noted RLE other than withdrawing to pain       Communication      Cognition Arousal: Lethargic Behavior During Therapy: Flat affect Overall Cognitive Status: Impaired/Different from baseline                                 General Comments: pt requiring repeated cues to keep eyes open, visually tracking but no verbalizations during sessions. pt follows majority of simple commands on L-side, but minimal active movement noted on R-side        General Comments General comments (skin integrity, edema, etc.): pt's daughter Elita Quick) present and supportive. HR 87, supine bed flat BP 123/96, supine HOB elevated BP 158/63 (88); SpO2 99% on 2L O2 [place]. RN reached out to MD during session regarding current deficits; code stroke called by RN    Exercises     Assessment/Plan    PT Assessment Patient needs continued PT services  PT Problem List Decreased strength;Decreased range of motion;Decreased balance;Decreased activity tolerance;Decreased mobility;Decreased coordination;Decreased cognition;Decreased knowledge of use of DME;Decreased safety awareness       PT Treatment Interventions DME instruction;Gait training;Stair training;Functional mobility training;Therapeutic activities;Therapeutic exercise;Balance training;Neuromuscular re-education;Cognitive remediation;Patient/family education;Wheelchair mobility  training    PT Goals (Current goals can be found in the Care Plan section)  Acute Rehab PT Goals PT Goal Formulation: Patient unable to participate in goal setting Time For Goal Achievement: 07/18/23 Potential to Achieve Goals: Fair    Frequency Min 1X/week     Co-evaluation PT/OT/SLP Co-Evaluation/Treatment: Yes Reason for Co-Treatment: Complexity of the patient's impairments (multi-system involvement);Necessary to address cognition/behavior during functional activity;For patient/therapist safety;To address functional/ADL transfers PT goals addressed during session: Mobility/safety with mobility         AM-PAC PT "6 Clicks" Mobility  Outcome Measure Help needed turning from your back to your side while in a flat bed without using bedrails?: Total Help needed moving from lying on your back to sitting on the side of a flat bed without using bedrails?: Total Help needed moving to and from a bed to a chair (including a wheelchair)?: Total Help needed standing up from a chair using your arms (e.g., wheelchair or bedside chair)?: Total Help needed to walk in hospital room?: Total Help needed climbing 3-5 steps with a railing? : Total 6 Click Score: 6    End of Session   Activity Tolerance: Patient limited by lethargy;Treatment limited secondary to medical complications (Comment) Patient left: in bed;with call bell/phone within reach;with nursing/sitter in room;with family/visitor present Nurse Communication: Mobility status PT Visit Diagnosis: Other abnormalities  of gait and mobility (R26.89);Muscle weakness (generalized) (M62.81);Other symptoms and signs involving the nervous system (R29.898)    Time: 1610-9604 PT Time Calculation (min) (ACUTE ONLY): 20 min   Charges:   PT Evaluation $PT Eval Moderate Complexity: 1 Mod   PT General Charges $$ ACUTE PT VISIT: 1 Visit        Ina Homes, PT, DPT Acute Rehabilitation Services  Personal: Secure Chat Rehab Office:  [phone]  Malachy Chamber 07/04/2023, 10:50 AM

## 2023-07-04 NOTE — ED Notes (Signed)
Pt transported to IR with ED RN and RR RN

## 2023-07-04 NOTE — Anesthesia Postprocedure Evaluation (Signed)
Anesthesia Post Note  Patient: Madeline [name]  Procedure(s) Performed: IR WITH ANESTHESIA     Patient location during evaluation: ICU Anesthesia Type: General Level of consciousness: sedated Vital Signs Assessment: post-procedure vital signs reviewed and stable Respiratory status: patient on ventilator - see flowsheet for VS and patient remains intubated per anesthesia plan Cardiovascular status: stable Anesthetic complications: no   No notable events documented.  Last Vitals:  Vitals:   07/04/23 1049 07/04/23 1506  BP: (!) 161/88   Pulse: 85 82  Resp: (!) 23 18  Temp:    SpO2: 100% 100%    Last Pain:  Vitals:   07/04/23 0950  TempSrc: Axillary  PainSc:                  Mariann Barter

## 2023-07-04 NOTE — ED Notes (Signed)
Daughter is taking pts dentures, rings and hearing aid.

## 2023-07-04 NOTE — ED Notes (Signed)
Report given to anesthesiology in IR

## 2023-07-04 NOTE — Anesthesia Preprocedure Evaluation (Addendum)
Anesthesia Evaluation  Patient identified by MRN, date of birth, ID band Patient unresponsive    Reviewed: Patient's Chart, lab work & pertinent test resultsPreop documentation limited or incomplete due to emergent nature of procedure.  History of Anesthesia Complications Negative for: history of anesthetic complications  Airway Mallampati: Unable to assess       Dental  (+) Edentulous Upper, Edentulous Lower   Pulmonary COPD,  oxygen dependent, Current Smoker and Patient abstained from smoking.   breath sounds clear to auscultation       Cardiovascular + CAD and +CHF  + dysrhythmias + pacemaker  Rhythm:Regular Rate:Tachycardia     Neuro/Psych  PSYCHIATRIC DISORDERS  Depression    CVA (evolving), Residual Symptoms    GI/Hepatic ,GERD  ,,  Endo/Other  diabetesHypothyroidism    Renal/GU Renal disease     Musculoskeletal   Abdominal   Peds  Hematology  (+) Blood dyscrasia, anemia   Anesthesia Other Findings   Reproductive/Obstetrics                              Anesthesia Physical Anesthesia Plan  ASA: 4  Anesthesia Plan: General   Post-op Pain Management:    Induction: Intravenous  PONV Risk Score and Plan: 1 and Ondansetron  Airway Management Planned: Oral ETT  Additional Equipment: Arterial line  Intra-op Plan:   Post-operative Plan: Post-operative intubation/ventilation  Informed Consent:      History available from chart only and Only emergency history available  Plan Discussed with: CRNA  Anesthesia Plan Comments:          Anesthesia Quick Evaluation

## 2023-07-04 NOTE — ED Notes (Signed)
Daughter Elita Quick would like to be point of contact for updates, number is [phone]

## 2023-07-04 NOTE — Consult Note (Signed)
NAME:  Madeline [name], MRN:  [id number], DOB:  10/03/1934, LOS: 0 ADMISSION DATE:  07/03/2023, CONSULTATION DATE: 07/04/2023 REFERRING MD: Dr. Corliss Skains, Reason for consult: Ventilator dependence  History of Present Illness:  89 year old woman with diabetes, COPD, gastroparesis, hyperlipidemia, hypertension.  She has a pacemaker for third-degree heart block, ITP.  She was found on the floor by family on 8/17.  She was conscious but uncertain what had happened, whether she had fallen.  Subsequent head CT was negative for acute abnormality.  She had some sensation asymmetry and dysarthria.  She was admitted for neurological workup.  Then early morning 8/18 significant change in her neurological status.  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  She went for endovascular treatment and interventional radiology but revascularization was not possible due to hard atherosclerotic plaque.  Subsequent head CT did not show any evidence of hemorrhage or complication.  She returned to the ICU intubated and sedated.  Blood pressure goal 140-160.  Currently on propofol, Cleviprex 2  Pertinent  Medical History   Past Medical History:  Diagnosis Date   Bradycardia    Coronary artery disease    Diabetes mellitus    Dyslipidemia    GERD (gastroesophageal reflux disease)    Hypothyroid    Presence of permanent cardiac pacemaker 12/16/2018   [employer] dual lead pacemaker    Significant Hospital Events: Including procedures, antibiotic start and stop dates in addition to other pertinent events     Interim History / Subjective:  Propofol 75, Cleviprex to 1.00, PEEP 5, 400 x 18 Chest x-ray and ABG pending  Objective   Blood pressure (!) 161/88, pulse 82, temperature 98 F (36.7 C), temperature source Axillary, resp. rate 18, height 5\' 2"  (1.575 m), weight 52.2 kg, SpO2 100%.    Vent Mode: PRVC FiO2 (%):  [100 %] 100 % Set Rate:  [18 bmp] 18 bmp Vt Set:   [400 mL] 400 mL PEEP:  [5 cmH20] 5 cmH20 Plateau Pressure:  [15 cmH20] 15 cmH20   Intake/Output Summary (Last 24 hours) at 07/04/2023 1521 Last data filed at 07/04/2023 1359 Gross per 24 hour  Intake 800 ml  Output 250 ml  Net 550 ml   Filed Weights   07/03/23 1450  Weight: 52.2 kg    Examination: General: Thin elderly critically ill-appearing woman, intubated and sedated HENT: ET tube in place, no secretions, oropharynx otherwise clear, pupils are equal Lungs: Clear bilaterally Cardiovascular: Regular, distant, no murmur Abdomen: Nondistended with positive bowel sounds Extremities: No edema Neuro: Deeply sedated currently, does not respond to voice or pain GU: Foley catheter  Resolved Hospital Problem list     Assessment & Plan:  89 year old woman with ventilator dependence post procedure and due to impaired airway protection from CVA.  Unsuccessful revascularization in IR 8/18  Acute left anterior A1 CVA.  Unable to revascularize in IR due to plaque -Antiplatelets and anticoagulation as per neurology recommendations -Atorvastatin ordered -Echocardiogram ordered -Seizure precautions -Target blood pressure 140-160, will use either phenylephrine or Cleviprex if needed -Repeat imaging as per neurology and IR plans >> MRI planned  Ventilator dependence due to impaired airway protection and following procedure -PRVC 8 cc/kg -Chest x-ray now -ABG and adjust minute ventilation, FiO2 appropriately -Sedation as per PAD protocol, currently propofol with fentanyl available as needed -Plan to lighten sedation on 8/19 after any planned imaging to facilitate spontaneous breathing trial, assessment for extubation -VAP prevention order set -Pulmonary hygiene  Hypertension -Home lisinopril on hold, restart as blood pressure will allow and when we have enteral access  Diabetes mellitus -Hold home Lantus 19 units and p.m. Semglee -Sliding-scale insulin as per protocol  History of  ITP -On Promacta as an outpatient, currently on hold  Hypothyroidism -Restart home levothyroxine 75 mcg  Nutrition -Would place cor-trac and initiate TF if she is not going to be extubated on 8/19    Best Practice (right click and "Reselect all SmartList Selections" daily)   Diet/type: NPO DVT prophylaxis: SCD GI prophylaxis: PPI Lines: N/A Foley:  Yes, and it is still needed Code Status:  full code Last date of multidisciplinary goals of care discussion [pending]  Labs   CBC: Recent Labs  Lab 07/03/23 1402 07/03/23 1422 07/04/23 0159  WBC 7.7  --  7.2  NEUTROABS 5.1  --   --   HGB 11.5* 13.3 12.2  HCT 35.6* 39.0 37.5  MCV 90.4  --  89.9  PLT 429*  --  422*    Basic Metabolic Panel: Recent Labs  Lab 07/03/23 1402 07/03/23 1422 07/04/23 0159  NA 141 142 136  K 4.2 4.1 4.1  CL 104 103 101  CO2 28  --  23  GLUCOSE 92 86 304*  BUN 11 11 12   CREATININE 0.87 0.80 0.84  CALCIUM 9.0  --  8.8*  MG  --   --  2.1  PHOS  --   --  4.2   GFR: Estimated Creatinine Clearance: 38 mL/min (by C-G formula based on SCr of 0.84 mg/dL). Recent Labs  Lab 07/03/23 1402 07/04/23 0159  WBC 7.7 7.2    Liver Function Tests: Recent Labs  Lab 07/03/23 1402  AST 24  ALT 19  ALKPHOS 87  BILITOT 0.6  PROT 6.4*  ALBUMIN 3.3*   No results for input(s): "LIPASE", "AMYLASE" in the last 168 hours. No results for input(s): "AMMONIA" in the last 168 hours.  ABG    Component Value Date/Time   PHART 7.350 04/11/2023 1310   PCO2ART 52.6 (H) 04/11/2023 1310   PO2ART 564 (H) 04/11/2023 1310   HCO3 29.0 (H) 04/11/2023 1310   TCO2 29 07/03/2023 1422   ACIDBASEDEF 4.1 (H) 10/04/2022 1218   O2SAT 100 04/11/2023 1310     Coagulation Profile: Recent Labs  Lab 07/03/23 1402  INR 1.0    Cardiac Enzymes: No results for input(s): "CKTOTAL", "CKMB", "CKMBINDEX", "TROPONINI" in the last 168 hours.  HbA1C: Hemoglobin A1C  Date/Time Value Ref Range Status  03/24/2019 12:00 AM  7.9  Final   Hgb A1c MFr Bld  Date/Time Value Ref Range Status  04/11/2023 05:05 PM 9.2 (H) 4.8 - 5.6 % Final    Comment:    (NOTE)         Prediabetes: 5.7 - 6.4         Diabetes: >6.4         Glycemic control for adults with diabetes: <7.0   12/15/2021 05:50 PM 8.2 (H) 4.8 - 5.6 % Final    Comment:    (NOTE)         Prediabetes: 5.7 - 6.4         Diabetes: >6.4         Glycemic control for adults with diabetes: <7.0     CBG: Recent Labs  Lab 07/03/23 1948 07/03/23 2135 07/04/23 0358 07/04/23 0744 07/04/23 1503  GLUCAP 185* 227* 296* 216* 254*    Review of Systems:   Unable to obtain  Past Medical History:  She,  has a past medical history of Bradycardia, Coronary artery disease, Diabetes mellitus, Dyslipidemia, GERD (gastroesophageal reflux disease), Hypothyroid, and Presence of permanent cardiac pacemaker (12/16/2018).   Surgical History:   Past Surgical History:  Procedure Laterality Date   APPENDECTOMY     BREAST BIOPSY Right 1967   EXCISIONAL - NEG   CHOLECYSTECTOMY     CORONARY STENT INTERVENTION     INSERT / REPLACE / REMOVE PACEMAKER  12/16/2018   no surgical hx     PACEMAKER IMPLANT N/A 12/16/2018   Procedure: PACEMAKER IMPLANT;  Surgeon: Marinus Maw, MD;  Location: [facility];  Service: Cardiovascular;  Laterality: N/A;     Social History:   reports that she has been smoking cigarettes. She has a 50 pack-year smoking history. She has never used smokeless tobacco. She reports that she does not drink alcohol and does not use drugs.   Family History:  Her family history includes Breast cancer (age of onset: 59) in her sister; Breast cancer (age of onset: 24) in her sister; Cancer in her sister, sister, and sister; Diabetes in her son; Heart Problems in her mother; Other in her father.   Allergies Allergies  Allergen Reactions   Darvon [Propoxyphene] Nausea And Vomiting   Sulfa Antibiotics Nausea And Vomiting     Home Medications   Prior to Admission medications   Medication Sig Start Date End Date Taking? Authorizing Provider  acetaminophen (TYLENOL) 325 MG tablet Take 1-2 tablets (325-650 mg total) by mouth every 6 (six) hours as needed for mild pain or moderate pain. 12/17/18  Yes Barrett, Joline Salt, PA-C  atorvastatin (LIPITOR) 20 MG tablet Take 1 tablet (20 mg total) by mouth daily at 6 PM. 12/09/20  Yes Pricilla Riffle, MD  eltrombopag (PROMACTA) 25 MG tablet Take 25 mg by mouth daily before breakfast.   Yes [provider]  Glucagon (GVOKE HYPOPEN [place]) Inject 1 Dose into the skin as needed (hypoglycemia).   Yes [provider]  insulin glargine (LANTUS) 100 UNIT/ML Solostar Pen Inject 19 Units into the skin every evening.   Yes [provider]  insulin lispro (HUMALOG KWIKPEN) 100 UNIT/ML KwikPen Inject 0-13 Units into the skin 3 (three) times daily before meals. Inject 0-13 units three times daily before meals per sliding scale: < 70 : 0 units 100-160 : 5 units 151-200 : 7 units 201-250 : 9 units 251-300 : 11 units > 301 : 13 units   Yes [provider]  levothyroxine (SYNTHROID) 75 MCG tablet Take 75 mcg by mouth daily before breakfast.   Yes [provider]  lisinopril (PRINIVIL,ZESTRIL) 20 MG tablet Take 1 tablet (20 mg total) by mouth daily. Patient taking differently: Take 10 mg by mouth every evening. 09/22/18 08/28/23 Yes Sainani, Rolly Pancake, MD  Multiple Vitamins-Minerals (MULTIVITAMIN WOMEN 50+) TABS Take 1 tablet by mouth every evening.   Yes [provider]  Insulin Syringes, Disposable, U-100 0.3 ML MISC 100 each by Does not apply route daily. 10/05/22   Kathlen Mody, MD     Critical care time: 40 minutes     Levy Pupa, MD, PhD 07/04/2023, 3:38 PM [facility] [phone] or if no answer before 7:00PM call [phone] For any issues after 7:00PM please call eLink [phone]

## 2023-07-04 NOTE — ED Notes (Signed)
Pt cleaned and changed following urinary occurrence.  Linens changed at same time.

## 2023-07-04 NOTE — Progress Notes (Addendum)
Neurology Stroke Code Note  S: Received a call from Madeline [name] bedside RN at 8:22AM about change in neuro exam. Last documented NIHSS was during her stroke code yesterday which was 3. NIHSS had been ordered q 2h x 12h, then q 4h but was not performed until 8:00AM today when the morning shift RN performed it and got a stroke scale of 12. LKW 2:40pm day prior. She was mute and not moving her R side. On my examination she had R hemiplegia, was mute, and did not follow commands. She had mild tremors / chewing movements of her mouth. Head CT showed no acute process on personal review. TNK was not administered 2/2 presentation outside the window. CTA showed L A1 occlusion that appears acute. CTP showed slow flow but no core or penumbra in the L ACA area. There was no MCA or ICA occlusion. Her sx were out of proportion to the occlusion but this may be seen in older patients who have less reserve. Due to the severity of her deficits, Dr. Roda Shutters, Dr. Corliss Skains, and myself conferenced and decided to offer the family intervention if they wished to be aggressive. I discussed risks, benefits, and alternatives to intervention with her daughter Madeline [name] [phone]). Specifically we discussed 10% risk of periprocedural hemorrhage. After discussing with her brothers and sisters Madeline [name] gave informed consent to proceed and patient was taken for IR thrombectomy.  Exam  Vitals:   07/04/23 0800 07/04/23 0837  BP: (!) 123/96   Pulse: 88   Resp: (!) 32   Temp:  (!) 96.2 F (35.7 C)  SpO2: 97%     Gen: patient lying in bed, NAD CV: extremities appear well-perfused Resp: normal WOB  Neurologic exam MS: alert, mute, does not follow commands Speech: severe dysarthria, global aphasia CN: PERRL, blinks to threat bilat, EOMI, sensation intact, R UMN facial droop, hearing intact to voice Motor: LUE no drift, LLE drift to bed, minimal movement against gravity RUE, no movement RLE Sensory: impaired to noxious stimuli RUE and  RLE Reflexes: 2+ symm with toes down bilat Coordination: UTA Gait: deferred  NIHSS components Score: Comment  1a Level of Conscious 0[x]  1[]  2[]  3[]      1b LOC Questions 0[]  1[]  2[x]       1c LOC Commands 0[]  1[]  2[x]       2 Best Gaze 0[x]  1[]  2[]       3 Visual 0[x]  1[]  2[]  3[]      4 Facial Palsy 0[]  1[x]  2[]  3[]      5a Motor Arm - left 0[x]  1[]  2[]  3[]  4[]  UN[]    5b Motor Arm - Right 0[]  1[]  2[x]  3[]  4[]  UN[]    6a Motor Leg - Left 0[]  1[]  2[x]  3[]  4[]  UN[]    6b Motor Leg - Right 0[]  1[]  2[]  3[x]  4[]  UN[]    7 Limb Ataxia 0[x]  1[]  2[]  3[]  UN[]     8 Sensory 0[]  1[x]  2[]  UN[]      9 Best Language 0[]  1[]  2[]  3[x]      10 Dysarthria 0[]  1[]  2[x]  UN[]      11 Extinct. and Inattention 0[x]  1[]  2[]       TOTAL:  20    CT head 1. Stable head CT.  No acute finding. 2. Extensive chronic small vessel ischemia. 3. ASPECTS 10  CTA H&N CTP 1. Abrupt cut off at the left A1 segment, acute appearing, with downstream reconstitution. The right A1 is hypoplastic. 2. 90% narrowing of the proximal left subclavian artery due to atheromatous plaque. No other flow  reducing stenosis in the neck. 3. No infarct or penumbra by standard perfusion thresholds. There is some delayed perfusion to the left ACA territory using a 4 second threshold.  All CNS imaging personally reviewed; I agree with above interpretations   A/P: Madeline [name] is a 89 y.o. female with history of diabetes, COPD, diabetic gastroparesis, hyperlipidemia, hypertension, pacemaker and hypothyroidism admitted yesterday after being found down between her bed and bedside table with mild L leg weakness, confusion, and mild dysarthria. Ddx at that time was syncope vs seizure vs stroke. This AM a change in neuro exam was noted at 8:22AM when she was found to be mute and not moving her R side. Head CT showed no acute process on personal review. CTA showed L A1 occlusion that appears acute. CTP showed slow flow but no core or penumbra in the L ACA  area. There was no MCA or ICA occlusion. Her sx were out of proportion to the occlusion but this may be seen in older patients who have less reserve. Due to the severity of her deficits, Dr. Roda Shutters, Dr. Corliss Skains, and myself conferenced and decided to offer the family intervention if they wished to be aggressive. I discussed risks, benefits, and alternatives to intervention with her daughter Madeline [name] [phone]). Specifically we discussed 10% risk of periprocedural hemorrhage. After discussing with her brothers and sisters Madeline [name] gave informed consent to proceed and patient was taken for IR thrombectomy.  - Admit to Neuro ICU post-procedure under Dr. Selina Cooley - BP parameters per IR, clevidipine gtt prn - MRI brain wo contrast - TTE - Check A1c and LDL + add statin per guidelines - Start antiplatelet tomorrow if no hemorrhage on post-procedure CT - Start pharmacologic DVT prophylaxis post-procedure if no hemorrhage on post-procedure CT - q4 hr neuro checks - STAT head CT for any change in neuro exam - Tele - PT/OT/SLP when able to participate - Stroke education - If aphasia or AMS persist after patient is extubated, consider LTM  Stroke team will assume care tomorrow.  This patient is critically ill and at significant risk of neurological worsening, death and care requires constant monitoring of vital signs, hemodynamics,respiratory and cardiac monitoring, neurological assessment, discussion with family, other specialists and medical decision making of high complexity. I spent 105 minutes of neurocritical care time  in the care of  this patient. This was time spent independent of any time provided by nurse practitioner or PA.  Bing Neighbors, MD [facility] [phone]  If 7pm- 7am, please page neurology on call as listed in AMION.

## 2023-07-04 NOTE — Procedures (Addendum)
History: 89 year old female being evaluated for altered mental status  Sedation: None  Technique: This EEG was acquired with electrodes placed according to the International 10-20 electrode system (including Fp1, Fp2, F3, F4, C3, C4, P3, P4, O1, O2, T3, T4, T5, T6, A1, A2, Fz, Cz, Pz). The following electrodes were missing or displaced: none.   Background: There is a posterior dominant rhythm of 10 Hz is seen bilaterally.  There is also excessive generalized anteriorly distributed beta activity.  This does give a sharply contoured appearance the background, which coupled with the left temporal slow activity does at times get suggestion of interictal epileptiform activity but I do not see any discharges that clearly disrupt the background.  Irregular slow activity in the left frontotemporal region is present.  Photic stimulation: Physiologic driving is not performed  EEG Abnormalities: 1) left frontotemporal irregular slow activity 2) generalized irregular slow activity  Clinical Interpretation: This EEG recorded evidence of left frontotemporal focal area of cerebral dysfunction in the setting of more generalized region of cerebral dysfunction.   There was no seizure or definite seizure predisposition recorded on this study. Please note that lack of epileptiform activity on EEG does not preclude the possibility of epilepsy.   Ritta Slot, MD [facility] [phone]  If 7pm- 7am, please page neurology on call as listed in AMION.

## 2023-07-04 NOTE — ED Notes (Signed)
Selina Cooley MD, Neurology at bedside

## 2023-07-04 NOTE — ED Notes (Signed)
MD rounding team at bedside  

## 2023-07-04 NOTE — Anesthesia Procedure Notes (Addendum)
Arterial Line Insertion Start/End8/18/2024 11:15 AM, 07/04/2023 11:20 AM Performed by: Mariann Barter, MD, attending  Patient location: OOR procedure area. Preanesthetic checklist: patient identified, IV checked, site marked, risks and benefits discussed, surgical consent, monitors and equipment checked, pre-op evaluation, timeout performed and anesthesia consent Right, radial was placed Catheter size: 20 G Hand hygiene performed  and maximum sterile barriers used   Attempts: 2 Procedure performed using ultrasound guided technique. Ultrasound Notes:anatomy identified and needle tip was noted to be adjacent to the nerve/plexus identified Following insertion, dressing applied. Post procedure assessment: normal  Patient tolerated the procedure well with no immediate complications.

## 2023-07-04 NOTE — Evaluation (Addendum)
Occupational Therapy Evaluation Patient Details Name: Madeline [name] MRN: [id number] DOB: 1934-06-01 Today's Date: 07/04/2023   History of Present Illness Pt is an 89 y.o. female admitted 07/03/23 with AMS after daughter found her sitting on floor in bedroom. Head CT with diffuse cerebral atrophy; negative for acute abnormality. Awaiting MRI. Of note, admission 04/11/23 with unresponsiveness, respiratory failure, hypoglycemia. Other PMH includes CAD, DM2, dual cardiac pacemaker, HLD, COPD.   Clinical Impression   Patient supine in bed and daughter at side.  Daughter reports pt typically completes ADLs and mobility without AD with supervision from family, assist with IADLs as needed.  Today overall requires +2 total -max assist for bed mobility and ADLs, incontinent of bowel and assisted with hygiene. Pt able to follow minimal commands with LUE but not RUE, spontaneously moving L UE but not R UE (some hand/wrist movement noted), she tracks visually past midline and focuses gaze on therapist.  She withdrawals to noxious stimuli on R UE/LE, but remains lethargic and does not verbalize.  Per RN consulting MD due to change in status. Based on performance today, believe patient will best benefit from continued OT services acutely and after dc at inpatient setting with <3hrs/day to optimize independence, safety with ADLs and mobility.       If plan is discharge home, recommend the following: Two people to help with walking and/or transfers;Two people to help with bathing/dressing/bathroom;Other (comment) (total care)    Functional Status Assessment  Patient has had a recent decline in their functional status and demonstrates the ability to make significant improvements in function in a reasonable and predictable amount of time.  Equipment Recommendations  Other (comment) (defer)    Recommendations for Other Services       Precautions / Restrictions Precautions Precautions:  Fall Restrictions Weight Bearing Restrictions: No      Mobility Bed Mobility Overal bed mobility: Needs Assistance Bed Mobility: Supine to Sit, Sit to Supine, Rolling Rolling: Max assist, +2 for physical assistance   Supine to sit: Total assist Sit to supine: Total assist   General bed mobility comments: maxA+2 to roll R/L for pericare and linen change due to bowel incontinence, pt assisting minimally with L hand on bed rail; totalA for supine<>sit not significantly improving arousal with upright sitting    Transfers                          Balance Overall balance assessment: Needs assistance Sitting-balance support: No upper extremity supported Sitting balance-Leahy Scale: Zero Sitting balance - Comments: attempts to engage RUE active movement and WB while sitting EOB, pt with R-side and posterior lean, maxA to maintain sitting balance                                   ADL either performed or assessed with clinical judgement   ADL Overall ADL's : Needs assistance/impaired                                     Functional mobility during ADLs: Total assistance;+2 for physical assistance;+2 for safety/equipment       Vision   Additional Comments: further assessment required, able to open eyes to commands and scan from L to R     Perception         Praxis  Pertinent Vitals/Pain Pain Assessment Pain Assessment: Faces Faces Pain Scale: No hurt Pain Intervention(s): Monitored during session     Extremity/Trunk Assessment Upper Extremity Assessment Upper Extremity Assessment: Generalized weakness;RUE deficits/detail;LUE deficits/detail RUE Deficits / Details: some finger/wrist AROM noted but unsure if truely following commands to squeeze therapists hand, withdrawling to pain RUE Coordination: decreased fine motor;decreased gross motor LUE Deficits / Details: moving L side spontaneously, follow commands to squeeze hand  and give high five   Lower Extremity Assessment Lower Extremity Assessment: Defer to PT evaluation       Communication Communication Communication: No apparent difficulties   Cognition Arousal: Lethargic Behavior During Therapy: Flat affect Overall Cognitive Status: Impaired/Different from baseline                                 General Comments: pt requiring repeated cues to keep eyes open, visually tracking but no verbalizations during sessions. pt follows majority of simple commands on L-side, but minimal active movement noted on R-side     General Comments  pt's daughter Madeline [name]) present and supportive. HR 87, supine bed flat BP 123/96, supine HOB elevated BP 158/63 (88); SpO2 99% on 2L O2 [place]. RN reached out to MD during session regarding current deficits; code stroke called by RN    Exercises     Shoulder Instructions      Home Living Family/patient expects to be discharged to:: Private residence Living Arrangements: Spouse/significant other;Children Available Help at Discharge: Family;Available 24 hours/day Type of Home: House Home Access: Stairs to enter [employer] of Steps: 2-3 Entrance Stairs-Rails: Left Home Layout: One level;Laundry or work area in basement     [employer]: Sponge bathes at baseline   [employer]: Standard     Home Equipment: Agricultural consultant (2 wheels);Cane - quad          Prior Functioning/Environment Prior Level of Function : Independent/Modified Independent             Mobility Comments: independent ambulator without DME; pt's daughter reports she walks outside at least 1x/day, able to get up/down stairs without issue ADLs Comments: mod indep with ADLs and majority of iADLs; manages medications with supervision from daughter        OT Problem List: Decreased strength;Decreased activity tolerance;Impaired balance (sitting and/or standing);Decreased range of motion;Impaired  vision/perception;Decreased coordination;Decreased cognition;Decreased safety awareness;Decreased knowledge of use of DME or AE;Decreased knowledge of precautions;Impaired UE functional use      OT Treatment/Interventions: Self-care/ADL training;Therapeutic exercise;DME and/or AE instruction;Therapeutic activities;Cognitive remediation/compensation;Visual/perceptual remediation/compensation;Balance training;Patient/family education;Neuromuscular education    OT Goals(Current goals can be found in the care plan section) Acute Rehab OT Goals Patient Stated Goal: none stated OT Goal Formulation: Patient unable to participate in goal setting Time For Goal Achievement: 07/18/23 Potential to Achieve Goals: Fair  OT Frequency: Min 1X/week    Co-evaluation PT/OT/SLP Co-Evaluation/Treatment: Yes Reason for Co-Treatment: Complexity of the patient's impairments (multi-system involvement);Necessary to address cognition/behavior during functional activity;For patient/therapist safety;To address functional/ADL transfers PT goals addressed during session: Mobility/safety with mobility OT goals addressed during session: ADL's and self-care      AM-PAC OT "6 Clicks" Daily Activity     Outcome Measure Help from another person eating meals?: Total Help from another person taking care of personal grooming?: Total Help from another person toileting, which includes using toliet, bedpan, or urinal?: Total Help from another person bathing (including washing, rinsing, drying)?: Total Help from another person to  put on and taking off regular upper body clothing?: Total Help from another person to put on and taking off regular lower body clothing?: Total 6 Click Score: 6   End of Session Nurse Communication: Mobility status  Activity Tolerance: Patient limited by lethargy Patient left: in bed;with call bell/phone within reach;with nursing/sitter in room;with family/visitor present  OT Visit Diagnosis: Other  abnormalities of gait and mobility (R26.89);Muscle weakness (generalized) (M62.81);Other symptoms and signs involving cognitive function;Cognitive communication deficit (R41.841)                Time: 0865-7846 OT Time Calculation (min): 20 min Charges:  OT General Charges $OT Visit: 1 Visit OT Evaluation $OT Eval Moderate Complexity: 1 Mod  Barry Brunner, OT Acute Rehabilitation Services Office [phone]   Chancy Milroy 07/04/2023, 1:21 PM

## 2023-07-04 NOTE — Procedures (Signed)
INR.  Status post left common carotid arteriogram.  Right CFA approach.  Findings.  Proximal occlusion  of left anterior cerebral artery A1 segment.  Status post endovascular treatment  a 3 mm x 20 mm Solitaire X retriever  device, and contact aspiration with 055 aspiration catheter without revascularization due a hard atherosclerotic plaque.  Multiple subsequent attempts also unsuccessful  due to the hard plaque  precluding distal access.  CT of brain revealed no evidence of  hemorrhage complications.  61F exoseal  closure device deployed at the right groin puncture site.  Distal pulses all present unchanged from prior to the procedure.  Patient left intubated to protect airway as per anesthesia.  Fatima Sanger MD..

## 2023-07-04 NOTE — Code Documentation (Signed)
Stroke Response Nurse Documentation Code Documentation  LAYCE RYALL is a 89 y.o. female arriving to [facility]  via [facility] on 07-03-2023 with past medical hx of 07-03-2023. On No antithrombotic. Code stroke was activated by ED.   Patient from home where she was LKW at 11am 07/03/2023 and now complaining of aphasia.  On arrival to the ED her NIHSS was 3, missed one question, mild right leg weakness and mild dysarthria.  Too mild to treat.  Plan for NIHSS q 2 x 12, MRI and EEG.  Today her NIHSS is 19  Code Stroke was reactivated.  Stroke team at the bedside on patient arrival. Labs drawn and patient cleared for CT. Patient to CT with team. NIHSS 19, see documentation for details and code stroke times. Patient with decreased LOC, disoriented, not following commands, right facial droop, right arm weakness, bilateral leg weakness, right decreased sensation, Global aphasia , and dysarthria  on exam. The following imaging was completed:  CT Head, CTA, and CTP. Patient is not a candidate for IV Thrombolytic due to last known well yesterday at 11am. Patient is a candidate for IR, daughter speaking with other family members. Code IR activated at 1048.  Transported to [employer] suite.     Bedside handoff with IR team    Marcellina Millin  Stroke Response RN

## 2023-07-04 NOTE — Progress Notes (Signed)
LTM EEG hooked up and running - no initial skin breakdown - push button tested - [facility] monitoring. Pt did get pink with min. Prep.

## 2023-07-04 NOTE — TOC CAGE-AID Note (Signed)
Transition of Care [facility]) - CAGE-AID Screening   Patient Details  Name: Madeline [name] MRN: [id number] Date of Birth: 08/10/1934  Transition of Care [facility]) CM/SW Contact:    Katha Hamming, RN Phone Number: 07/04/2023, 9:12 PM     CAGE-AID Screening: Substance Abuse Screening unable to be completed due to: : Patient unable to participate (intubated)

## 2023-07-04 NOTE — Anesthesia Procedure Notes (Signed)
Procedure Name: Intubation Date/Time: 07/04/2023 11:25 AM  Performed by: Gwenyth Allegra, CRNAPre-anesthesia Checklist: Patient identified, Emergency Drugs available, Suction available, Patient being monitored and Timeout performed Patient Re-evaluated:Patient Re-evaluated prior to induction Oxygen Delivery Method: Circle system utilized Induction Type: IV induction and Rapid sequence Laryngoscope Size: Mac and 4 Grade View: Grade I Tube type: Oral Tube size: 7.0 mm Number of attempts: 1 Airway Equipment and Method: Stylet Placement Confirmation: ETT inserted through vocal cords under direct vision, breath sounds checked- equal and bilateral and positive ETCO2 Secured at: 21 cm Tube secured with: Tape Dental Injury: Teeth and Oropharynx as per pre-operative assessment

## 2023-07-04 NOTE — Progress Notes (Deleted)
Neurology Stroke Code Note  S: Received a call from Sarah bedside RN at 8:22AM about change in neuro exam. Last documented NIHSS was during her stroke code yesterday which was 3. NIHSS had been ordered q 2h x 12h, then q 4h but was not performed until 8:00AM today when the morning shift RN performed it and got a stroke scale of 12. LKW 2:40pm day prior. She was mute and not moving her R side. On my examination she had R hemiplegia, was mute, and did not follow commands. She had mild tremors / chewing movements of her mouth. Head CT showed no acute process on personal review. TNK was not administered 2/2 presentation outside the window. CTA showed L A1 occlusion that appears acute. CTP showed slow flow but no core or penumbra in the L ACA area. There was no MCA or ICA occlusion. Her sx were out of proportion to the occlusion but this may be seen in older patients who have less reserve. Due to the severity of her deficits, Dr. Roda Shutters, Dr. Corliss Skains, and myself conferenced and decided to offer the family intervention if they wished to be aggressive. I discussed risks, benefits, and alternatives to intervention with [name] [phone]). Specifically we discussed 10% risk of periprocedural hemorrhage. After discussing with her brothers and sisters [name] gave informed consent to proceed and patient was taken for IR thrombectomy.  Exam  Vitals:   07/04/23 1030 07/04/23 1049  BP: (!) 122/42 (!) 161/88  Pulse: 83 85  Resp: 19 (!) 23  Temp:    SpO2: 100% 100%    Gen: patient lying in bed, NAD CV: extremities appear well-perfused Resp: normal WOB  Neurologic exam MS: alert, mute, does not follow commands Speech: severe dysarthria, global aphasia CN: PERRL, blinks to threat bilat, EOMI, sensation intact, R UMN facial droop, hearing intact to voice Motor: LUE no drift, LLE drift to bed, minimal movement against gravity RUE, no movement RLE Sensory: impaired to noxious stimuli RUE and  RLE Reflexes: 2+ symm with toes down bilat Coordination: UTA Gait: deferred  NIHSS components Score: Comment  1a Level of Conscious 0[x]  1[]  2[]  3[]      1b LOC Questions 0[]  1[]  2[x]       1c LOC Commands 0[]  1[]  2[x]       2 Best Gaze 0[x]  1[]  2[]       3 Visual 0[x]  1[]  2[]  3[]      4 Facial Palsy 0[]  1[x]  2[]  3[]      5a Motor Arm - left 0[x]  1[]  2[]  3[]  4[]  UN[]    5b Motor Arm - Right 0[]  1[]  2[x]  3[]  4[]  UN[]    6a Motor Leg - Left 0[]  1[]  2[x]  3[]  4[]  UN[]    6b Motor Leg - Right 0[]  1[]  2[]  3[x]  4[]  UN[]    7 Limb Ataxia 0[x]  1[]  2[]  3[]  UN[]     8 Sensory 0[]  1[x]  2[]  UN[]      9 Best Language 0[]  1[]  2[]  3[x]      10 Dysarthria 0[]  1[]  2[x]  UN[]      11 Extinct. and Inattention 0[x]  1[]  2[]       TOTAL:  20    CT head 1. Stable head CT.  No acute finding. 2. Extensive chronic small vessel ischemia. 3. ASPECTS 10  CTA H&N CTP 1. Abrupt cut off at the left A1 segment, acute appearing, with downstream reconstitution. The right A1 is hypoplastic. 2. 90% narrowing of the proximal left subclavian artery due to atheromatous plaque. No other flow reducing stenosis in  the neck. 3. No infarct or penumbra by standard perfusion thresholds. There is some delayed perfusion to the left ACA territory using a 4 second threshold.  All CNS imaging personally reviewed; I agree with above interpretations   A/P: Madeline [name] is a 89 y.o. female with history of diabetes, COPD, diabetic gastroparesis, hyperlipidemia, hypertension, pacemaker and hypothyroidism admitted yesterday after being found down between her bed and bedside table with mild L leg weakness, confusion, and mild dysarthria. Ddx at that time was syncope vs seizure vs stroke. This AM a change in neuro exam was noted at 8:22AM when she was found to be mute and not moving her R side. Head CT showed no acute process on personal review. CTA showed L A1 occlusion that appears acute. CTP showed slow flow but no core or penumbra in the L ACA  area. There was no MCA or ICA occlusion. Her sx were out of proportion to the occlusion but this may be seen in older patients who have less reserve. Due to the severity of her deficits, Dr. Roda Shutters, Dr. Corliss Skains, and myself conferenced and decided to offer the family intervention if they wished to be aggressive. I discussed risks, benefits, and alternatives to intervention with [name] [phone]). Specifically we discussed 10% risk of periprocedural hemorrhage. After discussing with her brothers and sisters [name] gave informed consent to proceed and patient was taken for IR thrombectomy.  - Admit to Neuro ICU post-procedure under Dr. Selina Cooley - BP parameters per IR, clevidipine gtt prn - MRI brain wo contrast - TTE - Check A1c and LDL + add statin per guidelines - Start antiplatelet tomorrow if no hemorrhage on post-procedure CT - Start pharmacologic DVT prophylaxis post-procedure if no hemorrhage on post-procedure CT - q4 hr neuro checks - STAT head CT for any change in neuro exam - Tele - PT/OT/SLP when able to participate - Stroke education - If aphasia or AMS persist after patient is extubated, consider LTM  Stroke team will assume care tomorrow.  This patient is critically ill and at significant risk of neurological worsening, death and care requires constant monitoring of vital signs, hemodynamics,respiratory and cardiac monitoring, neurological assessment, discussion with family, other specialists and medical decision making of high complexity. I spent 105 minutes of neurocritical care time  in the care of  this patient. This was time spent independent of any time provided by nurse practitioner or PA.  Bing Neighbors, MD [facility] [phone]  If 7pm- 7am, please page neurology on call as listed in AMION.

## 2023-07-04 NOTE — ED Notes (Signed)
Pt oxygen dropped to 86% RA while sleeping.  Pt placed on 2L [place] and up to 99%.

## 2023-07-04 NOTE — Progress Notes (Addendum)
Subjective: Patient is a 89 year old female with past medical history of CAD with pacemaker, diabetes, hyperlipidemia, hypertension  and COPD who presented to the ED after her daughter found her somewhat confused and seated in between her dresser and her bed at her home and admitted for AMS  Overnight Event: Patient desaturated to 88% on room air placed on 2 L of nasal cannula and his sat is went up to 99%.  Objective:  Vital signs in last 24 hours: Vitals:   07/04/23 0412 07/04/23 0430 07/04/23 0500 07/04/23 0530  BP:  (!) 153/61 (!) 142/64 (!) 142/60  Pulse:  89 92 86  Resp:  18 20 (!) 21  Temp: 98.7 F (37.1 C)     TempSrc: Oral     SpO2:  99% 97% 96%  Weight:      Height:       General: Laying in bed sleeping. Hard to arouse  Cards: Strong LE pulses on the left,minimal on the right. Neurology: Right side hemiplegia,mute and not following commands.Patient seemed worse as compared to yesterday.  Assessment/Plan:  Principal Problem:   Altered mental state  # Altered mental status of unspecified etiology #Concern for stroke  Patient is a 89 year old female who had an acute mental status change after her daughter found her sitting on the floor in her bed room.  Patient's daughter reported the patient exhibited selective mutism and would answer some questions and just stare at her which is very unusual. On exam, patient appears aphasic.CT head did not reveal any acute intracranial abnormalities, or any sign of skull fracture. Less suspicion of traumatic cause of altered mental status at this time. Patient has a hx of stent placement with a pacemaker ,[employer] pacemaker interrogated and there was no evidence pacemaker dysfunctioning,however syncope induced AMS cannot be ruled out entirely. CXR also didn't show any evidence of acute cardiopulmonary process. Patient was afebrile on presentation and no signs of nuchal rigidity,less concern for meningitis or encephalitis.  I wonder if  this altered mental status could be due to metabolic derangements in the setting of hypoglycemia. Patient did receive her regular dose of insulin in the morning after breakfast , blood glucose on presentation was 86. This morning, her blood sugar went up to 304.  This morning (07/04/2023) , patient was sleeping during our encounter and was difficult to arouse. Her nurse noticed motor deficit on patient's right side. Pt had an NIHSS score of 3 during admission but a repeat exam by her nurse this mornings had a score of 12,concerning for stroke.Dr Selina Cooley was consulted for further assessment. Dr Selina Cooley evaluated the patient at bedside with the internal medicine teaching team and recommended CTA. CTA showed an abrupt cut off at the left A1 segment, acute appearing, with downstream reconstitution. The right A1 was hypoplastic and 90% narrowing of the proximal left subclavian artery due to atheromatous plaque. There was no other flow reducing stenosis in the neck.There was also some delayed perfusion to the left ACA territory using a 4 second threshold. Neurology deemed it medically necessary for an emergent thrombectomy. Patient was then upgraded to ICU and got intubated. ICU will be following from here.   - Happy to follow up on patient  after downgrade to the floors   #Chronic Conditions HLD - Continue home Lipitor 20 mg when able  HTN - Continue Lisinopril 20 mg when able  Diabetes -  Resume SSI when able  Hypothyroidism- Continue home Levothyroxine 75 mcg when able  Thrombocytopenia- Continue  home Promacta when able    Prior to Admission Living Arrangement: Anticipated Discharge Location: Barriers to Discharge: Dispo: Anticipated discharge in approximately 7 day(s). Currently intubated in the ICU   Kathleen Lime, MD 07/04/2023, 5:55 AM Pager: @MYPAGER @ After 5pm on weekdays and 1pm on weekends: On Call pager [phone]

## 2023-07-04 NOTE — ED Notes (Signed)
Charge RN Ladona Ridgel notified of pt status changes, neurology paged, awaiting response.

## 2023-07-04 NOTE — Sedation Documentation (Signed)
Family updated as to patient's status.@125  pm. MD still working.

## 2023-07-04 NOTE — ED Notes (Signed)
Received call back from [facility] from neuro, neurology coming to bedside to assess pt.

## 2023-07-04 NOTE — Transfer of Care (Signed)
Immediate Anesthesia Transfer of Care Note  Patient: Madeline [name]  Procedure(s) Performed: IR WITH ANESTHESIA  Patient Location: ICU  Anesthesia Type:General  Level of Consciousness: sedated, unresponsive, and Patient remains intubated per anesthesia plan  Airway & Oxygen Therapy: Patient remains intubated per anesthesia plan and Patient placed on Ventilator (see vital sign flow sheet for setting)  Post-op Assessment: Report given to RN and Post -op Vital signs reviewed and stable  Post vital signs: Reviewed and stable  Last Vitals:  Vitals Value Taken Time  BP 134/65 07/04/23 1458  Temp    Pulse 76   Resp 19 07/04/23 1500  SpO2 100%   Vitals shown include unfiled device data.  Last Pain:  Vitals:   07/04/23 0950  TempSrc: Axillary  PainSc:          Complications: No notable events documented.

## 2023-07-04 NOTE — ED Notes (Addendum)
NIH changes from previous NIH, pt is lethargic and not responding to questions. CBG 214, Neurology paged.

## 2023-07-05 ENCOUNTER — Inpatient Hospital Stay (HOSPITAL_COMMUNITY): Payer: PPO

## 2023-07-05 ENCOUNTER — Encounter (HOSPITAL_COMMUNITY): Payer: Self-pay | Admitting: Radiology

## 2023-07-05 DIAGNOSIS — R55 Syncope and collapse: Secondary | ICD-10-CM

## 2023-07-05 DIAGNOSIS — I639 Cerebral infarction, unspecified: Secondary | ICD-10-CM

## 2023-07-05 DIAGNOSIS — I63522 Cerebral infarction due to unspecified occlusion or stenosis of left anterior cerebral artery: Secondary | ICD-10-CM | POA: Diagnosis not present

## 2023-07-05 DIAGNOSIS — R569 Unspecified convulsions: Secondary | ICD-10-CM | POA: Diagnosis not present

## 2023-07-05 DIAGNOSIS — R4182 Altered mental status, unspecified: Secondary | ICD-10-CM | POA: Diagnosis not present

## 2023-07-05 LAB — CBC WITH DIFFERENTIAL/PLATELET
Abs Immature Granulocytes: 0.06 10*3/uL (ref 0.00–0.07)
Basophils Absolute: 0.1 10*3/uL (ref 0.0–0.1)
Basophils Relative: 0 %
Eosinophils Absolute: 0 10*3/uL (ref 0.0–0.5)
Eosinophils Relative: 0 %
HCT: 33.6 % — ABNORMAL LOW (ref 36.0–46.0)
Hemoglobin: 10.8 g/dL — ABNORMAL LOW (ref 12.0–15.0)
Immature Granulocytes: 1 %
Lymphocytes Relative: 9 %
Lymphs Abs: 1.1 10*3/uL (ref 0.7–4.0)
MCH: 29.3 pg (ref 26.0–34.0)
MCHC: 32.1 g/dL (ref 30.0–36.0)
MCV: 91.3 fL (ref 80.0–100.0)
Monocytes Absolute: 0.8 10*3/uL (ref 0.1–1.0)
Monocytes Relative: 7 %
Neutro Abs: 9.7 10*3/uL — ABNORMAL HIGH (ref 1.7–7.7)
Neutrophils Relative %: 83 %
Platelets: 417 10*3/uL — ABNORMAL HIGH (ref 150–400)
RBC: 3.68 MIL/uL — ABNORMAL LOW (ref 3.87–5.11)
RDW: 13 % (ref 11.5–15.5)
WBC: 11.7 10*3/uL — ABNORMAL HIGH (ref 4.0–10.5)
nRBC: 0 % (ref 0.0–0.2)

## 2023-07-05 LAB — ECHOCARDIOGRAM COMPLETE
Area-P 1/2: 4.17 cm2
Est EF: >75
Height: 62 in
MV M vel: 1.47 m/s
MV Peak grad: 8.6 mmHg
S' Lateral: 2.4 cm
Weight: 1840 [oz_av]

## 2023-07-05 LAB — BASIC METABOLIC PANEL
Anion gap: 15 (ref 5–15)
BUN: 15 mg/dL (ref 8–23)
CO2: 20 mmol/L — ABNORMAL LOW (ref 22–32)
Calcium: 8.6 mg/dL — ABNORMAL LOW (ref 8.9–10.3)
Chloride: 107 mmol/L (ref 98–111)
Creatinine, Ser: 0.9 mg/dL (ref 0.44–1.00)
GFR, Estimated: >60 mL/min (ref >60)
Glucose, Bld: 230 mg/dL — ABNORMAL HIGH (ref 70–99)
Potassium: 4.1 mmol/L (ref 3.5–5.1)
Sodium: 142 mmol/L (ref 135–145)

## 2023-07-05 LAB — GLUCOSE, CAPILLARY
Glucose-Capillary: 211 mg/dL — ABNORMAL HIGH (ref 70–99)
Glucose-Capillary: 230 mg/dL — ABNORMAL HIGH (ref 70–99)
Glucose-Capillary: 253 mg/dL — ABNORMAL HIGH (ref 70–99)
Glucose-Capillary: 226 mg/dL — ABNORMAL HIGH (ref 70–99)
Glucose-Capillary: 134 mg/dL — ABNORMAL HIGH (ref 70–99)
Glucose-Capillary: 135 mg/dL — ABNORMAL HIGH (ref 70–99)

## 2023-07-05 LAB — TRIGLYCERIDES: Triglycerides: 92 mg/dL (ref <150)

## 2023-07-05 MED ORDER — INSULIN GLARGINE-YFGN 100 UNIT/ML ~~LOC~~ SOLN
5.0000 [IU] | Freq: Every day | SUBCUTANEOUS | Status: DC
  Administered 2023-07-05 – 2023-07-06 (×2): 5 [IU] via SUBCUTANEOUS
  Filled 2023-07-05 (×3): qty 0.05

## 2023-07-05 MED ORDER — ASPIRIN 325 MG PO TABS
325.0000 mg | ORAL_TABLET | Freq: Every day | ORAL | Status: DC
  Administered 2023-07-05 – 2023-07-09 (×5): 325 mg
  Filled 2023-07-05 (×5): qty 1

## 2023-07-05 MED ORDER — AMLODIPINE BESYLATE 5 MG PO TABS: 5.0000 mg | ORAL_TABLET | Freq: Every day | ORAL | Status: DC

## 2023-07-05 MED ORDER — AMLODIPINE BESYLATE 10 MG PO TABS
10.0000 mg | ORAL_TABLET | Freq: Every day | ORAL | Status: DC
  Filled 2023-07-05: qty 1

## 2023-07-05 MED ORDER — ENOXAPARIN SODIUM 30 MG/0.3ML IJ SOSY
30.0000 mg | PREFILLED_SYRINGE | INTRAMUSCULAR | Status: DC
  Administered 2023-07-05: 30 mg via SUBCUTANEOUS
  Filled 2023-07-05: qty 0.3

## 2023-07-05 MED ORDER — INSULIN ASPART 100 UNIT/ML IJ SOLN
0.0000 [IU] | INTRAMUSCULAR | Status: DC
  Administered 2023-07-05: 8 [IU] via SUBCUTANEOUS
  Administered 2023-07-05 (×2): 2 [IU] via SUBCUTANEOUS
  Administered 2023-07-05 (×2): 5 [IU] via SUBCUTANEOUS
  Administered 2023-07-06 (×3): 3 [IU] via SUBCUTANEOUS
  Administered 2023-07-06: 5 [IU] via SUBCUTANEOUS
  Administered 2023-07-06 – 2023-07-07 (×3): 3 [IU] via SUBCUTANEOUS
  Administered 2023-07-07: 8 [IU] via SUBCUTANEOUS
  Administered 2023-07-07: 5 [IU] via SUBCUTANEOUS
  Administered 2023-07-07: 3 [IU] via SUBCUTANEOUS
  Administered 2023-07-07 – 2023-07-08 (×4): 5 [IU] via SUBCUTANEOUS
  Administered 2023-07-08: 3 [IU] via SUBCUTANEOUS
  Administered 2023-07-08: 5 [IU] via SUBCUTANEOUS
  Administered 2023-07-08: 10 [IU] via SUBCUTANEOUS
  Administered 2023-07-09 (×2): 5 [IU] via SUBCUTANEOUS
  Administered 2023-07-09 (×2): 3 [IU] via SUBCUTANEOUS
  Administered 2023-07-09: 11 [IU] via SUBCUTANEOUS
  Administered 2023-07-09: 8 [IU] via SUBCUTANEOUS
  Administered 2023-07-10 (×4): 3 [IU] via SUBCUTANEOUS
  Administered 2023-07-11: 2 [IU] via SUBCUTANEOUS
  Administered 2023-07-11: 3 [IU] via SUBCUTANEOUS

## 2023-07-05 NOTE — Progress Notes (Signed)
Referring Physician(s): Dr. Bing Neighbors  Supervising Physician: Julieanne Cotton  Patient Status:  [facility] - In-pt  Chief Complaint: Code stroke L ACA occlusion  Subjective: Intubated/sedated after attempted revascularization yesterday. Spontaneous movement with left arm only.  Family at bedside.    Allergies: Darvon [propoxyphene] and Sulfa antibiotics  Medications: Prior to Admission medications   Medication Sig Start Date End Date Taking? Authorizing Provider  acetaminophen (TYLENOL) 325 MG tablet Take 1-2 tablets (325-650 mg total) by mouth every 6 (six) hours as needed for mild pain or moderate pain. 12/17/18  Yes Barrett, Joline Salt, PA-C  atorvastatin (LIPITOR) 20 MG tablet Take 1 tablet (20 mg total) by mouth daily at 6 PM. 12/09/20  Yes Pricilla Riffle, MD  eltrombopag (PROMACTA) 25 MG tablet Take 25 mg by mouth daily before breakfast.   Yes [provider]  Glucagon (GVOKE HYPOPEN [place]) Inject 1 Dose into the skin as needed (hypoglycemia).   Yes [provider]  insulin glargine (LANTUS) 100 UNIT/ML Solostar Pen Inject 19 Units into the skin every evening.   Yes [provider]  insulin lispro (HUMALOG KWIKPEN) 100 UNIT/ML KwikPen Inject 0-13 Units into the skin 3 (three) times daily before meals. Inject 0-13 units three times daily before meals per sliding scale: < 70 : 0 units 100-160 : 5 units 151-200 : 7 units 201-250 : 9 units 251-300 : 11 units > 301 : 13 units   Yes [provider]  levothyroxine (SYNTHROID) 75 MCG tablet Take 75 mcg by mouth daily before breakfast.   Yes [provider]  lisinopril (PRINIVIL,ZESTRIL) 20 MG tablet Take 1 tablet (20 mg total) by mouth daily. Patient taking differently: Take 10 mg by mouth every evening. 09/22/18 08/28/23 Yes Sainani, Rolly Pancake, MD  Multiple Vitamins-Minerals (MULTIVITAMIN WOMEN 50+) TABS Take 1 tablet by mouth every evening.   Yes [provider]  Insulin Syringes,  Disposable, U-100 0.3 ML MISC 100 each by Does not apply route daily. 10/05/22   Kathlen Mody, MD     Vital Signs: BP (!) 159/64   Pulse (!) 108   Temp 99 F (37.2 C)   Resp 20   Ht 5\' 2"  (1.575 m)   Wt 115 lb (52.2 kg)   SpO2 100%   BMI 21.03 kg/m   Physical Exam NAD, intubated/sedated Neuro exam largely deferred.  Moving left arm spontaneously.  No noted movement with the right side.  Groin: skin intact.  No erythema, warmth, or ecchymosis.  Soft.  No evidence of hematoma or pseudoaneurysm.   Imaging: ECHOCARDIOGRAM COMPLETE  Result Date: 07/05/2023    ECHOCARDIOGRAM REPORT   Patient Name:   Madeline [name] Date of Exam: 07/05/2023 Medical Rec #:  [id number]        Height:       62.0 in Accession #:    [id number]       Weight:       115.0 lb Date of Birth:  11/26/1933         BSA:          1.511 m Patient Age:    88 years         BP:           159/64 mmHg Patient Gender: F                HR:           111 bpm. Exam Location:  Inpatient Procedure: 2D Echo, Cardiac Doppler  and Color Doppler Indications:    Syncope R55  History:        Patient has prior history of Echocardiogram examinations, most                 recent 04/12/2023. CHF, CAD, Pacemaker, Signs/Symptoms:Altered                 Mental Status; Risk Factors:Dyslipidemia, Current Smoker and                 Diabetes.  Sonographer:    Aron Baba Referring Phys: [phone] JULIE ANNE Mayford Knife  Sonographer Comments: Echo performed with patient supine and on artificial respirator. Image acquisition challenging due to patient behavioral factors., Image acquisition challenging due to uncooperative patient and Image acquisition challenging due to respiratory motion. IMPRESSIONS  1. Severe MAC with moderate MS (mean gradient 9 mmHg).  2. Left ventricular ejection fraction, by estimation, is >75%. The left ventricle has hyperdynamic function. The left ventricle has no regional wall motion abnormalities. There is mild concentric left ventricular  hypertrophy. Left ventricular diastolic parameters are indeterminate. Elevated left atrial pressure.  3. Right ventricular systolic function is normal. The right ventricular size is normal. Tricuspid regurgitation signal is inadequate for assessing PA pressure.  4. The mitral valve is normal in structure. Trivial mitral valve regurgitation. Moderate mitral stenosis. Severe mitral annular calcification.  5. The aortic valve has an indeterminant number of cusps. Aortic valve regurgitation is not visualized. No aortic stenosis is present.  6. The inferior vena cava is normal in size with greater than 50% respiratory variability, suggesting right atrial pressure of 3 mmHg. FINDINGS  Left Ventricle: Left ventricular ejection fraction, by estimation, is >75%. The left ventricle has hyperdynamic function. The left ventricle has no regional wall motion abnormalities. The left ventricular internal cavity size was normal in size. There is mild concentric left ventricular hypertrophy. Left ventricular diastolic parameters are indeterminate. Elevated left atrial pressure. Right Ventricle: The right ventricular size is normal. Right ventricular systolic function is normal. Tricuspid regurgitation signal is inadequate for assessing PA pressure. The tricuspid regurgitant velocity is 1.66 m/s, and with an assumed right atrial  pressure of 8 mmHg, the estimated right ventricular systolic pressure is 19.0 mmHg. Left Atrium: Left atrial size was normal in size. Right Atrium: Right atrial size was normal in size. Pericardium: There is no evidence of pericardial effusion. Mitral Valve: The mitral valve is normal in structure. Severe mitral annular calcification. Trivial mitral valve regurgitation. Moderate mitral valve stenosis. Tricuspid Valve: The tricuspid valve is normal in structure. Tricuspid valve regurgitation is trivial. No evidence of tricuspid stenosis. Aortic Valve: The aortic valve has an indeterminant number of cusps.  Aortic valve regurgitation is not visualized. No aortic stenosis is present. Pulmonic Valve: The pulmonic valve was not well visualized. Pulmonic valve regurgitation is not visualized. No evidence of pulmonic stenosis. Aorta: The aortic root is normal in size and structure. Venous: The inferior vena cava is normal in size with greater than 50% respiratory variability, suggesting right atrial pressure of 3 mmHg. IAS/Shunts: No atrial level shunt detected by color flow Doppler. Additional Comments: Severe MAC with moderate MS (mean gradient 9 mmHg).  LEFT VENTRICLE PLAX 2D LVIDd:         3.80 cm   Diastology LVIDs:         2.40 cm   LV e' medial:    9.46 cm/s LV PW:         1.00 cm   LV  E/e' medial:  20.1 LV IVS:        0.80 cm   LV e' lateral:   14.60 cm/s LVOT diam:     1.60 cm   LV E/e' lateral: 13.0 LV SV:         33 LV SV Index:   22 LVOT Area:     2.01 cm  RIGHT VENTRICLE RV S prime:     13.10 cm/s TAPSE (M-mode): 1.2 cm LEFT ATRIUM           Index        RIGHT ATRIUM          Index LA diam:      3.10 cm 2.05 cm/m   RA Area:     9.86 cm LA Vol (A2C): 19.2 ml 12.71 ml/m  RA Volume:   19.40 ml 12.84 ml/m LA Vol (A4C): 29.3 ml 19.39 ml/m  AORTIC VALVE LVOT Vmax:   107.00 cm/s LVOT Vmean:  71.300 cm/s LVOT VTI:    0.163 m  AORTA Ao Root diam: 3.10 cm Ao Asc diam:  3.00 cm MITRAL VALVE                TRICUSPID VALVE MV Area (PHT): 4.17 cm     TR Peak grad:   11.0 mmHg MV Decel Time: 182 msec     TR Vmax:        166.00 cm/s MR Peak grad: 8.6 mmHg MR Vmax:      147.00 cm/s   SHUNTS MV E velocity: 190.00 cm/s  Systemic VTI:  0.16 m                             Systemic Diam: 1.60 cm Olga Millers MD Electronically signed by Olga Millers MD Signature Date/Time: 07/05/2023/10:28:12 AM    Final    Overnight EEG with video  Result Date: 07/05/2023 Charlsie Quest, MD     07/05/2023  9:47 AM Patient Name: Madeline [name] MRN: [id number] Epilepsy Attending: Charlsie Quest Referring Physician/Provider: Jefferson Fuel, MD Duration:07/04/2023 [phone] to 07/05/2023 0730 Patient history: 89yo F with ams getting eeg to evaluate for seizure Level of alertness: comatose AEDs during EEG study: Propofol Technical aspects: This EEG study was done with scalp electrodes positioned according to the 10-20 International system of electrode placement. Electrical activity was reviewed with band pass filter of 1-70Hz , sensitivity of 7 uV/mm, display speed of 13mm/sec with a 60Hz  notched filter applied as appropriate. EEG data were recorded continuously and digitally stored.  Video monitoring was available and reviewed as appropriate. Description: EEG showed near continuous generalized and lateralized left hemisphere polymorphic sharply contoured 3 to 6 Hz theta delta slowing with overriding generalized amplitude 13 to 15 Hz beta activity.  Sharply contoured waves with triphasic morphology also noted in right hemisphere, maximal right frontal region, predominantly when awake/stimulated.  Hyperventilation and photic stimulation were not performed.   ABNORMALITY - Continuous slow, generalized and lateralized left hemisphere IMPRESSION: This study is suggestive of cortical dysfunction in left hemisphere likely secondary to underlying structural abnormality.  Additionally there is moderate to severe diffuse encephalopathy, nonspecific etiology.  No seizures or definite epileptiform discharges were seen during the study. Charlsie Quest   DG CHEST PORT 1 VIEW  Result Date: 07/04/2023 CLINICAL DATA:  Endotracheal tube placement. EXAM: PORTABLE CHEST 1 VIEW COMPARISON:  07/03/2023 4:05 p.m. FINDINGS: Left-sided pacemaker unchanged. Endotracheal tube has tip 3.2 cm  above the carina. Nasogastric tube courses into the stomach and off the film as tip is not visualized. Lungs are adequately inflated without focal airspace consolidation or effusion. No pneumothorax. Cardiomediastinal silhouette and remainder of the exam is unchanged. IMPRESSION: 1.  No acute cardiopulmonary disease. 2. Endotracheal tube with tip 3.2 cm above the carina. Nasogastric tube courses into the stomach and off the film as tip is not visualized. Electronically Signed   By: Elberta Fortis M.D.   On: 07/04/2023 16:04   DG Abd 1 View  Result Date: 07/04/2023 CLINICAL DATA:  OG tube placement EXAM: ABDOMEN - 1 VIEW COMPARISON:  None Available. FINDINGS: Esophagogastric tube with tip and side port below the diaphragm. Nonobstructive pattern of included bowel gas. Excreted contrast in the urinary tract. IMPRESSION: Esophagogastric tube with tip and side port below the diaphragm. Electronically Signed   By: Jearld Lesch M.D.   On: 07/04/2023 16:03   CT ANGIO HEAD NECK W WO CM W PERF (CODE STROKE)  Result Date: 07/04/2023 CLINICAL DATA:  Right hemiplegia. EXAM: CT ANGIOGRAPHY HEAD AND NECK CT PERFUSION BRAIN TECHNIQUE: Multidetector CT imaging of the head and neck was performed using the standard protocol during bolus administration of intravenous contrast. Multiplanar CT image reconstructions and MIPs were obtained to evaluate the vascular anatomy. Carotid stenosis measurements (when applicable) are obtained utilizing NASCET criteria, using the distal internal carotid diameter as the denominator. Multiphase CT imaging of the brain was performed following IV bolus contrast injection. Subsequent parametric perfusion maps were calculated using RAPID software. RADIATION DOSE REDUCTION: This exam was performed according to the departmental dose-optimization program which includes automated exposure control, adjustment of the mA and/or kV according to patient size and/or use of iterative reconstruction technique. CONTRAST:  Contrast dose is not known on this in progress study. COMPARISON:  Head CT from earlier today FINDINGS: CTA NECK FINDINGS Aortic arch: Atheromatous plaque with 2 vessel branching Right carotid system: Mild for age calcified plaque at the bifurcation without stenosis or  ulceration Left carotid system: Mild to moderate calcified plaque at the bifurcation without stenosis or ulceration. ICA tortuosity below the skull base. The right ICA is smaller than the left, likely due to circle-of-Willis variant. Vertebral arteries: Proximal subclavian atherosclerosis with 90% narrowing on the left and 30% narrowing on the right. The vertebral arteries are smoothly contoured and widely patent throughout the neck. The left vertebral artery is dominant. Skeleton: No acute finding Other neck: No acute finding Upper chest: Biapical pleural based scarring.  No acute finding Review of the MIP images confirms the above findings CTA HEAD FINDINGS Anterior circulation: Atheromatous calcification of the carotid siphons. There is a hypoplastic right A1 segment which is very diminutive. Abrupt cut off of the left A1 segment although there is downstream filling. No MCA branch occlusion is seen. No vascular malformation or aneurysm. Posterior circulation: The vertebral and basilar arteries are widely patent. No branch occlusion, beading, or aneurysm. Venous sinuses: Unremarkable Anatomic variants: Unremarkable Review of the MIP images confirms the above findings CT Brain Perfusion Findings: CBF (<30%) Volume: 0mL Perfusion (Tmax>6.0s) volume: 88mL-but in a location consistent with artifact. The left ACA territory as highlighted using 18 max of 4 seconds IMPRESSION: 1. Abrupt cut off at the left A1 segment, acute appearing, with downstream reconstitution. The right A1 is hypoplastic. 2. 90% narrowing of the proximal left subclavian artery due to atheromatous plaque. No other flow reducing stenosis in the neck. 3. No infarct or penumbra by standard perfusion thresholds. There  is some delayed perfusion to the left ACA territory using a 4 second threshold. Electronically Signed   By: Tiburcio Pea M.D.   On: 07/04/2023 09:48   CT HEAD CODE STROKE WO CONTRAST  Result Date: 07/04/2023 CLINICAL DATA:  Code  stroke.  Right-sided hemiplegia. EXAM: CT HEAD WITHOUT CONTRAST TECHNIQUE: Contiguous axial images were obtained from the base of the skull through the vertex without intravenous contrast. RADIATION DOSE REDUCTION: This exam was performed according to the departmental dose-optimization program which includes automated exposure control, adjustment of the mA and/or kV according to patient size and/or use of iterative reconstruction technique. COMPARISON:  Head CT from yesterday FINDINGS: Brain: No evidence of acute infarction, hemorrhage, hydrocephalus, extra-axial collection or mass lesion/mass effect. Low-density in the cerebral white matter that is extensive. Chronic lacunar infarcts in the deep gray nuclei. Vascular: No hyperdense vessel or unexpected calcification. Skull: Normal. Negative for fracture or focal lesion. Sinuses/Orbits: No acute finding. Other: Prelim sent in epic chat ASPECTS [facility] Stroke Program Early CT Score) - Ganglionic level infarction (caudate, lentiform nuclei, internal capsule, insula, M1-M3 cortex): 7 - Supraganglionic infarction (M4-M6 cortex): 3 Total score (0-10 with 10 being normal): 10 IMPRESSION: 1. Stable head CT.  No acute finding. 2. Extensive chronic small vessel ischemia. Electronically Signed   By: Tiburcio Pea M.D.   On: 07/04/2023 09:36   DG Chest Portable 1 View  Result Date: 07/03/2023 CLINICAL DATA:  Syncope. EXAM: PORTABLE CHEST 1 VIEW COMPARISON:  Chest radiograph dated 04/30/2023. FINDINGS: The heart is mildly enlarged. Vascular calcifications are seen in the aortic arch. A left subclavian approach cardiac device is redemonstrated. The lungs are clear. There is no pleural effusion or pneumothorax. Degenerative changes are seen in the spine. IMPRESSION: Mild cardiomegaly.  No acute cardiopulmonary process. Electronically Signed   By: Romona Curls M.D.   On: 07/03/2023 16:29   CT CERVICAL SPINE WO CONTRAST  Result Date: 07/03/2023 CLINICAL DATA:  Fall.   Altered mental status.  Found down. EXAM: CT CERVICAL SPINE WITHOUT CONTRAST TECHNIQUE: Multidetector CT imaging of the cervical spine was performed without intravenous contrast. Multiplanar CT image reconstructions were also generated. RADIATION DOSE REDUCTION: This exam was performed according to the departmental dose-optimization program which includes automated exposure control, adjustment of the mA and/or kV according to patient size and/or use of iterative reconstruction technique. COMPARISON:  CT cervical spine 04/16/2022. FINDINGS: Alignment: Unchanged 3 mm retrolisthesis of C4 on C5. No traumatic malalignment. Skull base and vertebrae: No acute fracture. Normal craniocervical junction. No suspicious bone lesions. Multilevel degenerative endplate changes. Soft tissues and spinal canal: No prevertebral fluid or swelling. No visible canal hematoma. Disc levels: Multilevel cervical spondylosis, worst at C4-5, where there is mild spinal canal stenosis. Upper chest: No acute findings. Other: Atherosclerotic calcifications of the carotid bulbs. IMPRESSION: 1. No acute fracture or traumatic malalignment of the cervical spine. 2. Multilevel cervical spondylosis, worst at C4-5, where there is mild spinal canal stenosis. Electronically Signed   By: Orvan Falconer M.D.   On: 07/03/2023 14:56   CT HEAD CODE STROKE WO CONTRAST  Result Date: 07/03/2023 CLINICAL DATA:  Code stroke. Neuro deficit, acute, stroke suspected. Right-sided weakness. Aphasia. EXAM: CT HEAD WITHOUT CONTRAST TECHNIQUE: Contiguous axial images were obtained from the base of the skull through the vertex without intravenous contrast. RADIATION DOSE REDUCTION: This exam was performed according to the departmental dose-optimization program which includes automated exposure control, adjustment of the mA and/or kV according to patient size and/or use of  iterative reconstruction technique. COMPARISON:  Head CT 04/30/2023. FINDINGS: Brain: No acute  hemorrhage. Stable mild chronic small-vessel disease with old lacunar infarcts in the left caudate and right thalamus. Cortical gray-white differentiation is otherwise preserved. Prominence of the ventricles and sulci within expected range for age. No hydrocephalus or extra-axial collection. No mass effect or midline shift. Vascular: No hyperdense vessel or unexpected calcification. Skull: No calvarial fracture or suspicious bone lesion. Skull base is unremarkable. Sinuses/Orbits: No acute finding. Other: None. ASPECTS [facility] Stroke Program Early CT Score) - Ganglionic level infarction (caudate, lentiform nuclei, internal capsule, insula, M1-M3 cortex): 7 - Supraganglionic infarction (M4-M6 cortex): 3 Total score (0-10 with 10 being normal): 10 IMPRESSION: 1. No acute intracranial hemorrhage or evidence of acute large vessel territory infarct. ASPECT score is 10. 2. Stable mild chronic small-vessel disease with old lacunar infarcts in the left caudate and right thalamus. Code stroke imaging results were communicated on 07/03/2023 at 2:36 pm to provider Dr. Selina Cooley via secure text paging. Electronically Signed   By: Orvan Falconer M.D.   On: 07/03/2023 14:36    Labs:  CBC: Recent Labs    05/25/23 1109 07/03/23 1402 07/03/23 1422 07/04/23 0159 07/04/23 1542 07/05/23 0406  WBC 7.4 7.7  --  7.2  --  11.7*  HGB 12.5 11.5* 13.3 12.2 10.5* 10.8*  HCT 38.5 35.6* 39.0 37.5 31.0* 33.6*  PLT 369 429*  --  422*  --  417*    COAGS: Recent Labs    04/11/23 1258 07/03/23 1402  INR 1.0 1.0  APTT  --  27    BMP: Recent Labs    05/25/23 1109 07/03/23 1402 07/03/23 1422 07/04/23 0159 07/04/23 1542 07/05/23 0406  NA 136 141 142 136 139 142  K 3.9 4.2 4.1 4.1 3.7 4.1  CL 102 104 103 101  --  107  CO2 25 28  --  23  --  20*  GLUCOSE 249* 92 86 304*  --  230*  BUN 10 11 11 12   --  15  CALCIUM 8.6* 9.0  --  8.8*  --  8.6*  CREATININE 0.85 0.87 0.80 0.84  --  0.90  GFRNONAA >60 >60  --  >60  --   >60    LIVER FUNCTION TESTS: Recent Labs    04/11/23 1242 04/30/23 1430 05/25/23 1109 07/03/23 1402  BILITOT 0.7 0.7 0.4 0.6  AST 31 28 33 24  ALT 30 21 22 19   ALKPHOS 88 81 79 87  PROT 6.4* 6.4* 6.5 6.4*  ALBUMIN 3.5 3.7 3.6 3.3*    Assessment and Plan: L ACA occlusion s/p arteriogram without revascularization due to hard atherosclerotic plaque Patient remains intubated this AM.   Demonstrates movement of the left arm spontaneously. No purposeful movement.  R groin site intact without issue.  No hematoma or pseudoaneurysm.  Distal pulse intact.   No further needs related to IR.  Further care per Neuro/CCM.   Electronically Signed: Hoyt Koch, PA 07/05/2023, 12:19 PM   I spent a total of 15 Minutes at the the patient's bedside AND on the patient's hospital floor or unit, greater than 50% of which was counseling/coordinating care for L ACA occlusion.

## 2023-07-05 NOTE — Inpatient Diabetes Management (Addendum)
Inpatient Diabetes Program Recommendations  AACE/ADA: New Consensus Statement on Inpatient Glycemic Control (2015)  Target Ranges:  Prepandial:   less than 140 mg/dL      Peak postprandial:   less than 180 mg/dL (1-2 hours)      Critically ill patients:  140 - 180 mg/dL   Lab Results  Component Value Date   GLUCAP 230 (H) 07/05/2023   HGBA1C 9.2 (H) 04/11/2023    Review of Glycemic Control  Latest Reference Range & Units 07/04/23 07:44 07/04/23 15:03 07/04/23 19:22 07/04/23 23:22 07/05/23 03:31 07/05/23 07:45  Glucose-Capillary 70 - 99 mg/dL 161 (H) 096 (H) 045 (H) 259 (H) 211 (H) 230 (H)   Diabetes history: DM 2 Outpatient Diabetes medications: Lantus 19 units, Humalog 0-13 units tid Current orders for Inpatient glycemic control:  Semglee 5 units Daily Novolog 0-15 units Q4 hours  A1c 9.2% on 5/26  Inpatient Diabetes Program Recommendations:   Note: pt received Semglee 8 units yesterday and glucose trends are still above inpatient goal.  -    Increase Semglee to 10 units (1/2 home dose) -    Add Novolog 2 units tid meal coverage if eating >50% of meals  Thanks,  Christena Deem RN, MSN, BC-ADM Inpatient Diabetes Coordinator Team Pager [phone] (8a-5p)

## 2023-07-05 NOTE — Progress Notes (Addendum)
STROKE TEAM PROGRESS NOTE   BRIEF HPI [name] is a 89 y.o. female with history of diabetes, COPD, diabetic gastroparesis, hyperlipidemia, hypertension, pacemaker and hypothyroidism admitted yesterday after being found down between her bed and bedside table with mild L leg weakness, confusion, and mild dysarthria.  LKW 1008/17, no TNK or IR due to low NIH/mild symptoms, NIH 3.  SIGNIFICANT HOSPITAL EVENTS 8/17: admitted for stroke vs encephalopathy workup. CT negative. MRI was unable to be done over the weekend due to pacemaker. EEG was negative.  8/18: MD called for neuro change. NIH 20 on neurologists assessment.LKW 1440 day prior. CTA showed Left A1 occlusion, CTP showed slow flow, but no penumbra. She was taken to IR for thrombectomy. Multiple subsequent attempts also unsuccessful  due to the hard plaque  precluding distal access. CT of brain revealed no evidence of  hemorrhage complications  8/19: MRI shows large acute left ACA territory infarct with petechial hemorrhage. Unchanged left A1 occlusion.  LTM EEG negative, discontinued  INTERIM HISTORY/SUBJECTIVE Daughter at bedside.  On exam, patient is intubated, sedation has been off for 3 hours. She opens eyes to pain briefly, pupils reactive and midline. Flaccid RUE. Localizes with LUE, mitten in place. BLE withdraws to pain, L greater than R.   OBJECTIVE  CBC    Component Value Date/Time   WBC 11.7 (H) 07/05/2023 0406   RBC 3.68 (L) 07/05/2023 0406   HGB 10.8 (L) 07/05/2023 0406   HGB 11.3 12/09/2018 1308   HCT 33.6 (L) 07/05/2023 0406   HCT 33.1 (L) 12/09/2018 1308   PLT 417 (H) 07/05/2023 0406   PLT 52 (LL) 12/09/2018 1308   MCV 91.3 07/05/2023 0406   MCV 92 12/09/2018 1308   MCH 29.3 07/05/2023 0406   MCHC 32.1 07/05/2023 0406   RDW 13.0 07/05/2023 0406   RDW 12.2 12/09/2018 1308   LYMPHSABS 1.1 07/05/2023 0406   LYMPHSABS 2.0 12/09/2018 1308   MONOABS 0.8 07/05/2023 0406   EOSABS 0.0 07/05/2023 0406    EOSABS 0.2 12/09/2018 1308   BASOSABS 0.1 07/05/2023 0406   BASOSABS 0.0 12/09/2018 1308    BMET    Component Value Date/Time   NA 142 07/05/2023 0406   NA 143 12/09/2018 1308   K 4.1 07/05/2023 0406   CL 107 07/05/2023 0406   CO2 20 (L) 07/05/2023 0406   GLUCOSE 230 (H) 07/05/2023 0406   BUN 15 07/05/2023 0406   BUN 8 12/09/2018 1308   CREATININE 0.90 07/05/2023 0406   CREATININE 0.73 08/03/2016 1519   CALCIUM 8.6 (L) 07/05/2023 0406   GFRNONAA >60 07/05/2023 0406    IMAGING past 24 hours DG CHEST PORT 1 VIEW  Result Date: 07/04/2023 CLINICAL DATA:  Endotracheal tube placement. EXAM: PORTABLE CHEST 1 VIEW COMPARISON:  07/03/2023 4:05 p.m. FINDINGS: Left-sided pacemaker unchanged. Endotracheal tube has tip 3.2 cm above the carina. Nasogastric tube courses into the stomach and off the film as tip is not visualized. Lungs are adequately inflated without focal airspace consolidation or effusion. No pneumothorax. Cardiomediastinal silhouette and remainder of the exam is unchanged. IMPRESSION: 1. No acute cardiopulmonary disease. 2. Endotracheal tube with tip 3.2 cm above the carina. Nasogastric tube courses into the stomach and off the film as tip is not visualized. Electronically Signed   By: Elberta Fortis M.D.   On: 07/04/2023 16:04   DG Abd 1 View  Result Date: 07/04/2023 CLINICAL DATA:  OG tube placement EXAM: ABDOMEN - 1 VIEW COMPARISON:  None Available. FINDINGS:  Esophagogastric tube with tip and side port below the diaphragm. Nonobstructive pattern of included bowel gas. Excreted contrast in the urinary tract. IMPRESSION: Esophagogastric tube with tip and side port below the diaphragm. Electronically Signed   By: Jearld Lesch M.D.   On: 07/04/2023 16:03   CT ANGIO HEAD NECK W WO CM W PERF (CODE STROKE)  Result Date: 07/04/2023 CLINICAL DATA:  Right hemiplegia. EXAM: CT ANGIOGRAPHY HEAD AND NECK CT PERFUSION BRAIN TECHNIQUE: Multidetector CT imaging of the head and neck was  performed using the standard protocol during bolus administration of intravenous contrast. Multiplanar CT image reconstructions and MIPs were obtained to evaluate the vascular anatomy. Carotid stenosis measurements (when applicable) are obtained utilizing NASCET criteria, using the distal internal carotid diameter as the denominator. Multiphase CT imaging of the brain was performed following IV bolus contrast injection. Subsequent parametric perfusion maps were calculated using RAPID software. RADIATION DOSE REDUCTION: This exam was performed according to the departmental dose-optimization program which includes automated exposure control, adjustment of the mA and/or kV according to patient size and/or use of iterative reconstruction technique. CONTRAST:  Contrast dose is not known on this in progress study. COMPARISON:  Head CT from earlier today FINDINGS: CTA NECK FINDINGS Aortic arch: Atheromatous plaque with 2 vessel branching Right carotid system: Mild for age calcified plaque at the bifurcation without stenosis or ulceration Left carotid system: Mild to moderate calcified plaque at the bifurcation without stenosis or ulceration. ICA tortuosity below the skull base. The right ICA is smaller than the left, likely due to circle-of-Willis variant. Vertebral arteries: Proximal subclavian atherosclerosis with 90% narrowing on the left and 30% narrowing on the right. The vertebral arteries are smoothly contoured and widely patent throughout the neck. The left vertebral artery is dominant. Skeleton: No acute finding Other neck: No acute finding Upper chest: Biapical pleural based scarring.  No acute finding Review of the MIP images confirms the above findings CTA HEAD FINDINGS Anterior circulation: Atheromatous calcification of the carotid siphons. There is a hypoplastic right A1 segment which is very diminutive. Abrupt cut off of the left A1 segment although there is downstream filling. No MCA branch occlusion is  seen. No vascular malformation or aneurysm. Posterior circulation: The vertebral and basilar arteries are widely patent. No branch occlusion, beading, or aneurysm. Venous sinuses: Unremarkable Anatomic variants: Unremarkable Review of the MIP images confirms the above findings CT Brain Perfusion Findings: CBF (<30%) Volume: 0mL Perfusion (Tmax>6.0s) volume: 21mL-but in a location consistent with artifact. The left ACA territory as highlighted using 18 max of 4 seconds IMPRESSION: 1. Abrupt cut off at the left A1 segment, acute appearing, with downstream reconstitution. The right A1 is hypoplastic. 2. 90% narrowing of the proximal left subclavian artery due to atheromatous plaque. No other flow reducing stenosis in the neck. 3. No infarct or penumbra by standard perfusion thresholds. There is some delayed perfusion to the left ACA territory using a 4 second threshold. Electronically Signed   By: Tiburcio Pea M.D.   On: 07/04/2023 09:48   CT HEAD CODE STROKE WO CONTRAST  Result Date: 07/04/2023 CLINICAL DATA:  Code stroke.  Right-sided hemiplegia. EXAM: CT HEAD WITHOUT CONTRAST TECHNIQUE: Contiguous axial images were obtained from the base of the skull through the vertex without intravenous contrast. RADIATION DOSE REDUCTION: This exam was performed according to the departmental dose-optimization program which includes automated exposure control, adjustment of the mA and/or kV according to patient size and/or use of iterative reconstruction technique. COMPARISON:  Head CT from  yesterday FINDINGS: Brain: No evidence of acute infarction, hemorrhage, hydrocephalus, extra-axial collection or mass lesion/mass effect. Low-density in the cerebral white matter that is extensive. Chronic lacunar infarcts in the deep gray nuclei. Vascular: No hyperdense vessel or unexpected calcification. Skull: Normal. Negative for fracture or focal lesion. Sinuses/Orbits: No acute finding. Other: Prelim sent in epic chat ASPECTS  [facility] Stroke Program Early CT Score) - Ganglionic level infarction (caudate, lentiform nuclei, internal capsule, insula, M1-M3 cortex): 7 - Supraganglionic infarction (M4-M6 cortex): 3 Total score (0-10 with 10 being normal): 10 IMPRESSION: 1. Stable head CT.  No acute finding. 2. Extensive chronic small vessel ischemia. Electronically Signed   By: Tiburcio Pea M.D.   On: 07/04/2023 09:36    Vitals:   07/05/23 0700 07/05/23 0701 07/05/23 0800 07/05/23 0817  BP: (!) 171/63 (!) 167/64 (!) 145/52   Pulse: 100 (!) 101 (!) 101 (!) 104  Resp: (!) 21 (!) 22 20 (!) 22  Temp: 98.6 F (37 C) 98.6 F (37 C) 99.1 F (37.3 C) 99.1 F (37.3 C)  TempSrc: Esophageal     SpO2: (!) 89% (!) 89% 100% 100%  Weight:      Height:       PHYSICAL EXAM General:  Alert, well-nourished, well-developed patient in no acute distress Psych:  Mood and affect appropriate for situation CV: Regular rate and rhythm on monitor Respiratory:  Regular, unlabored respirations on room air GI: Abdomen soft and nontender  NEURO:  patient is intubated, sedation has been off for 3 hours.  She opens eyes to pain briefly, pupils reactive, small and midline.  Flaccid RUE. Localizes with LUE, mitten in place. BLE withdraws to pain, L greater than R.    ASSESSMENT/PLAN  Stroke - Acute Large Left ACA territory infarct and punctate small infarcts left cerebellum and b/l hemishpere with Left A1 Occlusion s/p unsuccessful IR Etiology:  large vessel disease, ddx including cardiembolic source vs. Procedure related embolic shower  Code Stroke CT head 8/18: Stable head CT. No acute finding.  CTA head & neck Abrupt cut of at left A1 segment, acute-appearing.  CT perfusion  No infarct or penumbra by standard perfusion thresholds. But on TTP > 4.0, penumbra shown on left ACA territory MRI /MRA Large acute left ACA territory infarct with petechial hemorrhage.  Subcentimeter acute infarcts in the left cerebellum and left greater than  right posterior cerebral hemispheres. Unchanged left A1 occlusion  2D Echo: Severe MAC with moderate MS, EF >75%, Mild LVH, No shunt.  Pacemaker interrogation pending EEG left frontotemporal focal area of cerebral dysfunction in the setting of more generalized region of cerebral dysfunction.  LDL pending HgbA1c pending UDS neg VTE prophylaxis - lovenox No antithrombotic prior to admission, now on aspirin 325 mg daily. Consider DAPT once no more procedure planned. Therapy recommendations:  pending Disposition:  pending  Acute Respiratory Failure, post-procedure CCM Management, appreciate their assistance Propofol gtt, Fentanyl IVP PRN Wean as tolerated VAP bundle  S/p Pacemaker for 3rd Degree heart block Coronary Artery Disease / STEMI s/p stenting 2012 Hypertension Home meds:  lisinopril 20mg , on hold Pacemaker placed 2020, [employer] dual-lead Pending interrogation BP goal < 180/105 given unsuccessful IR Long term BP goal normotensive  Hyperlipidemia Home meds:  lipitor 20mg  LDL pending, goal < 70 Now on lipitor 20 Continue statin at discharge  Diabetes type II  Home meds:  lantus, humalog HgbA1c pending, goal < 7.0 CBGs SSI On insulin  Recommend close follow-up with PCP for better DM control  Tobacco  Abuse Patient smokes 1 pack per day for 50 years Nicotine replacement therapy provided Cessation education will be provided  Other stroke risk factors Advance age  Other acute issues Hx of ITP on Promacta, follows with oncology, platelet 422->417 Leukocytosis WBC 7.2->11.7   Hospital day # 1   Pt seen by Neuro NP/APP and later by MD. Note/plan to be edited by MD as needed.    Lynnae January, DNP, AGACNP-BC [facility] Please use AMION for contact information & EPIC for messaging.  ATTENDING NOTE: I reviewed above note and agree with the assessment and plan. Pt was seen and examined.   Daughter at the bedside. Pt lying in bed, still intubated.  On exam, just off sedation, eyes close but briefly open on pain stimulation, not following commands. With forced eye opening, eyes in mid position, not blinking to visual threat, doll's eyes sluggish, not tracking, PERRL. Corneal reflex present, gag and cough present. Breathing over the vent.  Facial symmetry not able to test due to ET tube.  Tongue protrusion not cooperative. LUE spontaneous movement, RUE flaccid no movement on pain stimulation. BLE withdraw to pain, left more movement than right. Sensation, coordination and gait not tested.  Pt had left ACA ischemia on CTP but IR was unsuccessful. MRI showed left ASA large infarct along with b/l punctate embolic shower. Etiology still likely large vessel disease but cardioembolic source vs. Procedure related embolism not able to ruled out at this time. Now on ASA, will consider DAPT if no further procedure. Pacer interrogation pending. Continue statin. Wean off vent if able, appreciate CCM assistance. PT and OT pending.   I had long discussion with daughter at bedside, updated pt current condition, treatment plan and potential prognosis, and answered all the questions. She expressed understanding and appreciation. She do not think pt had living will or advanced directives but she stated that if pt needs to go to NH, she is OK with it.   For detailed assessment and plan, please refer to above/below as I have made changes wherever appropriate.   Marvel Plan, MD PhD Stroke Neurology 07/05/2023 6:35 PM  This patient is critically ill due to large left ACA stroke with left A1 occlusion, unsuccessful IR, respiratory failure and at significant risk of neurological worsening, death form recurrent stroke, hemorrhagic transformation, respiratory failure, seizure. This patient's care requires constant monitoring of vital signs, hemodynamics, respiratory and cardiac monitoring, review of multiple databases, neurological assessment, discussion with family, other  specialists and medical decision making of high complexity. I spent 45 minutes of neurocritical care time in the care of this patient. I discussed with CCM Dr. Celine Mans.    To contact Stroke Continuity provider, please refer to [email or web address]. After hours, contact General Neurology

## 2023-07-05 NOTE — Progress Notes (Signed)
Patient transported to MRI and back without complications. RN at bedside. 

## 2023-07-05 NOTE — Progress Notes (Signed)
  Echocardiogram 2D Echocardiogram has been performed.  Madeline [name] 07/05/2023, 10:05 AM

## 2023-07-05 NOTE — Procedures (Signed)
Patient Name: Madeline [name]  MRN: [id number]  Epilepsy Attending: Charlsie Quest  Referring Physician/Provider: Jefferson Fuel, MD  Duration:07/04/2023 [phone] to 07/05/2023 0730  Patient history: 89yo F with ams getting eeg to evaluate for seizure  Level of alertness: comatose  AEDs during EEG study: Propofol  Technical aspects: This EEG study was done with scalp electrodes positioned according to the 10-20 International system of electrode placement. Electrical activity was reviewed with band pass filter of 1-70Hz , sensitivity of 7 uV/mm, display speed of 67mm/sec with a 60Hz  notched filter applied as appropriate. EEG data were recorded continuously and digitally stored.  Video monitoring was available and reviewed as appropriate.  Description: EEG showed near continuous generalized and lateralized left hemisphere polymorphic sharply contoured 3 to 6 Hz theta delta slowing with overriding generalized amplitude 13 to 15 Hz beta activity.  Sharply contoured waves with triphasic morphology also noted in right hemisphere, maximal right frontal region, predominantly when awake/stimulated.  Hyperventilation and photic stimulation were not performed.     ABNORMALITY - Continuous slow, generalized and lateralized left hemisphere  IMPRESSION: This study is suggestive of cortical dysfunction in left hemisphere likely secondary to underlying structural abnormality.  Additionally there is moderate to severe diffuse encephalopathy, nonspecific etiology.  No seizures or definite epileptiform discharges were seen during the study.  [name]

## 2023-07-05 NOTE — Progress Notes (Addendum)
NAME:  ANAYLA MCEVERS, MRN:  [id number], DOB:  1934-02-17, LOS: 1 ADMISSION DATE:  07/03/2023, CONSULTATION DATE: 07/04/2023 REFERRING MD: Dr. Corliss Skains, Reason for consult: Ventilator dependence  History of Present Illness:  89 year old woman with diabetes, COPD, gastroparesis, hyperlipidemia, hypertension.  She has a pacemaker for third-degree heart block, ITP.  She was found on the floor by family on 8/17.  She was conscious but uncertain what had happened, whether she had fallen.  Subsequent head CT was negative for acute abnormality.  She had some sensation asymmetry and dysarthria.  She was admitted for neurological workup.  Then early morning 8/18 significant change in her neurological status.  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  She went for endovascular treatment and interventional radiology but revascularization was not possible due to hard atherosclerotic plaque.  Subsequent head CT did not show any evidence of hemorrhage or complication.  She returned to the ICU intubated and sedated.  Blood pressure goal 140-160.  Currently on propofol, Cleviprex 2  Pertinent  Medical History   Past Medical History:  Diagnosis Date   Bradycardia    Coronary artery disease    Diabetes mellitus    Dyslipidemia    GERD (gastroesophageal reflux disease)    Hypothyroid    Presence of permanent cardiac pacemaker 12/16/2018   [employer] dual lead pacemaker    Significant Hospital Events: Including procedures, antibiotic start and stop dates in addition to other pertinent events     Interim History / Subjective:  Off proprofol. Minimal vent settings. On cleviprex 2 mg/hr.  Was placed on SBT this am and had tachypnea RR in 30s.  No secretions  Objective   Blood pressure (!) 167/64, pulse (!) 104, temperature 99.1 F (37.3 C), resp. rate (!) 22, height 5\' 2"  (1.575 m), weight 52.2 kg, SpO2 100%.    Vent Mode: PRVC FiO2 (%):  [40 %-100 %] 40  % Set Rate:  [18 bmp] 18 bmp Vt Set:  [400 mL] 400 mL PEEP:  [5 cmH20] 5 cmH20 Plateau Pressure:  [14 cmH20-15 cmH20] 15 cmH20   Intake/Output Summary (Last 24 hours) at 07/05/2023 0820 Last data filed at 07/05/2023 0700 Gross per 24 hour  Intake 2143.95 ml  Output 1300 ml  Net 843.95 ml   Filed Weights   07/03/23 1450  Weight: 52.2 kg    Examination: Gen:      Intubated, sedated, elderly HEENT:  ETT to vent, LTM leads in place Lungs:    sounds of mechanical ventilation auscultated, no wheeze CV:         tachycardic, regular Abd:      + bowel sounds; soft, non-tender; no palpable masses, no distension Ext:    No edema Skin:      Warm and dry; no rashes Neuro:   RASS -2  Resolved Hospital Problem list     Assessment & Plan:  89 year old woman with ventilator dependence post procedure and due to impaired airway protection from CVA.  Unsuccessful revascularization in IR 8/18  Acute left anterior A1 CVA.  Unable to revascularize in IR due to plaque -Antiplatelets and anticoagulation as per neurology recommendations -Atorvastatin ordered -Echocardiogram ordered -Seizure precautions -Target blood pressure 140-160, will use either phenylephrine or Cleviprex if needed -Repeat imaging as per neurology ---> MRI planned this am.   Ventilator dependence due to impaired airway protection and following procedure -PRVC 8 cc/kg -Chest x-ray now -ABG and adjust minute ventilation, FiO2 appropriately -Sedation  as per PAD protocol, currently propofol with fentanyl available as needed -Plan to lighten sedation on 8/19 after any planned imaging to facilitate spontaneous breathing trial, assessment for extubation -VAP prevention order set -Pulmonary hygiene - will work on extubation following planned MRI today  Hypertension - cortrak placed. Hold home lisinopril - discussed with neurology, permissive hypertension goal SBP<180  Diabetes mellitus - on lantus 19 units at home.  Hyperglycemic here. Adding back low dose semglee with increased SSI.   History of ITP -On Promacta as an outpatient, currently on hold  Hypothyroidism - levythyroxine through cortrak  Nutrition -Will place cor-trac and initiate TF if she is not going to be extubated on 8/19    Best Practice (right click and "Reselect all SmartList Selections" daily)   Diet/type: NPO DVT prophylaxis: SCD GI prophylaxis: PPI Lines: N/A Foley:  Yes, and it is still needed Code Status:  full code Last date of multidisciplinary goals of care discussion [pending]  The patient is critically ill due to respiratory failure, stroke.  Critical care was necessary to treat or prevent imminent or life-threatening deterioration.  Critical care was time spent personally by me on the following activities: development of treatment plan with patient and/or surrogate as well as nursing, discussions with consultants, evaluation of patient's response to treatment, examination of patient, obtaining history from patient or surrogate, ordering and performing treatments and interventions, ordering and review of laboratory studies, ordering and review of radiographic studies, pulse oximetry, re-evaluation of patient's condition and participation in multidisciplinary rounds.   Critical Care Time devoted to patient care services described in this note is 39 minutes. This time reflects time of care of this signee Charlott Holler . This critical care time does not reflect separately billable procedures or procedure time, teaching time or supervisory time of PA/NP/Med student/Med Resident etc but could involve care discussion time.       Charlott Holler [facility] 07/05/2023 8:20 AM  Pager: see AMION  If no response to pager , please call critical care on call (see AMION) until 7pm After 7:00 pm call Elink

## 2023-07-05 NOTE — Progress Notes (Signed)
EEG D/C'd. Patient had no skin break down. [facility] notified.  

## 2023-07-06 DIAGNOSIS — I639 Cerebral infarction, unspecified: Secondary | ICD-10-CM | POA: Diagnosis not present

## 2023-07-06 DIAGNOSIS — R4182 Altered mental status, unspecified: Secondary | ICD-10-CM | POA: Diagnosis not present

## 2023-07-06 LAB — CBC
HCT: 32.9 % — ABNORMAL LOW (ref 36.0–46.0)
Hemoglobin: 10.7 g/dL — ABNORMAL LOW (ref 12.0–15.0)
MCH: 30.6 pg (ref 26.0–34.0)
MCHC: 32.5 g/dL (ref 30.0–36.0)
MCV: 94 fL (ref 80.0–100.0)
Platelets: 349 10*3/uL (ref 150–400)
RBC: 3.5 MIL/uL — ABNORMAL LOW (ref 3.87–5.11)
RDW: 13.2 % (ref 11.5–15.5)
WBC: 11.2 10*3/uL — ABNORMAL HIGH (ref 4.0–10.5)
nRBC: 0 % (ref 0.0–0.2)

## 2023-07-06 LAB — GLUCOSE, CAPILLARY
Glucose-Capillary: 154 mg/dL — ABNORMAL HIGH (ref 70–99)
Glucose-Capillary: 180 mg/dL — ABNORMAL HIGH (ref 70–99)
Glucose-Capillary: 174 mg/dL — ABNORMAL HIGH (ref 70–99)
Glucose-Capillary: 203 mg/dL — ABNORMAL HIGH (ref 70–99)
Glucose-Capillary: 195 mg/dL — ABNORMAL HIGH (ref 70–99)
Glucose-Capillary: 221 mg/dL — ABNORMAL HIGH (ref 70–99)

## 2023-07-06 LAB — HEMOGLOBIN A1C
Hgb A1c MFr Bld: 8.9 % — ABNORMAL HIGH (ref 4.8–5.6)
Mean Plasma Glucose: 208.73 mg/dL

## 2023-07-06 LAB — BASIC METABOLIC PANEL
Anion gap: 11 (ref 5–15)
BUN: 15 mg/dL (ref 8–23)
CO2: 21 mmol/L — ABNORMAL LOW (ref 22–32)
Calcium: 8.4 mg/dL — ABNORMAL LOW (ref 8.9–10.3)
Chloride: 110 mmol/L (ref 98–111)
Creatinine, Ser: 0.85 mg/dL (ref 0.44–1.00)
GFR, Estimated: >60 mL/min (ref >60)
Glucose, Bld: 153 mg/dL — ABNORMAL HIGH (ref 70–99)
Potassium: 3.9 mmol/L (ref 3.5–5.1)
Sodium: 142 mmol/L (ref 135–145)

## 2023-07-06 LAB — LIPID PANEL
Cholesterol: 130 mg/dL (ref 0–200)
HDL: 42 mg/dL (ref >40)
LDL Cholesterol: 69 mg/dL (ref 0–99)
Total CHOL/HDL Ratio: 3.1 RATIO
Triglycerides: 95 mg/dL (ref <150)
VLDL: 19 mg/dL (ref 0–40)

## 2023-07-06 LAB — PHOSPHORUS: Phosphorus: 3.5 mg/dL (ref 2.5–4.6)

## 2023-07-06 LAB — MAGNESIUM: Magnesium: 2 mg/dL (ref 1.7–2.4)

## 2023-07-06 MED ORDER — VITAL AF 1.2 CAL PO LIQD
1000.0000 mL | ORAL | Status: DC
  Administered 2023-07-06 – 2023-07-08 (×3): 1000 mL

## 2023-07-06 MED ORDER — ADULT MULTIVITAMIN W/MINERALS CH
1.0000 | ORAL_TABLET | Freq: Every day | ORAL | Status: DC
  Administered 2023-07-06 – 2023-07-09 (×4): 1
  Filled 2023-07-06 (×4): qty 1

## 2023-07-06 MED ORDER — ENOXAPARIN SODIUM 40 MG/0.4ML IJ SOSY
40.0000 mg | PREFILLED_SYRINGE | INTRAMUSCULAR | Status: DC
  Administered 2023-07-06 – 2023-07-08 (×3): 40 mg via SUBCUTANEOUS
  Filled 2023-07-06 (×3): qty 0.4

## 2023-07-06 MED ORDER — LABETALOL HCL 5 MG/ML IV SOLN
20.0000 mg | INTRAVENOUS | Status: DC | PRN
  Administered 2023-07-06 – 2023-07-09 (×4): 20 mg via INTRAVENOUS
  Filled 2023-07-06 (×4): qty 4

## 2023-07-06 NOTE — Progress Notes (Addendum)
STROKE TEAM PROGRESS NOTE   BRIEF HPI [name] is a 89 y.o. female with history of diabetes, COPD, diabetic gastroparesis, hyperlipidemia, hypertension, pacemaker and hypothyroidism admitted yesterday after being found down between her bed and bedside table with mild L leg weakness, confusion, and mild dysarthria.  LKW 1008/17, no TNK or IR due to low NIH/mild symptoms, NIH 3.  SIGNIFICANT HOSPITAL EVENTS 8/17: admitted for stroke vs encephalopathy workup. CT negative. MRI was unable to be done over the weekend due to pacemaker. EEG was negative.  8/18: MD called for neuro change. NIH 20 on neurologists assessment.LKW 1440 day prior. CTA showed Left A1 occlusion, CTP showed slow flow, but no penumbra. She was taken to IR for thrombectomy. Multiple subsequent attempts also unsuccessful  due to the hard plaque  precluding distal access. CT of brain revealed no evidence of  hemorrhage complications  8/19: MRI shows large acute left ACA territory infarct with petechial hemorrhage. Unchanged left A1 occlusion.  LTM EEG negative, discontinued 8/20: Weaning,   INTERIM HISTORY/SUBJECTIVE Family at bedside.  On exam, patient is intubated, sedation off. She opens eyes to pain briefly, pupils reactive and midline.  Flaccid RUE. Localizes with LUE, mitten in place. BLE withdraws to pain, L greater than R. Blink to threat on the left, not right. On wean- family to have a GOC discussion and update [place] on their wishes prior to extubation.   OBJECTIVE  CBC    Component Value Date/Time   WBC 11.2 (H) 07/06/2023 0438   RBC 3.50 (L) 07/06/2023 0438   HGB 10.7 (L) 07/06/2023 0438   HGB 11.3 12/09/2018 1308   HCT 32.9 (L) 07/06/2023 0438   HCT 33.1 (L) 12/09/2018 1308   PLT 349 07/06/2023 0438   PLT 52 (LL) 12/09/2018 1308   MCV 94.0 07/06/2023 0438   MCV 92 12/09/2018 1308   MCH 30.6 07/06/2023 0438   MCHC 32.5 07/06/2023 0438   RDW 13.2 07/06/2023 0438   RDW 12.2 12/09/2018 1308    LYMPHSABS 1.1 07/05/2023 0406   LYMPHSABS 2.0 12/09/2018 1308   MONOABS 0.8 07/05/2023 0406   EOSABS 0.0 07/05/2023 0406   EOSABS 0.2 12/09/2018 1308   BASOSABS 0.1 07/05/2023 0406   BASOSABS 0.0 12/09/2018 1308    BMET    Component Value Date/Time   NA 142 07/06/2023 0438   NA 143 12/09/2018 1308   K 3.9 07/06/2023 0438   CL 110 07/06/2023 0438   CO2 21 (L) 07/06/2023 0438   GLUCOSE 153 (H) 07/06/2023 0438   BUN 15 07/06/2023 0438   BUN 8 12/09/2018 1308   CREATININE 0.85 07/06/2023 0438   CREATININE 0.73 08/03/2016 1519   CALCIUM 8.4 (L) 07/06/2023 0438   GFRNONAA >60 07/06/2023 0438    IMAGING past 24 hours MR BRAIN WO CONTRAST  Result Date: 07/05/2023 CLINICAL DATA:  Stroke, follow-up. Left A1 occlusion status post unsuccessful attempted endovascular revascularization. EXAM: MRI HEAD WITHOUT CONTRAST MRA HEAD WITHOUT CONTRAST TECHNIQUE: Multiplanar, multi-echo pulse sequences of the brain and surrounding structures were acquired without intravenous contrast. Angiographic images of the Circle of Willis were acquired using MRA technique without intravenous contrast. COMPARISON:  Head CT and CTA 07/04/2023 FINDINGS: MRI HEAD FINDINGS Brain: There is a large acute left ACA territory infarct involving the parasagittal frontal and parietal lobes including cingulate gyrus as well as the anterior left basal ganglia. There is associated petechial hemorrhage and cytotoxic edema with very mild mass effect, mainly on the frontal horn of the left  lateral ventricle. There is no midline shift. Subcentimeter acute infarcts are also present in the left cerebellar hemisphere, left temporal and occipital lobes, and right parietal periventricular white matter. Scattered chronic cerebral microhemorrhages are noted bilaterally. There are small chronic infarcts in the deep gray nuclei bilaterally, right occipital lobe, and both cerebellar hemispheres. Patchy T2 hyperintensities elsewhere in the cerebral  white matter and pons are nonspecific but compatible with moderately extensive chronic small vessel ischemic disease. There is mild cerebral atrophy. Vascular: Reported below. Skull and upper cervical spine: No suspicious marrow lesion. Sinuses/Orbits: Bilateral cataract extraction. Paranasal sinuses and mastoid air cells are clear. Other: None. MRA HEAD FINDINGS Anterior circulation: The internal carotid arteries are patent from skull base to carotid termini without evidence of significant stenosis. The right A1 segment is hypoplastic. There is unchanged occlusion of the proximal left A1 segment with A2 reconstitution. The MCAs are patent without evidence of a proximal branch occlusion or flow limiting proximal stenosis. No aneurysm is identified. Posterior circulation: The intracranial vertebral arteries are widely patent to the basilar with the left being dominant. The basilar artery is widely patent. Patent SCA origins are seen bilaterally. There is a patent left posterior communicating artery. Both PCAs are patent without evidence of a significant proximal stenosis. No aneurysm is identified. Anatomic variants: None. IMPRESSION: 1. Large acute left ACA territory infarct with petechial hemorrhage. Unchanged left A1 occlusion. 2. Subcentimeter acute infarcts in the left cerebellum and left greater than right posterior cerebral hemispheres. 3. Moderately extensive chronic small vessel ischemic disease with multiple chronic infarcts as above. Electronically Signed   By: Sebastian Ache M.D.   On: 07/05/2023 15:26   MR ANGIO HEAD WO CONTRAST  Result Date: 07/05/2023 CLINICAL DATA:  Stroke, follow-up. Left A1 occlusion status post unsuccessful attempted endovascular revascularization. EXAM: MRI HEAD WITHOUT CONTRAST MRA HEAD WITHOUT CONTRAST TECHNIQUE: Multiplanar, multi-echo pulse sequences of the brain and surrounding structures were acquired without intravenous contrast. Angiographic images of the Circle of  Willis were acquired using MRA technique without intravenous contrast. COMPARISON:  Head CT and CTA 07/04/2023 FINDINGS: MRI HEAD FINDINGS Brain: There is a large acute left ACA territory infarct involving the parasagittal frontal and parietal lobes including cingulate gyrus as well as the anterior left basal ganglia. There is associated petechial hemorrhage and cytotoxic edema with very mild mass effect, mainly on the frontal horn of the left lateral ventricle. There is no midline shift. Subcentimeter acute infarcts are also present in the left cerebellar hemisphere, left temporal and occipital lobes, and right parietal periventricular white matter. Scattered chronic cerebral microhemorrhages are noted bilaterally. There are small chronic infarcts in the deep gray nuclei bilaterally, right occipital lobe, and both cerebellar hemispheres. Patchy T2 hyperintensities elsewhere in the cerebral white matter and pons are nonspecific but compatible with moderately extensive chronic small vessel ischemic disease. There is mild cerebral atrophy. Vascular: Reported below. Skull and upper cervical spine: No suspicious marrow lesion. Sinuses/Orbits: Bilateral cataract extraction. Paranasal sinuses and mastoid air cells are clear. Other: None. MRA HEAD FINDINGS Anterior circulation: The internal carotid arteries are patent from skull base to carotid termini without evidence of significant stenosis. The right A1 segment is hypoplastic. There is unchanged occlusion of the proximal left A1 segment with A2 reconstitution. The MCAs are patent without evidence of a proximal branch occlusion or flow limiting proximal stenosis. No aneurysm is identified. Posterior circulation: The intracranial vertebral arteries are widely patent to the basilar with the left being dominant. The basilar artery is  widely patent. Patent SCA origins are seen bilaterally. There is a patent left posterior communicating artery. Both PCAs are patent without  evidence of a significant proximal stenosis. No aneurysm is identified. Anatomic variants: None. IMPRESSION: 1. Large acute left ACA territory infarct with petechial hemorrhage. Unchanged left A1 occlusion. 2. Subcentimeter acute infarcts in the left cerebellum and left greater than right posterior cerebral hemispheres. 3. Moderately extensive chronic small vessel ischemic disease with multiple chronic infarcts as above. Electronically Signed   By: Sebastian Ache M.D.   On: 07/05/2023 15:26   ECHOCARDIOGRAM COMPLETE  Result Date: 07/05/2023    ECHOCARDIOGRAM REPORT   Patient Name:   Madeline [name] Date of Exam: 07/05/2023 Medical Rec #:  [id number]        Height:       62.0 in Accession #:    [id number]       Weight:       115.0 lb Date of Birth:  1934-09-07         BSA:          1.511 m Patient Age:    88 years         BP:           159/64 mmHg Patient Gender: F                HR:           111 bpm. Exam Location:  Inpatient Procedure: 2D Echo, Cardiac Doppler and Color Doppler Indications:    Syncope R55  History:        Patient has prior history of Echocardiogram examinations, most                 recent 04/12/2023. CHF, CAD, Pacemaker, Signs/Symptoms:Altered                 Mental Status; Risk Factors:Dyslipidemia, Current Smoker and                 Diabetes.  Sonographer:    Aron Baba Referring Phys: [phone] JULIE ANNE Mayford Knife  Sonographer Comments: Echo performed with patient supine and on artificial respirator. Image acquisition challenging due to patient behavioral factors., Image acquisition challenging due to uncooperative patient and Image acquisition challenging due to respiratory motion. IMPRESSIONS  1. Severe MAC with moderate MS (mean gradient 9 mmHg).  2. Left ventricular ejection fraction, by estimation, is >75%. The left ventricle has hyperdynamic function. The left ventricle has no regional wall motion abnormalities. There is mild concentric left ventricular hypertrophy. Left ventricular  diastolic parameters are indeterminate. Elevated left atrial pressure.  3. Right ventricular systolic function is normal. The right ventricular size is normal. Tricuspid regurgitation signal is inadequate for assessing PA pressure.  4. The mitral valve is normal in structure. Trivial mitral valve regurgitation. Moderate mitral stenosis. Severe mitral annular calcification.  5. The aortic valve has an indeterminant number of cusps. Aortic valve regurgitation is not visualized. No aortic stenosis is present.  6. The inferior vena cava is normal in size with greater than 50% respiratory variability, suggesting right atrial pressure of 3 mmHg. FINDINGS  Left Ventricle: Left ventricular ejection fraction, by estimation, is >75%. The left ventricle has hyperdynamic function. The left ventricle has no regional wall motion abnormalities. The left ventricular internal cavity size was normal in size. There is mild concentric left ventricular hypertrophy. Left ventricular diastolic parameters are indeterminate. Elevated left atrial pressure. Right Ventricle: The right ventricular size is normal. Right ventricular systolic function  is normal. Tricuspid regurgitation signal is inadequate for assessing PA pressure. The tricuspid regurgitant velocity is 1.66 m/s, and with an assumed right atrial  pressure of 8 mmHg, the estimated right ventricular systolic pressure is 19.0 mmHg. Left Atrium: Left atrial size was normal in size. Right Atrium: Right atrial size was normal in size. Pericardium: There is no evidence of pericardial effusion. Mitral Valve: The mitral valve is normal in structure. Severe mitral annular calcification. Trivial mitral valve regurgitation. Moderate mitral valve stenosis. Tricuspid Valve: The tricuspid valve is normal in structure. Tricuspid valve regurgitation is trivial. No evidence of tricuspid stenosis. Aortic Valve: The aortic valve has an indeterminant number of cusps. Aortic valve regurgitation is not  visualized. No aortic stenosis is present. Pulmonic Valve: The pulmonic valve was not well visualized. Pulmonic valve regurgitation is not visualized. No evidence of pulmonic stenosis. Aorta: The aortic root is normal in size and structure. Venous: The inferior vena cava is normal in size with greater than 50% respiratory variability, suggesting right atrial pressure of 3 mmHg. IAS/Shunts: No atrial level shunt detected by color flow Doppler. Additional Comments: Severe MAC with moderate MS (mean gradient 9 mmHg).  LEFT VENTRICLE PLAX 2D LVIDd:         3.80 cm   Diastology LVIDs:         2.40 cm   LV e' medial:    9.46 cm/s LV PW:         1.00 cm   LV E/e' medial:  20.1 LV IVS:        0.80 cm   LV e' lateral:   14.60 cm/s LVOT diam:     1.60 cm   LV E/e' lateral: 13.0 LV SV:         33 LV SV Index:   22 LVOT Area:     2.01 cm  RIGHT VENTRICLE RV S prime:     13.10 cm/s TAPSE (M-mode): 1.2 cm LEFT ATRIUM           Index        RIGHT ATRIUM          Index LA diam:      3.10 cm 2.05 cm/m   RA Area:     9.86 cm LA Vol (A2C): 19.2 ml 12.71 ml/m  RA Volume:   19.40 ml 12.84 ml/m LA Vol (A4C): 29.3 ml 19.39 ml/m  AORTIC VALVE LVOT Vmax:   107.00 cm/s LVOT Vmean:  71.300 cm/s LVOT VTI:    0.163 m  AORTA Ao Root diam: 3.10 cm Ao Asc diam:  3.00 cm MITRAL VALVE                TRICUSPID VALVE MV Area (PHT): 4.17 cm     TR Peak grad:   11.0 mmHg MV Decel Time: 182 msec     TR Vmax:        166.00 cm/s MR Peak grad: 8.6 mmHg MR Vmax:      147.00 cm/s   SHUNTS MV E velocity: 190.00 cm/s  Systemic VTI:  0.16 m                             Systemic Diam: 1.60 cm Olga Millers MD Electronically signed by Olga Millers MD Signature Date/Time: 07/05/2023/10:28:12 AM    Final     Vitals:   07/06/23 0700 07/06/23 0747 07/06/23 0800 07/06/23 0900  BP: (!) 152/56 (!) 177/66 (!) 176/68 Marland Kitchen)  190/76  Pulse: 74 85 86 93  Resp: 18 20 (!) 32 20  Temp:   98.4 F (36.9 C)   TempSrc:   Axillary   SpO2: 100% 100% 100% 100%  Weight:       Height:       PHYSICAL EXAM General:  Alert, well-nourished, well-developed patient in no acute distress Psych:  Mood and affect appropriate for situation CV: Regular rate and rhythm on monitor Respiratory:  Regular, unlabored respirations on room air GI: Abdomen soft and nontender  NEURO:  patient is intubated, sedation off She opens eyes to pain briefly, pupils reactive, small and midline.  Flaccid RUE. Localizes with LUE, mitten in place. BLE withdraws to pain, L greater than R.    ASSESSMENT/PLAN  Stroke - Acute Large Left ACA territory infarct and punctate small infarcts left cerebellum and b/l hemishpere with Left A1 Occlusion s/p unsuccessful IR Etiology:  large vessel disease, ddx including cardiembolic source vs. Procedure related embolic shower  Code Stroke CT head 8/18: Stable head CT. No acute finding.  CTA head & neck Abrupt cut of at left A1 segment, acute-appearing.  CT perfusion  No infarct or penumbra by standard perfusion thresholds. But on TTP > 4.0, penumbra shown on left ACA territory MRI /MRA Large acute left ACA territory infarct with petechial hemorrhage.  Subcentimeter acute infarcts in the left cerebellum and left greater than right posterior cerebral hemispheres. Unchanged left A1 occlusion  2D Echo: Severe MAC with moderate MS, EF >75%, Mild LVH, No shunt.  Pacemaker interrogation no afib EEG left frontotemporal focal area of cerebral dysfunction in the setting of more generalized region of cerebral dysfunction.  LDL 69 HgbA1c 8.9 UDS neg VTE prophylaxis - lovenox No antithrombotic prior to admission, now on aspirin 325 mg daily. Consider DAPT once no more procedure planned. Therapy recommendations:  pending Disposition:  pending  Acute Respiratory Failure, post-procedure CCM Management, appreciate their assistance Propofol gtt, Fentanyl IVP PRN Wean as tolerated VAP bundle Not candidate for extubation today Need family discussion about  GOC  S/p Pacemaker for 3rd Degree heart block Coronary Artery Disease / STEMI s/p stenting 2012 Hypertension Home meds:  lisinopril 20mg , on hold Pacemaker placed 2020, [employer] dual-lead No afib found  BP goal < 180/105 given unsuccessful IR Long term BP goal normotensive  Hyperlipidemia Home meds:  lipitor 20mg  LDL 69, goal < 70 Now on lipitor 20 Continue statin at discharge  Diabetes type II  Home meds:  lantus, humalog HgbA1c 8.9, goal < 7.0 CBGs SSI On insulin  Recommend close follow-up with PCP for better DM control  Tobacco Abuse Patient smokes 1 pack per day for 50 years Nicotine replacement therapy provided Cessation education will be provided  Other stroke risk factors Advance age  Other acute issues Hx of ITP on Promacta, follows with oncology, platelet 422->417 Leukocytosis WBC 7.2->11.7   Hospital day # 2   Patient seen and examined by NP/APP with MD. MD to update note as needed.   Elmer Picker, DNP, FNP-BC [facility] Pager: [phone]  ATTENDING NOTE: I reviewed above note and agree with the assessment and plan. Pt was seen and examined.   Daughter at the bedside. Pt lying in bed, still intubated, on weaning, however, not candidate for extubation. Neuro unchanged, open eyes on voice, not following commands, RUE flaccid, RLE withdraw to pain. Discussed with daughter about pt possible poor functional outcome, daughter would like to discuss with her sister and brothers. Continue ASA and statin.  For detailed assessment and plan, please refer to above/below as I have made changes wherever appropriate.   Marvel Plan, MD PhD Stroke Neurology 07/06/2023 7:23 PM  This patient is critically ill due to large left ACA stroke with left A1 occlusion, unsuccessful IR, respiratory failure and at significant risk of neurological worsening, death form recurrent stroke, hemorrhagic transformation, respiratory failure, seizure. This patient's  care requires constant monitoring of vital signs, hemodynamics, respiratory and cardiac monitoring, review of multiple databases, neurological assessment, discussion with family, other specialists and medical decision making of high complexity. I spent 45 minutes of neurocritical care time in the care of this patient. I discussed with CCM Dr. Celine Mans.

## 2023-07-06 NOTE — TOC CM/SW Note (Signed)
Transition of Care [facility]) - Inpatient Brief Assessment   Patient Details  Name: Madeline [name] MRN: [id number] Date of Birth: [birth year]  Transition of Care [facility]) CM/SW Contact:    Glennon Mac, RN Phone Number: 07/06/2023, 5:01 PM   Clinical Narrative: [name] is a 89 y.o. female with history of diabetes, COPD, diabetic gastroparesis, hyperlipidemia, hypertension, pacemaker and hypothyroidism admitted 07/03/23 after being found down between her bed and bedside table with mild L leg weakness, confusion, and mild dysarthria.  LKW 1008/17, no TNK or IR due to low NIH/mild symptoms, NIH 3.  PTA, pt mostly independent and living at home with daughter. Currently remains intubated; TOC will follow progress.    Transition of Care Asessment: Insurance and Status: Insurance coverage has been reviewed Patient has primary care physician: Yes (Dr.Sparks) Home environment has been reviewed: Lives with daughter Prior level of function:: Mostly Independent, daughter helps with insulin administration Prior/Current Home Services: No current home services Social Determinants of Health Reivew: SDOH reviewed no interventions necessary Readmission risk has been reviewed: Yes Transition of care needs: transition of care needs identified, TOC will continue to follow  Quintella Baton, RN, BSN  Trauma/Neuro ICU Case Manager [phone]

## 2023-07-06 NOTE — Progress Notes (Signed)
NAME:  ELEXA LATZKE, MRN:  [id number], DOB:  [birth year], LOS: 2 ADMISSION DATE:  07/03/2023, CONSULTATION DATE: 07/04/2023 REFERRING MD: Dr. Corliss Skains, Reason for consult: Ventilator dependence  History of Present Illness:  89 year old woman with diabetes, COPD, gastroparesis, hyperlipidemia, hypertension.  She has a pacemaker for third-degree heart block, ITP.  She was found on the floor by family on 8/17.  She was conscious but uncertain what had happened, whether she had fallen.  Subsequent head CT was negative for acute abnormality.  She had some sensation asymmetry and dysarthria.  She was admitted for neurological workup.  Then early morning 8/18 significant change in her neurological status.  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  She went for endovascular treatment and interventional radiology but revascularization was not possible due to hard atherosclerotic plaque.  Subsequent head CT did not show any evidence of hemorrhage or complication.  She returned to the ICU intubated and sedated.  Blood pressure goal 140-160.  Currently on propofol, Cleviprex 2  Pertinent  Medical History   Past Medical History:  Diagnosis Date   Bradycardia    Coronary artery disease    Diabetes mellitus    Dyslipidemia    GERD (gastroesophageal reflux disease)    Hypothyroid    Presence of permanent cardiac pacemaker 12/16/2018   [employer]tronic dual lead pacemaker    Significant Hospital Events: Including procedures, antibiotic start and stop dates in addition to other pertinent events     Interim History / Subjective:  Off propofol, off cleviprex.  Daughter at bedside.  On PS trial this am 10/5  Objective   Blood pressure (!) 190/76, pulse 93, temperature 98.4 F (36.9 C), temperature source Axillary, resp. rate 20, height 5\' 2"  (1.575 m), weight 52.2 kg, SpO2 100%.    Vent Mode: CPAP;PSV FiO2 (%):  [40 %] 40 % Set Rate:  [18 bmp] 18 bmp Vt  Set:  [400 mL] 400 mL PEEP:  [5 cmH20] 5 cmH20 Pressure Support:  [10 cmH20] 10 cmH20 Plateau Pressure:  [14 cmH20-16 cmH20] 14 cmH20   Intake/Output Summary (Last 24 hours) at 07/06/2023 0956 Last data filed at 07/06/2023 0900 Gross per 24 hour  Intake 1836.09 ml  Output 515 ml  Net 1321.09 ml   Filed Weights   07/03/23 1450  Weight: 52.2 kg    Examination: Gen:      Intubated, sedated, acutely and chronically ill appearing HEENT:  ETT to vent Lungs:    sounds of mechanical ventilation auscultated, no wheeze CV:         RRR Abd:      + bowel sounds; soft, non-tender; no palpable masses, no distension Ext:    No edema Skin:      Warm and dry; no rashes Neuro:   RASS -2, moves LUE, flaccid on right.    Resolved Hospital Problem list     Assessment & Plan:  89 year old woman with ventilator dependence post procedure and due to impaired airway protection from CVA.  Unsuccessful revascularization in IR 8/18  Acute left anterior A1 CVA.  Unable to revascularize in IR due to plaque -Antiplatelets and anticoagulation as per neurology recommendations -Atorvastatin ordered -Echocardiogram ordered -Seizure precautions -Target blood pressure 140-160, will use either phenylephrine or Cleviprex if needed -Repeat imaging as per neurology ---> MRI planned this am.   Ventilator dependence due to impaired airway protection and following procedure Tobacco use disorder/presumed COPD -PRVC 8 cc/kg -Chest x-ray now -ABG  and adjust minute ventilation, FiO2 appropriately -Sedation as per PAD protocol, currently propofol with fentanyl available as needed - daily SAT/SBT - will hold continuous sedation. Breathing spontaneously but mental status barrier to extubation  Hypertension - cortrak placed. Hold home lisinopril - discussed with neurology, permissive hypertension goal SBP<180  Diabetes mellitus Type 1 - continue lantus, SSI - CBGs at goal  History of ITP -On Promacta as an  outpatient, currently on hold  Hypothyroidism - levythyroxine through cortrak  Nutrition - tube feeds    Best Practice (right click and "Reselect all SmartList Selections" daily)   Diet/type: NPO with tube feeds DVT prophylaxis: SCD GI prophylaxis: PPI Lines: N/A Foley:  Yes, and it is still needed - will assess for removal today.  Code Status:  full code Last date of multidisciplinary goals of care discussion [updated daughter at bedside 8/20 morning. Discussed possibility of tracheostomy and longterm critical illness. Also discussed most likely disposition as nursing home in best case scenario. At baseline patient needed near 24/7 assistance as she lives at home with her husband who has advanced alzheimer's disease. She had recently quit smoking. ]  The patient is critically ill due to stroke, respiratory failure.  Critical care was necessary to treat or prevent imminent or life-threatening deterioration.  Critical care was time spent personally by me on the following activities: development of treatment plan with patient and/or surrogate as well as nursing, discussions with consultants, evaluation of patient's response to treatment, examination of patient, obtaining history from patient or surrogate, ordering and performing treatments and interventions, ordering and review of laboratory studies, ordering and review of radiographic studies, pulse oximetry, re-evaluation of patient's condition and participation in multidisciplinary rounds.   Critical Care Time devoted to patient care services described in this note is 50 minutes. This time reflects time of care of this signee Charlott Holler . This critical care time does not reflect separately billable procedures or procedure time, teaching time or supervisory time of PA/NP/Med student/Med Resident etc but could involve care discussion time.       Mickel Baas [facility] 07/06/2023 9:56 AM  Pager: see  AMION  If no response to pager , please call critical care on call (see AMION) until 7pm After 7:00 pm call Elink

## 2023-07-06 NOTE — Progress Notes (Signed)
Pt vent settings switched back to full support at this time. RT aware.  Robina Ade, RN

## 2023-07-06 NOTE — Progress Notes (Signed)
Initial Nutrition Assessment  DOCUMENTATION CODES:   Not applicable  INTERVENTION:  Initiate tube feeds via OGT: -Initiate Vital AF 1.2 at 20 mL/hour and advance by 10 mL/hour every 8 hours to goal rate of 50 mL/hour (1200 mL goal daily volume) -Provides: 1440 kcal, 90 grams of protein, 972 mL H2O daily  Provide multivitamin with minerals po daily per tube.  NUTRITION DIAGNOSIS:   Inadequate oral intake related to inability to eat (intubated) as evidenced by NPO status.  GOAL:   Patient will meet greater than or equal to 90% of their needs  MONITOR:   Vent status, Labs, Weight trends, TF tolerance, Skin, I & O's  REASON FOR ASSESSMENT:   Ventilator, Consult Enteral/tube feeding initiation and management  ASSESSMENT:   89 year old female with PMHx of DM, COPD, gastroparesis, HLD, HTN, pacemaker for third-degree hart block, ITP admitted with acute left anterior A1 CVA (unable to revascularize in IR due to plaque).  Met with patient's daughter at bedside. Pt remains intubated. Propofol gtt and Cleviprex were stopped. Daughter reports she has been living with patient and her husband lately after recent hospitalization to provide 24/7 supervision/care. Patient typically has a good appetite and intake at baseline. She eats 3 meals per day. For breakfast she has scrambled eggs and toast with apple butter or oatmeal. Very rarely will have cereal. For lunch and dinner she has a soft protein (meat loaf or shredded chicken) with a variety of vegetables. Denies food allergies or intolerances. Denies nausea or emesis at baseline (noted history of gastroparesis) or any abdominal pain. Reports pt has upper and lower dentures at home. Daughter reports pt is able to ambulate without walker, but does take small, quick steps when walking.  Daughter reports pt's UBW was 115 lbs and she has not had any unintentional weight loss. She reports pt has actually likely gained a few lbs since daughter has  been living there. Suspect admission wt was reported and not truly measured.  Patient is currently intubated on ventilator support MV: 8.3 L/min Temp (24hrs), Avg:99.6 F (37.6 C), Min:97.3 F (36.3 C), Max:101.2 F (38.4 C)  Propofol: N/A - now stopped  Medications reviewed and include: Colace 100 mg BID, Novolog 0-15 units Q4hrs, Semglee 5 units daily, levothyroxine, pantoprazole, NS at 75 mL/hour  Labs reviewed: CBG 134-226  Enteral Access: OGT placed 8/18; tip and side port below diaphragm per abdominal x-ray 8/18; tube courses into stomach and tip not visualized on chest x-ray 8/18  UOP: 515 mL (0.4 mL/kg/hr) in previous 24 hrs  I/O: +2173.4 mL since admission  Discussed with MD via secure chat. Plan is to initiate tube feeds today.  NUTRITION - FOCUSED PHYSICAL EXAM:  Flowsheet Row Most Recent Value  Orbital Region No depletion  Upper Arm Region No depletion  Thoracic and Lumbar Region No depletion  Buccal Region Unable to assess  Temple Region Mild depletion  Clavicle Bone Region Mild depletion  Clavicle and Acromion Bone Region Moderate depletion  Scapular Bone Region Unable to assess  Dorsal Hand Mild depletion  Patellar Region Moderate depletion  Anterior Thigh Region Moderate depletion  Posterior Calf Region Moderate depletion  Edema (RD Assessment) Mild  Hair Reviewed  Eyes Unable to assess  Mouth Unable to assess  [daughter reports pt has upper and lower dentures at home]  Skin Reviewed  Nails Reviewed      Diet Order:   Diet Order             Diet  NPO time specified  Diet effective now                  EDUCATION NEEDS:   No education needs have been identified at this time  Skin:  Skin Assessment: Reviewed RN Assessment  Last BM:  07/05/23 - large type 6  Height:   Ht Readings from Last 1 Encounters:  07/03/23 5\' 2"  (1.575 m)   Weight:   Wt Readings from Last 1 Encounters:  07/03/23 52.2 kg   Ideal Body Weight:  50 kg  BMI:   Body mass index is 21.03 kg/m.  Estimated Nutritional Needs:   Kcal:  1400-1600  Protein:  75-90 grams  Fluid:  1.4-1.6 L/day  Letta Median, MS, RD, LDN, CNSC Pager number available on Amion

## 2023-07-06 NOTE — Progress Notes (Signed)
eLink Physician-Brief Progress Note Patient Name: Madeline [name] DOB: June 23, 1934 MRN: [id number]   Date of Service  07/06/2023  HPI/Events of Note   L anterior cerebral artery, taken to IR but unable to revascularize, hypertensive. 202/92 185/78, comfortable appearing. Large ACA infarct  BP goal < 180/105 given unsuccessful IR   eICU Interventions  Add labetalol PRN     Intervention Category Intermediate Interventions: Hypertension - evaluation and management  [name] 07/06/2023, 5:32 AM

## 2023-07-07 ENCOUNTER — Inpatient Hospital Stay (HOSPITAL_COMMUNITY): Payer: PPO

## 2023-07-07 DIAGNOSIS — I639 Cerebral infarction, unspecified: Secondary | ICD-10-CM | POA: Diagnosis not present

## 2023-07-07 DIAGNOSIS — I63522 Cerebral infarction due to unspecified occlusion or stenosis of left anterior cerebral artery: Secondary | ICD-10-CM | POA: Diagnosis not present

## 2023-07-07 LAB — BASIC METABOLIC PANEL
Anion gap: 8 (ref 5–15)
BUN: 10 mg/dL (ref 8–23)
CO2: 24 mmol/L (ref 22–32)
Calcium: 8.2 mg/dL — ABNORMAL LOW (ref 8.9–10.3)
Chloride: 109 mmol/L (ref 98–111)
Creatinine, Ser: 0.66 mg/dL (ref 0.44–1.00)
GFR, Estimated: >60 mL/min (ref >60)
Glucose, Bld: 214 mg/dL — ABNORMAL HIGH (ref 70–99)
Potassium: 3.7 mmol/L (ref 3.5–5.1)
Sodium: 141 mmol/L (ref 135–145)

## 2023-07-07 LAB — GLUCOSE, CAPILLARY
Glucose-Capillary: 170 mg/dL — ABNORMAL HIGH (ref 70–99)
Glucose-Capillary: 195 mg/dL — ABNORMAL HIGH (ref 70–99)
Glucose-Capillary: 275 mg/dL — ABNORMAL HIGH (ref 70–99)
Glucose-Capillary: 250 mg/dL — ABNORMAL HIGH (ref 70–99)
Glucose-Capillary: 194 mg/dL — ABNORMAL HIGH (ref 70–99)
Glucose-Capillary: 204 mg/dL — ABNORMAL HIGH (ref 70–99)

## 2023-07-07 LAB — CBC
HCT: 28.7 % — ABNORMAL LOW (ref 36.0–46.0)
Hemoglobin: 9.1 g/dL — ABNORMAL LOW (ref 12.0–15.0)
MCH: 29.4 pg (ref 26.0–34.0)
MCHC: 31.7 g/dL (ref 30.0–36.0)
MCV: 92.9 fL (ref 80.0–100.0)
Platelets: 375 10*3/uL (ref 150–400)
RBC: 3.09 MIL/uL — ABNORMAL LOW (ref 3.87–5.11)
RDW: 13.1 % (ref 11.5–15.5)
WBC: 9.8 10*3/uL (ref 4.0–10.5)
nRBC: 0 % (ref 0.0–0.2)

## 2023-07-07 LAB — PHOSPHORUS
Phosphorus: 3.1 mg/dL (ref 2.5–4.6)
Phosphorus: 2.8 mg/dL (ref 2.5–4.6)

## 2023-07-07 LAB — MAGNESIUM
Magnesium: 2.1 mg/dL (ref 1.7–2.4)
Magnesium: 2 mg/dL (ref 1.7–2.4)

## 2023-07-07 MED ORDER — INSULIN ASPART 100 UNIT/ML IJ SOLN
2.0000 [IU] | INTRAMUSCULAR | Status: DC
  Administered 2023-07-07 – 2023-07-08 (×6): 2 [IU] via SUBCUTANEOUS

## 2023-07-07 MED ORDER — INSULIN GLARGINE-YFGN 100 UNIT/ML ~~LOC~~ SOLN
7.0000 [IU] | Freq: Every day | SUBCUTANEOUS | Status: DC
  Administered 2023-07-07 – 2023-07-08 (×2): 7 [IU] via SUBCUTANEOUS
  Filled 2023-07-07 (×2): qty 0.07

## 2023-07-07 NOTE — Progress Notes (Addendum)
NAME:  Madeline [name], MRN:  [id number], DOB:  1934-04-17, LOS: 3 ADMISSION DATE:  07/03/2023, CONSULTATION DATE: 07/04/2023 REFERRING MD: Dr. Corliss Skains, Reason for consult: Ventilator dependence  History of Present Illness:  89 year old woman with diabetes, COPD, gastroparesis, hyperlipidemia, hypertension.  She has a pacemaker for third-degree heart block, ITP.  She was found on the floor by family on 8/17.  She was conscious but uncertain what had happened, whether she had fallen.  Subsequent head CT was negative for acute abnormality.  She had some sensation asymmetry and dysarthria.  She was admitted for neurological workup.  Then early morning 8/18 significant change in her neurological status.  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  She went for endovascular treatment and interventional radiology but revascularization was not possible due to hard atherosclerotic plaque.  Subsequent head CT did not show any evidence of hemorrhage or complication.  She returned to the ICU intubated and sedated.  Blood pressure goal 140-160.  Currently on propofol, Cleviprex 2  Pertinent  Medical History   Past Medical History:  Diagnosis Date   Bradycardia    Coronary artery disease    Diabetes mellitus    Dyslipidemia    GERD (gastroesophageal reflux disease)    Hypothyroid    Presence of permanent cardiac pacemaker 12/16/2018   [employer]tronic dual lead pacemaker    Significant Hospital Events: Including procedures, antibiotic start and stop dates in addition to other pertinent events     Interim History / Subjective:  Off sedation and cleviprex. Febrile overnight.    Objective   Blood pressure (!) 152/59, pulse 89, temperature (!) 101.1 F (38.4 C), temperature source Axillary, resp. rate (!) 23, height 5\' 2"  (1.575 m), weight 56.5 kg, SpO2 100%.    Vent Mode: CPAP;PSV FiO2 (%):  [40 %] 40 % Set Rate:  [18 bmp] 18 bmp Vt Set:  [400 mL] 400  mL PEEP:  [5 cmH20] 5 cmH20 Pressure Support:  [10 cmH20-12 cmH20] 12 cmH20 Plateau Pressure:  [14 cmH20-15 cmH20] 14 cmH20   Intake/Output Summary (Last 24 hours) at 07/07/2023 0840 Last data filed at 07/07/2023 0700 Gross per 24 hour  Intake 2174.99 ml  Output 800 ml  Net 1374.99 ml   Filed Weights   07/03/23 1450 07/07/23 0500  Weight: 52.2 kg 56.5 kg    Examination: Gen:      Intubated, sedated, elderly, acutely ill appearing HEENT:  ETT to vent Lungs:    sounds of mechanical ventilation auscultated no wheeze CV:         RRR Abd:      + bowel sounds; soft, non-tender; no palpable masses, no distension Ext:    No edema Skin:      Warm and dry; no rashes Neuro:   off sedation, withdraws to pain bilaterally, hemiparesis on the right  CBGs elevated>180  Resolved Hospital Problem list     Assessment & Plan:  89 year old woman with ventilator dependence post procedure and due to impaired airway protection from CVA.  Unsuccessful revascularization in IR 8/18  Acute left anterior A1 CVA.  Unable to revascularize in IR due to plaque -Antiplatelets and anticoagulation as per neurology recommendations -Atorvastatin ordered -Echocardiogram ordered -Seizure precautions - MRI shows left ACA infarct with petechial hemorrhage, chronic small vessel disease. Sub cm acute infarcts bilaterally  Ventilator dependence due to impaired airway protection and following procedure Tobacco use disorder/presumed COPD -PRVC 8 cc/kg -Chest x-ray now -ABG and adjust  minute ventilation, FiO2 appropriately -currently off all sedation - daily assessment for ventilator liberation. Currently mental status barrier to extubation as well as conversation in progress with family regarding long term goals, desire for reintubation or trach.   Hypertension - cortrak placed. Hold home lisinopril - discussed with neurology, permissive hypertension goal SBP<180  Diabetes mellitus Type 1 - continue lantus,  SSI - CBGs elevated, will add TF coverage.   History of ITP -On Promacta as an outpatient, currently on hold  Hypothyroidism - levythyroxine through cortrak  Nutrition - tube feeds    Best Practice (right click and "Reselect all SmartList Selections" daily)   Diet/type: NPO with tube feeds DVT prophylaxis: SCD GI prophylaxis: PPI Lines: N/A Foley:  none Code Status:  full code Last date of multidisciplinary goals of care discussion [updated daughter at bedside 8/21 morning. Patient would likely not have wanted long term care. Tentatively discussed one-way extubation this friday. ]  The patient is critically ill due to respiratory failure, stroke.  Critical care was necessary to treat or prevent imminent or life-threatening deterioration.  Critical care was time spent personally by me on the following activities: development of treatment plan with patient and/or surrogate as well as nursing, discussions with consultants, evaluation of patient's response to treatment, examination of patient, obtaining history from patient or surrogate, ordering and performing treatments and interventions, ordering and review of laboratory studies, ordering and review of radiographic studies, pulse oximetry, re-evaluation of patient's condition and participation in multidisciplinary rounds.   Critical Care Time devoted to patient care services described in this note is 34 minutes. This time reflects time of care of this signee Charlott Holler . This critical care time does not reflect separately billable procedures or procedure time, teaching time or supervisory time of PA/NP/Med student/Med Resident etc but could involve care discussion time.       Charlott Holler [place] [facility] 07/07/2023 8:40 AM  Pager: see AMION  If no response to pager , please call critical care on call (see AMION) until 7pm After 7:00 pm call Elink

## 2023-07-07 NOTE — Progress Notes (Addendum)
STROKE TEAM PROGRESS NOTE   BRIEF HPI [name] is a 89 y.o. female with history of diabetes, COPD, diabetic gastroparesis, hyperlipidemia, hypertension, pacemaker and hypothyroidism admitted yesterday after being found down between her bed and bedside table with mild L leg weakness, confusion, and mild dysarthria.  LKW 1008/17, no TNK or IR due to low NIH/mild symptoms, NIH 3.  SIGNIFICANT HOSPITAL EVENTS 8/17: admitted for stroke vs encephalopathy workup. CT negative. MRI was unable to be done over the weekend due to pacemaker. EEG was negative.  8/18: MD called for neuro change. NIH 20 on neurologists assessment.LKW 1440 day prior. CTA showed Left A1 occlusion, CTP showed slow flow, but no penumbra. She was taken to IR for thrombectomy. Multiple subsequent attempts also unsuccessful  due to the hard plaque  precluding distal access. CT of brain revealed no evidence of  hemorrhage complications  8/19: MRI shows large acute left ACA territory infarct with petechial hemorrhage. Unchanged left A1 occlusion.  LTM EEG negative, discontinued 8/20: Weaning,   INTERIM HISTORY/SUBJECTIVE Family at bedside.  Patient remains hemodynamically stable with unchanged neurological exam.  She was noted to be febrile overnight, and chest x-ray is negative for any acute abnormalities, urinalysis pending.  Goals of care discussion among family members is ongoing, with tentative plan for extubation on Friday.  Palliative care consult placed.  OBJECTIVE  CBC    Component Value Date/Time   WBC 9.8 07/07/2023 0449   RBC 3.09 (L) 07/07/2023 0449   HGB 9.1 (L) 07/07/2023 0449   HGB 11.3 12/09/2018 1308   HCT 28.7 (L) 07/07/2023 0449   HCT 33.1 (L) 12/09/2018 1308   PLT 375 07/07/2023 0449   PLT 52 (LL) 12/09/2018 1308   MCV 92.9 07/07/2023 0449   MCV 92 12/09/2018 1308   MCH 29.4 07/07/2023 0449   MCHC 31.7 07/07/2023 0449   RDW 13.1 07/07/2023 0449   RDW 12.2 12/09/2018 1308   LYMPHSABS 1.1  07/05/2023 0406   LYMPHSABS 2.0 12/09/2018 1308   MONOABS 0.8 07/05/2023 0406   EOSABS 0.0 07/05/2023 0406   EOSABS 0.2 12/09/2018 1308   BASOSABS 0.1 07/05/2023 0406   BASOSABS 0.0 12/09/2018 1308    BMET    Component Value Date/Time   NA 141 07/07/2023 0449   NA 143 12/09/2018 1308   K 3.7 07/07/2023 0449   CL 109 07/07/2023 0449   CO2 24 07/07/2023 0449   GLUCOSE 214 (H) 07/07/2023 0449   BUN 10 07/07/2023 0449   BUN 8 12/09/2018 1308   CREATININE 0.66 07/07/2023 0449   CREATININE 0.73 08/03/2016 1519   CALCIUM 8.2 (L) 07/07/2023 0449   GFRNONAA >60 07/07/2023 0449    IMAGING past 24 hours DG CHEST PORT 1 VIEW  Result Date: 07/07/2023 CLINICAL DATA:  Fever. EXAM: PORTABLE CHEST 1 VIEW COMPARISON:  July 04, 2023. FINDINGS: Stable cardiomediastinal silhouette. Endotracheal and nasogastric tubes are unchanged in position. Left-sided pacemaker is unchanged in position. Lungs are clear. Bony thorax is unremarkable. IMPRESSION: No active disease. Aortic Atherosclerosis (ICD10-I70.0). Electronically Signed   By: Lupita Raider M.D.   On: 07/07/2023 13:29    Vitals:   07/07/23 1150 07/07/23 1200 07/07/23 1300 07/07/23 1400  BP: 91/64 (!) 149/54 (!) 160/55 (!) 148/56  Pulse: 83 84 88 81  Resp: (!) 21 (!) 21 (!) 25 20  Temp:  99.4 F (37.4 C)    TempSrc:  Axillary    SpO2: 100% 100% 100% 100%  Weight:  Height:       PHYSICAL EXAM General:  Alert, well-nourished, well-developed patient in no acute distress Psych:  Mood and affect appropriate for situation CV: Regular rate and rhythm on monitor Respiratory: Respirations synchronous with ventilator  NEURO:  patient is intubated, sedation off Pupils equal round and reactive to light, does not focus or track, cough reflex intact, spontaneous movement of left upper and lower extremities, no movement of right upper and lower extremities   ASSESSMENT/PLAN  Stroke - Acute Large Left ACA territory infarct and punctate  small infarcts left cerebellum and b/l hemishpere with Left A1 Occlusion s/p unsuccessful IR Etiology:  large vessel disease, ddx including cardiembolic source vs. Procedure related embolic shower  Code Stroke CT head 8/18: Stable head CT. No acute finding.  CTA head & neck Abrupt cut of at left A1 segment, acute-appearing.  CT perfusion  No infarct or penumbra by standard perfusion thresholds. But on TTP > 4.0, penumbra shown on left ACA territory MRI /MRA Large acute left ACA territory infarct with petechial hemorrhage.  Subcentimeter acute infarcts in the left cerebellum and left greater than right posterior cerebral hemispheres. Unchanged left A1 occlusion  2D Echo: Severe MAC with moderate MS, EF >75%, Mild LVH, No shunt.  Pacemaker interrogation no afib EEG left frontotemporal focal area of cerebral dysfunction in the setting of more generalized region of cerebral dysfunction.  LDL 69 HgbA1c 8.9 UDS neg VTE prophylaxis - lovenox No antithrombotic prior to admission, now on aspirin 325 mg daily. Consider DAPT once no more procedure planned. Therapy recommendations:  pending Disposition:  pending  Acute Respiratory Failure, post-procedure CCM Management, appreciate their assistance Propofol gtt, Fentanyl IVP PRN Wean as tolerated VAP bundle Not candidate for extubation today Family discussions regarding goals of care are ongoing, tentative plan for one way extubation on Friday  S/p Pacemaker for 3rd Degree heart block Coronary Artery Disease / STEMI s/p stenting 2012 Hypertension Home meds:  lisinopril 20mg , on hold Pacemaker placed 2020, Med[employer]ronic dual-lead No afib found  BP goal < 180/105 given unsuccessful IR Long term BP goal normotensive  Hyperlipidemia Home meds:  lipitor 20mg  LDL 69, goal < 70 Now on lipitor 20 Continue statin at discharge  Diabetes type II  Home meds:  lantus, humalog HgbA1c 8.9, goal < 7.0 CBGs SSI On insulin  Recommend close follow-up  with PCP for better DM control  Tobacco Abuse Patient smokes 1 pack per day for 50 years Nicotine replacement therapy provided Cessation education will be provided  Other stroke risk factors Advance age  Other acute issues Hx of ITP on Promacta, follows with oncology, platelet 422->417 Leukocytosis WBC 7.2->11.7   Hospital day # 3   Patient seen and examined by NP/APP with MD. MD to update note as needed.   Cortney E Ernestina Columbia , MSN, AGACNP-BC [facility] See Amion for schedule and pager information 07/07/2023 2:50 PM  ATTENDING NOTE: I reviewed above note and agree with the assessment and plan. Pt was seen and examined.   Daughter at the bedside. Pt still intubated, not open eyes on voice, not following commands, briefly open eyes with painful stimuli. Slightly blinking to visual threat on the left but no blinking on the right. RUE flaccid, RLE mild withdraw to pain. Still has spontaneous movement at LUE and LLE. Pt with likely poor neuro prognosis, encourage family GOC discussion. Will involve palliative care for further GOC discussion. Tentative Friday one-way extubation but waiting family decision.   For detailed assessment  and plan, please refer to above/below as I have made changes wherever appropriate.   Marvel Plan, MD PhD Stroke Neurology 07/07/2023 9:45 PM  This patient is critically ill due to large left ACA stroke with left A1 occlusion, unsuccessful IR, respiratory failure and at significant risk of neurological worsening, death form recurrent stroke, hemorrhagic transformation, respiratory failure, seizure. This patient's care requires constant monitoring of vital signs, hemodynamics, respiratory and cardiac monitoring, review of multiple databases, neurological assessment, discussion with family, other specialists and medical decision making of high complexity. I spent 35 minutes of neurocritical care time in the care of this patient. I had long  discussion with daughter at bedside, updated pt current condition, treatment plan and potential prognosis, and answered all the questions. She expressed understanding and appreciation. I discussed with CCM Dr. Celine Mans.

## 2023-07-07 NOTE — Plan of Care (Signed)
  Problem: Coping: Goal: Ability to adjust to condition or change in health will improve Outcome: Progressing   Problem: Fluid Volume: Goal: Ability to maintain a balanced intake and output will improve Outcome: Progressing   Problem: Metabolic: Goal: Ability to maintain appropriate glucose levels will improve Outcome: Progressing   Problem: Nutritional: Goal: Maintenance of adequate nutrition will improve Outcome: Progressing Goal: Progress toward achieving an optimal weight will improve Outcome: Progressing   Problem: Skin Integrity: Goal: Risk for impaired skin integrity will decrease Outcome: Progressing   Problem: Tissue Perfusion: Goal: Adequacy of tissue perfusion will improve Outcome: Progressing   Problem: Education: Goal: Knowledge of disease or condition will improve Outcome: Progressing Goal: Knowledge of secondary prevention will improve (MUST DOCUMENT ALL) Outcome: Progressing Goal: Knowledge of patient specific risk factors will improve Loraine Leriche N/A or DELETE if not current risk factor) Outcome: Progressing   Problem: Health Behavior/Discharge Planning: Goal: Ability to manage health-related needs will improve Outcome: Progressing Goal: Goals will be collaboratively established with patient/family Outcome: Progressing

## 2023-07-08 DIAGNOSIS — I639 Cerebral infarction, unspecified: Secondary | ICD-10-CM | POA: Diagnosis not present

## 2023-07-08 LAB — CBC
HCT: 26.9 % — ABNORMAL LOW (ref 36.0–46.0)
Hemoglobin: 8.6 g/dL — ABNORMAL LOW (ref 12.0–15.0)
MCH: 29.5 pg (ref 26.0–34.0)
MCHC: 32 g/dL (ref 30.0–36.0)
MCV: 92.1 fL (ref 80.0–100.0)
Platelets: 395 10*3/uL (ref 150–400)
RBC: 2.92 MIL/uL — ABNORMAL LOW (ref 3.87–5.11)
RDW: 12.9 % (ref 11.5–15.5)
WBC: 7.8 10*3/uL (ref 4.0–10.5)
nRBC: 0 % (ref 0.0–0.2)

## 2023-07-08 LAB — URINALYSIS, ROUTINE W REFLEX MICROSCOPIC
Bilirubin Urine: NEGATIVE
Glucose, UA: >=500 mg/dL — AB
Hgb urine dipstick: NEGATIVE
Ketones, ur: 20 mg/dL — AB
Leukocytes,Ua: NEGATIVE
Nitrite: NEGATIVE
Protein, ur: NEGATIVE mg/dL
Specific Gravity, Urine: 1.02 (ref 1.005–1.030)
pH: 5 (ref 5.0–8.0)

## 2023-07-08 LAB — BASIC METABOLIC PANEL WITH GFR
Anion gap: 10 (ref 5–15)
BUN: 11 mg/dL (ref 8–23)
CO2: 26 mmol/L (ref 22–32)
Calcium: 8.2 mg/dL — ABNORMAL LOW (ref 8.9–10.3)
Chloride: 108 mmol/L (ref 98–111)
Creatinine, Ser: 0.62 mg/dL (ref 0.44–1.00)
GFR, Estimated: >60 mL/min
Glucose, Bld: 280 mg/dL — ABNORMAL HIGH (ref 70–99)
Potassium: 3.3 mmol/L — ABNORMAL LOW (ref 3.5–5.1)
Sodium: 144 mmol/L (ref 135–145)

## 2023-07-08 LAB — TRIGLYCERIDES: Triglycerides: 60 mg/dL

## 2023-07-08 LAB — GLUCOSE, CAPILLARY
Glucose-Capillary: 249 mg/dL — ABNORMAL HIGH (ref 70–99)
Glucose-Capillary: 236 mg/dL — ABNORMAL HIGH (ref 70–99)
Glucose-Capillary: 313 mg/dL — ABNORMAL HIGH (ref 70–99)
Glucose-Capillary: 174 mg/dL — ABNORMAL HIGH (ref 70–99)
Glucose-Capillary: 201 mg/dL — ABNORMAL HIGH (ref 70–99)

## 2023-07-08 LAB — MAGNESIUM: Magnesium: 2 mg/dL (ref 1.7–2.4)

## 2023-07-08 LAB — PHOSPHORUS: Phosphorus: 3 mg/dL (ref 2.5–4.6)

## 2023-07-08 MED ORDER — INSULIN GLARGINE-YFGN 100 UNIT/ML ~~LOC~~ SOLN
10.0000 [IU] | Freq: Every day | SUBCUTANEOUS | Status: DC
  Administered 2023-07-09 – 2023-07-11 (×3): 10 [IU] via SUBCUTANEOUS
  Filled 2023-07-08 (×3): qty 0.1

## 2023-07-08 MED ORDER — INSULIN ASPART 100 UNIT/ML IJ SOLN
3.0000 [IU] | INTRAMUSCULAR | Status: DC
  Administered 2023-07-08 – 2023-07-11 (×11): 3 [IU] via SUBCUTANEOUS

## 2023-07-08 NOTE — Progress Notes (Signed)
NAME:  Madeline [name], MRN:  [id number], DOB:  [birth year], LOS: 4 ADMISSION DATE:  07/03/2023, CONSULTATION DATE: 07/04/2023 REFERRING MD: Dr. Corliss Skains, Reason for consult: Ventilator dependence  History of Present Illness:  89 year old woman with diabetes, COPD, gastroparesis, hyperlipidemia, hypertension.  She has a pacemaker for third-degree heart block, ITP.  She was found on the floor by family on 8/17.  She was conscious but uncertain what had happened, whether she had fallen.  Subsequent head CT was negative for acute abnormality.  She had some sensation asymmetry and dysarthria.  She was admitted for neurological workup.  Then early morning 8/18 significant change in her neurological status.  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  She went for endovascular treatment and interventional radiology but revascularization was not possible due to hard atherosclerotic plaque.  Subsequent head CT did not show any evidence of hemorrhage or complication.  She returned to the ICU intubated and sedated.  Blood pressure goal 140-160.  Currently on propofol, Cleviprex 2  Pertinent  Medical History   Past Medical History:  Diagnosis Date   Bradycardia    Coronary artery disease    Diabetes mellitus    Dyslipidemia    GERD (gastroesophageal reflux disease)    Hypothyroid    Presence of permanent cardiac pacemaker 12/16/2018   Medtronic dual lead pacemaker    Significant Hospital Events: Including procedures, antibiotic start and stop dates in addition to other pertinent events     Interim History / Subjective:  CBGs still elevated over 200.    Objective   Blood pressure (!) 166/56, pulse 83, temperature 98.6 F (37 C), temperature source Axillary, resp. rate 20, height 5\' 2"  (1.575 m), weight 56.8 kg, SpO2 100%.    Vent Mode: CPAP;PSV FiO2 (%):  [40 %] 40 % Set Rate:  [18 bmp] 18 bmp Vt Set:  [400 mL] 400 mL PEEP:  [5 cmH20] 5  cmH20 Pressure Support:  [12 cmH20] 12 cmH20 Plateau Pressure:  [10 cmH20-16 cmH20] 16 cmH20   Intake/Output Summary (Last 24 hours) at 07/08/2023 0741 Last data filed at 07/08/2023 0700 Gross per 24 hour  Intake 2995.55 ml  Output 1300 ml  Net 1695.55 ml   Filed Weights   07/03/23 1450 07/07/23 0500 07/08/23 0500  Weight: 52.2 kg 56.5 kg 56.8 kg    Examination: Gen:      Intubated, sedated, acutely ill appearing HEENT:  ETT to vent Lungs:    sounds of mechanical ventilation auscultated no wheeze CV:         RRR Abd:      + bowel sounds; soft, non-tender; no palpable masses, no distension Ext:    No edema Skin:      Warm and dry; no rashes Neuro:   sedated, RASS -2, withdraws to pain bilaterally, opens eyes to verbal stimuli, does not consistently follow commands  CBGs elevated>180  Resolved Hospital Problem list     Assessment & Plan:  89 year old woman with ventilator dependence post procedure and due to impaired airway protection from CVA.  Unsuccessful revascularization in IR 8/18  Acute left anterior A1 CVA.  Unable to revascularize in IR due to plaque -Antiplatelets and anticoagulation as per neurology recommendations -Atorvastatin ordered -Echocardiogram showing mitral valve stenosis/calcification, no intracardiac shunting. -Seizure precautions - MRI shows left ACA infarct with petechial hemorrhage, chronic small vessel disease. Sub cm acute infarcts bilaterally  Ventilator dependence due to impaired airway protection and following procedure Tobacco  use disorder/presumed COPD -PRVC 8 cc/kg -Chest x-ray now -ABG and adjust minute ventilation, FiO2 appropriately -currently off all sedation - daily assessment for ventilator liberation. Currently mental status barrier to extubation as well as conversation in progress with family regarding long term goals, desire for reintubation or trach. Appreciate palliative care involvement.   Hypertension - cortrak placed. Hold  home lisinopril - discussed with neurology, permissive hypertension goal SBP<180  Diabetes mellitus Type 1 - continue lantus, SSI - CBGs elevated, will increase insulin coverage  History of ITP -On Promacta as an outpatient, currently on hold  Hypothyroidism - levythyroxine through cortrak  Nutrition - tube feeds    Best Practice (right click and "Reselect all SmartList Selections" daily)   Diet/type: NPO with tube feeds DVT prophylaxis: SCD GI prophylaxis: PPI Lines: N/A Foley:  none Code Status:  full code Last date of multidisciplinary goals of care discussion [updated daughter at bedside 8/22 morning.]   The patient is critically ill due to stroke, respiratory failure.  Critical care was necessary to treat or prevent imminent or life-threatening deterioration.  Critical care was time spent personally by me on the following activities: development of treatment plan with patient and/or surrogate as well as nursing, discussions with consultants, evaluation of patient's response to treatment, examination of patient, obtaining history from patient or surrogate, ordering and performing treatments and interventions, ordering and review of laboratory studies, ordering and review of radiographic studies, pulse oximetry, re-evaluation of patient's condition and participation in multidisciplinary rounds.   Critical Care Time devoted to patient care services described in this note is 35 minutes. This time reflects time of care of this signee Charlott Holler . This critical care time does not reflect separately billable procedures or procedure time, teaching time or supervisory time of PA/NP/Med student/Med Resident etc but could involve care discussion time.       Mickel Baas Pulmonary and Critical Care Medicine 07/08/2023 10:08 AM  Pager: see AMION  If no response to pager , please call critical care on call (see AMION) until 7pm After 7:00 pm call Elink

## 2023-07-08 NOTE — Inpatient Diabetes Management (Signed)
Inpatient Diabetes Program Recommendations  AACE/ADA: New Consensus Statement on Inpatient Glycemic Control  Target Ranges:  Prepandial:   less than 140 mg/dL      Peak postprandial:   less than 180 mg/dL (1-2 hours)      Critically ill patients:  140 - 180 mg/dL    Latest Reference Range & Units 07/07/23 08:05 07/07/23 11:50 07/07/23 15:57 07/07/23 19:14 07/07/23 23:11 07/08/23 03:19 07/08/23 08:10 07/08/23 11:09  Glucose-Capillary 70 - 99 mg/dL 301 (H) 601 (H) 093 (H) 194 (H) 204 (H) 249 (H) 236 (H) 313 (H)   Review of Glycemic Control  Current orders for Inpatient glycemic control: Semglee 10 units daily, Novolog 3 units Q4H, Novolog 0-15 units Q4H; Vital @ 50 ml/hr  Inpatient Diabetes Program Recommendations:    Insulin: Please consider increasing Semglee further to 13 units daily and tube feeding coverage to Novolog 5 units Q4H.  Thanks, Orlando Penner, RN, MSN, CDCES Diabetes Coordinator Inpatient Diabetes Program [phone] (Team Pager from 8am to 5pm)

## 2023-07-08 NOTE — Plan of Care (Signed)
Problem: Education: Goal: Ability to describe self-care measures that may prevent or decrease complications (Diabetes Survival Skills Education) will improve Outcome: Not Progressing   Problem: Coping: Goal: Ability to adjust to condition or change in health will improve Outcome: Not Progressing   Problem: Fluid Volume: Goal: Ability to maintain a balanced intake and output will improve Outcome: Progressing   Problem: Health Behavior/Discharge Planning: Goal: Ability to identify and utilize available resources and services will improve Outcome: Not Progressing Goal: Ability to manage health-related needs will improve Outcome: Not Progressing   Problem: Metabolic: Goal: Ability to maintain appropriate glucose levels will improve Outcome: Progressing   Problem: Nutritional: Goal: Maintenance of adequate nutrition will improve Outcome: Progressing Goal: Progress toward achieving an optimal weight will improve Outcome: Progressing   Problem: Skin Integrity: Goal: Risk for impaired skin integrity will decrease Outcome: Progressing   Problem: Tissue Perfusion: Goal: Adequacy of tissue perfusion will improve Outcome: Progressing   Problem: Education: Goal: Knowledge of disease or condition will improve Outcome: Not Progressing Goal: Knowledge of secondary prevention will improve (MUST DOCUMENT ALL) Outcome: Not Progressing Goal: Knowledge of patient specific risk factors will improve Loraine Leriche N/A or DELETE if not current risk factor) Outcome: Not Progressing   Problem: Ischemic Stroke/TIA Tissue Perfusion: Goal: Complications of ischemic stroke/TIA will be minimized Outcome: Progressing   Problem: Coping: Goal: Will verbalize positive feelings about self Outcome: Not Progressing Goal: Will identify appropriate support needs Outcome: Not Progressing   Problem: Health Behavior/Discharge Planning: Goal: Ability to manage health-related needs will improve Outcome: Not  Progressing Goal: Goals will be collaboratively established with patient/family Outcome: Progressing   Problem: Self-Care: Goal: Ability to participate in self-care as condition permits will improve Outcome: Not Progressing Goal: Verbalization of feelings and concerns over difficulty with self-care will improve Outcome: Not Progressing Goal: Ability to communicate needs accurately will improve Outcome: Not Progressing   Problem: Nutrition: Goal: Risk of aspiration will decrease Outcome: Progressing Goal: Dietary intake will improve Outcome: Progressing   Problem: Education: Goal: Understanding of CV disease, CV risk reduction, and recovery process will improve Outcome: Not Progressing Goal: Individualized Educational Video(s) Outcome: Not Applicable   Problem: Activity: Goal: Ability to return to baseline activity level will improve Outcome: Not Progressing   Problem: Cardiovascular: Goal: Ability to achieve and maintain adequate cardiovascular perfusion will improve Outcome: Progressing Goal: Vascular access site(s) Level 0-1 will be maintained Outcome: Progressing   Problem: Health Behavior/Discharge Planning: Goal: Ability to safely manage health-related needs after discharge will improve Outcome: Not Progressing   Problem: Activity: Goal: Ability to tolerate increased activity will improve Outcome: Progressing   Problem: Respiratory: Goal: Ability to maintain a clear airway and adequate ventilation will improve Outcome: Progressing   Problem: Role Relationship: Goal: Method of communication will improve Outcome: Not Progressing   Problem: Safety: Goal: Non-violent Restraint(s) Outcome: Progressing   Problem: Education: Goal: Knowledge of General Education information will improve Description: Including pain rating scale, medication(s)/side effects and non-pharmacologic comfort measures Outcome: Not Progressing   Problem: Health Behavior/Discharge  Planning: Goal: Ability to manage health-related needs will improve Outcome: Not Progressing   Problem: Clinical Measurements: Goal: Ability to maintain clinical measurements within normal limits will improve Outcome: Progressing Goal: Will remain free from infection Outcome: Progressing Goal: Diagnostic test results will improve Outcome: Progressing Goal: Respiratory complications will improve Outcome: Progressing Goal: Cardiovascular complication will be avoided Outcome: Progressing   Problem: Activity: Goal: Risk for activity intolerance will decrease Outcome: Progressing   Problem: Nutrition: Goal: Adequate nutrition  will be maintained Outcome: Progressing   Problem: Coping: Goal: Level of anxiety will decrease Outcome: Progressing   Problem: Elimination: Goal: Will not experience complications related to bowel motility Outcome: Progressing Goal: Will not experience complications related to urinary retention Outcome: Progressing   Problem: Pain Managment: Goal: General experience of comfort will improve Outcome: Progressing

## 2023-07-08 NOTE — Progress Notes (Addendum)
STROKE TEAM PROGRESS NOTE   BRIEF HPI [name] is a 89 y.o. female with history of diabetes, COPD, diabetic gastroparesis, hyperlipidemia, hypertension, pacemaker and hypothyroidism admitted yesterday after being found down between her bed and bedside table with mild L leg weakness, confusion, and mild dysarthria.  LKW 1008/17, no TNK or IR due to low NIH/mild symptoms, NIH 3.  SIGNIFICANT HOSPITAL EVENTS 8/17: admitted for stroke vs encephalopathy workup. CT negative. MRI was unable to be done over the weekend due to pacemaker. EEG was negative.  8/18: MD called for neuro change. NIH 20 on neurologists assessment.LKW 1440 day prior. CTA showed Left A1 occlusion, CTP showed slow flow, but no penumbra. She was taken to IR for thrombectomy. Multiple subsequent attempts also unsuccessful  due to the hard plaque  precluding distal access. CT of brain revealed no evidence of  hemorrhage complications  8/19: MRI shows large acute left ACA territory infarct with petechial hemorrhage. Unchanged left A1 occlusion.  LTM EEG negative, discontinued 8/20: Weaning,   INTERIM HISTORY/SUBJECTIVE Patient is seen in her room with multiple family members at bedside.  She has been hemodynamically stable, and neurological exam is unchanged.  Family has discussed goals of care, and plan is for one-way extubation tomorrow when all family members can be present, likely around 6 or 7 PM.  OBJECTIVE  CBC    Component Value Date/Time   WBC 7.8 07/08/2023 1323   RBC 2.92 (L) 07/08/2023 1323   HGB 8.6 (L) 07/08/2023 1323   HGB 11.3 12/09/2018 1308   HCT 26.9 (L) 07/08/2023 1323   HCT 33.1 (L) 12/09/2018 1308   PLT 395 07/08/2023 1323   PLT 52 (LL) 12/09/2018 1308   MCV 92.1 07/08/2023 1323   MCV 92 12/09/2018 1308   MCH 29.5 07/08/2023 1323   MCHC 32.0 07/08/2023 1323   RDW 12.9 07/08/2023 1323   RDW 12.2 12/09/2018 1308   LYMPHSABS 1.1 07/05/2023 0406   LYMPHSABS 2.0 12/09/2018 1308   MONOABS 0.8  07/05/2023 0406   EOSABS 0.0 07/05/2023 0406   EOSABS 0.2 12/09/2018 1308   BASOSABS 0.1 07/05/2023 0406   BASOSABS 0.0 12/09/2018 1308    BMET    Component Value Date/Time   NA 144 07/08/2023 1323   NA 143 12/09/2018 1308   K 3.3 (L) 07/08/2023 1323   CL 108 07/08/2023 1323   CO2 26 07/08/2023 1323   GLUCOSE 280 (H) 07/08/2023 1323   BUN 11 07/08/2023 1323   BUN 8 12/09/2018 1308   CREATININE 0.62 07/08/2023 1323   CREATININE 0.73 08/03/2016 1519   CALCIUM 8.2 (L) 07/08/2023 1323   GFRNONAA >60 07/08/2023 1323    IMAGING past 24 hours No results found.  Vitals:   07/08/23 1145 07/08/23 1200 07/08/23 1300 07/08/23 1400  BP: (!) 162/61 (!) 160/58 (!) 155/59 (!) 160/52  Pulse: 74 88 85 82  Resp: 18 (!) 23 (!) 24 (!) 28  Temp:  (!) 100.6 F (38.1 C) 99.6 F (37.6 C)   TempSrc:  Axillary Oral   SpO2: 100% 100% 100% 100%  Weight:      Height:       PHYSICAL EXAM General:  Alert, well-nourished, well-developed patient in no acute distress Psych:  Mood and affect appropriate for situation CV: Regular rate and rhythm on monitor Respiratory: Respirations synchronous with ventilator  NEURO:  patient is intubated, sedation off Pupils equal round and reactive to light, opens eyes to noxious stimuli, does not focus or track, cough  reflex intact, spontaneous movement of left upper and lower extremities, no movement of right upper and lower extremities   ASSESSMENT/PLAN  Stroke - Acute Large Left ACA territory infarct and punctate small infarcts left cerebellum and b/l hemishpere with Left A1 Occlusion s/p unsuccessful IR Etiology:  large vessel disease, ddx including cardiembolic source vs. Procedure related embolic shower  Code Stroke CT head 8/18: Stable head CT. No acute finding.  CTA head & neck Abrupt cut of at left A1 segment, acute-appearing.  CT perfusion  No infarct or penumbra by standard perfusion thresholds. But on TTP > 4.0, penumbra shown on left ACA  territory MRI /MRA Large acute left ACA territory infarct with petechial hemorrhage.  Subcentimeter acute infarcts in the left cerebellum and left greater than right posterior cerebral hemispheres. Unchanged left A1 occlusion  2D Echo: Severe MAC with moderate MS, EF >75%, Mild LVH, No shunt.  Pacemaker interrogation no afib EEG left frontotemporal focal area of cerebral dysfunction in the setting of more generalized region of cerebral dysfunction.  LDL 69 HgbA1c 8.9 UDS neg VTE prophylaxis - lovenox No antithrombotic prior to admission, now on aspirin 325 mg daily. Consider DAPT once no more procedure planned. Therapy recommendations:  pending Disposition:  pending  Acute Respiratory Failure, post-procedure CCM Management, appreciate their assistance Propofol gtt, Fentanyl IVP PRN Wean as tolerated VAP bundle Not candidate for extubation today Family discussions regarding goals of care are ongoing, plan for one way extubation on Friday when family can be present  S/p Pacemaker for 3rd Degree heart block Coronary Artery Disease / STEMI s/p stenting 2012 Hypertension Home meds:  lisinopril 20mg , on hold Pacemaker placed 2020, MedTronic dual-lead No afib found  BP goal < 180/105 given unsuccessful IR Long term BP goal normotensive  Hyperlipidemia Home meds:  lipitor 20mg  LDL 69, goal < 70 Now on lipitor 20 Continue statin at discharge  Diabetes type II, uncontrolled Home meds:  lantus, humalog HgbA1c 8.9, goal < 7.0 CBGs SSI On insulin  Recommend close follow-up with PCP for better DM control  Tobacco Abuse Patient smokes 1 pack per day for 50 years Nicotine replacement therapy provided Cessation education will be provided  Other stroke risk factors Advance age  Other acute issues Hx of ITP on Promacta, follows with oncology, platelet 422->417->395 Leukocytosis WBC 7.2->11.7 Fever Tmax 101.6, UA WBC 6-10, CXR neg, no leukocytosis Anemia, Hb  10.7->9.1->8.6   Hospital day # 4   Patient seen and examined by NP/APP with MD. MD to update note as needed.   Cortney E Ernestina Madeline [name] , MSN, AGACNP-BC [facility] See Amion for schedule and pager information 07/08/2023 2:34 PM  ATTENDING NOTE: I reviewed above note and agree with the assessment and plan. Pt was seen and examined.   Two daughters and pastor are at the bedside. Pt lying in bed, still intubated, not open eyes on voice, slightly open eyes on painful stimuli. Not following commands. Spontaneous movement of LUE and LLE, flaccid RUE and mild withdraw to pain on RLE. Again discussed about poor prognosis to daughters based on the advanced age and significant neuro deficit. Family leaning towards one way extubation tomorrow evening when family present. Continue current management, appreciate CCM assistance.    For detailed assessment and plan, please refer to above/below as I have made changes wherever appropriate.   Marvel Plan, MD PhD Stroke Neurology 07/08/2023 9:24 PM  This patient is critically ill due to large left ACA stroke with left A1 occlusion, unsuccessful IR,  respiratory failure and at significant risk of neurological worsening, death form recurrent stroke, hemorrhagic transformation, respiratory failure, seizure. This patient's care requires constant monitoring of vital signs, hemodynamics, respiratory and cardiac monitoring, review of multiple databases, neurological assessment, discussion with family, other specialists and medical decision making of high complexity. I spent 35 minutes of neurocritical care time in the care of this patient. I had long discussion with daughters at bedside, updated pt current condition, treatment plan and potential prognosis, and answered all the questions. They expressed understanding and appreciation. I discussed with CCM Dr. Celine Mans.

## 2023-07-09 DIAGNOSIS — Z7189 Other specified counseling: Secondary | ICD-10-CM

## 2023-07-09 DIAGNOSIS — I639 Cerebral infarction, unspecified: Secondary | ICD-10-CM | POA: Diagnosis not present

## 2023-07-09 DIAGNOSIS — R400 Somnolence: Secondary | ICD-10-CM

## 2023-07-09 DIAGNOSIS — I63512 Cerebral infarction due to unspecified occlusion or stenosis of left middle cerebral artery: Secondary | ICD-10-CM

## 2023-07-09 DIAGNOSIS — Z515 Encounter for palliative care: Secondary | ICD-10-CM | POA: Diagnosis not present

## 2023-07-09 DIAGNOSIS — J9600 Acute respiratory failure, unspecified whether with hypoxia or hypercapnia: Secondary | ICD-10-CM | POA: Diagnosis not present

## 2023-07-09 LAB — GLUCOSE, CAPILLARY
Glucose-Capillary: 119 mg/dL — ABNORMAL HIGH (ref 70–99)
Glucose-Capillary: 157 mg/dL — ABNORMAL HIGH (ref 70–99)
Glucose-Capillary: 196 mg/dL — ABNORMAL HIGH (ref 70–99)
Glucose-Capillary: 201 mg/dL — ABNORMAL HIGH (ref 70–99)
Glucose-Capillary: 210 mg/dL — ABNORMAL HIGH (ref 70–99)
Glucose-Capillary: 267 mg/dL — ABNORMAL HIGH (ref 70–99)
Glucose-Capillary: 307 mg/dL — ABNORMAL HIGH (ref 70–99)

## 2023-07-09 LAB — CBC
HCT: 28.1 % — ABNORMAL LOW (ref 36.0–46.0)
Hemoglobin: 8.8 g/dL — ABNORMAL LOW (ref 12.0–15.0)
MCH: 29.3 pg (ref 26.0–34.0)
MCHC: 31.3 g/dL (ref 30.0–36.0)
MCV: 93.7 fL (ref 80.0–100.0)
Platelets: 444 10*3/uL — ABNORMAL HIGH (ref 150–400)
RBC: 3 MIL/uL — ABNORMAL LOW (ref 3.87–5.11)
RDW: 13 % (ref 11.5–15.5)
WBC: 7.7 10*3/uL (ref 4.0–10.5)
nRBC: 0 % (ref 0.0–0.2)

## 2023-07-09 LAB — BASIC METABOLIC PANEL
Anion gap: 10 (ref 5–15)
BUN: 15 mg/dL (ref 8–23)
CO2: 27 mmol/L (ref 22–32)
Calcium: 8.1 mg/dL — ABNORMAL LOW (ref 8.9–10.3)
Chloride: 103 mmol/L (ref 98–111)
Creatinine, Ser: 0.62 mg/dL (ref 0.44–1.00)
GFR, Estimated: >60 mL/min (ref >60)
Glucose, Bld: 227 mg/dL — ABNORMAL HIGH (ref 70–99)
Potassium: 3.1 mmol/L — ABNORMAL LOW (ref 3.5–5.1)
Sodium: 140 mmol/L (ref 135–145)

## 2023-07-09 MED ORDER — DIPHENHYDRAMINE HCL 50 MG/ML IJ SOLN: 25.0000 mg | Freq: Four times a day (QID) | INTRAMUSCULAR | Status: DC | PRN

## 2023-07-09 MED ORDER — LORAZEPAM 2 MG/ML IJ SOLN
1.0000 mg | INTRAMUSCULAR | Status: DC | PRN
  Filled 2023-07-09: qty 1

## 2023-07-09 MED ORDER — POTASSIUM CHLORIDE 20 MEQ PO PACK
40.0000 meq | PACK | Freq: Once | ORAL | Status: AC
  Administered 2023-07-09: 40 meq
  Filled 2023-07-09: qty 2

## 2023-07-09 MED ORDER — MORPHINE SULFATE (PF) 2 MG/ML IV SOLN
2.0000 mg | INTRAVENOUS | Status: DC | PRN
  Administered 2023-07-09 – 2023-07-10 (×2): 2 mg via INTRAVENOUS
  Filled 2023-07-09 (×2): qty 1

## 2023-07-09 MED ORDER — SCOPOLAMINE 1 MG/3DAYS TD PT72
1.0000 | MEDICATED_PATCH | TRANSDERMAL | Status: DC
  Administered 2023-07-09 – 2023-07-12 (×2): 1.5 mg via TRANSDERMAL
  Filled 2023-07-09 (×2): qty 1

## 2023-07-09 NOTE — Procedures (Signed)
Extubation Procedure Note  Patient Details:   Name: Madeline [name] DOB: Jan 27, 1934 MRN: [id number]   Airway Documentation:    Vent end date: 07/09/23 Vent end time: 1756   Evaluation  O2 sats: stable throughout Complications: No apparent complications Patient did tolerate procedure well. Bilateral Breath Sounds: Clear, Diminished   Pt extubated to 3L [place].  [name] 07/09/2023, 5:56 PM

## 2023-07-09 NOTE — Progress Notes (Signed)
On 2nd attempt for IV access, noted blood return with pulsing. Removed catheter and applied pressure dressing. RN notified.

## 2023-07-09 NOTE — Consult Note (Signed)
Palliative Care Consult Note                                  Date: 07/09/2023   Patient Name: Madeline [name]  DOB: Sep 25, 1934  MRN: [id number]  Age / Sex: 89 y.o., female  PCP: Marguarite Arbour, MD Referring Physician: Stroke, Md, MD  Reason for Consultation: Establishing goals of care   HPI/Patient Profile: 89 y.o. female  with past medical history of history of diabetes, COPD, diabetic gastroparesis, hyperlipidemia, hypertension, pacemaker and hypothyroidism admitted on 07/03/2023 for neurological work up after being found down between her bed and bedside table with mild L leg weakness, confusion, and mild dysarthria.    On 07/04/2023 she had a significant change in her neurological status. 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 She went for endovascular treatment and interventional radiology but revascularization was not possible due to hard atherosclerotic plaque.   Past Medical History:  Diagnosis Date   Bradycardia    Coronary artery disease    Diabetes mellitus    Dyslipidemia    GERD (gastroesophageal reflux disease)    Hypothyroid    Presence of permanent cardiac pacemaker 12/16/2018   Medtronic dual lead pacemaker    Subjective:   I have reviewed medical records including EPIC notes, labs and imaging, assessed the patient and then met in the patient's room with the patient's daughters Juliette Alcide and [name] to provide support, discuss diagnosis prognosis, GOC, EOL wishes, disposition and options.  I introduced Palliative Medicine as specialized medical care for people living with serious illness. It focuses on providing relief from symptoms and stress of a serious illness. The goal is to improve quality of life for both the patient and the family.  Created space and opportunity for patient  and family to explore thoughts and feelings regarding current medical situation. Values  and goals of care important to patient and family were attempted to be elicited.    Patient/Family Understanding of Illness: The patient's daughters Elita Quick and Juliette Alcide have a good understanding of their mother's current medical condition and plan of care.  Social History: The patient has been married to her spouse for 70 years. Her husband has Alzheimer's disease. They have 4 grown children that support them by staying with them 24/7. 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 She has always been very independent and loved gardening.  Functional and Nutritional State: The patient was mostly independent before this hospitalization. Her daughter would help her with some IADLs like her medications. The patient was able to take care of her ADLs independently.  Today's Discussion: The patient's daughters state the patient was very open about her end of life wishes and she would not want to be "living on machines." They also noted she would not want a feeding tube if she were unable to be awake and understand what was happening.  We discussed the plan for one way extubation this evening. The patient's children are bringing their father to see the patient once she is extubated and are a little concerned  about his reaction since he has Alzheimer's. We discussed the process of extubation and what the family could expect. We discussed that the patient would be given medications to ensure she is comfortable. They will have their preacher at bedside. They plan to have a family member stay with their mother at all times after the extubation. Answered questions about what might happen if she does or does not breath on her own. Emotional support provided.  Discussed the importance of continued conversation with family and the medical providers regarding overall plan of care and treatment options, ensuring decisions are within the context of the patient's values and  GOCs.  Questions and concerns were addressed. Hard Choices booklet left for review. The family was encouraged to call with questions or concerns. PMT will continue to support holistically.  Review of Systems  Unable to perform ROS   Objective:   Primary Diagnoses: Present on Admission:  Altered mental state  Anterior cerebral circulation infarction, left [facility])   Physical Exam Constitutional:      Appearance: She is ill-appearing.     Interventions: She is intubated.  HENT:     Mouth/Throat:     Comments: ETT to vent Pulmonary:     Effort: She is intubated.  Skin:    General: Skin is warm and dry.  Neurological:     Mental Status: She is lethargic.     Vital Signs:  BP (!) 122/42   Pulse 73   Temp 99.8 F (37.7 C) (Axillary)   Resp 18   Ht 5\' 2"  (1.575 m)   Wt 58.4 kg   SpO2 100%   BMI 23.55 kg/m   Palliative Assessment/Data: 10%     Assessment & Plan:   SUMMARY OF RECOMMENDATIONS   DNR One way extubation tonight PMT support   Discussed with: Kyung Rudd NP and bedside RN    Thank you for allowing [place] to participate in the care of Lajean Picciano Walsh PMT will continue to support holistically.  Time Total: 90 minutes   Signed by: Sarina Ser, NP Palliative Medicine Team  Team Phone # [phone] (Nights/Weekends)  07/09/2023, 1:21 PM

## 2023-07-09 NOTE — Progress Notes (Signed)
Patients IV leaking, removed. IV team notified of the need for a new IV.

## 2023-07-09 NOTE — Progress Notes (Signed)
Pt has no IV access. Will re-call IV team, attempt myself, and notify providers.

## 2023-07-09 NOTE — Progress Notes (Addendum)
STROKE TEAM PROGRESS NOTE   BRIEF HPI [name] is a 89 y.o. female with history of diabetes, COPD, diabetic gastroparesis, hyperlipidemia, hypertension, pacemaker and hypothyroidism admitted yesterday after being found down between her bed and bedside table with mild L leg weakness, confusion, and mild dysarthria.  LKW 1008/17.  SIGNIFICANT HOSPITAL EVENTS 8/17: admitted for stroke vs encephalopathy workup. CT negative. MRI was unable to be done over the weekend due to pacemaker. EEG was negative.  8/18: MD called for neuro change. NIH 20 on neurologists assessment.LKW 1440 day prior. CTA showed Left A1 occlusion, CTP showed slow flow, but no penumbra. She was taken to IR for thrombectomy. Multiple subsequent attempts also unsuccessful  due to the hard plaque  precluding distal access. CT of brain revealed no evidence of  hemorrhage complications  8/19: MRI shows large acute left ACA territory infarct with petechial hemorrhage. Unchanged left A1 occlusion.  LTM EEG negative, discontinued 8/20: Weaning 8/23- plan for terminal extubation today  INTERIM HISTORY/SUBJECTIVE Patient is seen in her room with multiple family members at bedside.  She has been hemodynamically stable, and neurological exam is unchanged.  Family has discussed goals of care, and plan is for one-way extubation today when all family members can be present, likely around 6 or 7 PM.  OBJECTIVE  CBC    Component Value Date/Time   WBC 7.7 07/09/2023 0821   RBC 3.00 (L) 07/09/2023 0821   HGB 8.8 (L) 07/09/2023 0821   HGB 11.3 12/09/2018 1308   HCT 28.1 (L) 07/09/2023 0821   HCT 33.1 (L) 12/09/2018 1308   PLT 444 (H) 07/09/2023 0821   PLT 52 (LL) 12/09/2018 1308   MCV 93.7 07/09/2023 0821   MCV 92 12/09/2018 1308   MCH 29.3 07/09/2023 0821   MCHC 31.3 07/09/2023 0821   RDW 13.0 07/09/2023 0821   RDW 12.2 12/09/2018 1308   LYMPHSABS 1.1 07/05/2023 0406   LYMPHSABS 2.0 12/09/2018 1308   MONOABS 0.8  07/05/2023 0406   EOSABS 0.0 07/05/2023 0406   EOSABS 0.2 12/09/2018 1308   BASOSABS 0.1 07/05/2023 0406   BASOSABS 0.0 12/09/2018 1308    BMET    Component Value Date/Time   NA 140 07/09/2023 0821   NA 143 12/09/2018 1308   K 3.1 (L) 07/09/2023 0821   CL 103 07/09/2023 0821   CO2 27 07/09/2023 0821   GLUCOSE 227 (H) 07/09/2023 0821   BUN 15 07/09/2023 0821   BUN 8 12/09/2018 1308   CREATININE 0.62 07/09/2023 0821   CREATININE 0.73 08/03/2016 1519   CALCIUM 8.1 (L) 07/09/2023 0821   GFRNONAA >60 07/09/2023 1610    IMAGING past 24 hours No results found.  Vitals:   07/09/23 0600 07/09/23 0700 07/09/23 0800 07/09/23 0900  BP: (!) 133/48 (!) 158/52 (!) 169/63 (!) 175/58  Pulse: 76 77 92 89  Resp: 18 17 (!) 23 (!) 21  Temp:   100.2 F (37.9 C)   TempSrc:   Oral   SpO2: 100% 100% 100% 100%  Weight:      Height:       PHYSICAL EXAM General:  Alert, well-nourished, well-developed patient in no acute distress Psych:  Mood and affect appropriate for situation CV: Regular rate and rhythm on monitor Respiratory: Respirations synchronous with ventilator  NEURO:  patient is intubated, sedation off Pupils equal round and reactive to light, opens eyes to noxious stimuli, does not focus or track, cough reflex intact, spontaneous movement of left upper and lower extremities,  no movement of right upper and lower extremities   ASSESSMENT/PLAN  Stroke - Acute Large Left ACA territory infarct and punctate small infarcts left cerebellum and b/l hemishpere with Left A1 Occlusion s/p unsuccessful IR Etiology:  large vessel disease, ddx including cardiembolic source vs. Procedure related embolic shower  Code Stroke CT head 8/18: Stable head CT. No acute finding.  CTA head & neck Abrupt cut of at left A1 segment, acute-appearing.  CT perfusion  No infarct or penumbra by standard perfusion thresholds. But on TTP > 4.0, penumbra shown on left ACA territory MRI /MRA Large acute left ACA  territory infarct with petechial hemorrhage.  Subcentimeter acute infarcts in the left cerebellum and left greater than right posterior cerebral hemispheres. Unchanged left A1 occlusion  2D Echo: Severe MAC with moderate MS, EF >75%, Mild LVH, No shunt.  Pacemaker interrogation no afib EEG left frontotemporal focal area of cerebral dysfunction in the setting of more generalized region of cerebral dysfunction.  LDL 69 HgbA1c 8.9 UDS neg VTE prophylaxis - lovenox No antithrombotic prior to admission, now on aspirin 325 mg daily.  Therapy recommendations:  pending Disposition:  pending  Acute Respiratory Failure, post-procedure CCM Management, appreciate their assistance Propofol gtt, Fentanyl IVP PRN Wean as tolerated VAP bundle Not candidate for extubation today Family discussions regarding goals of care are ongoing, plan for one way extubation this evening when family can be present  S/p Pacemaker for 3rd Degree heart block Coronary Artery Disease / STEMI s/p stenting 2012 Hypertension Home meds:  lisinopril 20mg , on hold Pacemaker placed 2020, Med[employer]ronic dual-lead No afib found  BP goal < 180/105 given unsuccessful IR Long term BP goal normotensive  Hyperlipidemia Home meds:  lipitor 20mg  LDL 69, goal < 70 Now on lipitor 20 Continue statin at discharge  Diabetes type II, uncontrolled Home meds:  lantus, humalog HgbA1c 8.9, goal < 7.0 CBGs SSI On insulin  Recommend close follow-up with PCP for better DM control  Tobacco Abuse Patient smokes 1 pack per day for 50 years Nicotine replacement therapy provided Cessation education will be provided  Other stroke risk factors Advance age  Other acute issues Hx of ITP on Promacta, follows with oncology, platelet 422->417->395 Leukocytosis WBC 7.2->11.7 Fever Tmax 101.6, UA WBC 6-10, CXR neg, no leukocytosis Anemia, Hb 10.7->9.1->8.6   Hospital day # 5   Patient seen and examined by NP/APP with MD. MD to update note  as needed.   Madeline Picker, DNP, FNP-BC [facility] Pager: [phone]  ATTENDING NOTE: I reviewed above note and agree with the assessment and plan. Pt was seen and examined.   Both daughters are at the bedside. Pt neuro unchanged, not open eyes on voice, but briefly open eyes on pain stimulation, eyes more left gaze position, not blinking to visual threat, weak corneals, positive gag, LUE spontaneous movement, RUE flaccid, BLE withdraw to pain, L more than right. Family plan for one way extubation tonight with all the families present especially pt husband. If not tolerating extubation, will proceed with comfort care measures. Discussed with Dr. Craige Cotta.   For detailed assessment and plan, please refer to above/below as I have made changes wherever appropriate.   Marvel Plan, MD PhD Stroke Neurology 07/09/2023 9:14 PM  This patient is critically ill due to large left ACA stroke with left A1 occlusion, unsuccessful IR, respiratory failure and at significant risk of neurological worsening, death form recurrent stroke, hemorrhagic transformation, respiratory failure, seizure. This patient's care requires constant monitoring of vital signs,  hemodynamics, respiratory and cardiac monitoring, review of multiple databases, neurological assessment, discussion with family, other specialists and medical decision making of high complexity. I spent 30 minutes of neurocritical care time in the care of this patient. I had long discussion with daughters at bedside, updated pt current condition, treatment plan and potential prognosis, and answered all the questions. They expressed understanding and appreciation. I discussed with CCM Dr. Craige Cotta.

## 2023-07-09 NOTE — Progress Notes (Addendum)
Spoke with Dr. Lubertha Basque office. Pt has PPM, NOT ICD. Spoke with [employer] Rep Shari Prows. He confirms device is PPM, no action needed prior to extubation.

## 2023-07-09 NOTE — Progress Notes (Signed)
IV access obtained.

## 2023-07-09 NOTE — Progress Notes (Signed)
NAME:  NELIA HOSP, MRN:  [id number], DOB:  1934/03/23, LOS: 5 ADMISSION DATE:  07/03/2023, CONSULTATION DATE: 07/04/2023 REFERRING MD: Dr. Corliss Skains, Reason for consult: Ventilator dependence  History of Present Illness:  89 year old woman with diabetes, COPD, gastroparesis, hyperlipidemia, hypertension.  She has a pacemaker for third-degree heart block, ITP.  She was found on the floor by family on 8/17.  She was conscious but uncertain what had happened, whether she had fallen.  Subsequent head CT was negative for acute abnormality.  She had some sensation asymmetry and dysarthria.  She was admitted for neurological workup.  Then early morning 8/18 significant change in her neurological status.  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  She went for endovascular treatment and interventional radiology but revascularization was not possible due to hard atherosclerotic plaque.  Subsequent head CT did not show any evidence of hemorrhage or complication.  She returned to the ICU intubated and sedated.  Blood pressure goal 140-160.    Pertinent  Medical History   Past Medical History:  Diagnosis Date   Bradycardia    Coronary artery disease    Diabetes mellitus    Dyslipidemia    GERD (gastroesophageal reflux disease)    Hypothyroid    Presence of permanent cardiac pacemaker 12/16/2018   [employer]onic dual lead pacemaker    Significant Hospital Events: Including procedures, antibiotic start and stop dates in addition to other pertinent events     Interim History / Subjective:  No sig change overnight. Tol PS wean 10/5. Note discussions between stroke team and family yesterday - plan  for one way extubation this evening    Objective   Blood pressure (!) 169/63, pulse 92, temperature 100.2 F (37.9 C), temperature source Oral, resp. rate (!) 23, height 5\' 2"  (1.575 m), weight 58.4 kg, SpO2 100%.    Vent Mode: PSV;CPAP FiO2 (%):  [40 %] 40  % Set Rate:  [18 bmp] 18 bmp Vt Set:  [400 mL] 400 mL PEEP:  [5 cmH20] 5 cmH20 Pressure Support:  [10 cmH20-12 cmH20] 10 cmH20 Plateau Pressure:  [14 cmH20-17 cmH20] 17 cmH20   Intake/Output Summary (Last 24 hours) at 07/09/2023 6644 Last data filed at 07/09/2023 0700 Gross per 24 hour  Intake 2636.66 ml  Output 950 ml  Net 1686.66 ml   Filed Weights   07/07/23 0500 07/08/23 0500 07/09/23 0500  Weight: 56.5 kg 56.8 kg 58.4 kg    Examination: Gen:      Acutely ill appearing elderly female, NAD  HEENT:  ETT to vent, mm moist  Lungs:    resps even non labored on PS 10/5, clear  CV:         RRR Abd:      soft, non-tender; no palpable masses, no distension Ext:    No edema Skin:      Warm and dry; no rashes Neuro:   propofol off since yesterday, somnolent, withdraws to pain, does not follow commands    Resolved Hospital Problem list     Assessment & Plan:  89 year old woman with ventilator dependence post neuro IR procedure and due to impaired airway protection from CVA.  Unsuccessful revascularization in IR 8/18  Acute left anterior A1 CVA.  Unable to revascularize in IR due to plaque. - MRI shows left ACA infarct with petechial hemorrhage, chronic small vessel disease. Sub cm acute infarcts bilaterally P: -Antiplatelets and anticoagulation as per neurology recommendations -statin -Seizure precautions -BP control,  PRN labetalol    Ventilator dependence due to impaired airway protection and following procedure Tobacco use disorder/presumed COPD P:  -PRVC 8 cc/kg -PS wean as tol  -ongoing family discussions - plan per stroke team is for one-way extubation this evening. Suspect pt will tol this short term but will not adequately protect airway.  -currently off all sedation  Hypertension - holding home lisinopril  - discussed with neurology, permissive hypertension goal SBP<180  Diabetes mellitus Type 1 - continue lantus, SSI - CBGs elevated, will increase insulin  coverage -need to clarify goals of care with family - see discussions with plan for one-way extubation this evening, unclear if family wants to continue TF, etc or pursue full comfort   History of ITP -On Promacta as an outpatient, currently on hold  Hypothyroidism - levythyroxine through [facility]  Nutrition - tube feeds   Best Practice (right click and "Reselect all SmartList Selections" daily)   Diet/type: NPO with tube feeds DVT prophylaxis: SCD GI prophylaxis: PPI Lines: N/A Foley:  none Code Status:  full code Last date of multidisciplinary goals of care discussion [awaiting family arrival, will further discuss goals of care and extubation plan.]   The patient is critically ill due to stroke, respiratory failure.  Critical care was necessary to treat or prevent imminent or life-threatening deterioration.  Critical care was time spent personally by me on the following activities: development of treatment plan with patient and/or surrogate as well as nursing, discussions with consultants, evaluation of patient's response to treatment, examination of patient, obtaining history from patient or surrogate, ordering and performing treatments and interventions, ordering and review of laboratory studies, ordering and review of radiographic studies, pulse oximetry, re-evaluation of patient's condition and participation in multidisciplinary rounds.   Critical Care Time devoted to patient care services described in this note is 32 minutes. This time reflects time of care of this signee [employer] . This critical care time does not reflect separately billable procedures or procedure time, teaching time or supervisory time of PA/NP/Med student/Med Resident etc but could involve care discussion time.      Dirk Dress, NP Pulmonary/Critical Care Medicine  07/09/2023  8:38 AM   See Loretha Stapler for personal pager PCCM on call pager [phone] until 7pm. Please call Elink 7p-7a.  [phone]

## 2023-07-10 DIAGNOSIS — Z515 Encounter for palliative care: Secondary | ICD-10-CM | POA: Diagnosis not present

## 2023-07-10 DIAGNOSIS — I63512 Cerebral infarction due to unspecified occlusion or stenosis of left middle cerebral artery: Secondary | ICD-10-CM

## 2023-07-10 DIAGNOSIS — I63522 Cerebral infarction due to unspecified occlusion or stenosis of left anterior cerebral artery: Secondary | ICD-10-CM

## 2023-07-10 DIAGNOSIS — R4182 Altered mental status, unspecified: Secondary | ICD-10-CM | POA: Diagnosis not present

## 2023-07-10 DIAGNOSIS — Z7189 Other specified counseling: Secondary | ICD-10-CM | POA: Diagnosis not present

## 2023-07-10 LAB — GLUCOSE, CAPILLARY
Glucose-Capillary: 107 mg/dL — ABNORMAL HIGH (ref 70–99)
Glucose-Capillary: 115 mg/dL — ABNORMAL HIGH (ref 70–99)
Glucose-Capillary: 180 mg/dL — ABNORMAL HIGH (ref 70–99)
Glucose-Capillary: 182 mg/dL — ABNORMAL HIGH (ref 70–99)
Glucose-Capillary: 186 mg/dL — ABNORMAL HIGH (ref 70–99)
Glucose-Capillary: 194 mg/dL — ABNORMAL HIGH (ref 70–99)

## 2023-07-10 MED ORDER — ORAL CARE MOUTH RINSE
15.0000 mL | OROMUCOSAL | Status: DC
  Administered 2023-07-10 – 2023-07-11 (×7): 15 mL via OROMUCOSAL

## 2023-07-10 NOTE — Progress Notes (Addendum)
STROKE TEAM PROGRESS NOTE   BRIEF HPI Madeline [name] is a 89 y.o. female with history of diabetes, COPD, diabetic gastroparesis, hyperlipidemia, hypertension, pacemaker and hypothyroidism admitted yesterday after being found down between her bed and bedside table with mild L leg weakness, confusion, and mild dysarthria.  LKW 1008/17.  SIGNIFICANT HOSPITAL EVENTS 8/17: admitted for stroke vs encephalopathy workup. CT negative. MRI was unable to be done over the weekend due to pacemaker. EEG was negative.  8/18: MD called for neuro change. NIH 20 on neurologists assessment.LKW 1440 day prior. CTA showed Left A1 occlusion, CTP showed slow flow, but no penumbra. She was taken to IR for thrombectomy. Multiple subsequent attempts also unsuccessful  due to the hard plaque  precluding distal access. CT of brain revealed no evidence of  hemorrhage complications  8/19: MRI shows large acute left ACA territory infarct with petechial hemorrhage. Unchanged left A1 occlusion.  LTM EEG negative, discontinued 8/20: Weaning 8/23- plan for terminal extubation   INTERIM HISTORY/SUBJECTIVE Madeline [name] is seen in her room with no family at the bedside, appears comfortable on 3L [place].  Appreciate palliative care assistance in management of goals of care and Madeline [name] care  OBJECTIVE  CBC    Component Value Date/Time   WBC 7.7 07/09/2023 0821   RBC 3.00 (L) 07/09/2023 0821   HGB 8.8 (L) 07/09/2023 0821   HGB 11.3 12/09/2018 1308   HCT 28.1 (L) 07/09/2023 0821   HCT 33.1 (L) 12/09/2018 1308   PLT 444 (H) 07/09/2023 0821   PLT 52 (LL) 12/09/2018 1308   MCV 93.7 07/09/2023 0821   MCV 92 12/09/2018 1308   MCH 29.3 07/09/2023 0821   MCHC 31.3 07/09/2023 0821   RDW 13.0 07/09/2023 0821   RDW 12.2 12/09/2018 1308   LYMPHSABS 1.1 07/05/2023 0406   LYMPHSABS 2.0 12/09/2018 1308   MONOABS 0.8 07/05/2023 0406   EOSABS 0.0 07/05/2023 0406   EOSABS 0.2 12/09/2018 1308   BASOSABS 0.1 07/05/2023 0406   BASOSABS  0.0 12/09/2018 1308    BMET    Component Value Date/Time   NA 140 07/09/2023 0821   NA 143 12/09/2018 1308   K 3.1 (L) 07/09/2023 0821   CL 103 07/09/2023 0821   CO2 27 07/09/2023 0821   GLUCOSE 227 (H) 07/09/2023 0821   BUN 15 07/09/2023 0821   BUN 8 12/09/2018 1308   CREATININE 0.62 07/09/2023 0821   CREATININE 0.73 08/03/2016 1519   CALCIUM 8.1 (L) 07/09/2023 0821   GFRNONAA >60 07/09/2023 4098    IMAGING past 24 hours No results found.  Vitals:   07/10/23 1000 07/10/23 1100 07/10/23 1200 07/10/23 1300  BP: (!) 170/58 (!) 165/59 (!) 169/59 (!) 143/52  Pulse:   88 87  Resp: (!) 24 (!) 24 (!) 26 (!) 26  Temp:   (!) 101.5 F (38.6 C)   TempSrc:   Axillary   SpO2:   95% 91%  Weight:      Height:       PHYSICAL EXAM General:  Alert, well-nourished, well-developed Madeline [name] in no acute distress Psych:  Mood and affect appropriate for situation CV: Regular rate and rhythm on monitor Respiratory: Respirations synchronous with ventilator  NEURO:  Madeline [name] is intubated, sedation off Pupils equal round and reactive to light, opens eyes to noxious stimuli, does not focus or track, cough reflex intact, spontaneous movement of left upper and lower extremities, no movement of right upper and lower extremities   ASSESSMENT/PLAN  Stroke - Acute Large  Left ACA territory infarct and punctate small infarcts left cerebellum and b/l hemishpere with Left A1 Occlusion s/p unsuccessful IR Etiology:  large vessel disease, ddx including cardiembolic source vs. Procedure related embolic shower  Code Stroke CT head 8/18: Stable head CT. No acute finding.  CTA head & neck Abrupt cut of at left A1 segment, acute-appearing.  CT perfusion  No infarct or penumbra by standard perfusion thresholds. But on TTP > 4.0, penumbra shown on left ACA territory MRI /MRA Large acute left ACA territory infarct with petechial hemorrhage.  Subcentimeter acute infarcts in the left cerebellum and left greater  than right posterior cerebral hemispheres. Unchanged left A1 occlusion  2D Echo: Severe MAC with moderate MS, EF >75%, Mild LVH, No shunt.  Pacemaker interrogation no afib EEG left frontotemporal focal area of cerebral dysfunction in the setting of more generalized region of cerebral dysfunction.  LDL 69 HgbA1c 8.9 UDS neg VTE prophylaxis - lovenox No antithrombotic prior to admission, no anti thrombolytic as she has no oral access  Therapy recommendations:  pending Disposition:  pending  Acute Respiratory Failure, post-procedure CCM Management, appreciate their assistance Propofol gtt, Fentanyl IVP PRN Wean as tolerated VAP bundle Not candidate for extubation today Extubated 8/24  S/p Pacemaker for 3rd Degree heart block Coronary Artery Disease / STEMI s/p stenting 2012 Hypertension Home meds:  lisinopril 20mg , on hold Pacemaker placed 2020, MedTronic dual-lead No afib found  BP goal < 180/105 given unsuccessful IR Long term BP goal normotensive  Hyperlipidemia Home meds:  lipitor 20mg  LDL 69, goal < 70 Now on lipitor 20 - on hold Continue statin at discharge  Diabetes type II, uncontrolled Home meds:  lantus, humalog HgbA1c 8.9, goal < 7.0 CBGs SSI On insulin  Recommend close follow-up with PCP for better DM control  Tobacco Abuse Madeline [name] smokes 1 pack per day for 50 years Nicotine replacement therapy provided Cessation education will be provided  Other stroke risk factors Advance age  Other acute issues Hx of ITP on Promacta, follows with oncology, platelet 422->417->395 Leukocytosis WBC 7.2->11.7 Fever Tmax 101.6, UA WBC 6-10, CXR neg, no leukocytosis Anemia, Hb 10.7->9.1->8.6   Hospital day # 6   Madeline [name] seen and examined by NP/APP with MD. MD to update note as needed.   Elmer Picker, DNP, FNP-BC [facility] Pager: [phone]   This Madeline [name] is critically ill due to acute stroke due to left A1 occlusion and at significant risk  of neurological worsening, death form brain edema, brain bleed and respiratory failure as she was difficult to wean from the vent. She had a one way extubation yesterday after family had a discussion with teams and palliative care. This Madeline [name]'s care requires constant monitoring of vital signs, hemodynamics, respiratory and cardiac monitoring, review of multiple databases, neurological assessment, discussion with family, other specialists and medical decision making of high complexity. I spent 30 minutes of neurocritical care time in the care of this Madeline [name].

## 2023-07-10 NOTE — Progress Notes (Addendum)
Daily Progress Note   Patient Name: Madeline [name]       Date: 07/10/2023 DOB: 01-22-1934  Age: 89 y.o. MRN#: [id number] Attending Physician: Stroke, Md, MD Primary Care Physician: Marguarite Arbour, MD Admit Date: 07/03/2023  Reason for Consultation/Follow-up: Establishing goals of care  Length of Stay: 6  Current Medications: Scheduled Meds:   aspirin  325 mg Per Tube Daily   atorvastatin  20 mg Per Tube q1800   Chlorhexidine Gluconate Cloth  6 each Topical Daily   docusate  100 mg Per Tube BID   enoxaparin (LOVENOX) injection  40 mg Subcutaneous Q24H   insulin aspart  0-15 Units Subcutaneous Q4H   insulin aspart  3 Units Subcutaneous Q4H   insulin glargine-yfgn  10 Units Subcutaneous Daily   levothyroxine  75 mcg Per Tube Q0600   multivitamin with minerals  1 tablet Per Tube Daily   mouth rinse  15 mL Mouth Rinse Q4H   scopolamine  1 patch Transdermal Q72H    Continuous Infusions:   PRN Meds: acetaminophen **OR** acetaminophen (TYLENOL) oral liquid 160 mg/5 mL **OR** acetaminophen, diphenhydrAMINE, labetalol, LORazepam, morphine injection, mouth rinse  Physical Exam Vitals reviewed.  Constitutional:      Appearance: She is ill-appearing.  HENT:     Head: Normocephalic and atraumatic.     Mouth/Throat:     Mouth: Mucous membranes are dry.  Pulmonary:     Effort: Tachypnea present.  Skin:    General: Skin is warm.             Vital Signs: BP (!) 120/43   Pulse 76   Temp (!) 101.5 F (38.6 C) (Axillary)   Resp (!) 24   Ht 5\' 2"  (1.575 m)   Wt 58.4 kg   SpO2 94%   BMI 23.55 kg/m  SpO2: SpO2: 94 % O2 Device: O2 Device: Nasal Cannula O2 Flow Rate: O2 Flow Rate (L/min): 2 L/min       Palliative Assessment/Data: 10%       Palliative Care Assessment &  Plan   Patient Profile: 89 y.o. female  with past medical history of history of diabetes, COPD, diabetic gastroparesis, hyperlipidemia, hypertension, pacemaker and hypothyroidism admitted on 07/03/2023 for neurological work up after being found down between her bed and bedside table with mild L leg weakness,  confusion, and mild dysarthria.     On 07/04/2023 she had a significant change in her neurological status. 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 She went for endovascular treatment and interventional radiology but revascularization was not possible due to hard atherosclerotic plaque.   Yesterday 07/09/23 she was extubated with no plan for reintubation.  Assessment: Extensive chart review has been completed prior to meeting with patient's family including labs, vital signs, imaging, progress/consult notes, orders, medications and available advance directive documents.  1200: Met with patient's two daughters with her husband also at bedside. Patient's vitals have been stable since extubation. Family are hopeful she will have enough recovery to "wake up." We discussed hoping for the best but also preparing for if she does not "wake up." The daughters have been clear that their mother would not want a feeding tube. We discussed that if her neuro status does not improve she will not be able to take in nutrition and hospice would be an option at that time.  1410: Met with the patient's daughter Juliette Alcide and Elmer Picker NP with neurology. The patient's daughter was given an update and was able to see the stroke on the MRI. Devon shared that it was unlikely the patient would have a significant neurological improvement due to the severity of her stroke. We discussed hospice as an option. Discussed hospice philosophy and what hospice may look like for this patient. The daughter feels confident that her mother would not want to be in a nursing home, as the  patient's father was in one and had a negative experience. She would like to discuss options with her sister and will let us know their decision.  1700: Family requested PMT visit. Elmer Picker NP was also there to update the patient's daughter Elita Quick and show her imaging. We discussed what full comfort and hospice would look like for this patient. They plan to decide tomorrow.  Recommendations/Plan: DNR Continued discussion with family re: goals now; treatment or full comfort/hospice? PMT support   Code Status:    Code Status Orders  (From admission, onward)           Start     Ordered   07/09/23 1010  Do not attempt resuscitation (DNR)  Continuous       Question Answer Comment  If patient has no pulse and is not breathing Do Not Attempt Resuscitation   If patient has a pulse and/or is breathing: Medical Treatment Goals COMFORT MEASURES: Keep clean/warm/dry, use medication by any route; positioning, wound care and other measures to relieve pain/suffering; use oxygen, suction/manual treatment of airway obstruction for comfort; do not transfer unless for comfort needs.   Consent: Discussion documented in EHR or advanced directives reviewed      07/09/23 1010             Care plan was discussed with Elmer Picker NP and bedside RN  Time total: 130 minutes  Thank you for allowing the Palliative Medicine Team to assist in the care of this patient.     Sherryll Burger, NP  Please contact Palliative Medicine Team phone at [phone] for questions and concerns.

## 2023-07-10 NOTE — Plan of Care (Signed)
Palliative, NP and family communicated. Waiting on family decisions. VS stable, no change in assessment. Bed in low position, alarms are on, call bell in reach, family at the bedside. Supportive and appropriate to the situation

## 2023-07-10 NOTE — Progress Notes (Signed)
Pt extubated 07/09/23.  No plan for reintubation.  Palliative care medicine consulted.  DNR.  PCCM will sign off.  Please call if additional assistance needed while she is in hospital.  Coralyn Helling, MD [facility] Pulmonary/Critical Care Pager - [phone] or [phone] 07/10/2023, 7:21 AM

## 2023-07-11 DIAGNOSIS — Z7189 Other specified counseling: Secondary | ICD-10-CM | POA: Diagnosis not present

## 2023-07-11 DIAGNOSIS — Z515 Encounter for palliative care: Secondary | ICD-10-CM | POA: Diagnosis not present

## 2023-07-11 DIAGNOSIS — R4182 Altered mental status, unspecified: Secondary | ICD-10-CM | POA: Diagnosis not present

## 2023-07-11 LAB — GLUCOSE, CAPILLARY
Glucose-Capillary: 175 mg/dL — ABNORMAL HIGH (ref 70–99)
Glucose-Capillary: 122 mg/dL — ABNORMAL HIGH (ref 70–99)
Glucose-Capillary: 100 mg/dL — ABNORMAL HIGH (ref 70–99)
Glucose-Capillary: 106 mg/dL — ABNORMAL HIGH (ref 70–99)

## 2023-07-11 MED ORDER — GLYCOPYRROLATE 0.2 MG/ML IJ SOLN: 0.2000 mg | INTRAMUSCULAR | Status: DC | PRN

## 2023-07-11 MED ORDER — DIPHENHYDRAMINE HCL 50 MG/ML IJ SOLN: 25.0000 mg | INTRAMUSCULAR | Status: DC | PRN

## 2023-07-11 MED ORDER — HALOPERIDOL LACTATE 5 MG/ML IJ SOLN: 2.0000 mg | INTRAMUSCULAR | Status: DC | PRN

## 2023-07-11 MED ORDER — HALOPERIDOL LACTATE 2 MG/ML PO CONC: 2.0000 mg | Freq: Four times a day (QID) | ORAL | Status: DC | PRN

## 2023-07-11 MED ORDER — GLYCOPYRROLATE 1 MG PO TABS: 1.0000 mg | ORAL_TABLET | ORAL | Status: DC | PRN

## 2023-07-11 MED ORDER — HALOPERIDOL 1 MG PO TABS: 2.0000 mg | ORAL_TABLET | Freq: Four times a day (QID) | ORAL | Status: DC | PRN

## 2023-07-11 MED ORDER — POLYVINYL ALCOHOL 1.4 % OP SOLN: 1.0000 [drp] | Freq: Four times a day (QID) | OPHTHALMIC | Status: DC | PRN

## 2023-07-11 MED ORDER — GLYCOPYRROLATE 0.2 MG/ML IJ SOLN
0.2000 mg | INTRAMUSCULAR | Status: DC | PRN
  Administered 2023-07-11: 0.2 mg via INTRAVENOUS
  Filled 2023-07-11: qty 1

## 2023-07-11 MED ORDER — LORAZEPAM 1 MG PO TABS: 1.0000 mg | ORAL_TABLET | ORAL | Status: DC | PRN

## 2023-07-11 MED ORDER — LORAZEPAM 2 MG/ML IJ SOLN: 1.0000 mg | INTRAMUSCULAR | Status: DC | PRN

## 2023-07-11 MED ORDER — LORAZEPAM 2 MG/ML PO CONC: 1.0000 mg | ORAL | Status: DC | PRN

## 2023-07-11 MED ORDER — ONDANSETRON 4 MG PO TBDP: 4.0000 mg | ORAL_TABLET | Freq: Four times a day (QID) | ORAL | Status: DC | PRN

## 2023-07-11 MED ORDER — BIOTENE DRY MOUTH MT LIQD
15.0000 mL | Freq: Two times a day (BID) | OROMUCOSAL | Status: DC
  Administered 2023-07-11 – 2023-07-12 (×2): 15 mL via TOPICAL

## 2023-07-11 MED ORDER — MORPHINE SULFATE (PF) 2 MG/ML IV SOLN: 1.0000 mg | INTRAVENOUS | Status: DC | PRN

## 2023-07-11 MED ORDER — ONDANSETRON HCL 4 MG/2ML IJ SOLN: 4.0000 mg | Freq: Four times a day (QID) | INTRAMUSCULAR | Status: DC | PRN

## 2023-07-11 MED ORDER — SODIUM CHLORIDE 0.9 % IV BOLUS: 500.0000 mL | Freq: Once | INTRAVENOUS | Status: DC | PRN

## 2023-07-11 NOTE — Progress Notes (Addendum)
STROKE TEAM PROGRESS NOTE   BRIEF HPI [name] is a 89 y.o. female with history of diabetes, COPD, diabetic gastroparesis, hyperlipidemia, hypertension, pacemaker and hypothyroidism admitted yesterday after being found down between her bed and bedside table with mild L leg weakness, confusion, and mild dysarthria.  LKW 1008/17.  SIGNIFICANT HOSPITAL EVENTS 8/17: admitted for stroke vs encephalopathy workup. CT negative. MRI was unable to be done over the weekend due to pacemaker. EEG was negative.  8/18: MD called for neuro change. NIH 20 on neurologists assessment.LKW 1440 day prior. CTA showed Left A1 occlusion, CTP showed slow flow, but no penumbra. She was taken to IR for thrombectomy. Multiple subsequent attempts also unsuccessful  due to the hard plaque  precluding distal access. CT of brain revealed no evidence of  hemorrhage complications  8/19: MRI shows large acute left ACA territory infarct with petechial hemorrhage. Unchanged left A1 occlusion.  LTM EEG negative, discontinued 8/20: Weaning 8/23- plan for terminal extubation  8/25- officially transitioned to comfort care  INTERIM HISTORY/SUBJECTIVE Transitioning to comfort care at 4pm today per family wishes. Palliative care team following with [place], appreciate assistance. Family requested additional time for further discussion among family prior to transitioning to comfort care despite extubation on 8/23. Maintaining oxygen saturation hemodynamically stable. No response to verbal stimuli, no verbal output.   OBJECTIVE  CBC    Component Value Date/Time   WBC 7.7 07/09/2023 0821   RBC 3.00 (L) 07/09/2023 0821   HGB 8.8 (L) 07/09/2023 0821   HGB 11.3 12/09/2018 1308   HCT 28.1 (L) 07/09/2023 0821   HCT 33.1 (L) 12/09/2018 1308   PLT 444 (H) 07/09/2023 0821   PLT 52 (LL) 12/09/2018 1308   MCV 93.7 07/09/2023 0821   MCV 92 12/09/2018 1308   MCH 29.3 07/09/2023 0821   MCHC 31.3 07/09/2023 0821   RDW 13.0 07/09/2023  0821   RDW 12.2 12/09/2018 1308   LYMPHSABS 1.1 07/05/2023 0406   LYMPHSABS 2.0 12/09/2018 1308   MONOABS 0.8 07/05/2023 0406   EOSABS 0.0 07/05/2023 0406   EOSABS 0.2 12/09/2018 1308   BASOSABS 0.1 07/05/2023 0406   BASOSABS 0.0 12/09/2018 1308    BMET    Component Value Date/Time   NA 140 07/09/2023 0821   NA 143 12/09/2018 1308   K 3.1 (L) 07/09/2023 0821   CL 103 07/09/2023 0821   CO2 27 07/09/2023 0821   GLUCOSE 227 (H) 07/09/2023 0821   BUN 15 07/09/2023 0821   BUN 8 12/09/2018 1308   CREATININE 0.62 07/09/2023 0821   CREATININE 0.73 08/03/2016 1519   CALCIUM 8.1 (L) 07/09/2023 0821   GFRNONAA >60 07/09/2023 1610    IMAGING past 24 hours No results found.  Vitals:   07/11/23 1300 07/11/23 1400 07/11/23 1500 07/11/23 1600  BP: (!) 167/63 (!) 174/67 (!) 168/63 (!) 178/65  Pulse: 77 75 80   Resp: 20 18 17  (!) 25  Temp:    99.2 F (37.3 C)  TempSrc:    Axillary  SpO2: 99% 100% 96%   Weight:      Height:       PHYSICAL EXAM General:  Alert, well-nourished, well-developed patient in no acute distress Psych:  Mood and affect appropriate for situation CV: Regular rate and rhythm on monitor Respiratory: Respirations unlabored  NEURO:  Unresponsive to verbal stimuli Pupils equal round and reactive to light, opens eyes to noxious stimuli, does not focus or track, cough reflex intact, spontaneous movement of left upper  and lower extremities, no movement of right upper and lower extremities   ASSESSMENT/PLAN  Stroke - Acute Large Left ACA territory infarct and punctate small infarcts left cerebellum and b/l hemishpere with Left A1 Occlusion s/p unsuccessful IR Etiology:  large vessel disease, ddx including cardiembolic source vs. Procedure related embolic shower  Code Stroke CT head 8/18: Stable head CT. No acute finding.  CTA head & neck Abrupt cut of at left A1 segment, acute-appearing.  CT perfusion  No infarct or penumbra by standard perfusion thresholds. But  on TTP > 4.0, penumbra shown on left ACA territory MRI /MRA Large acute left ACA territory infarct with petechial hemorrhage.  Subcentimeter acute infarcts in the left cerebellum and left greater than right posterior cerebral hemispheres. Unchanged left A1 occlusion  2D Echo: Severe MAC with moderate MS, EF >75%, Mild LVH, No shunt.  Pacemaker interrogation no afib EEG left frontotemporal focal area of cerebral dysfunction in the setting of more generalized region of cerebral dysfunction.  LDL 69 HgbA1c 8.9 UDS neg VTE prophylaxis - lovenox No antithrombotic prior to admission, no anti thrombolytic as she has no oral access  Therapy recommendations:  no follow up Disposition: Palliative care  Acute Respiratory Failure, post-procedure CCM Management, appreciate their assistance Propofol gtt, Fentanyl IVP PRN Wean as tolerated VAP bundle Not candidate for extubation today Extubated 8/24  S/p Pacemaker for 3rd Degree heart block Coronary Artery Disease / STEMI s/p stenting 2012 Hypertension Home meds:  lisinopril 20mg , on hold Pacemaker placed 2020, Med[employer]ronic dual-lead No afib found  BP goal < 180/105 given unsuccessful IR Long term BP goal normotensive  Hyperlipidemia Home meds:  lipitor 20mg  LDL 69, goal < 70  Diabetes type II, uncontrolled Home meds:  lantus, humalog Discontinue checks- comfort care  Tobacco Abuse Patient smokes 1 pack per day for 50 years Nicotine replacement therapy provided  Other stroke risk factors Advance age  Other acute issues Hx of ITP on Promacta, follows with oncology, platelet 422->417->395 Leukocytosis WBC 7.2->11.7 Fever Tmax 101.6, UA WBC 6-10, CXR neg, no leukocytosis Anemia, Hb 10.7->9.1->8.6   Hospital day # 7   Patient seen and examined by NP/APP with MD. MD to update note as needed.   Elmer Picker, DNP, FNP-BC [facility] Pager: [phone]   ATTENDING NOTE: I reviewed above note and agree with the  assessment and plan. Pt was seen and examined. I spent a total of 25 minutes dedicated to the care of this patient.  For detailed assessment and plan, please refer to above/below as I have made changes wherever appropriate.   Windell Norfolk, MD  Stroke Neurology 07/11/2023 6:34 PM

## 2023-07-11 NOTE — Progress Notes (Addendum)
Daily Progress Note   Patient Name: Madeline [name]       Date: 07/11/2023 DOB: 11/09/1934  Age: 89 y.o. MRN#: [id number] Attending Physician: Stroke, Md, MD Primary Care Physician: Marguarite Arbour, MD Admit Date: 07/03/2023  Reason for Consultation/Follow-up: Establishing goals of care  Length of Stay: 7  Current Medications: Scheduled Meds:   aspirin  325 mg Per Tube Daily   atorvastatin  20 mg Per Tube q1800   Chlorhexidine Gluconate Cloth  6 each Topical Daily   docusate  100 mg Per Tube BID   enoxaparin (LOVENOX) injection  40 mg Subcutaneous Q24H   insulin aspart  0-15 Units Subcutaneous Q4H   insulin glargine-yfgn  10 Units Subcutaneous Daily   levothyroxine  75 mcg Per Tube Q0600   multivitamin with minerals  1 tablet Per Tube Daily   mouth rinse  15 mL Mouth Rinse Q4H   scopolamine  1 patch Transdermal Q72H    Continuous Infusions:   PRN Meds: acetaminophen **OR** acetaminophen (TYLENOL) oral liquid 160 mg/5 mL **OR** acetaminophen, diphenhydrAMINE, labetalol, mouth rinse  Physical Exam Vitals reviewed.  Constitutional:      Appearance: She is ill-appearing.  HENT:     Head: Normocephalic and atraumatic.     Mouth/Throat:     Mouth: Mucous membranes are dry.  Pulmonary:     Effort: Pulmonary effort is normal. Tachypnea present.  Skin:    General: Skin is warm and dry.             Vital Signs: BP (!) 148/53   Pulse 78   Temp 100 F (37.8 C) (Axillary)   Resp (!) 23   Ht 5\' 2"  (1.575 m)   Wt 55 kg   SpO2 98%   BMI 22.18 kg/m  SpO2: SpO2: 98 % O2 Device: O2 Device: Nasal Cannula O2 Flow Rate: O2 Flow Rate (L/min): 2 L/min       Palliative Assessment/Data: 10%       Palliative Care Assessment & Plan   Patient Profile: 89 y.o. female  with  past medical history of history of diabetes, COPD, diabetic gastroparesis, hyperlipidemia, hypertension, pacemaker and hypothyroidism admitted on 07/03/2023 for neurological work up after being found down between her bed and bedside table with mild L leg weakness, confusion, and mild dysarthria.  On 07/04/2023 she had a significant change in her neurological status. 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 She went for endovascular treatment and interventional radiology but revascularization was not possible due to hard atherosclerotic plaque.   On 07/09/23 she was extubated with no plan for reintubation. Family continues to have difficulty deciding plan of care.  Assessment: Extensive chart review has been completed prior to meeting with patient's family including labs, vital signs, imaging, progress/consult notes, orders, medications and available advance directive documents.  Received a message from neuro NP that the patient's family is concerned about diabetes management at end of life and does not want her to go to inpatient hospice if they cannot monitor/ treat glucose levels. Messaged hospice liaison to confirm they cannot check glucose levels. Met with patient's daughter in room. Addressed her concerns and educated her on the hospice philosophy and why glucose management does not happen in their practice. She was concerned that not managing glucose would be the cause of her death/ family would be responsible for her death. Re-educated her that the patient's chronic and acute conditions and her inability to eat/drink will lead to a natural death. We discussed that hospice/ full comfort care would ensure she does not suffer during her dying time. She understands. Rest of family plans to meet this afternoon to confirm decision.  1100: Received a call from the patient's daughter Madeline [name]. Answered questions about process of transition to comfort care. Family  plans to change her to comfort late this afternoon.  1420: Patient's family has decided to transition her to full comfort. Comfort orders placed. Plan is for patient to transfer to 6N while awaiting evaluation from inpatient hospice. Transition of care order placed.  Recommendations/Plan: DNR Full comfort: comfort meds per MAR TOC order placed for inpatient hospice evaluation PMT support   Code Status:    Code Status Orders  (From admission, onward)           Start     Ordered   07/09/23 1010  Do not attempt resuscitation (DNR)  Continuous       Question Answer Comment  If patient has no pulse and is not breathing Do Not Attempt Resuscitation   If patient has a pulse and/or is breathing: Medical Treatment Goals COMFORT MEASURES: Keep clean/warm/dry, use medication by any route; positioning, wound care and other measures to relieve pain/suffering; use oxygen, suction/manual treatment of airway obstruction for comfort; do not transfer unless for comfort needs.   Consent: Discussion documented in EHR or advanced directives reviewed      07/09/23 1010             Care plan was discussed with Elmer Picker NP and bedside RN Madeline [name]  Time total:  60 minutes plus 15 additional minutes  Thank you for allowing the Palliative Medicine Team to assist in the care of this patient.     Sherryll Burger, NP  Please contact Palliative Medicine Team phone at [phone] for questions and concerns.

## 2023-07-12 DIAGNOSIS — R0609 Other forms of dyspnea: Secondary | ICD-10-CM

## 2023-07-12 DIAGNOSIS — Z515 Encounter for palliative care: Secondary | ICD-10-CM

## 2023-07-12 MED ORDER — HALOPERIDOL LACTATE 2 MG/ML PO CONC: 2.0000 mg | Freq: Four times a day (QID) | ORAL | 0 refills | Status: DC | PRN

## 2023-07-12 MED ORDER — LORAZEPAM 2 MG/ML PO CONC: 1.0000 mg | ORAL | 0 refills | Status: DC | PRN

## 2023-07-12 MED ORDER — GLYCOPYRROLATE 0.2 MG/ML IJ SOLN: 0.2000 mg | INTRAMUSCULAR | 0 refills | Status: DC | PRN

## 2023-07-12 MED ORDER — BIOTENE DRY MOUTH MT LIQD: 15.0000 mL | Freq: Two times a day (BID) | OROMUCOSAL | 0 refills | Status: DC

## 2023-07-12 MED ORDER — MORPHINE SULFATE (PF) 2 MG/ML IV SOLN: 1.0000 mg | INTRAVENOUS | 0 refills | Status: DC | PRN

## 2023-07-12 MED ORDER — ONDANSETRON 4 MG PO TBDP: 4.0000 mg | ORAL_TABLET | Freq: Four times a day (QID) | ORAL | 0 refills | Status: DC | PRN

## 2023-07-12 MED ORDER — POLYVINYL ALCOHOL 1.4 % OP SOLN: 1.0000 [drp] | Freq: Four times a day (QID) | OPHTHALMIC | 0 refills | Status: DC | PRN

## 2023-07-12 MED ORDER — ACETAMINOPHEN 650 MG RE SUPP: 650.0000 mg | RECTAL | 0 refills | Status: DC | PRN

## 2023-07-12 MED ORDER — SCOPOLAMINE 1 MG/3DAYS TD PT72: 1.0000 | MEDICATED_PATCH | TRANSDERMAL | 12 refills | Status: DC

## 2023-07-12 MED ORDER — DIPHENHYDRAMINE HCL 50 MG/ML IJ SOLN: 25.0000 mg | INTRAMUSCULAR | 0 refills | Status: DC | PRN

## 2023-07-12 NOTE — TOC Initial Note (Signed)
Transition of Care [facility]) - Initial/Assessment Note    Patient Details  Name: Madeline [name] MRN: [id number] Date of Birth: 02-Feb-1934  Transition of Care [facility]) CM/SW Contact:    Carley Hammed, LCSW Phone Number: 07/12/2023, 1:46 PM  Clinical Narrative:                 CSW acknowledges consult for residential hospice placement. CSW met with family at bedside to discuss available options. Family states they would like to proceed with [facility]. CSW explained next steps, and contacted Misty w/ [facility] to begin process. TOC will continue to follow for DC planning needs.   Expected Discharge Plan: Hospice Medical Facility Barriers to Discharge: Other (must enter comment) (Hospice review)   Patient Goals and CMS Choice Patient states their goals for this hospitalization and ongoing recovery are:: Pt unresponsive and unable to participate in goal setting. CMS Medicare.gov Compare Post Acute Care list provided to:: Patient Represenative (must comment) Choice offered to / list presented to : Adult Children, Spouse      Expected Discharge Plan and Services In-house Referral: Clinical Social Work, Hospice / Palliative Care   Post Acute Care Choice: Residential Hospice Bed Living arrangements for the past 2 months: Single Family Home                                      Prior Living Arrangements/Services Living arrangements for the past 2 months: Single Family Home Lives with:: Spouse Patient language and need for interpreter reviewed:: Yes Do you feel safe going back to the place where you live?: Yes      Need for Family Participation in Patient Care: Yes (Comment) Care giver support system in place?: Yes (comment)   Criminal Activity/Legal Involvement Pertinent to Current Situation/Hospitalization: No - Comment as needed  Activities of Daily Living      Permission Sought/Granted Permission sought to share information with : Family Supports, Passenger transport manager granted to share information with : Yes, Verbal Permission Granted  Share Information with NAME: [name]  Permission granted to share info w AGENCY: [facility]  Permission granted to share info w Relationship: Adult children     Emotional Assessment Appearance:: Appears stated age Attitude/Demeanor/Rapport: Unable to Assess Affect (typically observed): Unable to Assess Orientation: :  (Unresponsive) Alcohol / Substance Use: Not Applicable Psych Involvement: No (comment)  Admission diagnosis:  Altered mental state [R41.82] Altered mental status, unspecified altered mental status type [R41.82] Syncope, unspecified syncope type [R55] Anterior cerebral circulation infarction, left [facility]) [I63.522] Patient Active Problem List   Diagnosis Date Noted   Seizure (HCC) 07/05/2023   Cerebrovascular accident (CVA) due to occlusion of left middle cerebral artery (HCC) 07/04/2023   Anterior cerebral circulation infarction, left (HCC) 07/04/2023   Altered mental state 07/03/2023   Hypoglycemia 04/11/2023   Chronic diastolic CHF (congestive heart failure) (HCC) 10/04/2022   Diabetes mellitus without complication (HCC) 10/04/2022   Protein-calorie malnutrition, moderate (HCC) 10/04/2022   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) 12/22/2021   Hypotension 12/16/2021   Acute metabolic encephalopathy 12/16/2021   AKI (acute kidney injury) (HCC) 12/16/2021   Elevated troponin 12/16/2021   Hyperkalemia 12/16/2021   Depression 12/16/2021   DKA (diabetic ketoacidosis) (HCC) 12/15/2021   Hypothyroidism 12/15/2021   Complete heart block (HCC) 12/16/2018   AV block, Mobitz 2 12/16/2018   Bradycardia 09/21/2018   Diabetes (HCC) 09/21/2018   Chronic ITP (idiopathic  thrombocytopenia) (HCC) 02/04/2018   Thrombocytopenia (HCC) 10/28/2016   COPD, moderate (HCC) 04/26/2014   CAD (coronary artery disease) 08/14/2011   Dyslipidemia 08/14/2011   Tobacco abuse 08/14/2011   PCP:  Marguarite Arbour, MD Pharmacy:   [facility] [address] [place] - [address] [place] [address] Phone: [phone] Fax: [phone]  [facility] [address] [place] [address] Phone: [phone] Fax: [phone]  [facility] [place], [place] - [address] [place] [address] Phone: [phone] Fax: [phone]  [id number] [place], [place] - [address] [place] [address] Phone: [phone] Fax: [phone]     Social Determinants of Health (SDOH) Social History: SDOH Screenings   Food Insecurity: No Food Insecurity (09/02/2020)  Transportation Needs: No Transportation Needs (09/02/2020)  Depression (PHQ2-9): Low Risk  (09/02/2020)  Tobacco Use: High Risk (07/04/2023)   SDOH Interventions:     Readmission Risk Interventions    12/16/2021   10:51 AM  Readmission Risk Prevention Plan  Transportation Screening Complete  PCP or Specialist Appt within 5-7 Days Complete  Home Care Screening Complete  Medication Review (RN CM) Complete

## 2023-07-12 NOTE — Consult Note (Signed)
   [facility] Inpatient Consult   07/12/2023  Wally Legerski [facility] 10/02/1934 [id number]  [facility] [[facility]]  Accountable Care Organization [ACO] Patient: [employer]  Chart reviewed and reveals the patient is currently transitioning to Hospice facility for comfort measures currently noted seeking [facility] admission was currently under review at the time of this review.  Plan: Patient will have full case management services through Hospice and needs will be met at the hospice level of care. No [facility] Care Management is planned for transitional needs. Will sign off at transition from hospital.  For questions,   Charlesetta Shanks, RN BSN CCM [facility]  [phone] business mobile phone Toll free office [phone]  *Concierge Line  [phone] Fax number: [phone] [email or web address]

## 2023-07-12 NOTE — Discharge Summary (Addendum)
Stroke Discharge Summary  Patient ID: Madeline [name]   MRN: [id number]      DOB: [birth year]  Date of Admission: 07/03/2023 Date of Discharge: 07/12/2023  Attending Physician:  Stroke, Md, MD Consultant(s):    Palliative care and critical care  Patient's PCP:  Marguarite Arbour, MD  DISCHARGE PRIMARY DIAGNOSIS:   Stroke - Acute Large Left ACA territory infarct and punctate small infarcts left cerebellum and b/l hemishpere with Left A1 Occlusion s/p unsuccessful IR Etiology:  large vessel disease, ddx including cardiembolic source vs. Procedure related embolic shower   Secondary Diagnoses: Hypertension Hyperlipidemia Diabetes Type II Tobacco abuse  Allergies as of 07/12/2023       Reactions   Darvon [propoxyphene] Nausea And Vomiting   Sulfa Antibiotics Nausea And Vomiting        Medication List     STOP taking these medications    acetaminophen 325 MG tablet Commonly known as: TYLENOL Replaced by: acetaminophen 650 MG suppository   atorvastatin 20 MG tablet Commonly known as: LIPITOR   eltrombopag 25 MG tablet Commonly known as: PROMACTA   GVOKE HYPOPEN [place]   HumaLOG KwikPen 100 UNIT/ML KwikPen Generic drug: insulin lispro   insulin glargine 100 UNIT/ML Solostar Pen Commonly known as: LANTUS   Insulin Syringes (Disposable) U-100 0.3 ML Misc   levothyroxine 75 MCG tablet Commonly known as: SYNTHROID   lisinopril 20 MG tablet Commonly known as: ZESTRIL   Multivitamin Women 50+ Tabs       TAKE these medications    acetaminophen 650 MG suppository Commonly known as: TYLENOL Place 1 suppository (650 mg total) rectally every 4 (four) hours as needed for mild pain (or temp > 37.5 C (99.5 F)). Replaces: acetaminophen 325 MG tablet   antiseptic oral rinse Liqd Apply 15 mLs topically 2 (two) times daily.   diphenhydrAMINE 50 MG/ML injection Commonly known as: BENADRYL Inject 0.5 mLs (25 mg total) into the vein every 4 (four) hours as needed for  itching or sleep.   glycopyrrolate 0.2 MG/ML injection Commonly known as: ROBINUL Inject 1 mL (0.2 mg total) into the skin every 4 (four) hours as needed (excessive secretions).   haloperidol 2 MG/ML solution Commonly known as: HALDOL Place 1 mL (2 mg total) under the tongue every 6 (six) hours as needed for agitation (or delirium).   LORazepam 2 MG/ML concentrated solution Commonly known as: ATIVAN Place 0.5 mLs (1 mg total) under the tongue every 4 (four) hours as needed for anxiety, seizure or sleep.   morphine (PF) 2 MG/ML injection Inject 0.5 mLs (1 mg total) into the vein every 2 (two) hours as needed (or dyspnea).   ondansetron 4 MG disintegrating tablet Commonly known as: ZOFRAN-ODT Take 1 tablet (4 mg total) by mouth every 6 (six) hours as needed for nausea.   polyvinyl alcohol 1.4 % ophthalmic solution Commonly known as: LIQUIFILM TEARS Place 1 drop into both eyes 4 (four) times daily as needed for dry eyes.   scopolamine 1 MG/3DAYS Commonly known as: TRANSDERM-SCOP Place 1 patch (1.5 mg total) onto the skin every 3 (three) days. Start taking on: July 15, 2023        LABORATORY STUDIES CBC    Component Value Date/Time   WBC 7.7 07/09/2023 0821   RBC 3.00 (L) 07/09/2023 0821   HGB 8.8 (L) 07/09/2023 0821   HGB 11.3 12/09/2018 1308   HCT 28.1 (L) 07/09/2023 0821   HCT 33.1 (L) 12/09/2018 1308   PLT  444 (H) 07/09/2023 0821   PLT 52 (LL) 12/09/2018 1308   MCV 93.7 07/09/2023 0821   MCV 92 12/09/2018 1308   MCH 29.3 07/09/2023 0821   MCHC 31.3 07/09/2023 0821   RDW 13.0 07/09/2023 0821   RDW 12.2 12/09/2018 1308   LYMPHSABS 1.1 07/05/2023 0406   LYMPHSABS 2.0 12/09/2018 1308   MONOABS 0.8 07/05/2023 0406   EOSABS 0.0 07/05/2023 0406   EOSABS 0.2 12/09/2018 1308   BASOSABS 0.1 07/05/2023 0406   BASOSABS 0.0 12/09/2018 1308   CMP    Component Value Date/Time   NA 140 07/09/2023 0821   NA 143 12/09/2018 1308   K 3.1 (L) 07/09/2023 0821   CL 103  07/09/2023 0821   CO2 27 07/09/2023 0821   GLUCOSE 227 (H) 07/09/2023 0821   BUN 15 07/09/2023 0821   BUN 8 12/09/2018 1308   CREATININE 0.62 07/09/2023 0821   CREATININE 0.73 08/03/2016 1519   CALCIUM 8.1 (L) 07/09/2023 0821   PROT 6.4 (L) 07/03/2023 1402   ALBUMIN 3.3 (L) 07/03/2023 1402   AST 24 07/03/2023 1402   ALT 19 07/03/2023 1402   ALKPHOS 87 07/03/2023 1402   BILITOT 0.6 07/03/2023 1402   GFRNONAA >60 07/09/2023 0821   GFRAA >60 01/31/2020 1202   COAGS Lab Results  Component Value Date   INR 1.0 07/03/2023   INR 1.0 04/11/2023   INR 0.93 09/15/2017   Lipid Panel    Component Value Date/Time   CHOL 130 07/06/2023 0438   CHOL 134 08/07/2019 0857   TRIG 60 07/08/2023 1323   HDL 42 07/06/2023 0438   HDL 71 08/07/2019 0857   CHOLHDL 3.1 07/06/2023 0438   VLDL 19 07/06/2023 0438   LDLCALC 69 07/06/2023 0438   LDLCALC 51 08/07/2019 0857   HgbA1C  Lab Results  Component Value Date   HGBA1C 8.9 (H) 07/06/2023   Urine Drug Screen negative Alcohol Level    Component Value Date/Time   ETH <10 07/03/2023 1417     SIGNIFICANT DIAGNOSTIC STUDIES DG CHEST PORT 1 VIEW  Result Date: 07/07/2023 CLINICAL DATA:  Fever. EXAM: PORTABLE CHEST 1 VIEW COMPARISON:  July 04, 2023. FINDINGS: Stable cardiomediastinal silhouette. Endotracheal and nasogastric tubes are unchanged in position. Left-sided pacemaker is unchanged in position. Lungs are clear. Bony thorax is unremarkable. IMPRESSION: No active disease. Aortic Atherosclerosis (ICD10-I70.0). Electronically Signed   By: Lupita Raider M.D.   On: 07/07/2023 13:29   IR PERCUTANEOUS ART THROMBECTOMY/INFUSION INTRACRANIAL INC DIAG ANGIO  Result Date: 07/07/2023 INDICATION: New onset confusion, right-sided hemiplegia and aphasia. Occluded dominant left anterior cerebral artery A1 segment on CT angiogram of the head and neck. EXAM: 1. EMERGENT LARGE VESSEL OCCLUSION THROMBOLYSIS (anterior CIRCULATION) COMPARISON:  CT angiogram  of the head and neck July 04, 2023. MEDICATIONS: Ancef 2 g IV antibiotic was administered within 1 hour of the procedure. ANESTHESIA/SEDATION: General anesthesia. CONTRAST:  Omnipaque 300 approximately 80 mL. FLUOROSCOPY TIME:  Fluoroscopy Time: 128 minutes 12 seconds (2184 mGy). COMPLICATIONS: None immediate. TECHNIQUE: Following a full explanation of the procedure along with the potential associated complications, an informed witnessed consent was obtained. The risks of intracranial hemorrhage of 10%, worsening neurological deficit, ventilator dependency, death and inability to revascularize were all reviewed in detail with the patient's daughter. The patient was then put under general anesthesia by the Department of Anesthesiology at [facility]. The right groin was prepped and draped in the usual sterile fashion. Thereafter using modified Seldinger technique, transfemoral access into  the right common femoral artery was obtained without difficulty. Over an 0.035 inch guidewire an 8 French 25 cm Pinnacle sheath was inserted. Through this, and also over an 0.035 inch guidewire a combination of a Simmons 2 125 cm support catheter inside of an 087 95 cm balloon guide catheter was advanced to the aortic arch region, and selectively positioned in the left common carotid artery initially and then in the proximal 1/3 of the left internal carotid artery. FINDINGS: Left common carotid arteriogram demonstrates the left external carotid artery and its major branches to be widely patent. The left internal carotid artery at the bulb to the cranial skull base is widely patent with multiple U-shaped configuration of the mid and in the distal left ICA. The petrous, the cavernous and the supraclinoid left ICA remain patent. A left posterior communicating artery is seen opacifying the left posterior cerebral artery distribution. The left middle cerebral artery opacifies normally into the capillary and venous phases.  Occlusion of the dominant left A1 segment is seen proximally. PROCEDURE: Through the balloon guide catheter now in the mid stroke of the left ICA, a 055 Zoom aspiration catheter with a 162 cm microcatheter combination was advanced over an 018 inch micro guidewire with moderate J configuration to the supraclinoid left ICA. Using a torque device multiple attempts were made with the micro guidewire to engage A1 segment at its origin from the terminus with an acute angulation. Attempts were then made with an 016 double angled micro guidewire with which access could be obtained into the left A1 segment. Distal advancement into the A2 segment were met with significant resistance of the platform into the left internal carotid artery. With microcatheter in the left A1 A2 junction, a 3 mm x 20 mm Solitaire X retrieval device was then deployed in the usual manner with its distal end at the A1 A2 junction. At this time, the Zoom aspiration catheter was advanced to just inside of the A1 segment. Constant aspiration was then applied at the hub of the Zoom aspiration catheter with a pump for approximately 2 minutes in addition to the balloon guide in the mid cervical left ICA with proximal flow arrest. The Zoom aspiration catheter, the retrieval device and the microcatheter was then retrieved and removed. Following reversal of flow arrest, a control arteriogram performed through the balloon guide catheter demonstrated no significant change in the occluded left proximal A1 segment. Multiple attempts were then made using the same wire with different micro catheters and also different wires sizes 014, 018 and 024 without success in accessing through the occluded left anterior cerebral proximal A1 segment probably related to a hard intracranial plaque due to intracranial arteriosclerosis. After multiple unsuccessful attempts it was decided to stop. A final control arteriogram performed through the balloon guide catheter in the left  common carotid artery demonstrated no change in the intracranial extracranial ICA, with the left MCA and the left posterior communicating artery demonstrating wide patency unchanged. No noticeable change was noted of the occluded left ICA proximally. A flat panel CT of the brain finally demonstrated no evidence of intracranial hemorrhage. No evidence of flow attenuation was noted either. A 7 French Angio-Seal closure device was deployed at the right groin puncture site. Pulses remained present unchanged from prior to the procedure. Patient was left intubated and transferred to the neuro ICU. IMPRESSION: Status post endovascular treatment of occluded left ACA A1 segment with 1 pass with a 3 mm x 20 mm Solitaire X retrieval device and  contact aspiration without successful revascularization due to hard intracranial atherosclerotic plaque. PLAN: As per referring MD. Electronically Signed   By: Julieanne Cotton M.D.   On: 07/07/2023 08:33   IR CT Head Ltd  Result Date: 07/07/2023 INDICATION: New onset confusion, right-sided hemiplegia and aphasia. Occluded dominant left anterior cerebral artery A1 segment on CT angiogram of the head and neck. EXAM: 1. EMERGENT LARGE VESSEL OCCLUSION THROMBOLYSIS (anterior CIRCULATION) COMPARISON:  CT angiogram of the head and neck July 04, 2023. MEDICATIONS: Ancef 2 g IV antibiotic was administered within 1 hour of the procedure. ANESTHESIA/SEDATION: General anesthesia. CONTRAST:  Omnipaque 300 approximately 80 mL. FLUOROSCOPY TIME:  Fluoroscopy Time: 128 minutes 12 seconds (2184 mGy). COMPLICATIONS: None immediate. TECHNIQUE: Following a full explanation of the procedure along with the potential associated complications, an informed witnessed consent was obtained. The risks of intracranial hemorrhage of 10%, worsening neurological deficit, ventilator dependency, death and inability to revascularize were all reviewed in detail with the patient's daughter. The patient was then put  under general anesthesia by the Department of Anesthesiology at [facility]. The right groin was prepped and draped in the usual sterile fashion. Thereafter using modified Seldinger technique, transfemoral access into the right common femoral artery was obtained without difficulty. Over an 0.035 inch guidewire an 8 French 25 cm Pinnacle sheath was inserted. Through this, and also over an 0.035 inch guidewire a combination of a Simmons 2 125 cm support catheter inside of an 087 95 cm balloon guide catheter was advanced to the aortic arch region, and selectively positioned in the left common carotid artery initially and then in the proximal 1/3 of the left internal carotid artery. FINDINGS: Left common carotid arteriogram demonstrates the left external carotid artery and its major branches to be widely patent. The left internal carotid artery at the bulb to the cranial skull base is widely patent with multiple U-shaped configuration of the mid and in the distal left ICA. The petrous, the cavernous and the supraclinoid left ICA remain patent. A left posterior communicating artery is seen opacifying the left posterior cerebral artery distribution. The left middle cerebral artery opacifies normally into the capillary and venous phases. Occlusion of the dominant left A1 segment is seen proximally. PROCEDURE: Through the balloon guide catheter now in the mid stroke of the left ICA, a 055 Zoom aspiration catheter with a 162 cm microcatheter combination was advanced over an 018 inch micro guidewire with moderate J configuration to the supraclinoid left ICA. Using a torque device multiple attempts were made with the micro guidewire to engage A1 segment at its origin from the terminus with an acute angulation. Attempts were then made with an 016 double angled micro guidewire with which access could be obtained into the left A1 segment. Distal advancement into the A2 segment were met with significant resistance of the  platform into the left internal carotid artery. With microcatheter in the left A1 A2 junction, a 3 mm x 20 mm Solitaire X retrieval device was then deployed in the usual manner with its distal end at the A1 A2 junction. At this time, the Zoom aspiration catheter was advanced to just inside of the A1 segment. Constant aspiration was then applied at the hub of the Zoom aspiration catheter with a pump for approximately 2 minutes in addition to the balloon guide in the mid cervical left ICA with proximal flow arrest. The Zoom aspiration catheter, the retrieval device and the microcatheter was then retrieved and removed. Following reversal of flow  arrest, a control arteriogram performed through the balloon guide catheter demonstrated no significant change in the occluded left proximal A1 segment. Multiple attempts were then made using the same wire with different micro catheters and also different wires sizes 014, 018 and 024 without success in accessing through the occluded left anterior cerebral proximal A1 segment probably related to a hard intracranial plaque due to intracranial arteriosclerosis. After multiple unsuccessful attempts it was decided to stop. A final control arteriogram performed through the balloon guide catheter in the left common carotid artery demonstrated no change in the intracranial extracranial ICA, with the left MCA and the left posterior communicating artery demonstrating wide patency unchanged. No noticeable change was noted of the occluded left ICA proximally. A flat panel CT of the brain finally demonstrated no evidence of intracranial hemorrhage. No evidence of flow attenuation was noted either. A 7 French Angio-Seal closure device was deployed at the right groin puncture site. Pulses remained present unchanged from prior to the procedure. Patient was left intubated and transferred to the neuro ICU. IMPRESSION: Status post endovascular treatment of occluded left ACA A1 segment with 1 pass  with a 3 mm x 20 mm Solitaire X retrieval device and contact aspiration without successful revascularization due to hard intracranial atherosclerotic plaque. PLAN: As per referring MD. Electronically Signed   By: Julieanne Cotton M.D.   On: 07/07/2023 08:33   IR CT Head Ltd  Result Date: 07/07/2023 INDICATION: New onset confusion, right-sided hemiplegia and aphasia. Occluded dominant left anterior cerebral artery A1 segment on CT angiogram of the head and neck. EXAM: 1. EMERGENT LARGE VESSEL OCCLUSION THROMBOLYSIS (anterior CIRCULATION) COMPARISON:  CT angiogram of the head and neck July 04, 2023. MEDICATIONS: Ancef 2 g IV antibiotic was administered within 1 hour of the procedure. ANESTHESIA/SEDATION: General anesthesia. CONTRAST:  Omnipaque 300 approximately 80 mL. FLUOROSCOPY TIME:  Fluoroscopy Time: 128 minutes 12 seconds (2184 mGy). COMPLICATIONS: None immediate. TECHNIQUE: Following a full explanation of the procedure along with the potential associated complications, an informed witnessed consent was obtained. The risks of intracranial hemorrhage of 10%, worsening neurological deficit, ventilator dependency, death and inability to revascularize were all reviewed in detail with the patient's daughter. The patient was then put under general anesthesia by the Department of Anesthesiology at [facility]. The right groin was prepped and draped in the usual sterile fashion. Thereafter using modified Seldinger technique, transfemoral access into the right common femoral artery was obtained without difficulty. Over an 0.035 inch guidewire an 8 French 25 cm Pinnacle sheath was inserted. Through this, and also over an 0.035 inch guidewire a combination of a Simmons 2 125 cm support catheter inside of an 087 95 cm balloon guide catheter was advanced to the aortic arch region, and selectively positioned in the left common carotid artery initially and then in the proximal 1/3 of the left internal carotid  artery. FINDINGS: Left common carotid arteriogram demonstrates the left external carotid artery and its major branches to be widely patent. The left internal carotid artery at the bulb to the cranial skull base is widely patent with multiple U-shaped configuration of the mid and in the distal left ICA. The petrous, the cavernous and the supraclinoid left ICA remain patent. A left posterior communicating artery is seen opacifying the left posterior cerebral artery distribution. The left middle cerebral artery opacifies normally into the capillary and venous phases. Occlusion of the dominant left A1 segment is seen proximally. PROCEDURE: Through the balloon guide catheter now in the mid  stroke of the left ICA, a 055 Zoom aspiration catheter with a 162 cm microcatheter combination was advanced over an 018 inch micro guidewire with moderate J configuration to the supraclinoid left ICA. Using a torque device multiple attempts were made with the micro guidewire to engage A1 segment at its origin from the terminus with an acute angulation. Attempts were then made with an 016 double angled micro guidewire with which access could be obtained into the left A1 segment. Distal advancement into the A2 segment were met with significant resistance of the platform into the left internal carotid artery. With microcatheter in the left A1 A2 junction, a 3 mm x 20 mm Solitaire X retrieval device was then deployed in the usual manner with its distal end at the A1 A2 junction. At this time, the Zoom aspiration catheter was advanced to just inside of the A1 segment. Constant aspiration was then applied at the hub of the Zoom aspiration catheter with a pump for approximately 2 minutes in addition to the balloon guide in the mid cervical left ICA with proximal flow arrest. The Zoom aspiration catheter, the retrieval device and the microcatheter was then retrieved and removed. Following reversal of flow arrest, a control arteriogram  performed through the balloon guide catheter demonstrated no significant change in the occluded left proximal A1 segment. Multiple attempts were then made using the same wire with different micro catheters and also different wires sizes 014, 018 and 024 without success in accessing through the occluded left anterior cerebral proximal A1 segment probably related to a hard intracranial plaque due to intracranial arteriosclerosis. After multiple unsuccessful attempts it was decided to stop. A final control arteriogram performed through the balloon guide catheter in the left common carotid artery demonstrated no change in the intracranial extracranial ICA, with the left MCA and the left posterior communicating artery demonstrating wide patency unchanged. No noticeable change was noted of the occluded left ICA proximally. A flat panel CT of the brain finally demonstrated no evidence of intracranial hemorrhage. No evidence of flow attenuation was noted either. A 7 French Angio-Seal closure device was deployed at the right groin puncture site. Pulses remained present unchanged from prior to the procedure. Patient was left intubated and transferred to the neuro ICU. IMPRESSION: Status post endovascular treatment of occluded left ACA A1 segment with 1 pass with a 3 mm x 20 mm Solitaire X retrieval device and contact aspiration without successful revascularization due to hard intracranial atherosclerotic plaque. PLAN: As per referring MD. Electronically Signed   By: Julieanne Cotton M.D.   On: 07/07/2023 08:33   MR BRAIN WO CONTRAST  Result Date: 07/05/2023 CLINICAL DATA:  Stroke, follow-up. Left A1 occlusion status post unsuccessful attempted endovascular revascularization. EXAM: MRI HEAD WITHOUT CONTRAST MRA HEAD WITHOUT CONTRAST TECHNIQUE: Multiplanar, multi-echo pulse sequences of the brain and surrounding structures were acquired without intravenous contrast. Angiographic images of the Circle of Willis were  acquired using MRA technique without intravenous contrast. COMPARISON:  Head CT and CTA 07/04/2023 FINDINGS: MRI HEAD FINDINGS Brain: There is a large acute left ACA territory infarct involving the parasagittal frontal and parietal lobes including cingulate gyrus as well as the anterior left basal ganglia. There is associated petechial hemorrhage and cytotoxic edema with very mild mass effect, mainly on the frontal horn of the left lateral ventricle. There is no midline shift. Subcentimeter acute infarcts are also present in the left cerebellar hemisphere, left temporal and occipital lobes, and right parietal periventricular white matter. Scattered chronic cerebral  microhemorrhages are noted bilaterally. There are small chronic infarcts in the deep gray nuclei bilaterally, right occipital lobe, and both cerebellar hemispheres. Patchy T2 hyperintensities elsewhere in the cerebral white matter and pons are nonspecific but compatible with moderately extensive chronic small vessel ischemic disease. There is mild cerebral atrophy. Vascular: Reported below. Skull and upper cervical spine: No suspicious marrow lesion. Sinuses/Orbits: Bilateral cataract extraction. Paranasal sinuses and mastoid air cells are clear. Other: None. MRA HEAD FINDINGS Anterior circulation: The internal carotid arteries are patent from skull base to carotid termini without evidence of significant stenosis. The right A1 segment is hypoplastic. There is unchanged occlusion of the proximal left A1 segment with A2 reconstitution. The MCAs are patent without evidence of a proximal branch occlusion or flow limiting proximal stenosis. No aneurysm is identified. Posterior circulation: The intracranial vertebral arteries are widely patent to the basilar with the left being dominant. The basilar artery is widely patent. Patent SCA origins are seen bilaterally. There is a patent left posterior communicating artery. Both PCAs are patent without evidence of a  significant proximal stenosis. No aneurysm is identified. Anatomic variants: None. IMPRESSION: 1. Large acute left ACA territory infarct with petechial hemorrhage. Unchanged left A1 occlusion. 2. Subcentimeter acute infarcts in the left cerebellum and left greater than right posterior cerebral hemispheres. 3. Moderately extensive chronic small vessel ischemic disease with multiple chronic infarcts as above. Electronically Signed   By: Sebastian Ache M.D.   On: 07/05/2023 15:26   MR ANGIO HEAD WO CONTRAST  Result Date: 07/05/2023 CLINICAL DATA:  Stroke, follow-up. Left A1 occlusion status post unsuccessful attempted endovascular revascularization. EXAM: MRI HEAD WITHOUT CONTRAST MRA HEAD WITHOUT CONTRAST TECHNIQUE: Multiplanar, multi-echo pulse sequences of the brain and surrounding structures were acquired without intravenous contrast. Angiographic images of the Circle of Willis were acquired using MRA technique without intravenous contrast. COMPARISON:  Head CT and CTA 07/04/2023 FINDINGS: MRI HEAD FINDINGS Brain: There is a large acute left ACA territory infarct involving the parasagittal frontal and parietal lobes including cingulate gyrus as well as the anterior left basal ganglia. There is associated petechial hemorrhage and cytotoxic edema with very mild mass effect, mainly on the frontal horn of the left lateral ventricle. There is no midline shift. Subcentimeter acute infarcts are also present in the left cerebellar hemisphere, left temporal and occipital lobes, and right parietal periventricular white matter. Scattered chronic cerebral microhemorrhages are noted bilaterally. There are small chronic infarcts in the deep gray nuclei bilaterally, right occipital lobe, and both cerebellar hemispheres. Patchy T2 hyperintensities elsewhere in the cerebral white matter and pons are nonspecific but compatible with moderately extensive chronic small vessel ischemic disease. There is mild cerebral atrophy.  Vascular: Reported below. Skull and upper cervical spine: No suspicious marrow lesion. Sinuses/Orbits: Bilateral cataract extraction. Paranasal sinuses and mastoid air cells are clear. Other: None. MRA HEAD FINDINGS Anterior circulation: The internal carotid arteries are patent from skull base to carotid termini without evidence of significant stenosis. The right A1 segment is hypoplastic. There is unchanged occlusion of the proximal left A1 segment with A2 reconstitution. The MCAs are patent without evidence of a proximal branch occlusion or flow limiting proximal stenosis. No aneurysm is identified. Posterior circulation: The intracranial vertebral arteries are widely patent to the basilar with the left being dominant. The basilar artery is widely patent. Patent SCA origins are seen bilaterally. There is a patent left posterior communicating artery. Both PCAs are patent without evidence of a significant proximal stenosis. No aneurysm is identified. Anatomic  variants: None. IMPRESSION: 1. Large acute left ACA territory infarct with petechial hemorrhage. Unchanged left A1 occlusion. 2. Subcentimeter acute infarcts in the left cerebellum and left greater than right posterior cerebral hemispheres. 3. Moderately extensive chronic small vessel ischemic disease with multiple chronic infarcts as above. Electronically Signed   By: Sebastian Ache M.D.   On: 07/05/2023 15:26   ECHOCARDIOGRAM COMPLETE  Result Date: 07/05/2023    ECHOCARDIOGRAM REPORT   Patient Name:   Madeline [name] Date of Exam: 07/05/2023 Medical Rec #:  [id number]        Height:       62.0 in Accession #:    [id number]       Weight:       115.0 lb Date of Birth:  May 17, 1934         BSA:          1.511 m Patient Age:    88 years         BP:           159/64 mmHg Patient Gender: F                HR:           111 bpm. Exam Location:  Inpatient Procedure: 2D Echo, Cardiac Doppler and Color Doppler Indications:    Syncope R55  History:        Patient has  prior history of Echocardiogram examinations, most                 recent 04/12/2023. CHF, CAD, Pacemaker, Signs/Symptoms:Altered                 Mental Status; Risk Factors:Dyslipidemia, Current Smoker and                 Diabetes.  Sonographer:    Aron Baba Referring Phys: [phone] JULIE ANNE Mayford Knife  Sonographer Comments: Echo performed with patient supine and on artificial respirator. Image acquisition challenging due to patient behavioral factors., Image acquisition challenging due to uncooperative patient and Image acquisition challenging due to respiratory motion. IMPRESSIONS  1. Severe MAC with moderate MS (mean gradient 9 mmHg).  2. Left ventricular ejection fraction, by estimation, is >75%. The left ventricle has hyperdynamic function. The left ventricle has no regional wall motion abnormalities. There is mild concentric left ventricular hypertrophy. Left ventricular diastolic parameters are indeterminate. Elevated left atrial pressure.  3. Right ventricular systolic function is normal. The right ventricular size is normal. Tricuspid regurgitation signal is inadequate for assessing PA pressure.  4. The mitral valve is normal in structure. Trivial mitral valve regurgitation. Moderate mitral stenosis. Severe mitral annular calcification.  5. The aortic valve has an indeterminant number of cusps. Aortic valve regurgitation is not visualized. No aortic stenosis is present.  6. The inferior vena cava is normal in size with greater than 50% respiratory variability, suggesting right atrial pressure of 3 mmHg. FINDINGS  Left Ventricle: Left ventricular ejection fraction, by estimation, is >75%. The left ventricle has hyperdynamic function. The left ventricle has no regional wall motion abnormalities. The left ventricular internal cavity size was normal in size. There is mild concentric left ventricular hypertrophy. Left ventricular diastolic parameters are indeterminate. Elevated left atrial pressure. Right  Ventricle: The right ventricular size is normal. Right ventricular systolic function is normal. Tricuspid regurgitation signal is inadequate for assessing PA pressure. The tricuspid regurgitant velocity is 1.66 m/s, and with an assumed right atrial  pressure of 8 mmHg, the estimated right  ventricular systolic pressure is 19.0 mmHg. Left Atrium: Left atrial size was normal in size. Right Atrium: Right atrial size was normal in size. Pericardium: There is no evidence of pericardial effusion. Mitral Valve: The mitral valve is normal in structure. Severe mitral annular calcification. Trivial mitral valve regurgitation. Moderate mitral valve stenosis. Tricuspid Valve: The tricuspid valve is normal in structure. Tricuspid valve regurgitation is trivial. No evidence of tricuspid stenosis. Aortic Valve: The aortic valve has an indeterminant number of cusps. Aortic valve regurgitation is not visualized. No aortic stenosis is present. Pulmonic Valve: The pulmonic valve was not well visualized. Pulmonic valve regurgitation is not visualized. No evidence of pulmonic stenosis. Aorta: The aortic root is normal in size and structure. Venous: The inferior vena cava is normal in size with greater than 50% respiratory variability, suggesting right atrial pressure of 3 mmHg. IAS/Shunts: No atrial level shunt detected by color flow Doppler. Additional Comments: Severe MAC with moderate MS (mean gradient 9 mmHg).  LEFT VENTRICLE PLAX 2D LVIDd:         3.80 cm   Diastology LVIDs:         2.40 cm   LV e' medial:    9.46 cm/s LV PW:         1.00 cm   LV E/e' medial:  20.1 LV IVS:        0.80 cm   LV e' lateral:   14.60 cm/s LVOT diam:     1.60 cm   LV E/e' lateral: 13.0 LV SV:         33 LV SV Index:   22 LVOT Area:     2.01 cm  RIGHT VENTRICLE RV S prime:     13.10 cm/s TAPSE (M-mode): 1.2 cm LEFT ATRIUM           Index        RIGHT ATRIUM          Index LA diam:      3.10 cm 2.05 cm/m   RA Area:     9.86 cm LA Vol (A2C): 19.2 ml 12.71  ml/m  RA Volume:   19.40 ml 12.84 ml/m LA Vol (A4C): 29.3 ml 19.39 ml/m  AORTIC VALVE LVOT Vmax:   107.00 cm/s LVOT Vmean:  71.300 cm/s LVOT VTI:    0.163 m  AORTA Ao Root diam: 3.10 cm Ao Asc diam:  3.00 cm MITRAL VALVE                TRICUSPID VALVE MV Area (PHT): 4.17 cm     TR Peak grad:   11.0 mmHg MV Decel Time: 182 msec     TR Vmax:        166.00 cm/s MR Peak grad: 8.6 mmHg MR Vmax:      147.00 cm/s   SHUNTS MV E velocity: 190.00 cm/s  Systemic VTI:  0.16 m                             Systemic Diam: 1.60 cm Olga Millers MD Electronically signed by Olga Millers MD Signature Date/Time: 07/05/2023/10:28:12 AM    Final    Overnight EEG with video  Result Date: 07/05/2023 Charlsie Quest, MD     07/05/2023  9:47 AM Patient Name: Madeline [name] MRN: [id number] Epilepsy Attending: Charlsie Quest Referring Physician/Provider: Jefferson Fuel, MD Duration:07/04/2023 [phone] to 07/05/2023 0730 Patient history: 89yo F with ams getting eeg to evaluate  for seizure Level of alertness: comatose AEDs during EEG study: Propofol Technical aspects: This EEG study was done with scalp electrodes positioned according to the 10-20 International system of electrode placement. Electrical activity was reviewed with band pass filter of 1-70Hz , sensitivity of 7 uV/mm, display speed of 49mm/sec with a 60Hz  notched filter applied as appropriate. EEG data were recorded continuously and digitally stored.  Video monitoring was available and reviewed as appropriate. Description: EEG showed near continuous generalized and lateralized left hemisphere polymorphic sharply contoured 3 to 6 Hz theta delta slowing with overriding generalized amplitude 13 to 15 Hz beta activity.  Sharply contoured waves with triphasic morphology also noted in right hemisphere, maximal right frontal region, predominantly when awake/stimulated.  Hyperventilation and photic stimulation were not performed.   ABNORMALITY - Continuous slow, generalized and  lateralized left hemisphere IMPRESSION: This study is suggestive of cortical dysfunction in left hemisphere likely secondary to underlying structural abnormality.  Additionally there is moderate to severe diffuse encephalopathy, nonspecific etiology.  No seizures or definite epileptiform discharges were seen during the study. Charlsie Quest   DG CHEST PORT 1 VIEW  Result Date: 07/04/2023 CLINICAL DATA:  Endotracheal tube placement. EXAM: PORTABLE CHEST 1 VIEW COMPARISON:  07/03/2023 4:05 p.m. FINDINGS: Left-sided pacemaker unchanged. Endotracheal tube has tip 3.2 cm above the carina. Nasogastric tube courses into the stomach and off the film as tip is not visualized. Lungs are adequately inflated without focal airspace consolidation or effusion. No pneumothorax. Cardiomediastinal silhouette and remainder of the exam is unchanged. IMPRESSION: 1. No acute cardiopulmonary disease. 2. Endotracheal tube with tip 3.2 cm above the carina. Nasogastric tube courses into the stomach and off the film as tip is not visualized. Electronically Signed   By: Elberta Fortis M.D.   On: 07/04/2023 16:04   DG Abd 1 View  Result Date: 07/04/2023 CLINICAL DATA:  OG tube placement EXAM: ABDOMEN - 1 VIEW COMPARISON:  None Available. FINDINGS: Esophagogastric tube with tip and side port below the diaphragm. Nonobstructive pattern of included bowel gas. Excreted contrast in the urinary tract. IMPRESSION: Esophagogastric tube with tip and side port below the diaphragm. Electronically Signed   By: Jearld Lesch M.D.   On: 07/04/2023 16:03   CT ANGIO HEAD NECK W WO CM W PERF (CODE STROKE)  Result Date: 07/04/2023 CLINICAL DATA:  Right hemiplegia. EXAM: CT ANGIOGRAPHY HEAD AND NECK CT PERFUSION BRAIN TECHNIQUE: Multidetector CT imaging of the head and neck was performed using the standard protocol during bolus administration of intravenous contrast. Multiplanar CT image reconstructions and MIPs were obtained to evaluate the vascular  anatomy. Carotid stenosis measurements (when applicable) are obtained utilizing NASCET criteria, using the distal internal carotid diameter as the denominator. Multiphase CT imaging of the brain was performed following IV bolus contrast injection. Subsequent parametric perfusion maps were calculated using RAPID software. RADIATION DOSE REDUCTION: This exam was performed according to the departmental dose-optimization program which includes automated exposure control, adjustment of the mA and/or kV according to patient size and/or use of iterative reconstruction technique. CONTRAST:  Contrast dose is not known on this in progress study. COMPARISON:  Head CT from earlier today FINDINGS: CTA NECK FINDINGS Aortic arch: Atheromatous plaque with 2 vessel branching Right carotid system: Mild for age calcified plaque at the bifurcation without stenosis or ulceration Left carotid system: Mild to moderate calcified plaque at the bifurcation without stenosis or ulceration. ICA tortuosity below the skull base. The right ICA is smaller than the left, likely due to circle-of-Willis  variant. Vertebral arteries: Proximal subclavian atherosclerosis with 90% narrowing on the left and 30% narrowing on the right. The vertebral arteries are smoothly contoured and widely patent throughout the neck. The left vertebral artery is dominant. Skeleton: No acute finding Other neck: No acute finding Upper chest: Biapical pleural based scarring.  No acute finding Review of the MIP images confirms the above findings CTA HEAD FINDINGS Anterior circulation: Atheromatous calcification of the carotid siphons. There is a hypoplastic right A1 segment which is very diminutive. Abrupt cut off of the left A1 segment although there is downstream filling. No MCA branch occlusion is seen. No vascular malformation or aneurysm. Posterior circulation: The vertebral and basilar arteries are widely patent. No branch occlusion, beading, or aneurysm. Venous sinuses:  Unremarkable Anatomic variants: Unremarkable Review of the MIP images confirms the above findings CT Brain Perfusion Findings: CBF (<30%) Volume: 0mL Perfusion (Tmax>6.0s) volume: 5mL-but in a location consistent with artifact. The left ACA territory as highlighted using 18 max of 4 seconds IMPRESSION: 1. Abrupt cut off at the left A1 segment, acute appearing, with downstream reconstitution. The right A1 is hypoplastic. 2. 90% narrowing of the proximal left subclavian artery due to atheromatous plaque. No other flow reducing stenosis in the neck. 3. No infarct or penumbra by standard perfusion thresholds. There is some delayed perfusion to the left ACA territory using a 4 second threshold. Electronically Signed   By: Tiburcio Pea M.D.   On: 07/04/2023 09:48   CT HEAD CODE STROKE WO CONTRAST  Result Date: 07/04/2023 CLINICAL DATA:  Code stroke.  Right-sided hemiplegia. EXAM: CT HEAD WITHOUT CONTRAST TECHNIQUE: Contiguous axial images were obtained from the base of the skull through the vertex without intravenous contrast. RADIATION DOSE REDUCTION: This exam was performed according to the departmental dose-optimization program which includes automated exposure control, adjustment of the mA and/or kV according to patient size and/or use of iterative reconstruction technique. COMPARISON:  Head CT from yesterday FINDINGS: Brain: No evidence of acute infarction, hemorrhage, hydrocephalus, extra-axial collection or mass lesion/mass effect. Low-density in the cerebral white matter that is extensive. Chronic lacunar infarcts in the deep gray nuclei. Vascular: No hyperdense vessel or unexpected calcification. Skull: Normal. Negative for fracture or focal lesion. Sinuses/Orbits: No acute finding. Other: Prelim sent in epic chat ASPECTS [facility] Stroke Program Early CT Score) - Ganglionic level infarction (caudate, lentiform nuclei, internal capsule, insula, M1-M3 cortex): 7 - Supraganglionic infarction (M4-M6 cortex): 3  Total score (0-10 with 10 being normal): 10 IMPRESSION: 1. Stable head CT.  No acute finding. 2. Extensive chronic small vessel ischemia. Electronically Signed   By: Tiburcio Pea M.D.   On: 07/04/2023 09:36   DG Chest Portable 1 View  Result Date: 07/03/2023 CLINICAL DATA:  Syncope. EXAM: PORTABLE CHEST 1 VIEW COMPARISON:  Chest radiograph dated 04/30/2023. FINDINGS: The heart is mildly enlarged. Vascular calcifications are seen in the aortic arch. A left subclavian approach cardiac device is redemonstrated. The lungs are clear. There is no pleural effusion or pneumothorax. Degenerative changes are seen in the spine. IMPRESSION: Mild cardiomegaly.  No acute cardiopulmonary process. Electronically Signed   By: Romona Curls M.D.   On: 07/03/2023 16:29   CT CERVICAL SPINE WO CONTRAST  Result Date: 07/03/2023 CLINICAL DATA:  Fall.  Altered mental status.  Found down. EXAM: CT CERVICAL SPINE WITHOUT CONTRAST TECHNIQUE: Multidetector CT imaging of the cervical spine was performed without intravenous contrast. Multiplanar CT image reconstructions were also generated. RADIATION DOSE REDUCTION: This exam was performed according to  the departmental dose-optimization program which includes automated exposure control, adjustment of the mA and/or kV according to patient size and/or use of iterative reconstruction technique. COMPARISON:  CT cervical spine 04/16/2022. FINDINGS: Alignment: Unchanged 3 mm retrolisthesis of C4 on C5. No traumatic malalignment. Skull base and vertebrae: No acute fracture. Normal craniocervical junction. No suspicious bone lesions. Multilevel degenerative endplate changes. Soft tissues and spinal canal: No prevertebral fluid or swelling. No visible canal hematoma. Disc levels: Multilevel cervical spondylosis, worst at C4-5, where there is mild spinal canal stenosis. Upper chest: No acute findings. Other: Atherosclerotic calcifications of the carotid bulbs. IMPRESSION: 1. No acute fracture  or traumatic malalignment of the cervical spine. 2. Multilevel cervical spondylosis, worst at C4-5, where there is mild spinal canal stenosis. Electronically Signed   By: Orvan Falconer M.D.   On: 07/03/2023 14:56   CT HEAD CODE STROKE WO CONTRAST  Result Date: 07/03/2023 CLINICAL DATA:  Code stroke. Neuro deficit, acute, stroke suspected. Right-sided weakness. Aphasia. EXAM: CT HEAD WITHOUT CONTRAST TECHNIQUE: Contiguous axial images were obtained from the base of the skull through the vertex without intravenous contrast. RADIATION DOSE REDUCTION: This exam was performed according to the departmental dose-optimization program which includes automated exposure control, adjustment of the mA and/or kV according to patient size and/or use of iterative reconstruction technique. COMPARISON:  Head CT 04/30/2023. FINDINGS: Brain: No acute hemorrhage. Stable mild chronic small-vessel disease with old lacunar infarcts in the left caudate and right thalamus. Cortical gray-white differentiation is otherwise preserved. Prominence of the ventricles and sulci within expected range for age. No hydrocephalus or extra-axial collection. No mass effect or midline shift. Vascular: No hyperdense vessel or unexpected calcification. Skull: No calvarial fracture or suspicious bone lesion. Skull base is unremarkable. Sinuses/Orbits: No acute finding. Other: None. ASPECTS [facility] Stroke Program Early CT Score) - Ganglionic level infarction (caudate, lentiform nuclei, internal capsule, insula, M1-M3 cortex): 7 - Supraganglionic infarction (M4-M6 cortex): 3 Total score (0-10 with 10 being normal): 10 IMPRESSION: 1. No acute intracranial hemorrhage or evidence of acute large vessel territory infarct. ASPECT score is 10. 2. Stable mild chronic small-vessel disease with old lacunar infarcts in the left caudate and right thalamus. Code stroke imaging results were communicated on 07/03/2023 at 2:36 pm to provider Dr. Selina Cooley via secure text  paging. Electronically Signed   By: Orvan Falconer M.D.   On: 07/03/2023 14:36       HISTORY OF PRESENT ILLNESS 89 y.o. patient with history of diabetes, COPD, diabetic gastroparesis, hyperlipidemia, hypertension, pacemaker and hypothyroidism was admitted after being found down at home with mild left leg weakness, confusion and dysarthria.  HOSPITAL COURSE Patient's initial head CT was negative, and on 8/18, she had a neurochange with NIHSS of 20.  Repeat CT angiogram showed left A1 occlusion and she was taken to interventional radiology for thrombectomy.  This was unsuccessful.  On 8/19, brain MRI showed acute left right ACA territory infarct with petechial hemorrhage.  Goals of care discussions were had with patient's family, and she was extubated on 8/24.  On 8/25, she was transitioned to comfort care and on 8/26, she was transferred to inpatient hospice.   Stroke - Acute Large Left ACA territory infarct and punctate small infarcts left cerebellum and b/l hemishpere with Left A1 Occlusion s/p unsuccessful IR Etiology:  large vessel disease, ddx including cardiembolic source vs. Procedure related embolic shower  Code Stroke CT head 8/18: Stable head CT. No acute finding.  CTA head & neck Abrupt cut of at  left A1 segment, acute-appearing.  CT perfusion  No infarct or penumbra by standard perfusion thresholds. But on TTP > 4.0, penumbra shown on left ACA territory MRI /MRA Large acute left ACA territory infarct with petechial hemorrhage.  Subcentimeter acute infarcts in the left cerebellum and left greater than right posterior cerebral hemispheres. Unchanged left A1 occlusion  2D Echo: Severe MAC with moderate MS, EF >75%, Mild LVH, No shunt.  Pacemaker interrogation no afib EEG left frontotemporal focal area of cerebral dysfunction in the setting of more generalized region of cerebral dysfunction.  LDL 69 HgbA1c 8.9 UDS neg VTE prophylaxis - lovenox No antithrombotic prior to admission, no  anti thrombotic as she has no oral access  Therapy recommendations:  no follow up Disposition: Palliative care   Acute Respiratory Failure, post-procedure CCM Management, appreciate their assistance Extubated 8/24   S/p Pacemaker for 3rd Degree heart block Coronary Artery Disease / STEMI s/p stenting 2012 Hypertension Home meds:  lisinopril 20mg , on hold Pacemaker placed 2020, [employer] dual-lead No afib found  BP goal < 180/105 given unsuccessful IR Long term BP goal normotensive   Hyperlipidemia Home meds:  lipitor 20mg  LDL 69, goal < 70   Diabetes type II, uncontrolled Home meds:  lantus, humalog Discontinue checks- comfort care   Tobacco Abuse Patient smokes 1 pack per day for 50 years Nicotine replacement therapy provided   Other stroke risk factors Advanced age   Other acute issues Hx of ITP on Promacta, follows with oncology, platelet [phone]->417->395 Leukocytosis WBC 7.2->11.7 Fever Tmax 101.6, UA WBC 6-10, CXR neg, no leukocytosis Anemia, Hb 10.7->9.1->8.6  RN Pressure Injury Documentation:     DISCHARGE EXAM  PHYSICAL EXAM General: Drowsy elderly patient in no acute distress, appearing comfortable Respiratory:  Regular, unlabored respirations on room air GI: Abdomen soft and nontender  NEURO:  Mental Status: Drowsy but awakens to voice Speech/Language: Nonverbal Tone: is normal and bulk is normal Sensation-appears intact to light touch Gait- deferred   Discharge Diet       Diet   Diet regular Room service appropriate? Yes; Fluid consistency: Thin   liquids  DISCHARGE PLAN Disposition:  Inpatient hospice Follow up with hospice physician  32 minutes were spent preparing discharge.  Cortney E Ernestina Columbia , MSN, AGACNP-BC [facility] See Amion for schedule and pager information 07/12/2023 3:29 PM   I have personally obtained history,examined this patient, reviewed notes, independently viewed imaging studies, participated in  medical decision making and plan of care.ROS completed by me personally and pertinent positives fully documented  I have made any additions or clarifications directly to the above note. Agree with note above.  Patient presented with a large ACA infarct and prognosis for making significant improvement and living independently was poor.  Family knew the patient's wishes and made her DNR and comfort care measures only and she was transferred to hospice upon family request.  Delia Heady, MD Medical Director [facility] Pager: [phone] 07/12/2023 5:12 PM

## 2023-07-12 NOTE — Plan of Care (Signed)
Problem: Coping: Goal: Ability to adjust to condition or change in health will improve Outcome: Not Progressing   Problem: Fluid Volume: Goal: Ability to maintain a balanced intake and output will improve Outcome: Not Progressing   Problem: Health Behavior/Discharge Planning: Goal: Ability to manage health-related needs will improve Outcome: Not Progressing   Problem: Metabolic: Goal: Ability to maintain appropriate glucose levels will improve Outcome: Not Progressing   Problem: Nutritional: Goal: Maintenance of adequate nutrition will improve Outcome: Not Progressing Goal: Progress toward achieving an optimal weight will improve Outcome: Not Progressing   Problem: Skin Integrity: Goal: Risk for impaired skin integrity will decrease Outcome: Not Progressing   Problem: Tissue Perfusion: Goal: Adequacy of tissue perfusion will improve Outcome: Not Progressing   Problem: Education: Goal: Knowledge of secondary prevention will improve (MUST DOCUMENT ALL) Outcome: Not Progressing Goal: Knowledge of patient specific risk factors will improve Loraine Leriche N/A or DELETE if not current risk factor) Outcome: Not Progressing   Problem: Ischemic Stroke/TIA Tissue Perfusion: Goal: Complications of ischemic stroke/TIA will be minimized Outcome: Not Progressing   Problem: Health Behavior/Discharge Planning: Goal: Goals will be collaboratively established with patient/family Outcome: Not Progressing   Problem: Self-Care: Goal: Ability to participate in self-care as condition permits will improve Outcome: Not Progressing Goal: Verbalization of feelings and concerns over difficulty with self-care will improve Outcome: Not Progressing Goal: Ability to communicate needs accurately will improve Outcome: Not Progressing   Problem: Nutrition: Goal: Risk of aspiration will decrease Outcome: Not Progressing Goal: Dietary intake will improve Outcome: Not Progressing   Problem:  Education: Goal: Understanding of CV disease, CV risk reduction, and recovery process will improve Outcome: Not Progressing   Problem: Activity: Goal: Ability to return to baseline activity level will improve Outcome: Not Progressing   Problem: Cardiovascular: Goal: Ability to achieve and maintain adequate cardiovascular perfusion will improve Outcome: Not Progressing Goal: Vascular access site(s) Level 0-1 will be maintained Outcome: Not Progressing   Problem: Health Behavior/Discharge Planning: Goal: Ability to safely manage health-related needs after discharge will improve Outcome: Not Progressing   Problem: Activity: Goal: Ability to tolerate increased activity will improve Outcome: Not Progressing   Problem: Respiratory: Goal: Ability to maintain a clear airway and adequate ventilation will improve Outcome: Not Progressing   Problem: Role Relationship: Goal: Method of communication will improve Outcome: Not Progressing   Problem: Safety: Goal: Non-violent Restraint(s) Outcome: Not Progressing   Problem: Education: Goal: Knowledge of General Education information will improve Description: Including pain rating scale, medication(s)/side effects and non-pharmacologic comfort measures Outcome: Not Progressing   Problem: Health Behavior/Discharge Planning: Goal: Ability to manage health-related needs will improve Outcome: Not Progressing   Problem: Clinical Measurements: Goal: Ability to maintain clinical measurements within normal limits will improve Outcome: Not Progressing Goal: Will remain free from infection Outcome: Not Progressing Goal: Diagnostic test results will improve Outcome: Not Progressing Goal: Respiratory complications will improve Outcome: Not Progressing Goal: Cardiovascular complication will be avoided Outcome: Not Progressing   Problem: Activity: Goal: Risk for activity intolerance will decrease Outcome: Not Progressing   Problem:  Nutrition: Goal: Adequate nutrition will be maintained Outcome: Not Progressing   Problem: Coping: Goal: Level of anxiety will decrease Outcome: Not Progressing   Problem: Elimination: Goal: Will not experience complications related to bowel motility Outcome: Not Progressing Goal: Will not experience complications related to urinary retention Outcome: Not Progressing   Problem: Pain Managment: Goal: General experience of comfort will improve Outcome: Not Progressing   Problem: Safety: Goal: Ability to remain free  from injury will improve Outcome: Not Progressing   Problem: Skin Integrity: Goal: Risk for impaired skin integrity will decrease Outcome: Not Progressing   Problem: Education: Goal: Knowledge of the prescribed therapeutic regimen will improve Outcome: Not Progressing   Problem: Coping: Goal: Ability to identify and develop effective coping behavior will improve Outcome: Not Progressing   Problem: Clinical Measurements: Goal: Quality of life will improve Outcome: Not Progressing   Problem: Respiratory: Goal: Verbalizations of increased ease of respirations will increase Outcome: Not Progressing   Problem: Role Relationship: Goal: Family's ability to cope with current situation will improve Outcome: Not Progressing Goal: Ability to verbalize concerns, feelings, and thoughts to partner or family member will improve Outcome: Not Progressing   Problem: Pain Management: Goal: Satisfaction with pain management regimen will improve Outcome: Not Progressing

## 2023-07-12 NOTE — TOC Transition Note (Signed)
Transition of Care [facility]) - CM/SW Discharge Note   Patient Details  Name: Madeline [name] MRN: [id number] Date of Birth: [birth year]  [facility] CM/SW Contact:  Carley Hammed, LCSW Phone Number: 07/12/2023, 4:16 PM   Clinical Narrative:     Pt to be transported to [facility] via [place]. Nurse to call report to [phone].   Final next level of care: Hospice Medical Facility Barriers to Discharge: Barriers Resolved   Patient Goals and CMS Choice CMS Medicare.gov Compare Post Acute Care list provided to:: Patient Represenative (must comment) Choice offered to / list presented to : Adult Children, Spouse  Discharge Placement                Patient chooses bed at:  [facility]) Patient to be transferred to facility by: [facility] Name of family member notified: [name] Patient and family notified of of transfer: 07/12/23  Discharge Plan and Services Additional resources added to the After Visit Summary for   In-house Referral: Clinical Social Work, Hospice / Palliative Care   Post Acute Care Choice: Residential Hospice Bed                               Social Determinants of Health (SDOH) Interventions SDOH Screenings   Food Insecurity: No Food Insecurity (09/02/2020)  Transportation Needs: No Transportation Needs (09/02/2020)  Depression (PHQ2-9): Low Risk  (09/02/2020)  Tobacco Use: High Risk (07/04/2023)     Readmission Risk Interventions    12/16/2021   10:51 AM  Readmission Risk Prevention Plan  Transportation Screening Complete  PCP or Specialist Appt within 5-7 Days Complete  Home Care Screening Complete  Medication Review (RN CM) Complete

## 2023-07-12 NOTE — Progress Notes (Signed)
Patient ID: Madeline [name], female   DOB: [birth year], 89 y.o.   MRN: [id number]    Progress Note from the Palliative Medicine Team at [facility]   Patient Name: Madeline [name]        Date: 07/12/2023 DOB: [birth year]  Age: 89 y.o. MRN#: [id number] Attending Physician: Stroke, Md, MD Primary Care Physician: Marguarite Arbour, MD Admit Date: 07/03/2023   HPI/ Brief Hospital Review  89 y.o. female  with past medical history of history of diabetes, COPD, diabetic gastroparesis, hyperlipidemia, hypertension, pacemaker and hypothyroidism admitted on 07/03/2023 for neurological work up after being found down between her bed and bedside table with mild L leg weakness, confusion, and mild dysarthria.     On 07/04/2023 she had a significant change in her neurological status. 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 She went for endovascular treatment and interventional radiology but revascularization was not possible due to hard atherosclerotic plaque.    On 07/09/23 she was extubated with no plan for reintubation. Family made decision to shift focus of care to comfort and dignity allowing for a natural death    Subjective   This NP assessed patient at the bedside as a follow up for palliative medicine needs and emotional  support.  Husband and two daughter and pastor  are at the bedside   Patient is unresponsive to gentle touch and verbal stimuli, she appears comfortable .      Continued education regarding current medical situation specific to family's decision to focus on comfort and dignity allowing for natural death.  All verbalize understanding on the limited prognosis and hope is for comfort at end-of-life.  Education offered on the natural trajectory and expectations at end-of-life.  Family hope for residential hospice for end-of-life care.   Education offered on hospice benefit; philosophy and eligibility.  Family expressed interest in  residential hospice services at [facility].  I will place referral.     Created space for husband to share his love story of his 91 year old marriage his wife.  They have 4 children.  He expresses intense love and appreciation for his wife.   Education offered today regarding  the importance of continued conversation with family and their  medical providers regarding overall plan of care and treatment options,  ensuring decisions are within the context of the patients values and GOCs.  Questions and concerns addressed   Discussed with primary team and nursing staff   Time:  35  minutes  Detailed review of medical records ( labs, imaging, vital signs), medically appropriate exam ( MS, skin, cardia,  resp)   discussed with treatment team, counseling and education to patient, family, staff, documenting clinical information, medication management, coordination of care    Lorinda Creed NP  Palliative Medicine Team Team Phone # [phone] Pager [phone]

## 2023-07-12 NOTE — Progress Notes (Signed)
Nutrition Brief Note  Chart reviewed. Pt now transitioning to comfort care.  No further nutrition interventions planned at this time.  Please re-consult as needed.   Abby M Coggins, RD, LDN, CNSC.   

## 2023-07-18 DEATH — deceased

## 2023-09-10 ENCOUNTER — Ambulatory Visit: Payer: HMO

## 2023-09-30 ENCOUNTER — Inpatient Hospital Stay: Payer: PPO

## 2023-10-07 ENCOUNTER — Inpatient Hospital Stay: Payer: PPO | Admitting: Oncology

## 2023-12-10 ENCOUNTER — Ambulatory Visit: Payer: HMO

## 2024-03-10 ENCOUNTER — Ambulatory Visit: Payer: HMO

## 2024-06-09 ENCOUNTER — Ambulatory Visit: Payer: HMO

## 2024-09-08 ENCOUNTER — Ambulatory Visit: Payer: HMO
